# Patient Record
Sex: Female | Born: 1945 | Race: White | Hispanic: No | Marital: Married | Smoking: Former smoker
Health system: Southern US, Community
[De-identification: ages and names within clinical notes are randomized; demographics above are authoritative.]

## PROBLEM LIST (undated history)

## (undated) DIAGNOSIS — Z8719 Personal history of other diseases of the digestive system: Secondary | ICD-10-CM

## (undated) DIAGNOSIS — K219 Gastro-esophageal reflux disease without esophagitis: Secondary | ICD-10-CM

## (undated) DIAGNOSIS — F1010 Alcohol abuse, uncomplicated: Secondary | ICD-10-CM

## (undated) DIAGNOSIS — D689 Coagulation defect, unspecified: Secondary | ICD-10-CM

## (undated) DIAGNOSIS — K222 Esophageal obstruction: Secondary | ICD-10-CM

## (undated) DIAGNOSIS — K449 Diaphragmatic hernia without obstruction or gangrene: Secondary | ICD-10-CM

## (undated) DIAGNOSIS — I2699 Other pulmonary embolism without acute cor pulmonale: Secondary | ICD-10-CM

## (undated) DIAGNOSIS — I739 Peripheral vascular disease, unspecified: Secondary | ICD-10-CM

## (undated) DIAGNOSIS — Z9981 Dependence on supplemental oxygen: Secondary | ICD-10-CM

## (undated) DIAGNOSIS — F341 Dysthymic disorder: Secondary | ICD-10-CM

## (undated) DIAGNOSIS — K529 Noninfective gastroenteritis and colitis, unspecified: Secondary | ICD-10-CM

## (undated) DIAGNOSIS — I471 Supraventricular tachycardia: Secondary | ICD-10-CM

## (undated) DIAGNOSIS — C50919 Malignant neoplasm of unspecified site of unspecified female breast: Secondary | ICD-10-CM

## (undated) DIAGNOSIS — F419 Anxiety disorder, unspecified: Secondary | ICD-10-CM

## (undated) DIAGNOSIS — I82409 Acute embolism and thrombosis of unspecified deep veins of unspecified lower extremity: Secondary | ICD-10-CM

## (undated) DIAGNOSIS — I7090 Unspecified atherosclerosis: Secondary | ICD-10-CM

## (undated) DIAGNOSIS — J9610 Chronic respiratory failure, unspecified whether with hypoxia or hypercapnia: Secondary | ICD-10-CM

## (undated) DIAGNOSIS — E785 Hyperlipidemia, unspecified: Secondary | ICD-10-CM

## (undated) HISTORY — DX: Unspecified atherosclerosis: I70.90

## (undated) HISTORY — DX: Coagulation defect, unspecified: D68.9

## (undated) HISTORY — DX: Diaphragmatic hernia without obstruction or gangrene: K44.9

## (undated) HISTORY — DX: Dysthymic disorder: F34.1

## (undated) HISTORY — DX: Esophageal obstruction: K22.2

## (undated) HISTORY — DX: Noninfective gastroenteritis and colitis, unspecified: K52.9

## (undated) HISTORY — DX: Peripheral vascular disease, unspecified: I73.9

## (undated) HISTORY — PX: ESOPHAGOGASTRODUODENOSCOPY (EGD) WITH ESOPHAGEAL DILATION: SHX5812

## (undated) HISTORY — DX: Malignant neoplasm of unspecified site of unspecified female breast: C50.919

## (undated) HISTORY — DX: Alcohol abuse, uncomplicated: F10.10

## (undated) HISTORY — DX: Hyperlipidemia, unspecified: E78.5

## (undated) HISTORY — DX: Chronic respiratory failure, unspecified whether with hypoxia or hypercapnia: J96.10

---

## 1977-12-08 HISTORY — PX: APPENDECTOMY: SHX54

## 1977-12-08 HISTORY — PX: CHOLECYSTECTOMY: SHX55

## 1979-08-09 HISTORY — PX: TUBAL LIGATION: SHX77

## 1989-08-08 HISTORY — PX: CARDIAC CATHETERIZATION: SHX172

## 2000-09-24 ENCOUNTER — Ambulatory Visit (HOSPITAL_COMMUNITY): Admission: RE | Admit: 2000-09-24 | Discharge: 2000-09-24 | Payer: Self-pay | Admitting: *Deleted

## 2000-09-24 ENCOUNTER — Encounter: Payer: Self-pay | Admitting: *Deleted

## 2000-12-08 DIAGNOSIS — C50919 Malignant neoplasm of unspecified site of unspecified female breast: Secondary | ICD-10-CM

## 2000-12-08 HISTORY — DX: Malignant neoplasm of unspecified site of unspecified female breast: C50.919

## 2000-12-31 ENCOUNTER — Other Ambulatory Visit: Admission: RE | Admit: 2000-12-31 | Discharge: 2000-12-31 | Payer: Self-pay | Admitting: Radiology

## 2001-01-05 ENCOUNTER — Encounter: Admission: RE | Admit: 2001-01-05 | Discharge: 2001-04-05 | Payer: Self-pay | Admitting: Radiation Oncology

## 2001-01-08 ENCOUNTER — Ambulatory Visit (HOSPITAL_COMMUNITY): Admission: RE | Admit: 2001-01-08 | Discharge: 2001-01-08 | Payer: Self-pay | Admitting: Surgery

## 2001-01-08 ENCOUNTER — Encounter (INDEPENDENT_AMBULATORY_CARE_PROVIDER_SITE_OTHER): Payer: Self-pay

## 2001-01-08 HISTORY — PX: BREAST LUMPECTOMY: SHX2

## 2001-04-06 ENCOUNTER — Ambulatory Visit: Admission: RE | Admit: 2001-04-06 | Discharge: 2001-07-05 | Payer: Self-pay | Admitting: Radiation Oncology

## 2001-04-06 ENCOUNTER — Ambulatory Visit (HOSPITAL_COMMUNITY): Admission: RE | Admit: 2001-04-06 | Discharge: 2001-04-06 | Payer: Self-pay | Admitting: Radiation Oncology

## 2002-04-20 ENCOUNTER — Emergency Department (HOSPITAL_COMMUNITY): Admission: EM | Admit: 2002-04-20 | Discharge: 2002-04-21 | Payer: Self-pay | Admitting: *Deleted

## 2002-11-07 ENCOUNTER — Other Ambulatory Visit: Admission: RE | Admit: 2002-11-07 | Discharge: 2002-11-07 | Payer: Self-pay | Admitting: Surgery

## 2002-11-29 ENCOUNTER — Encounter: Payer: Self-pay | Admitting: Otolaryngology

## 2002-11-29 ENCOUNTER — Ambulatory Visit (HOSPITAL_COMMUNITY): Admission: RE | Admit: 2002-11-29 | Discharge: 2002-11-29 | Payer: Self-pay | Admitting: Otolaryngology

## 2003-04-29 ENCOUNTER — Emergency Department (HOSPITAL_COMMUNITY): Admission: EM | Admit: 2003-04-29 | Discharge: 2003-04-29 | Payer: Self-pay | Admitting: Emergency Medicine

## 2003-09-12 ENCOUNTER — Ambulatory Visit (HOSPITAL_COMMUNITY): Admission: RE | Admit: 2003-09-12 | Discharge: 2003-09-12 | Payer: Self-pay | Admitting: Gastroenterology

## 2003-09-12 ENCOUNTER — Encounter: Payer: Self-pay | Admitting: Gastroenterology

## 2004-03-15 ENCOUNTER — Ambulatory Visit (HOSPITAL_BASED_OUTPATIENT_CLINIC_OR_DEPARTMENT_OTHER): Admission: RE | Admit: 2004-03-15 | Discharge: 2004-03-15 | Payer: Self-pay | Admitting: Otolaryngology

## 2004-03-15 ENCOUNTER — Ambulatory Visit (HOSPITAL_COMMUNITY): Admission: RE | Admit: 2004-03-15 | Discharge: 2004-03-15 | Payer: Self-pay | Admitting: Otolaryngology

## 2004-03-15 ENCOUNTER — Encounter (INDEPENDENT_AMBULATORY_CARE_PROVIDER_SITE_OTHER): Payer: Self-pay | Admitting: Specialist

## 2004-10-09 ENCOUNTER — Ambulatory Visit: Payer: Self-pay | Admitting: Oncology

## 2005-04-18 ENCOUNTER — Ambulatory Visit: Payer: Self-pay | Admitting: Internal Medicine

## 2005-07-10 ENCOUNTER — Emergency Department (HOSPITAL_COMMUNITY): Admission: EM | Admit: 2005-07-10 | Discharge: 2005-07-11 | Payer: Self-pay | Admitting: Emergency Medicine

## 2005-08-04 ENCOUNTER — Ambulatory Visit: Payer: Self-pay | Admitting: Gastroenterology

## 2005-08-06 ENCOUNTER — Ambulatory Visit (HOSPITAL_COMMUNITY): Admission: RE | Admit: 2005-08-06 | Discharge: 2005-08-06 | Payer: Self-pay | Admitting: Gastroenterology

## 2005-09-05 ENCOUNTER — Ambulatory Visit: Payer: Self-pay | Admitting: Gastroenterology

## 2005-09-12 ENCOUNTER — Ambulatory Visit (HOSPITAL_COMMUNITY): Admission: RE | Admit: 2005-09-12 | Discharge: 2005-09-12 | Payer: Self-pay | Admitting: Gastroenterology

## 2005-09-12 ENCOUNTER — Ambulatory Visit: Payer: Self-pay | Admitting: Gastroenterology

## 2005-09-12 ENCOUNTER — Encounter: Payer: Self-pay | Admitting: Gastroenterology

## 2005-09-12 ENCOUNTER — Encounter (INDEPENDENT_AMBULATORY_CARE_PROVIDER_SITE_OTHER): Payer: Self-pay | Admitting: Specialist

## 2005-10-23 ENCOUNTER — Ambulatory Visit: Payer: Self-pay | Admitting: Internal Medicine

## 2006-01-15 ENCOUNTER — Ambulatory Visit: Admission: RE | Admit: 2006-01-15 | Discharge: 2006-01-15 | Payer: Self-pay | Admitting: Internal Medicine

## 2006-04-17 ENCOUNTER — Ambulatory Visit: Payer: Self-pay | Admitting: Internal Medicine

## 2006-04-28 LAB — COMPREHENSIVE METABOLIC PANEL
Albumin: 4.1 g/dL (ref 3.5–5.2)
Alkaline Phosphatase: 84 U/L (ref 39–117)
BUN: 11 mg/dL (ref 6–23)
Calcium: 9.2 mg/dL (ref 8.4–10.5)
Chloride: 104 mEq/L (ref 96–112)
Glucose, Bld: 125 mg/dL — ABNORMAL HIGH (ref 70–99)
Potassium: 3.7 mEq/L (ref 3.5–5.3)

## 2006-04-28 LAB — CBC WITH DIFFERENTIAL/PLATELET
Basophils Absolute: 0.1 10*3/uL (ref 0.0–0.1)
EOS%: 2.4 % (ref 0.0–7.0)
HCT: 38.3 % (ref 34.8–46.6)
HGB: 13 g/dL (ref 11.6–15.9)
MCH: 30.1 pg (ref 26.0–34.0)
NEUT%: 52.2 % (ref 39.6–76.8)
Platelets: 322 10*3/uL (ref 145–400)
lymph#: 2.8 10*3/uL (ref 0.9–3.3)

## 2006-10-16 ENCOUNTER — Ambulatory Visit: Payer: Self-pay | Admitting: Internal Medicine

## 2006-10-26 LAB — COMPREHENSIVE METABOLIC PANEL
ALT: 39 U/L — ABNORMAL HIGH (ref 0–35)
AST: 38 U/L — ABNORMAL HIGH (ref 0–37)
Alkaline Phosphatase: 77 U/L (ref 39–117)
CO2: 23 mEq/L (ref 19–32)
Creatinine, Ser: 0.78 mg/dL (ref 0.40–1.20)
Total Bilirubin: 0.3 mg/dL (ref 0.3–1.2)

## 2006-10-26 LAB — CBC WITH DIFFERENTIAL/PLATELET
BASO%: 0.3 % (ref 0.0–2.0)
LYMPH%: 33.3 % (ref 14.0–48.0)
MCHC: 34.2 g/dL (ref 32.0–36.0)
MONO#: 0.7 10*3/uL (ref 0.1–0.9)
Platelets: 339 10*3/uL (ref 145–400)
RBC: 4.24 10*6/uL (ref 3.70–5.32)
WBC: 9.3 10*3/uL (ref 3.9–10.0)

## 2006-10-26 LAB — CANCER ANTIGEN 27.29: CA 27.29: 31 U/mL (ref 0–39)

## 2007-04-16 ENCOUNTER — Ambulatory Visit: Payer: Self-pay | Admitting: Internal Medicine

## 2007-04-26 ENCOUNTER — Ambulatory Visit (HOSPITAL_COMMUNITY): Admission: RE | Admit: 2007-04-26 | Discharge: 2007-04-26 | Payer: Self-pay | Admitting: Internal Medicine

## 2007-04-26 LAB — COMPREHENSIVE METABOLIC PANEL
ALT: 29 U/L (ref 0–35)
Albumin: 4.3 g/dL (ref 3.5–5.2)
CO2: 22 mEq/L (ref 19–32)
Chloride: 104 mEq/L (ref 96–112)
Glucose, Bld: 122 mg/dL — ABNORMAL HIGH (ref 70–99)
Potassium: 3.3 mEq/L — ABNORMAL LOW (ref 3.5–5.3)
Sodium: 142 mEq/L (ref 135–145)
Total Bilirubin: 0.3 mg/dL (ref 0.3–1.2)
Total Protein: 7.4 g/dL (ref 6.0–8.3)

## 2007-04-26 LAB — LACTATE DEHYDROGENASE: LDH: 168 U/L (ref 94–250)

## 2007-04-26 LAB — CBC WITH DIFFERENTIAL/PLATELET
Basophils Absolute: 0 10*3/uL (ref 0.0–0.1)
Eosinophils Absolute: 0.2 10*3/uL (ref 0.0–0.5)
HGB: 13.5 g/dL (ref 11.6–15.9)
MCV: 87.9 fL (ref 81.0–101.0)
MONO#: 0.7 10*3/uL (ref 0.1–0.9)
MONO%: 7.3 % (ref 0.0–13.0)
NEUT#: 5.4 10*3/uL (ref 1.5–6.5)
RBC: 4.47 10*6/uL (ref 3.70–5.32)
RDW: 13.5 % (ref 11.3–14.5)
WBC: 8.9 10*3/uL (ref 3.9–10.0)
lymph#: 2.6 10*3/uL (ref 0.9–3.3)

## 2007-04-26 LAB — CANCER ANTIGEN 27.29: CA 27.29: 48 U/mL — ABNORMAL HIGH (ref 0–39)

## 2007-04-29 ENCOUNTER — Ambulatory Visit (HOSPITAL_COMMUNITY): Admission: RE | Admit: 2007-04-29 | Discharge: 2007-04-29 | Payer: Self-pay | Admitting: Internal Medicine

## 2007-09-30 ENCOUNTER — Ambulatory Visit: Payer: Self-pay | Admitting: Internal Medicine

## 2007-10-04 ENCOUNTER — Ambulatory Visit (HOSPITAL_COMMUNITY): Admission: RE | Admit: 2007-10-04 | Discharge: 2007-10-04 | Payer: Self-pay | Admitting: Internal Medicine

## 2007-10-04 LAB — CBC WITH DIFFERENTIAL/PLATELET
BASO%: 0.4 % (ref 0.0–2.0)
EOS%: 1.8 % (ref 0.0–7.0)
HCT: 39.2 % (ref 34.8–46.6)
LYMPH%: 36 % (ref 14.0–48.0)
MCH: 30.9 pg (ref 26.0–34.0)
MCHC: 35 g/dL (ref 32.0–36.0)
MONO#: 0.7 10*3/uL (ref 0.1–0.9)
NEUT%: 54.7 % (ref 39.6–76.8)
RBC: 4.44 10*6/uL (ref 3.70–5.32)
WBC: 9.2 10*3/uL (ref 3.9–10.0)
lymph#: 3.3 10*3/uL (ref 0.9–3.3)

## 2007-10-04 LAB — COMPREHENSIVE METABOLIC PANEL
ALT: 29 U/L (ref 0–35)
BUN: 11 mg/dL (ref 6–23)
CO2: 24 mEq/L (ref 19–32)
Calcium: 9.3 mg/dL (ref 8.4–10.5)
Chloride: 104 mEq/L (ref 96–112)
Creatinine, Ser: 0.71 mg/dL (ref 0.40–1.20)
Total Bilirubin: 0.4 mg/dL (ref 0.3–1.2)

## 2007-10-04 LAB — LACTATE DEHYDROGENASE: LDH: 174 U/L (ref 94–250)

## 2008-01-25 ENCOUNTER — Ambulatory Visit: Payer: Self-pay | Admitting: Vascular Surgery

## 2008-04-10 ENCOUNTER — Ambulatory Visit: Payer: Self-pay | Admitting: Internal Medicine

## 2008-04-18 LAB — COMPREHENSIVE METABOLIC PANEL
Albumin: 3.9 g/dL (ref 3.5–5.2)
Alkaline Phosphatase: 73 U/L (ref 39–117)
CO2: 23 mEq/L (ref 19–32)
Chloride: 106 mEq/L (ref 96–112)
Glucose, Bld: 104 mg/dL — ABNORMAL HIGH (ref 70–99)
Potassium: 3.7 mEq/L (ref 3.5–5.3)
Sodium: 143 mEq/L (ref 135–145)
Total Protein: 7.6 g/dL (ref 6.0–8.3)

## 2008-04-18 LAB — CBC WITH DIFFERENTIAL/PLATELET
Eosinophils Absolute: 0.2 10*3/uL (ref 0.0–0.5)
MONO#: 0.6 10*3/uL (ref 0.1–0.9)
NEUT#: 4.4 10*3/uL (ref 1.5–6.5)
RBC: 4.52 10*6/uL (ref 3.70–5.32)
RDW: 14.6 % — ABNORMAL HIGH (ref 11.3–14.5)
WBC: 7.9 10*3/uL (ref 3.9–10.0)

## 2008-04-18 LAB — CANCER ANTIGEN 27.29: CA 27.29: 50 U/mL — ABNORMAL HIGH (ref 0–39)

## 2008-08-08 ENCOUNTER — Ambulatory Visit: Payer: Self-pay | Admitting: Vascular Surgery

## 2008-09-20 ENCOUNTER — Encounter
Admission: RE | Admit: 2008-09-20 | Discharge: 2008-09-20 | Payer: Self-pay | Admitting: Physical Medicine and Rehabilitation

## 2008-10-10 ENCOUNTER — Ambulatory Visit: Payer: Self-pay | Admitting: Internal Medicine

## 2008-10-19 ENCOUNTER — Ambulatory Visit (HOSPITAL_COMMUNITY): Admission: RE | Admit: 2008-10-19 | Discharge: 2008-10-19 | Payer: Self-pay | Admitting: Internal Medicine

## 2008-10-19 LAB — COMPREHENSIVE METABOLIC PANEL
ALT: 14 U/L (ref 0–35)
CO2: 25 mEq/L (ref 19–32)
Calcium: 9.4 mg/dL (ref 8.4–10.5)
Chloride: 106 mEq/L (ref 96–112)
Potassium: 4.2 mEq/L (ref 3.5–5.3)
Sodium: 141 mEq/L (ref 135–145)
Total Protein: 7.1 g/dL (ref 6.0–8.3)

## 2008-10-19 LAB — CBC WITH DIFFERENTIAL/PLATELET
BASO%: 0.9 % (ref 0.0–2.0)
HCT: 41 % (ref 34.8–46.6)
MCHC: 33.7 g/dL (ref 32.0–36.0)
MONO#: 0.8 10*3/uL (ref 0.1–0.9)
RBC: 4.51 10*6/uL (ref 3.70–5.32)
RDW: 14 % (ref 11.3–14.5)
WBC: 11 10*3/uL — ABNORMAL HIGH (ref 3.9–10.0)
lymph#: 3.4 10*3/uL — ABNORMAL HIGH (ref 0.9–3.3)

## 2009-02-05 ENCOUNTER — Telehealth (INDEPENDENT_AMBULATORY_CARE_PROVIDER_SITE_OTHER): Payer: Self-pay | Admitting: Gastroenterology

## 2009-02-05 ENCOUNTER — Ambulatory Visit: Payer: Self-pay | Admitting: Gastroenterology

## 2009-02-05 DIAGNOSIS — D689 Coagulation defect, unspecified: Secondary | ICD-10-CM

## 2009-02-05 DIAGNOSIS — R1319 Other dysphagia: Secondary | ICD-10-CM

## 2009-02-05 DIAGNOSIS — K222 Esophageal obstruction: Secondary | ICD-10-CM | POA: Insufficient documentation

## 2009-02-05 DIAGNOSIS — R110 Nausea: Secondary | ICD-10-CM

## 2009-02-05 DIAGNOSIS — E785 Hyperlipidemia, unspecified: Secondary | ICD-10-CM | POA: Insufficient documentation

## 2009-02-05 DIAGNOSIS — K449 Diaphragmatic hernia without obstruction or gangrene: Secondary | ICD-10-CM | POA: Insufficient documentation

## 2009-02-05 DIAGNOSIS — R197 Diarrhea, unspecified: Secondary | ICD-10-CM

## 2009-02-05 DIAGNOSIS — C50919 Malignant neoplasm of unspecified site of unspecified female breast: Secondary | ICD-10-CM | POA: Insufficient documentation

## 2009-02-05 DIAGNOSIS — K219 Gastro-esophageal reflux disease without esophagitis: Secondary | ICD-10-CM

## 2009-02-05 DIAGNOSIS — F341 Dysthymic disorder: Secondary | ICD-10-CM | POA: Insufficient documentation

## 2009-02-06 ENCOUNTER — Encounter: Payer: Self-pay | Admitting: Nurse Practitioner

## 2009-02-06 LAB — CONVERTED CEMR LAB
BUN: 11 mg/dL (ref 6–23)
CO2: 29 meq/L (ref 19–32)
Eosinophils Absolute: 0.2 10*3/uL (ref 0.0–0.7)
GFR calc Af Amer: 81 mL/min
GFR calc non Af Amer: 67 mL/min
Glucose, Bld: 102 mg/dL — ABNORMAL HIGH (ref 70–99)
HCT: 43 % (ref 36.0–46.0)
Lymphocytes Relative: 30.2 % (ref 12.0–46.0)
MCHC: 34.6 g/dL (ref 30.0–36.0)
MCV: 90.5 fL (ref 78.0–100.0)
Monocytes Absolute: 1.5 10*3/uL — ABNORMAL HIGH (ref 0.1–1.0)
Monocytes Relative: 12.3 % — ABNORMAL HIGH (ref 3.0–12.0)
Neutrophils Relative %: 56.2 % (ref 43.0–77.0)
RBC: 4.75 M/uL (ref 3.87–5.11)
WBC: 11.8 10*3/uL — ABNORMAL HIGH (ref 4.5–10.5)

## 2009-02-07 ENCOUNTER — Encounter: Payer: Self-pay | Admitting: Nurse Practitioner

## 2009-02-07 ENCOUNTER — Telehealth: Payer: Self-pay | Admitting: Nurse Practitioner

## 2009-03-07 ENCOUNTER — Ambulatory Visit: Payer: Self-pay | Admitting: Gastroenterology

## 2009-03-14 ENCOUNTER — Ambulatory Visit: Payer: Self-pay | Admitting: Gastroenterology

## 2009-03-14 ENCOUNTER — Encounter: Payer: Self-pay | Admitting: Gastroenterology

## 2009-03-21 ENCOUNTER — Telehealth: Payer: Self-pay | Admitting: Gastroenterology

## 2009-03-21 ENCOUNTER — Encounter: Payer: Self-pay | Admitting: Gastroenterology

## 2009-04-03 ENCOUNTER — Encounter: Payer: Self-pay | Admitting: Gastroenterology

## 2009-04-04 ENCOUNTER — Ambulatory Visit: Payer: Self-pay | Admitting: Gastroenterology

## 2009-04-04 ENCOUNTER — Ambulatory Visit (HOSPITAL_COMMUNITY): Admission: RE | Admit: 2009-04-04 | Discharge: 2009-04-04 | Payer: Self-pay | Admitting: Gastroenterology

## 2009-04-05 ENCOUNTER — Telehealth: Payer: Self-pay | Admitting: Gastroenterology

## 2009-04-10 ENCOUNTER — Telehealth: Payer: Self-pay | Admitting: Gastroenterology

## 2009-04-18 ENCOUNTER — Ambulatory Visit (HOSPITAL_COMMUNITY): Admission: RE | Admit: 2009-04-18 | Discharge: 2009-04-18 | Payer: Self-pay | Admitting: Gastroenterology

## 2009-04-18 ENCOUNTER — Encounter: Payer: Self-pay | Admitting: Gastroenterology

## 2009-04-26 ENCOUNTER — Ambulatory Visit: Payer: Self-pay | Admitting: Internal Medicine

## 2009-04-30 LAB — CBC WITH DIFFERENTIAL/PLATELET
BASO%: 0.6 % (ref 0.0–2.0)
LYMPH%: 34.5 % (ref 14.0–49.7)
MCHC: 33.2 g/dL (ref 31.5–36.0)
MONO#: 0.8 10*3/uL (ref 0.1–0.9)
RBC: 4.75 10*6/uL (ref 3.70–5.45)
RDW: 14.3 % (ref 11.2–14.5)
WBC: 9.6 10*3/uL (ref 3.9–10.3)
lymph#: 3.3 10*3/uL (ref 0.9–3.3)

## 2009-04-30 LAB — COMPREHENSIVE METABOLIC PANEL
AST: 32 U/L (ref 0–37)
Albumin: 3.3 g/dL — ABNORMAL LOW (ref 3.5–5.2)
Alkaline Phosphatase: 72 U/L (ref 39–117)
Potassium: 2.8 mEq/L — ABNORMAL LOW (ref 3.5–5.3)
Sodium: 142 mEq/L (ref 135–145)
Total Protein: 6.8 g/dL (ref 6.0–8.3)

## 2009-05-03 ENCOUNTER — Ambulatory Visit: Payer: Self-pay | Admitting: Gastroenterology

## 2009-05-03 DIAGNOSIS — K5150 Left sided colitis without complications: Secondary | ICD-10-CM | POA: Insufficient documentation

## 2009-05-03 LAB — CBC WITH DIFFERENTIAL/PLATELET
Eosinophils Absolute: 0.2 10*3/uL (ref 0.0–0.5)
LYMPH%: 32.4 % (ref 14.0–49.7)
MCHC: 33.3 g/dL (ref 31.5–36.0)
MCV: 91.8 fL (ref 79.5–101.0)
MONO%: 6.7 % (ref 0.0–14.0)
NEUT#: 5.5 10*3/uL (ref 1.5–6.5)
Platelets: 293 10*3/uL (ref 145–400)
RBC: 4.64 10*6/uL (ref 3.70–5.45)

## 2009-05-03 LAB — BASIC METABOLIC PANEL
Calcium: 9 mg/dL (ref 8.4–10.5)
Creatinine, Ser: 0.69 mg/dL (ref 0.40–1.20)
Glucose, Bld: 106 mg/dL — ABNORMAL HIGH (ref 70–99)
Sodium: 138 mEq/L (ref 135–145)

## 2009-05-31 ENCOUNTER — Telehealth: Payer: Self-pay | Admitting: Gastroenterology

## 2009-06-06 ENCOUNTER — Telehealth: Payer: Self-pay | Admitting: Gastroenterology

## 2009-07-02 ENCOUNTER — Encounter: Admission: RE | Admit: 2009-07-02 | Discharge: 2009-07-02 | Payer: Self-pay | Admitting: Family Medicine

## 2009-07-24 ENCOUNTER — Ambulatory Visit: Payer: Self-pay | Admitting: Internal Medicine

## 2009-07-26 LAB — COMPREHENSIVE METABOLIC PANEL
Albumin: 3.8 g/dL (ref 3.5–5.2)
CO2: 28 mEq/L (ref 19–32)
Calcium: 9.2 mg/dL (ref 8.4–10.5)
Glucose, Bld: 112 mg/dL — ABNORMAL HIGH (ref 70–99)
Potassium: 2.8 mEq/L — ABNORMAL LOW (ref 3.5–5.3)
Sodium: 141 mEq/L (ref 135–145)
Total Protein: 6.9 g/dL (ref 6.0–8.3)

## 2009-07-26 LAB — CBC WITH DIFFERENTIAL/PLATELET
Basophils Absolute: 0.1 10*3/uL (ref 0.0–0.1)
Eosinophils Absolute: 0.2 10*3/uL (ref 0.0–0.5)
HGB: 13.5 g/dL (ref 11.6–15.9)
MONO#: 0.5 10*3/uL (ref 0.1–0.9)
NEUT#: 6.4 10*3/uL (ref 1.5–6.5)
Platelets: 247 10*3/uL (ref 145–400)
RBC: 4.34 10*6/uL (ref 3.70–5.45)
RDW: 14 % (ref 11.2–14.5)
WBC: 10 10*3/uL (ref 3.9–10.3)

## 2009-07-26 LAB — CANCER ANTIGEN 27.29: CA 27.29: 35 U/mL (ref 0–39)

## 2009-08-02 ENCOUNTER — Encounter: Payer: Self-pay | Admitting: Gastroenterology

## 2009-09-06 ENCOUNTER — Ambulatory Visit: Payer: Self-pay | Admitting: Vascular Surgery

## 2009-09-12 ENCOUNTER — Telehealth: Payer: Self-pay | Admitting: Gastroenterology

## 2010-01-25 ENCOUNTER — Ambulatory Visit: Payer: Self-pay | Admitting: Internal Medicine

## 2010-01-29 LAB — COMPREHENSIVE METABOLIC PANEL
ALT: 36 U/L — ABNORMAL HIGH (ref 0–35)
AST: 43 U/L — ABNORMAL HIGH (ref 0–37)
CO2: 25 mEq/L (ref 19–32)
Calcium: 8.6 mg/dL (ref 8.4–10.5)
Chloride: 107 mEq/L (ref 96–112)
Sodium: 138 mEq/L (ref 135–145)
Total Bilirubin: 0.8 mg/dL (ref 0.3–1.2)
Total Protein: 7.1 g/dL (ref 6.0–8.3)

## 2010-01-29 LAB — CBC WITH DIFFERENTIAL/PLATELET
BASO%: 0.2 % (ref 0.0–2.0)
EOS%: 2 % (ref 0.0–7.0)
MCHC: 33.9 g/dL (ref 31.5–36.0)
MONO#: 0.6 10*3/uL (ref 0.1–0.9)
RBC: 4.28 10*6/uL (ref 3.70–5.45)
WBC: 8.4 10*3/uL (ref 3.9–10.3)
lymph#: 2.6 10*3/uL (ref 0.9–3.3)

## 2010-01-29 LAB — CANCER ANTIGEN 27.29: CA 27.29: 47 U/mL — ABNORMAL HIGH (ref 0–39)

## 2010-01-31 ENCOUNTER — Encounter: Payer: Self-pay | Admitting: Gastroenterology

## 2010-02-23 ENCOUNTER — Emergency Department (HOSPITAL_COMMUNITY): Admission: EM | Admit: 2010-02-23 | Discharge: 2010-02-23 | Payer: Self-pay | Admitting: Emergency Medicine

## 2010-06-18 ENCOUNTER — Telehealth: Payer: Self-pay | Admitting: Gastroenterology

## 2010-06-20 ENCOUNTER — Encounter: Payer: Self-pay | Admitting: Gastroenterology

## 2010-06-20 ENCOUNTER — Ambulatory Visit: Payer: Self-pay | Admitting: Internal Medicine

## 2010-06-20 DIAGNOSIS — R112 Nausea with vomiting, unspecified: Secondary | ICD-10-CM

## 2010-06-20 DIAGNOSIS — K5289 Other specified noninfective gastroenteritis and colitis: Secondary | ICD-10-CM | POA: Insufficient documentation

## 2010-06-20 DIAGNOSIS — R1013 Epigastric pain: Secondary | ICD-10-CM

## 2010-06-20 DIAGNOSIS — I739 Peripheral vascular disease, unspecified: Secondary | ICD-10-CM

## 2010-06-21 ENCOUNTER — Encounter: Payer: Self-pay | Admitting: Gastroenterology

## 2010-06-21 LAB — CONVERTED CEMR LAB
Albumin: 4 g/dL (ref 3.5–5.2)
Alkaline Phosphatase: 86 units/L (ref 39–117)
BUN: 10 mg/dL (ref 6–23)
Basophils Absolute: 0.1 10*3/uL (ref 0.0–0.1)
Creatinine, Ser: 0.6 mg/dL (ref 0.4–1.2)
Eosinophils Absolute: 0.2 10*3/uL (ref 0.0–0.7)
GFR calc non Af Amer: 104.68 mL/min (ref >60)
Lipase: 27 units/L (ref 11.0–59.0)
Monocytes Absolute: 0.7 10*3/uL (ref 0.1–1.0)
Monocytes Relative: 6.1 % (ref 3.0–12.0)
Neutro Abs: 6.9 10*3/uL (ref 1.4–7.7)
Neutrophils Relative %: 63.7 % (ref 43.0–77.0)
RDW: 14.1 % (ref 11.5–14.6)
Total Bilirubin: 0.6 mg/dL (ref 0.3–1.2)

## 2010-07-24 ENCOUNTER — Ambulatory Visit: Payer: Self-pay | Admitting: Internal Medicine

## 2010-07-26 ENCOUNTER — Ambulatory Visit (HOSPITAL_COMMUNITY): Admission: RE | Admit: 2010-07-26 | Discharge: 2010-07-26 | Payer: Self-pay | Admitting: Internal Medicine

## 2010-07-26 LAB — COMPREHENSIVE METABOLIC PANEL
Albumin: 3.9 g/dL (ref 3.5–5.2)
CO2: 28 mEq/L (ref 19–32)
Calcium: 9.5 mg/dL (ref 8.4–10.5)
Chloride: 103 mEq/L (ref 96–112)
Glucose, Bld: 110 mg/dL — ABNORMAL HIGH (ref 70–99)
Potassium: 3.3 mEq/L — ABNORMAL LOW (ref 3.5–5.3)
Sodium: 143 mEq/L (ref 135–145)
Total Protein: 6.8 g/dL (ref 6.0–8.3)

## 2010-07-26 LAB — CBC WITH DIFFERENTIAL/PLATELET
Eosinophils Absolute: 0.1 10*3/uL (ref 0.0–0.5)
LYMPH%: 20.3 % (ref 14.0–49.7)
MONO#: 0.8 10*3/uL (ref 0.1–0.9)
NEUT#: 6.5 10*3/uL (ref 1.5–6.5)
Platelets: 259 10*3/uL (ref 145–400)
RBC: 4.54 10*6/uL (ref 3.70–5.45)
RDW: 14.4 % (ref 11.2–14.5)
WBC: 9.4 10*3/uL (ref 3.9–10.3)

## 2010-07-26 LAB — CANCER ANTIGEN 27.29: CA 27.29: 32 U/mL (ref 0–39)

## 2010-07-31 ENCOUNTER — Encounter: Payer: Self-pay | Admitting: Gastroenterology

## 2010-09-24 ENCOUNTER — Ambulatory Visit: Payer: Self-pay | Admitting: Gastroenterology

## 2010-10-02 ENCOUNTER — Telehealth: Payer: Self-pay | Admitting: Gastroenterology

## 2010-10-07 ENCOUNTER — Ambulatory Visit (HOSPITAL_COMMUNITY)
Admission: RE | Admit: 2010-10-07 | Discharge: 2010-10-07 | Payer: Self-pay | Source: Home / Self Care | Admitting: Gastroenterology

## 2010-10-07 ENCOUNTER — Ambulatory Visit: Payer: Self-pay | Admitting: Gastroenterology

## 2010-11-04 ENCOUNTER — Telehealth: Payer: Self-pay | Admitting: Gastroenterology

## 2010-11-20 ENCOUNTER — Ambulatory Visit: Payer: Self-pay | Admitting: Vascular Surgery

## 2010-12-10 ENCOUNTER — Telehealth: Payer: Self-pay | Admitting: Gastroenterology

## 2010-12-28 ENCOUNTER — Encounter: Payer: Self-pay | Admitting: Gastroenterology

## 2010-12-29 ENCOUNTER — Encounter: Payer: Self-pay | Admitting: Emergency Medicine

## 2010-12-30 ENCOUNTER — Encounter: Payer: Self-pay | Admitting: Gastroenterology

## 2010-12-30 ENCOUNTER — Ambulatory Visit (HOSPITAL_COMMUNITY)
Admission: RE | Admit: 2010-12-30 | Discharge: 2010-12-30 | Payer: Self-pay | Source: Home / Self Care | Attending: Gastroenterology | Admitting: Gastroenterology

## 2010-12-31 ENCOUNTER — Encounter: Payer: Self-pay | Admitting: Gastroenterology

## 2011-01-06 ENCOUNTER — Telehealth: Payer: Self-pay | Admitting: Gastroenterology

## 2011-01-09 NOTE — Progress Notes (Signed)
Summary: rx concern  Phone Note Call from Patient Call back at Home Phone [phone]   Caller: Patient Call For: Dr. Arlyce Dice Reason for Call: Talk to Nurse Summary of Call: ins wont cover enema rx... would like something that her ins will cover or is less expensive Initial call taken by: Vallarie Mare,  October 02, 2010 1:14 PM  Follow-up for Phone Call        Dr Arlyce Dice, Pts insurance will not cover Rowasa, what can she sub for?? Follow-up by: Merri Ray CMA Duncan Dull),  October 02, 2010 1:17 PM  Additional Follow-up for Phone Call Additional follow up Details #1::        try cort enemas 1 qhs Additional Follow-up by: Louis Meckel MD,  October 02, 2010 4:35 PM    Additional Follow-up for Phone Call Additional follow up Details #2::    pt aware and sent to pharmacy Follow-up by: Chales Abrahams CMA Duncan Dull),  October 02, 2010 4:48 PM  New/Updated Medications: CORTENEMA 100 MG/60ML ENEM (HYDROCORTISONE) use rectally at bedtime Prescriptions: CORTENEMA 100 MG/60ML ENEM (HYDROCORTISONE) use rectally at bedtime  #1 month x 3   Entered by:   Chales Abrahams CMA (AAMA)   Authorized by:   Louis Meckel MD   Signed by:   Chales Abrahams CMA (AAMA) on 10/02/2010   Method used:   Electronically to        [facility]. [phone]* (retail)       [phone] [address].       [place], [place]  [address]       Ph: [phone]       Fax: [phone]   RxID:   [id number]

## 2011-01-09 NOTE — Letter (Signed)
Summary: EGD Instructions  [facility]  [address] [place], [place] [address]   Phone: [phone]  Fax: [phone]       KELBIE MORO    1946/03/20    MRN: [id number]       Procedure Day /Date:11/06/10     Arrival Time: 10:30 AM     Procedure Time:12:30 PM     Location of Procedure:                     _  X_ [facility] ( Outpatient Registration)   PREPARATION FOR ENDOSCOPY   On 11/06/10 THE DAY OF THE PROCEDURE:  1.   No solid foods, milk or milk products are allowed after midnight the night before your procedure.  2.   Do not drink anything colored red or purple.  Avoid juices with pulp.  No orange juice.  3.  You may drink clear liquids until 8:30 AM, which is 4 hours before your procedure.                                                                                                CLEAR LIQUIDS INCLUDE: Water Jello Ice Popsicles Tea (sugar ok, no milk/cream) Powdered fruit flavored drinks Coffee (sugar ok, no milk/cream) Gatorade Juice: apple, white grape, white cranberry  Lemonade Clear bullion, consomm, broth Carbonated beverages (any kind) Strained chicken noodle soup Hard Candy   MEDICATION INSTRUCTIONS  Unless otherwise instructed, you should take regular prescription medications with a small sip of water as early as possible the morning of your procedure.  Diabetic patients - see separate instructions.  Stop taking Plavix or Aggrenox on  10/30/10 (7 days before procedure).      Additional medication instructions: _             OTHER INSTRUCTIONS  You will need a responsible adult at least 66 years of age to accompany you and drive you home.   This person must remain in the waiting room during your procedure.  Wear loose fitting clothing that is easily removed.  Leave jewelry and other valuables at home.  However, you may wish to bring a book to read or an iPod/MP3 player to listen to music as you wait  for your procedure to start.  Remove all body piercing jewelry and leave at home.  Total time from sign-in until discharge is approximately 2-3 hours.  You should go home directly after your procedure and rest.  You can resume normal activities the day after your procedure.  The day of your procedure you should not:   Drive   Make legal decisions   Operate machinery   Drink alcohol   Return to work  You will receive specific instructions about eating, activities and medications before you leave.    The above instructions have been reviewed and explained to me by   _______________________    I fully understand and can verbalize these instructions _____________________________ Date _________

## 2011-01-09 NOTE — Assessment & Plan Note (Signed)
Summary: COLITITS FLARE UP/YF   History of Present Illness Visit Type: Follow-up Visit Primary GI MD: Melvia Heaps MD [facility] Primary Provider: Duane Lope, MD Chief Complaint: ulcerative colitis, beginning of a flare History of Present Illness:   Theresa [name] has returned for evaluation of nausea and diarrhea.  She has been complaining of severe nausea over the past week.  This is worsened postprandially.  She has no appetite.  She was started on Prevacid about 2 weeks ago.  She also complains of upper midepigastric pain although she denies dysphagia.  She has a history of an esophageal stricture.  Diarrhea continues.  She has left-sided colitis but has not taken lialda because of difficulty swallowing the pill.  She had been taking omeprazole.   GI Review of Systems    Reports abdominal pain, acid reflux, bloating, loss of appetite, and  nausea.     Location of  Abdominal pain: epigastric area.    Denies belching, chest pain, dysphagia with liquids, dysphagia with solids, heartburn, vomiting, vomiting blood, weight loss, and  weight gain.      Reports diarrhea.     Denies anal fissure, black tarry stools, change in bowel habit, constipation, diverticulosis, fecal incontinence, heme positive stool, hemorrhoids, irritable bowel syndrome, jaundice, light color stool, liver problems, rectal bleeding, and  rectal pain.    Current Medications (verified): 1)  Aspirin 81 Mg Tbec (Aspirin) .[name].. 1 Tablet By Mouth Once Daily 2)  Prevacid 24hr 15 Mg Cpdr (Lansoprazole) .... Once Daily 3)  Sertraline Hcl 100 Mg Tabs (Sertraline Hcl) .... 1/2 Tablet By Mouth Once Daily 4)  Arimidex 1 Mg Tabs (Anastrozole) .[name].. 1 Tablet By Mouth Once Daily 5)  Plavix 75 Mg Tabs (Clopidogrel Bisulfate) .... Once Daily 6)  Zofran 4 Mg Tabs (Ondansetron Hcl) .... As Needed For Nausea  Allergies (verified): 1)  ! Sulfa  Past History:  Past Medical History: Reviewed history from 06/20/2010 and no changes  required. ESOPHAGEAL STRICTURE (ICD-530.3) HYPERLIPIDEMIA (ICD-272.4) ANXIETY DEPRESSION (ICD-300.4) OTHER AND UNSPECIFIED COAGULATION DEFECTS (ICD-286.9) Hx of ADENOCARCINOMA, BREAST (ICD-174.9) HIATAL HERNIA (ICD-553.3) PAD COLITS, LEFT SIDED PVD  Past Surgical History: Reviewed history from 03/07/2009 and no changes required. Cholecystectomy Tubal ligation Appendectomy Lumpectomy rt. breast  Family History: Reviewed history from 02/05/2009 and no changes required. No FH of Colon Cancer: Family History of Heart Disease: father  Social History: Reviewed history from 05/03/2009 and no changes required. Patient is a former smoker. Quit 8 years ago Alcohol Use - yes 2 drinks per night Daily Caffeine Use 6 per day Occupation: not employed Illicit Drug Use - no Married  Review of Systems  The patient denies allergy/sinus, anemia, anxiety-new, arthritis/joint pain, back pain, blood in urine, breast changes/lumps, change in vision, confusion, cough, coughing up blood, depression-new, fainting, fatigue, fever, headaches-new, hearing problems, heart murmur, heart rhythm changes, itching, menstrual pain, muscle pains/cramps, night sweats, nosebleeds, pregnancy symptoms, shortness of breath, skin rash, sleeping problems, sore throat, swelling of feet/legs, swollen lymph glands, thirst - excessive , urination - excessive , urination changes/pain, urine leakage, vision changes, and voice change.    Vital Signs:  Patient profile:   65 year old female Height:      63 inches Weight:      153.50 pounds BMI:     27.29 Temp:     98.8 degrees F oral Pulse rate:   92 / minute Pulse rhythm:   regular BP sitting:   114 / 78  (left arm) Cuff size:   regular  Vitals Entered By: June McMurray CMA Duncan Dull) (September 24, 2010 11:14 AM)   Impression & Recommendations:  Problem # 1:  NAUSEA AND VOMITING (ICD-787.01)  This could be related to Prevacid.  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   Will try her on Rowasa enemas  Problem # 3:  ESOPHAGEAL STRICTURE (ICD-530.3) Plan reevaluation by endoscopy with dilatation is indicated  Patient Instructions: 1)  Copy sent to : Antionette Char, MD 2)  Your EGD  with balloon is scheduled at [facility] on 10/07/2010 at 10am 3)  Upper Endoscopy with Dilatation brochure given.  4)  The medication list was reviewed and reconciled.  All changed / newly prescribed medications were explained.  A complete medication list was provided to the patient / caregiver. Prescriptions: ROWASA 4 GM KIT (MESALAMINE-CLEANSER) take 1 PR q.h.s.  #15 x 2   Entered by:   Merri Ray CMA (AAMA)   Authorized by:   Louis Meckel MD   Signed by:   Merri Ray CMA (AAMA) on 09/24/2010   Method used:   Electronically to        [facility]. [phone]* (retail)       [phone] [address].       [place], [place]  [address]       Ph: [phone]       Fax: [phone]   RxID:   [id number] ZOFRAN 4 MG TABS (ONDANSETRON HCL) as needed for nausea  #25 x 1   Entered by:   Merri Ray CMA (AAMA)   Authorized by:   Louis Meckel MD   Signed by:   Merri Ray CMA (AAMA) on 09/24/2010   Method used:   Electronically to        [facility]. [phone]* (retail)       [phone] [address].       [place], [place]  [address]       Ph: [phone]       Fax: [phone]   RxID:   [id number] ZOFRAN 4 MG TABS (ONDANSETRON HCL) as needed for nausea  #25 x 1   Entered and Authorized by:   Louis Meckel MD   Signed by:   Louis Meckel MD on 09/24/2010   Method used:   Historical   RxID:   [id number] ROWASA 4 GM KIT (MESALAMINE-CLEANSER) take 1 PR q.h.s.  #15 x  2   Entered and Authorized by:   Louis Meckel MD   Signed by:   Louis Meckel MD on 09/24/2010   Method used:   Historical   RxID:   [id number]

## 2011-01-09 NOTE — Procedures (Addendum)
Summary: Dilatation w/o Endoscopy  Patient: Theresa [name] Note: All result statuses are Final unless otherwise noted.  Tests: (1) Dilatation w/o Endoscopy (DIL)  DIL Dilatation w/o Endoscopy                             DONE (C)     [facility]     [address] [place], [place]  [address]           OPERATIVE PROCEDURE REPORT           PATIENT:  Theresa [name], Theresa [name]  MR#:  [id number]     BIRTHDATE:  1946-06-12, 64 yrs. old  GENDER:  female           ENDOSCOPIST:  Barbette Hair. Arlyce Dice, MD     ASSISTANT:           PROCEDURE DATE:  12/30/2010     PROCEDURE:  EGD with Balloon Dilitation           INDICATIONS:  dilation of esophageal stricture           MEDICATIONS:   Fentanyl 100 mcg, Versed 10 mg IV     TOPICAL ANESTHETIC:  Cetacaine Spray           Findings: 1) moderate stricture distal esophagus - 10mm scope     passed with moderate resistance     2) 4-5cm sliding hiatal hernia     3) remainder of EGD normal           DESCRIPTION OF PROCEDURE:   After the risks benefits and     alternatives of the procedure were thoroughly explained, informed     consent was obtained.  Dilatation was performed at the esophagus.           Dilator:  Balloon dialtion was performed.  Size(s):  09-18-11     Reststance:  moderate  Heme:  yes     Appearance:     Moderate resistance to passage of 10mm endososcope           COMPLICATIONS:  None           IMPRESSION: Moderately severe esophageal - s/p balloon dilitation           RECOMMENDATIONS: Repeat balloon dilitation 2 weeks     MAC for future     procedures           REPEAT EXAM:  2 weeks           cc: Dr. Duane Lope           ______________________________     Barbette Hair. Arlyce Dice, MD           CC:           n.     REVISED:  12/30/2010 01:00 PM     eSIGNED:   Barbette Hair. Kaplan at 12/30/2010 01:00 PM           Atira, [place], [id number]  Note: An exclamation mark (!) indicates a result that was not dispersed into  the flowsheet. Document Creation Date: 12/30/2010 1:00 PM _______________________________________________________________________  (1) Order result status: Final Collection or observation date-time: 12/30/2010 12:53 Requested date-time:  Receipt date-time:  Reported date-time:  Referring Physician:   Ordering Physician: Melvia Heaps [id number]) Specimen Source:  Source: Launa Grill Order Number: [id number] Lab site:

## 2011-01-09 NOTE — Letter (Signed)
Summary: EGD Instructions  [facility]  [address] [place], [place] [address]   Phone: [phone]  Fax: [phone]       Theresa [name]    1946/07/02    MRN: [id number]       Procedure Day /Date:01/14/11     Arrival Time: 11am     Procedure Time:12noon     Location of Procedure:                     [name]  _ [facility] ( Outpatient Registration)    PREPARATION FOR ENDOSCOPY   On 01/14/11 THE DAY OF THE PROCEDURE:  1.   No solid foods, milk or milk products are allowed after midnight the night before your procedure.  2.   Do not drink anything colored red or purple.  Avoid juices with pulp.  No orange juice.  3.  You may drink clear liquids until 8am, which is 4 hours before your procedure.                                                                                                CLEAR LIQUIDS INCLUDE: Water Jello Ice Popsicles Tea (sugar ok, no milk/cream) Powdered fruit flavored drinks Coffee (sugar ok, no milk/cream) Gatorade Juice: apple, white grape, white cranberry  Lemonade Clear bullion, consomm, broth Carbonated beverages (any kind) Strained chicken noodle soup Hard Candy   MEDICATION INSTRUCTIONS  Unless otherwise instructed, you should take regular prescription medications with a small sip of water as early as possible the morning of your procedure.  Diabetic patients - see separate instructions.  Stop taking Plavix or Aggrenox on  January 30th  (7 days before procedure).                OTHER INSTRUCTIONS  You will need a responsible adult at least 66 years of age to accompany you and drive you home.   This person must remain in the waiting room during your procedure.  Wear loose fitting clothing that is easily removed.  Leave jewelry and other valuables at home.  However, you may wish to bring a book to read or an iPod/MP3 player to listen to music as you wait for your procedure to start.  Remove all body  piercing jewelry and leave at home.  Total time from sign-in until discharge is approximately 2-3 hours.  You should go home directly after your procedure and rest.  You can resume normal activities the day after your procedure.  The day of your procedure you should not:   Drive   Make legal decisions   Operate machinery   Drink alcohol   Return to work  You will receive specific instructions about eating, activities and medications before you leave.    The above instructions have been reviewed and explained to me by   _______________________    I fully understand and can verbalize these instructions _____________________________ Date _________     Appended Document: EGD Instructions Letter is mailed to the patient's home address, patient aware of appointment date and time.

## 2011-01-09 NOTE — Letter (Signed)
Summary: Anticoagulation Modification Letter  [facility]  [address] [place], [place] [address]   Phone: [phone]  Fax: [phone]    June 20, 2010  Re:    Theresa [name] DOB:    Apr 10, 1946 MRN:    [id number]    Dear Dr. Nanetta Batty:  We have scheduled the above patient for an Endoscopy with Dr. Melvia Heaps. Our records show that  he/she is on anticoagulation therapy. Please advise as to how long the patient may come off their therapy of Plavix prior to the scheduled procedure(s) on 07-04-10.   Please fax back/or route the completed form to [facility] CMA  at [phone]. .  Thank you for your help with this matter.  Sincerely,    Dr. Melvia Heaps   Physician Recommendation:  Hold Plavix 7 days prior ________________  Hold Coumadin 5 days prior ____________  Other ______________________________

## 2011-01-09 NOTE — Procedures (Signed)
Summary: Instructions for procedure/[facility]  Instructions for procedure/[facility]   Imported By: Sherian Rein 09/30/2010 14:27:56  _____________________________________________________________________  External Attachment:    Type:   Image     Comment:   External Document

## 2011-01-09 NOTE — Progress Notes (Signed)
Summary: TRIAGE  Phone Note Call from Patient Call back at Home Phone [phone]   Caller: Patient Call For: Arlyce Dice Reason for Call: Talk to Nurse Summary of Call: Patient wants to be seen sooner than first available 9-12 for severe nausea, wt loss, unable to eat and vomitting for about three weeks. Initial call taken by: Tawni Levy,  June 18, 2010 3:45 PM  Follow-up for Phone Call        Message left for patient to callback ASAP, she may be seen tomorrow, but I need to discuss appt. time w/her. Follow-up by: Laureen Ochs LPN,  June 18, 2010 4:25 PM  Additional Follow-up for Phone Call Additional follow up Details #1::        Pt. c/o 3 weeks of "Feeling yucky"  Constant nausea,  has lost 6 pounds in 3 weeks, daily diarrhea, stools are watery and has 8-10 daily- but diarrhea has been ongoing since her gallbladder was removed 30 years. Denies dysphagia, bloating, sour stomache, constipation, blood, black stools, fever. Pt. will see Willette Cluster NP on 06-20-10 at 1:30pm. Additional Follow-up by: Laureen Ochs LPN,  June 19, 2010 9:35 AM

## 2011-01-09 NOTE — Letter (Signed)
Summary: Anticoagulation Modification  / [facility]  Anticoagulation Modification  / [facility]   Imported By: Lennie Odor 06/25/2010 15:30:56  _____________________________________________________________________  External Attachment:    Type:   Image     Comment:   External Document

## 2011-01-09 NOTE — Letter (Signed)
Summary: EGD Instructions  [facility]  [address] [place], [place] [address]   Phone: [phone]  Fax: [phone]       Theresa [name]    1946-06-24    MRN: [id number]       Procedure Day /Date:MONDAY 10/07/2010     Arrival Time:9AM     Procedure Time:10AM    Location of Procedure:                    X  [facility] ( Outpatient Registration)    PREPARATION FOR ENDOSCOPY/BALLOON   On10/31/2011  THE DAY OF THE PROCEDURE:  1.   No solid foods, milk or milk products are allowed after midnight the night before your procedure.  2.   Do not drink anything colored red or purple.  Avoid juices with pulp.  No orange juice.  3.  You may drink clear liquids until6AM , which is 4 hours before your procedure.                                                                                                CLEAR LIQUIDS INCLUDE: Water Jello Ice Popsicles Tea (sugar ok, no milk/cream) Powdered fruit flavored drinks Coffee (sugar ok, no milk/cream) Gatorade Juice: apple, white grape, white cranberry  Lemonade Clear bullion, consomm, broth Carbonated beverages (any kind) Strained chicken noodle soup Hard Candy   MEDICATION INSTRUCTIONS  Unless otherwise instructed, you should take regular prescription medications with a small sip of water as early as possible the morning of your procedure.    Stop taking Plavix   (7 days before procedure).   09/30/2010 HAS ALREADY BEEN OK'D              OTHER INSTRUCTIONS  You will need a responsible adult at least 66 years of age to accompany you and drive you home.   This person must remain in the waiting room during your procedure.  Wear loose fitting clothing that is easily removed.  Leave jewelry and other valuables at home.  However, you may wish to bring a book to read or an iPod/MP3 player to listen to music as you wait for your procedure to start.  Remove all body piercing jewelry and leave at  home.  Total time from sign-in until discharge is approximately 2-3 hours.  You should go home directly after your procedure and rest.  You can resume normal activities the day after your procedure.  The day of your procedure you should not:   Drive   Make legal decisions   Operate machinery   Drink alcohol   Return to work  You will receive specific instructions about eating, activities and medications before you leave.    The above instructions have been reviewed and explained to me by   _______________________    I fully understand and can verbalize these instructions _____________________________ Date _________

## 2011-01-09 NOTE — Progress Notes (Signed)
Summary: resch  Phone Note Call from Patient Call back at Home Phone [phone]   Caller: Patient Call For: Dr. Arlyce Dice Reason for Call: Talk to Nurse Summary of Call: would like to resch hospital procedure Initial call taken by: Vallarie Mare,  November 04, 2010 11:16 AM  Follow-up for Phone Call        Patient wants to cancel EGD with bal dil and reschedule to 12/18/09  12:30.  She has been rescheduled Follow-up by: Darcey Nora RN, CGRN,  November 04, 2010 3:41 PM

## 2011-01-09 NOTE — Letter (Signed)
[facility]   Imported By: Sherian Rein 03/01/2010 08:24:53  _____________________________________________________________________  External Attachment:    Type:   Image     Comment:   External Document

## 2011-01-09 NOTE — Procedures (Signed)
Summary: Upper Endoscopy  Patient: Theresa [name] Note: All result statuses are Final unless otherwise noted.  Tests: (1) Upper Endoscopy (EGD)   EGD Upper Endoscopy       DONE (C)     [facility]     [address] [place], [place]  [address]           ENDOSCOPY PROCEDURE REPORT           PATIENT:  Theresa, [name]  MR#:  [id number]     BIRTHDATE:  1946-04-11, 64 yrs. old  GENDER:  female           ENDOSCOPIST:  Barbette Hair. Arlyce Dice, MD     Referred by:           PROCEDURE DATE:  10/07/2010     PROCEDURE:  EGD with biopsy, [id number], EGD with balloon dilatation     ASA CLASS:  Class II     INDICATIONS:  dysphagia, nausea           MEDICATIONS:   Fentanyl 75 mcg, Versed 6 mg, glycopyrrolate     (Robinal) 0.2 mg IV     TOPICAL ANESTHETIC:  Cetacaine Spray           DESCRIPTION OF PROCEDURE:   After the risks benefits and     alternatives of the procedure were thoroughly explained, informed     consent was obtained.  The Pentax Gastroscope [id number] endoscope     was introduced through the mouth and advanced to the third portion     of the duodenum, without limitations.  The instrument was slowly     withdrawn as the mucosa was fully examined.     <<PROCEDUREIMAGES>>           Multiple erosions were found in the antrum. Multiple linear     erosions. Bxs taken (see image1, image2, and image3).  A stricture     was found at the gastroesophageal junction. Moderate stricture.     10mm scope passed with resistance (see image1 and image4). balloon     dilation 11-12mm balloons; moderate resistance; mild heme     Otherwise the examination was normal.    Retroflexed views revealed     no abnormalities.    The scope was then withdrawn from the patient     and the procedure completed.           COMPLICATIONS:  None           ENDOSCOPIC IMPRESSION:     1) Erosions, multiple in the antrum     2) Stricture at the gastroesophageal junction     3) Otherwise normal  examination     RECOMMENDATIONS:     1) continue PPI     2) endoscopy with dilitation 3-4 weeks; hold plavix for 7 days     prior to procedure     3) resume plavix in 7 days           REPEAT EXAM:  In 3-4 week(s) for Dilatation.           ______________________________     Barbette Hair Arlyce Dice, MD           CC:  Duane Lope, MD           n.     REVISED:  10/07/2010 10:51 AM     eSIGNED:   Barbette Hair. Kaplan at 10/07/2010 10:51 AM  Kalynn, Declercq, [id number]  Note: An exclamation mark (!) indicates a result that was not dispersed into the flowsheet. Document Creation Date: 10/07/2010 10:52 AM _______________________________________________________________________  (1) Order result status: Final Collection or observation date-time: 10/07/2010 10:23 Requested date-time:  Receipt date-time:  Reported date-time:  Referring Physician:   Ordering Physician: Melvia Heaps [phone]) Specimen Source:  Source: Launa Grill Order Number: [id number] Lab site:   Appended Document: EGD with balloon dilatation Patient scheduled for EGD with balloon dilatation at [facility] Endo on 11/06/10 @ 12:30 PM. Scheduled with Noreene Larsson case # [id number].    Appended Document: Upper Endoscopy Patient notified of appointment on 11/30/11at [facility] endo. Arrival time 10:30 with procedure at 12:30. Mailed instructions to patients home.

## 2011-01-09 NOTE — Assessment & Plan Note (Signed)
Summary: 3 WEEKS OF NAUSEA AND 6 LB. WEIGHT LOSS  (DR.KAPLAN PT.)  [name]...   History of Present Illness Visit Type: Follow-up Visit Primary GI MD: Melvia Heaps MD [facility] Primary Provider: Duane Lope, MD Chief Complaint: Nausea & weight loss x 3weeks History of Present Illness:   Patient known to Dr. Arlyce Dice for history of esophageal strictures and colitis (? ulcerative colitis). She is here for a three week history of non-radiating epigastric pain, worse with meals and associated with nausea. and occasional vomiting. On Prilosec once daiily for a year but still having some hiccups and reflux. No change in bowel habits, stools chronically loose, approximately 8 / day, non-bloody.     GI Review of Systems    Reports abdominal pain, bloating, loss of appetite, and  nausea.     Location of  Abdominal pain: epigastric area.    Denies acid reflux, belching, chest pain, dysphagia with liquids, dysphagia with solids, heartburn, vomiting, vomiting blood, weight loss, and  weight gain.      Reports diarrhea.     Denies anal fissure, black tarry stools, change in bowel habit, constipation, diverticulosis, fecal incontinence, heme positive stool, hemorrhoids, irritable bowel syndrome, jaundice, light color stool, liver problems, rectal bleeding, and  rectal pain.    Current Medications (verified): 1)  Aspirin 81 Mg Tbec (Aspirin) .[name].. 1 Tablet By Mouth Once Daily 2)  Prilosec Otc 20 Mg Tbec (Omeprazole Magnesium) .[name].. 1 Tablet By Mouth Once Daily 3)  Sertraline Hcl 100 Mg Tabs (Sertraline Hcl) .... 1/2 Tablet By Mouth Once Daily 4)  Arimidex 1 Mg Tabs (Anastrozole) .[name].. 1 Tablet By Mouth Once Daily 5)  Plavix 75 Mg Tabs (Clopidogrel Bisulfate) .... Once Daily 6)  Crestor 10 Mg Tabs (Rosuvastatin Calcium) .... Once Daily  Allergies (verified): 1)  ! 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 Cholecystectomy Tubal ligation Appendectomy Lumpectomy rt. breast  Family History: Reviewed history from 02/05/2009 and no changes required. No FH of Colon Cancer: Family History of Heart Disease: father  Social History: Reviewed history from 05/03/2009 and no changes required. Patient is a former smoker. Quit 8 years ago Alcohol Use - yes 2 drinks per night Daily Caffeine Use 6 per day Occupation: not employed Illicit Drug Use - no Married  Review of Systems       The patient complains of back pain, fatigue, and swelling of feet/legs.  The patient denies allergy/sinus, anemia, anxiety-new, arthritis/joint pain, blood in urine, breast changes/lumps, change in vision, confusion, cough, coughing up blood, depression-new, fainting, fever, headaches-new, hearing problems, heart murmur, heart rhythm changes, itching, menstrual pain, muscle pains/cramps, night sweats, nosebleeds, pregnancy symptoms, shortness of breath, skin rash, sleeping problems, sore throat, swollen lymph glands, thirst - excessive , urination - excessive , urination changes/pain, urine leakage, vision changes, and voice change.    Vital Signs:  Patient profile:   65 year old female Height:      63 inches Weight:      153.25 pounds BMI:     27.25 Pulse rate:   72 / minute Pulse rhythm:   regular BP sitting:   116 / 80  (left arm) Cuff size:   regular  Vitals Entered By: June McMurray CMA Duncan Dull) (June 20, 2010 1:26 PM)  Physical Exam  General:  Well developed, well nourished, no acute distress. Head:  Normocephalic and atraumatic. Eyes:  Conjunctiva pink, no icterus.  Neck:  no obvious masses  Lungs:  Clear throughout to auscultation. Heart:  Regular rate and rhythm;  Abdomen:   Abdomen soft, nontender, nondistended. No obvious masses or hepatomegaly.Normal bowel sounds.  Msk:  Symmetrical with no gross deformities. Normal posture. Extremities:  No palmar erythema, no edema.  Neurologic:  Alert and  oriented x4;  grossly normal neurologically. Skin:  Intact without significant lesions or rashes. Cervical Nodes:  No significant cervical adenopathy. Psych:  Alert and cooperative. Normal mood and affect.   Impression & Recommendations:  Problem # 1:  ABDOMINAL PAIN-EPIGASTRIC (ICD-789.06) Assessment New Three week history of non-radiating epigastric pain, worse with meals and associated with nausea and occasional vomiting. Associated weight loss of six pounds (patient's scale). Differentials include but not limited to dyspepsia, PUD, mesenteric ischemia, pancreatitis (less likely), biliary disease (less likely). Will obtain labs. The patient will be scheduled for an EGD with biopsies/ esophageal dilation ( if indicated).  The risks and benefits of the procedure, as well as alternatives were discussed with the patient and she agrees to proceed. We will contact Dr. Allyson Sabal about holding Plavix for the procedure. Continue the Zofran as needed. If EGD is negative, consider workup for mesenteric ischemic given history of PVD.    EGD (EGD)  Problem # 2:  Hx of ADENOCARCINOMA, BREAST (ICD-174.9) Assessment: Comment Only  Problem # 3:  COLITIS (ICD-558.9) Left-sided Assessment: Unchanged Flexible sigmoidoscopy 4/10 revealed left sided colitis with diffuse moderate erythema. Post-procedure started on Lialda but patient couldn't swallow the pills. Left colon biopsies were not conclusive. Her chronic diarrhea is unchanged. Future management per Dr. Arlyce Dice  Problem # 4:  PVD (ICD-443.9) Assessment: Comment Only On Plavix  Other Orders: TLB-CBC Platelet - w/Differential (85025-CBCD) TLB-CMP (Comprehensive Metabolic Pnl) (80053-COMP) TLB-Lipase (83690-LIPASE)  Patient  Instructions: 1)  Please go to lab, basement level. 2)  We have scheduled the Endoscopy with Dr. Arlyce Dice for 07-04-10.Directions and brochure provided. 3)  Directions and brochure provided. 4)  [facility] Patient Information Guide given to patient. 5)  We will call you when we hear from Dr. Nanetta Batty regarding the Plavix medication. He may want you to hold it for 5-7 days. 6)  Copy sent to : Duane Lope, MD 7)  The medication list was reviewed and reconciled.  All changed / newly prescribed medications were explained.  A complete medication list was provided to the patient / caregiver. -

## 2011-01-09 NOTE — Letter (Signed)
Summary: [facility]  [facility]   Imported By: Lennie Odor 08/23/2010 15:55:13  _____________________________________________________________________  External Attachment:    Type:   Image     Comment:   External Document

## 2011-01-09 NOTE — Progress Notes (Signed)
Summary: Hospital Procedure  Phone Note Call from Patient Call back at Home Phone [phone]   Caller: Patient Call For: Dr. Arlyce Dice Reason for Call: Talk to Nurse Summary of Call: Pt needs to rescheduled procedure at hospital, husband suprised her with a trip Initial call taken by: [place] Johnson,  December 10, 2010 3:44 PM  Follow-up for Phone Call        Patient called and needs to reschedule her procedure due to planned trip. Procedure rescheduled to 12/30/10 @12 :30pm at [facility]. Patient aware of appointment date and time. Follow-up by: Selinda Michaels RN,  December 10, 2010 4:45 PM

## 2011-01-09 NOTE — Letter (Signed)
Summary: EGD Instructions  [facility]  [address] [place], [place] [address]   Phone: [phone]  Fax: [phone]       Theresa [name]    11-08-1946    MRN: [id number]       Procedure Day /Date:07-04-10     Arrival Time: 8:00 Am      Procedure Time:9:00 Am     Location of Procedure:                    X     [facility] (4th Floor) PREPARATION FOR ENDOSCOPY   On 07-04-10 THE DAY OF THE PROCEDURE:  1.   No solid foods, milk or milk products are allowed after midnight the night before your procedure.  2.   Do not drink anything colored red or purple.  Avoid juices with pulp.  No orange juice.  3.  You may drink clear liquids until 7:00 Am , which is 2 hours before your procedure.                                                                                                CLEAR LIQUIDS INCLUDE: Water Jello Ice Popsicles Tea (sugar ok, no milk/cream) Powdered fruit flavored drinks Coffee (sugar ok, no milk/cream) Gatorade Juice: apple, white grape, white cranberry  Lemonade Clear bullion, consomm, broth Carbonated beverages (any kind) Strained chicken noodle soup Hard Candy   MEDICATION INSTRUCTIONS  Unless otherwise instructed, you should take regular prescription medications with a small sip of water as early as possible the morning of your procedure.   We will contact you once we hear from Dr. Nanetta Batty regarding the Plavix.          OTHER INSTRUCTIONS  You will need a responsible adult at least 66 years of age to accompany you and drive you home.   This person must remain in the waiting room during your procedure.  Wear loose fitting clothing that is easily removed.  Leave jewelry and other valuables at home.  However, you may wish to bring a book to read or an iPod/MP3 player to listen to music as you wait for your procedure to start.  Remove all body piercing jewelry and leave at home.  Total time from sign-in  until discharge is approximately 2-3 hours.  You should go home directly after your procedure and rest.  You can resume normal activities the day after your procedure.  The day of your procedure you should not:   Drive   Make legal decisions   Operate machinery   Drink alcohol   Return to work  You will receive specific instructions about eating, activities and medications before you leave.    The above instructions have been reviewed and explained to me by   _______________________    I fully understand and can verbalize these instructions _____________________________ Date _________

## 2011-01-14 ENCOUNTER — Encounter: Payer: Self-pay | Admitting: Gastroenterology

## 2011-01-15 NOTE — Progress Notes (Signed)
Summary: Res Endo 2-7 @ [facility]  Phone Note Call from Patient Call back at [facility] Phone [phone]   Call For: Dr Arlyce Dice Summary of Call: 01-14-11 Endo Procedure @ [facility]. Needs to rescheule. Daughter is not due but could be having baby any day and she needs to take care of the children. Initial call taken by: Leanor Kail [facility],  January 06, 2011 10:13 AM  Follow-up for Phone Call        Patient states that he daughter-in-law is in the hospital on bedrest due to complications with pregnancy. She cannot come for her egd with dil because she is needed to help care for daughter-in-law and her other grandchildren. Patient states she will call back to schedule when she can. Follow-up by: Selinda Michaels RN,  January 06, 2011 2:13 PM  Additional Follow-up for Phone Call Additional follow up Details #1::        ok Additional Follow-up by: Louis Meckel MD,  January 07, 2011 9:33 AM

## 2011-02-03 ENCOUNTER — Encounter (HOSPITAL_BASED_OUTPATIENT_CLINIC_OR_DEPARTMENT_OTHER): Payer: Medicare Other | Admitting: Internal Medicine

## 2011-02-03 ENCOUNTER — Other Ambulatory Visit: Payer: Self-pay | Admitting: Internal Medicine

## 2011-02-03 DIAGNOSIS — C50919 Malignant neoplasm of unspecified site of unspecified female breast: Secondary | ICD-10-CM

## 2011-02-03 LAB — COMPREHENSIVE METABOLIC PANEL
ALT: 23 U/L (ref 0–35)
AST: 25 U/L (ref 0–37)
Alkaline Phosphatase: 75 U/L (ref 39–117)
BUN: 6 mg/dL (ref 6–23)
Chloride: 102 mEq/L (ref 96–112)
Creatinine, Ser: 0.77 mg/dL (ref 0.40–1.20)
Total Bilirubin: 0.5 mg/dL (ref 0.3–1.2)

## 2011-02-03 LAB — CBC WITH DIFFERENTIAL/PLATELET
BASO%: 0.2 % (ref 0.0–2.0)
EOS%: 1.6 % (ref 0.0–7.0)
HCT: 44.2 % (ref 34.8–46.6)
LYMPH%: 24.6 % (ref 14.0–49.7)
MCH: 30.6 pg (ref 25.1–34.0)
MCHC: 33.4 g/dL (ref 31.5–36.0)
MCV: 91.5 fL (ref 79.5–101.0)
MONO%: 7.2 % (ref 0.0–14.0)
NEUT%: 66.4 % (ref 38.4–76.8)
lymph#: 2.6 10*3/uL (ref 0.9–3.3)

## 2011-02-26 ENCOUNTER — Ambulatory Visit: Payer: Self-pay | Admitting: Vascular Surgery

## 2011-03-02 LAB — URINE CULTURE: Colony Count: >=100000

## 2011-03-02 LAB — COMPREHENSIVE METABOLIC PANEL
AST: 37 U/L (ref 0–37)
BUN: 9 mg/dL (ref 6–23)
CO2: 23 mEq/L (ref 19–32)
Calcium: 8.5 mg/dL (ref 8.4–10.5)
Chloride: 110 mEq/L (ref 96–112)
Creatinine, Ser: 0.61 mg/dL (ref 0.4–1.2)
GFR calc Af Amer: >60 mL/min (ref >60)
GFR calc non Af Amer: >60 mL/min (ref >60)
Total Bilirubin: 0.6 mg/dL (ref 0.3–1.2)

## 2011-03-02 LAB — URINALYSIS, ROUTINE W REFLEX MICROSCOPIC
Ketones, ur: NEGATIVE mg/dL
Nitrite: NEGATIVE
Protein, ur: NEGATIVE mg/dL
Urobilinogen, UA: 0.2 mg/dL (ref 0.0–1.0)

## 2011-03-02 LAB — DIFFERENTIAL
Basophils Absolute: 0 10*3/uL (ref 0.0–0.1)
Eosinophils Relative: 3 % (ref 0–5)
Lymphocytes Relative: 23 % (ref 12–46)
Lymphs Abs: 1.8 10*3/uL (ref 0.7–4.0)
Neutro Abs: 5.1 10*3/uL (ref 1.7–7.7)

## 2011-03-02 LAB — PROTIME-INR
INR: 1.11 (ref 0.00–1.49)
Prothrombin Time: 14.2 seconds (ref 11.6–15.2)

## 2011-03-02 LAB — POCT I-STAT, CHEM 8
BUN: 8 mg/dL (ref 6–23)
Creatinine, Ser: 0.4 mg/dL (ref 0.4–1.2)
Glucose, Bld: 117 mg/dL — ABNORMAL HIGH (ref 70–99)
Sodium: 142 mEq/L (ref 135–145)
TCO2: 23 mmol/L (ref 0–100)

## 2011-03-02 LAB — CBC
HCT: 42.1 % (ref 36.0–46.0)
MCHC: 33.2 g/dL (ref 30.0–36.0)
MCV: 94.4 fL (ref 78.0–100.0)
Platelets: 264 10*3/uL (ref 150–400)
RBC: 4.46 MIL/uL (ref 3.87–5.11)
WBC: 7.6 10*3/uL (ref 4.0–10.5)

## 2011-03-02 LAB — POCT CARDIAC MARKERS
CKMB, poc: 2.6 ng/mL (ref 1.0–8.0)
Myoglobin, poc: 100 ng/mL (ref 12–200)
Myoglobin, poc: 103 ng/mL (ref 12–200)

## 2011-03-02 LAB — URINE MICROSCOPIC-ADD ON

## 2011-03-02 LAB — APTT: aPTT: 27 seconds (ref 24–37)

## 2011-03-07 ENCOUNTER — Encounter (INDEPENDENT_AMBULATORY_CARE_PROVIDER_SITE_OTHER): Payer: Medicare Other

## 2011-03-07 DIAGNOSIS — M7989 Other specified soft tissue disorders: Secondary | ICD-10-CM

## 2011-03-12 ENCOUNTER — Encounter (INDEPENDENT_AMBULATORY_CARE_PROVIDER_SITE_OTHER): Payer: Medicare Other

## 2011-03-12 ENCOUNTER — Ambulatory Visit (INDEPENDENT_AMBULATORY_CARE_PROVIDER_SITE_OTHER): Payer: Medicare Other | Admitting: Vascular Surgery

## 2011-03-12 DIAGNOSIS — M79609 Pain in unspecified limb: Secondary | ICD-10-CM

## 2011-03-12 DIAGNOSIS — I70219 Atherosclerosis of native arteries of extremities with intermittent claudication, unspecified extremity: Secondary | ICD-10-CM

## 2011-03-13 NOTE — Assessment & Plan Note (Signed)
OFFICE VISIT  Theresa [name], Theresa [name] DOB:  1946/03/20                                       03/12/2011 ZOXWR#:[id number]  I saw the patient in the office today for continued follow-up of her peripheral vascular disease.  I had last seen in this in December 2011. 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  Since I saw her last, she had developed some pain in her left medial calf and had a venous duplex scan in our office on March 07, 2011, which showed no evidence of DVT.  I suspect she has some phlebitis.  She states that the pain in her left calf has improved.  She continues to have stable calf claudication bilaterally.  Her symptoms are equal on both legs and are brought on by ambulation and relieved with rest.  Her symptoms have been stable over the last year.  She denies any history of rest pain and she has had no history of nonhealing wounds.  SOCIAL HISTORY:  She is married.  She quit tobacco approximately 10 years ago.  REVIEW OF SYSTEMS:  CARDIOVASCULAR:  She has had no chest pain, chest pressure, palpitations or arrhythmias. PULMONARY:  She has had no productive cough, bronchitis, asthma or wheezing.  PHYSICAL EXAMINATION:  This is a pleasant 65 year old woman who appears her stated age.  Blood pressure is 145/81, heart rate is 90, respiratory rate is 16.  Lungs are clear bilaterally to auscultation without rales, rhonchi or wheezing.  On cardiovascular exam I do not detect any carotid bruits.  She has a regular rate and rhythm.  She has normal femoral pulses.  She has a diminished but palpable left popliteal pulse.  I cannot palpate a right popliteal pulse.  I cannot palpate pedal pulses on either side.  She has no significant lower extremity swelling.  She has some small telangiectatic veins bilaterally.  Abdomen is soft and nontender with normal-pitched bowel sounds.   Neurologic:  She has no focal weakness or paresthesias.  Extremity exam:  Currently she has no evidence of active phlebitis in the left thigh.  There is no induration or erythema.  I have independently interpreted her duplex scan from March 07, 2011, which shows no evidence of DVT in the left lower extremity.  There is no evidence of significant reflux, either.  I have also independently interpreted her arterial Doppler study done today, which shows an ABI of 83% on the right and 86% on the left. There is a biphasic posterior tibial signal on the right and a triphasic dorsalis pedis signal.  She has a triphasic posterior tibial and dorsalis pedis signal on the left.  Based on her exam and Doppler study, she has evidence of mild infrainguinal arterial occlusive disease bilaterally.  I have encouraged her to stay as active as possible.  I will plan on seeing her back in 1 year and I have ordered follow-up ABIs done at that time.    Di Kindle. Edilia Bo, M.D. Electronically Signed  CSD/MEDQ  D:  03/12/2011  T:  03/13/2011  Job:  [id number]  cc:   Dr. Duane Lope

## 2011-03-13 NOTE — Procedures (Unsigned)
DUPLEX DEEP VENOUS EXAM - LOWER EXTREMITY  INDICATION:  Left lower extremity swelling.  HISTORY:  Edema:  yes Trauma/Surgery:  no Pain:  no PE:  no Previous DVT:  no Anticoagulants:  no Other:  DUPLEX EXAM:               CFV   SFV   PopV  PTV    GSV               R  L  R  L  R  L  R   L  R  L Thrombosis    o  o     o     o      o     o Spontaneous   +  +     +     +      +     + Phasic        +  +     +     +      +     + Augmentation  +  +     +     +      +     + Compressible  +  +     +     +      +     + Competent     +  +     +     +      +     +  Legend:  + - yes  o - no  p - partial  D - decreased  IMPRESSION:  No evidence of left lower extremity deep venous thrombosis.  Preliminary results called to Dr. Tenny Craw' office.   _____________________________ Di Kindle. Edilia Bo, M.D.  EM/MEDQ  D:  03/10/2011  T:  03/10/2011  Job:  [id number]

## 2011-04-22 NOTE — Assessment & Plan Note (Signed)
OFFICE VISIT   Theresa [name], Theresa [name]  DOB:  Mar 04, 1946                                       08/08/2008  XBJYN#:[id number]   I saw the patient in the office today for continued followup of her  peripheral vascular disease.  I had originally seen her in consultation  in February of this year.  At that time I felt she had bilateral  superficial femoral artery occlusive disease.  We discussed the  importance of a structured walking program.  I had also written her a  prescription for cilostazol.  Since I saw her last she states she has  not been walking as much as she would like because she has been fairly  busy.  Her symptoms have remained essentially the same over the last 6  months.  She gets claudication in both lower extremities that involves  her thighs and calves.  This occurs at approximately half a block.  She  has had no rest pain and no history of nonhealing ulcers.  She does not  think that she ever filled her prescription for cilostazol.  Fortunately  she quit smoking approximately 7 years ago.   REVIEW OF SYSTEMS:  On review of systems she has had no recent chest  pain, chest pressure, palpitations or arrhythmias.  She has had no  bronchitis, asthma or wheezing.   PHYSICAL EXAMINATION:  Vital signs:  On physical examination blood  pressure is 140/89, heart rate is 75.  Neck:  I do not detect any  carotid bruits.  Lungs:  Are clear bilaterally to auscultation.  Cardiac:  She has a regular rate and rhythm.  She has normal femoral  pulses.  I cannot palpate popliteal or pedal pulses on either side.  She  has a biphasic Doppler signals in both feet.  She has no ischemic ulcers  on her feet.   Doppler study in our office today shows an ankle brachial index of 73%  on the right and 76% on the left.  These are stable compared to  February.  I again encouraged her to do as much walking as possible.  I  have also given her another prescription for  cilostazol.  I plan on  seeing her back in 1 year.  She knows to call sooner if she has  problems.  She understands we generally would not consider arteriography  and revascularization unless she develops disabling claudication, rest  pain or a nonhealing ulcer.   Di Kindle. Edilia Bo, M.D.  Electronically Signed   CSD/MEDQ  D:  08/08/2008  T:  08/09/2008  Job:  [id number]   cc:   C. Duane Lope, M.D.

## 2011-04-22 NOTE — Consult Note (Signed)
VASCULAR SURGERY CONSULTATION   Theresa, CORREIRA [name]  DOB:  Jan 04, 1946                                       01/25/2008  BJYNW#:[id number]   I saw the patient in the office today in consultation concerning her  bilateral lower extremity claudication.  She was referred by Dr.  Jenne Campus.  This is a pleasant 65 year old woman who gives a long history  of stable claudication of both lower extremities that involves both her  thighs and calves.  This occurs at a fairly short distance, at  approximately half a block.  Her symptoms are relieved with rest.  She  states that her symptoms have actually been fairly stable over the last  several years.  She denies any history of rest pain and has had no  history of nonhealing wounds.  Of note, she underwent the diagnostic  arteriography approximately seven years ago by Dr. Allyson Sabal, and she was  told the disease was too extensive for an endovascular approach to her  SFA occlusions.   Her past medical history is significant for hypercholesterolemia,  gastroesophageal reflux disease, and a history of breast cancer.  She  had a lumpectomy and postoperative radiation therapy for her breast  cancer, which has apparently been cured.  She denies any history of  diabetes, hypertension, previous myocardial infarction, history of  congestive heart failure, or history of COPD.   FAMILY HISTORY:  Her father died from a heart attack at age 70.  She is  unaware of any other history of premature cardiovascular disease.   SOCIAL HISTORY:  She is married.  She has two children.  She is retired.  She quit tobacco approximately seven years ago.  She had smoked two  packs per day since she was 65 years old.   Her review of systems are documented on the medical history form in the  chart.   Medications currently include Advil, multivitamin, 81 mg of aspirin a  day, Arimidex, sertraline, and Prilosec.  She had been on Vytorin, which  she stopped  because she felt that she was having an allergic reaction to  this.   ALLERGIES:  Sulfa drugs.   PHYSICAL EXAMINATION:  This is a pleasant 64 year old woman who appears  her stated age.  Blood pressure is 142/88.  Heart rate is 100.  HEENT:  Her neck is supple.  There is no cervical lymphadenopathy.  I do not  detect any carotid bruits.  Lungs are clear bilaterally to auscultation.  On cardiac exam, she has a regular rate and rhythm.  Abdomen is soft and  nontender.  I cannot palpate an aneurysm.  She has normal femoral  pulses.  I cannot palpate popliteal or pedal pulses on either side.  She  does have a monophasic dorsalis pedis, posterior tibial, and peroneal  signal with the Doppler bilaterally.  Both feet appear adequately  perfused without ischemic ulcers.  There is no significant lower  extremity swelling.  Neurologic exam is nonfocal.   She did have a Doppler study in November which showed an ABI at 59% on  the right and 66% on the left.   This patient has evidence of bilateral superficial femoral artery  occlusive disease and perhaps some tibial occlusive disease.  Currently,  however, her symptoms are quite stable.  She does not have limb-  threatening ischemia with no  significant rest pain and no history of  nonhealing ulcers.  We have discussed conservative treatment with a  structured walking program and the use of cilostazol.  We also discussed  the option of a bypass procedure, as she did not appear to be a  candidate for an endovascular approach, based on her study of  approximately seven years ago.  She is comfortable with attempting a  structured walking program and cilostazol.  She understands if her  symptoms progress significantly, we certainly have the option of  repeating her arteriogram and evaluating for infrarenal bypass.  Fortunately, she is not a smoker.  I have written her a prescription for  cilostazol.  I plan on seeing her back in six months.  She  knows to call  sooner if she has problems.   Di Kindle. Edilia Bo, M.D.  Electronically Signed  CSD/MEDQ  D:  01/25/2008  T:  01/27/2008  Job:  [id number]   cc:   Darlin Priestly, MD  C. Duane Lope, M.D.

## 2011-04-22 NOTE — Assessment & Plan Note (Signed)
OFFICE VISIT   Theresa [name], Theresa [name]  DOB:  [birth year]                                       09/06/2009  ZOXWR#:[id number]   I saw the patient in the office today for continued follow-up of her  peripheral vascular disease.  She has known bilateral superficial  femoral artery occlusive disease, and I have been seeing her on a yearly  basis.  Since I saw her last, she has stable claudication that involves  her calves and thighs.  It is brought on by ambulation and relieved with  rest.  Her symptoms are equal in both legs.  She can walk about half a  block before experiencing symptoms.  Her symptoms have not changed  significantly over the last year.  Fortunately, she currently does not  smoke.  She has had no rest pain and no history of nonhealing ulcers.   Her past medical history is not changed except that she has been having  some low back problems recently.   REVIEW OF SYSTEMS:  She has a history of reflux and hiatal hernia.  She  has had no chest pain, chest pressure, palpitations or arrhythmias.  She  has had no productive cough, bronchitis, asthma or wheezing.   PHYSICAL EXAMINATION:  This is a pleasant 65 year old woman who appears  her stated age.  Her blood pressure is 117/75, heart rate is 80.  I do  not detect any carotid bruits.  The lungs are clear bilaterally to  auscultation.  On cardiac exam, she has a regular rate and rhythm.  Abdomen is soft and nontender.  She has palpable femoral pulses.  I  cannot palpate popliteal or pedal pulses; however, both feet are warm  and well-perfused without ischemic ulcers.   She did have a Doppler study in our office today which shows a fairly  brisk Doppler signal in both feet with an ABI of 71% on the right and  79% on the left.  These ABIs are stable compared to the study a year  ago.   Her vascular disease remains stable and her symptoms are tolerable.  I  have simply encouraged her to do as much  walking as possible.  We will  stretch her follow-up out to a year and a half and I will see her back  at that time.  She knows to call sooner if she has problems.  She is  also concerned about her cholesterol, and she is going to follow up with  Dr. Tenny Craw on this.   Di Kindle. Edilia Bo, M.D.  Electronically Signed   CSD/MEDQ  D:  09/06/2009  T:  09/07/2009  Job:  [id number]   cc:   Dr. Miguel Aschoff

## 2011-04-22 NOTE — Assessment & Plan Note (Signed)
OFFICE VISIT   Theresa [name], Theresa [name]  DOB:  07/06/1946                                       11/20/2010  QIONG#:[id number]   I saw the patient in the office today for continued followup of her  peripheral vascular disease.  I had last seen her in September 2010.  She has known bilateral superficial femoral artery occlusive disease by  exam.  Since I saw her last, she continues to have stable claudication  of both thighs and calves.  Her symptoms are equal on both sides.  They  occur at approximately half a block.  Her pain is brought on by  ambulation and relieved with rest.  There are no other aggravating or  alleviating factors.   The only change in her medical history is that she has been diagnosed  with ulcerative colitis.  For this reason she was concerned about taking  Plavix and after discussion with Dr. Allyson Sabal elected to stop her Plavix.  She is on aspirin 81 mg a day.   PAST MEDICAL HISTORY:  Otherwise significant for hypercholesterolemia.   SOCIAL HISTORY:  She is married.  She does not smoke tobacco.   REVIEW OF SYSTEMS:  CARDIOVASCULAR:  She had no chest pain, chest  pressure, palpitations or arrhythmias.  She has had no dyspnea on  exertion or significant orthopnea.  She has had phlebitis in the past.  GI:  She does have a history of peptic ulcer disease, hiatal hernia,  ulcerative colitis and esophageal stricture.   PHYSICAL EXAMINATION:  This is a pleasant 65 year old woman who appears  her stated age.  Her blood pressure is 118/73, heart rate is 99,  saturation 98%.  Lungs:  Clear bilaterally to auscultation without  rales, rhonchi or wheezing.  Cardiovascular:  I do not detect any  carotid bruits.  She has a regular rate and rhythm.  She has palpable  femoral pulses.  I cannot palpate popliteal or pedal pulses on either  side.  Both feet are warm and well-perfused.  She has no significant  lower extremity swelling.  Abdomen:  Soft and  nontender with normal  pitched bowel sounds.  Neurologic:  No focal weakness or paresthesias.   I reviewed the previous Doppler study which shows monophasic Doppler  signals in the posterior tibial and dorsalis pedis position on the right  with monophasic signals also on the left.  ABI on the right is 71% and  on the left 79%.   Overall her claudication symptoms remain stable.  I have ordered  followup ankle brachial indices in 18 months, and I will see her back at  that time.  She understands she can certainly call sooner if her  symptoms progress.  With respect to her Plavix, I do not see any  indication for my standpoint for her to be on Plavix.  She has not had  any previous stents.  She does know to remain on her aspirin.  She also  seems quite motivated to get on an exercise program.  Her husband got  her an exercise bike for Christmas.  She also plans on signing up for  water aerobics.  I have also discussed the importance of walking, as  this is the most specific activity for the muscles involved with her  claudication.     Di Kindle. Edilia Bo, M.D.  Electronically Signed   CSD/MEDQ  D:  11/20/2010  T:  11/21/2010  Job:  [id number]   cc:   Tenny Craw, M.D.

## 2011-04-25 NOTE — Cardiovascular Report (Signed)
[place]. [facility]  Patient:    Theresa [name], Theresa [name]                    MRN: [id number] Proc. Date: 09/24/00 Adm. Date:  [id number] Attending:  Lenise [name] H CC:         Theresa [name], M.D.  Runell Gess, M.D.   Cardiac Catheterization  PROCEDURES: 1. Left heart catheterization. 2. Coronary angiography. 3. Left ventriculogram. 4. Abdominal aortogram.  ATTENDING FOR CASE:  Theresa [name], M.D.  COMPLICATIONS:  None.  INDICATIONS:  Theresa [name] is a 65 year old white female patient of Dr. Lenise [name] and Dr. Miguel [name] with a history of hypercholesterolemia, tobacco abuse, intermittent lower extremity claudication with inability to walk more than one block without severe leg pain.  The patient underwent Persantine Cardiolite on September 13 which revealed anterior wall thinning but no definitive ischemia with an EF of 35%.  Subsequent 2-D echocardiogram revealed mild anterior leaflet thickening with mild mitral regurgitation and EF of 60%. Subsequent ABIs revealed ABI of 0.69 on the right and 0.84 on the left.  She is now referred for cardiac catheterization to define coronary anatomy and subsequent PV study.  DESCRIPTION OF OPERATION:  After giving informed consent, the patient was brought to the cardiac catheterization lab where her right and left groins were shaved, prepped and draped in the usual sterile fashion.  ECG monitoring was established.  Using modified Seldinger technique, a 6-French arterial sheath was inserted in the left femoral artery.  A 6-French diagnostic catheter was then used to perform diagnostic angiography.  This revealed a large left main with no significant disease.  The LAD is a large vessel which coursed at the apex and gave rise to three diagonal branches.  The LAD has no significant disease.  The first and second diagonals are small vessels with no significant disease.  The third diagonal is a medium size vessel  which bifurcates distally and has no significant disease.  The left circumflex is a large vessel which coursed in the A-V groove and gave rise to two obtuse marginal branches.  The A-V groove circumflex was noted to have a 30% proximal and 30% mid vessel stenotic lesion.  The first OM is a medium size vessel with no significant disease.  The second OM is a medium size vessel which bifurcates distally and has no significant disease.  The right coronary artery is a large vessel which is dominant and gives rise to both PDA as well as the posterolateral branch.  The RCA is noted to have a 50% proximal and 50% mid vessel stenotic lesion with coarse irregularities throughout.  PDA and posterolateral branches are large vessels with no significant disease.  Left ventriculogram reveals preserved EF calculated at 60%.  Abdominal aortogram reveals a normal distal aorta with normal right iliacs. There is a 50% left ostial internal iliac stenosis.  HEMODYNAMICS:  Systemic arterial pressure 120/68, LV systemic pressure 118/12, LVEDP of 14.  At the conclusion of the procedure, the 6-French left femoral arterial sheath was sutured in place, and 2000 units of IV heparin were given.  CONCLUSIONS: 1. No significant coronary artery disease. 2. Normal left ventricular systolic function. 3. Normal distal aorta with a 50% left ostial internal iliac stenosis. 4. The patient is scheduled for a peripheral vein study this afternoon by    Dr. Nanetta [name] to evaluate lower extremity claudication. DD:  09/24/00 TD:  09/24/00 Job: [id number]

## 2011-04-25 NOTE — Cardiovascular Report (Signed)
[place]. [facility]  Patient:    Theresa [name], Theresa [name]                    MRN: [id number] Proc. Date: 09/24/00 Adm. Date:  [id number] Attending:  Darlin Priestly CC:         6th Floor Angiographic Suite  Miguel Aschoff, M.D.  Lenise Herald, M.D.   Cardiac Catheterization  CLINICAL HISTORY:  [name] is a 65 year old female, patient of Dr. Lenise Herald.  She has history of hyperlipidemia, hypertension and a positive family history for heart disease.  She has been complaining of chest pain and underwent cardiac catheterization; revealing noncritical CAD.  She also has claudication, which is functional limiting, with ABI significant for a right ABI of 0.69 and a left ABI of 0.84.  She presents now for abdominal aortography and bifemoral runoff.  DESCRIPTION OF PROCEDURE:  The patient was brought to the sixth floor [place]. [facility] peripheral vascular suite in the postabsorptive state.  The existing left 6-French sheath was exchanged for a new sheath using double-gloved sterile technique.  A 5-French ______ catheter, IMA and in-hole catheters were used for a midstream distal abdominal aortography as well as bifemoral runoff.   Visipaque dye was used throughout the entirety of the case.  Retrograde aortic pressures were monitored during the case.  ANGIOGRAPHIC RESULTS: 1. Abdominal Aorta:  ______ enlarged ______ . 2. Infrarenal abdominal aorta:  There was no atherosclerotic or    aneurysmal changes noted. 3. Left Lower Extremity:  Total left SFA with reconstitution of the    above-knee popliteal by profunda femoris collaterals.  There was    two-vessel runoff with an occluded anterior tibialis. 4. Right Lower Extremity:  An occluded right SFA, with reconstitution of the above-knee popliteal by profunda femoris collaterals.  There was two-vessel runoff.  Occluded posterior tibialis.   IMPRESSION:  [name] has bilateral total SFAs,  with two-vessel runoff below the knee bilaterally.  She will need femoral-popliteal bypass grafting for relief of symptoms of claudication.  DISPOSITION:  The sheaths were removed and pressure held to the groin to achieve hemostasis.  The patient left the lab in stable condition.  She will be discharged home later tonight.  Will follow up with Dr. Jenne Campus in approximately two weeks.  She has been counseled about smoking cessation; a Nicoderm patch was placed today at her request.  She left the lab in stable condition.  DD:  09/24/00 TD:  09/25/00 Job: [id number]

## 2011-04-25 NOTE — Op Note (Signed)
. [facility]  Patient:    Theresa [name], Theresa [name]                    MRN: [id number] Proc. Date: 01/08/01 Adm. Date:  [id number] Disc. Date: [id number] Attending:  Andre Lefort CC:         Miguel Aschoff, M.D.  Katherine Roan, M.D.  Jeralyn Ruths, M.D.  Maryln Gottron, M.D.   Operative Report  DATE OF BIRTH:  [birth year].  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  POSTOPERATIVE DIAGNOSIS:  Invasive carcinoma of the right breast at the 11 oclock position with negative sentinel lymph node.  PROCEDURE:  Right partial mastectomy (lumpectomy) with sentinel lymph node biopsy, injection of isosulfan blue, and radiocolloid injection.  SURGEON:  Kristine Garbe. Ezzard Standing, M.D.  ANESTHESIA:  General endotracheal.  ESTIMATED BLOOD LOSS:  Minimal.  FINDINGS:  A hot blue right axillary lymph node with counts of 180, background of 10.  _____.  INDICATION FOR PROCEDURE:  Ms. Argueta is a 65 year old white female who had a biopsy-proven carcinoma of the right breast.  I think she is an excellent candidate for a lumpectomy and axillary node dissection.  She went to  Maryln Gottron, M.D., preoperatively.  She now comes for wide excision of breast carcinoma and a sentinel lymph node biopsy with possible axillary node dissection.  DESCRIPTION OF PROCEDURE:  Patient placed in the supine position with the arms arm out to her side.  In the radiology suite, she had a subareolar injection with Technetium sulfur colloid.  She had a palpable mass in the 11 oclock position.  I then injected in the dermis and around the tumor isosulfan blue using about 3 cc.  The right breast was then prepped with Betadine and sterilely draped.  In the right axilla, I made an incision directly over the hottest spot and found a hot blue lymph node with counts of 180, background of 10.  This was sent for touch  prep by Marcie Bal, M.D., which was reported as negative.  I took some additional axillary tissue which came out with this, labeled this as separate, did not do any frozen sections on this tissue.  Hemostasis was then controlled with Bovie electrocautery and clips.  The skin was then closed with 3-0 Vicryl sutures and 5-0 subcuticular Vicryl sutures. I then turned attention to the right breast, where the patient had a palpable mass.  I made an elliptical incision and tried to get a good 1-1.5 cm margin around the entire tumor, doing basically a segmental mastectomy, taking out a block of breast tissue about 9 or 10 cm x 9 or 10 cm down to the chest wall. I labeled the specimen with a long suture medially, a short suture cephalad, and sent it to Dr. Guilford Shi.  Dr. Guilford Shi thought I had at least 1-1.5 cm margins.  I then irrigated the wound, controlled hemostasis with Bovie electrocautery and 3-0 Vicryl ties.  I took clips inside the _____ at the biopsy site as a marker for radiation therapy and then closed the skin with 3-0 Vicryl suture and 5-0 subcuticular Vicryl suture.  The patient tolerated the procedure well, was transported to the recovery room in good condition.  Sponge and needle count were correct at the end of the case.  I plan to send her home today. DD:  01/08/01 TD:  01/09/01 Job: [id number]

## 2011-04-25 NOTE — Op Note (Signed)
NAME:  Theresa [name], Theresa [name]                     ACCOUNT NO.:  [id number]   MEDICAL RECORD NO.:  [id number]                   PATIENT TYPE:  AMB   LOCATION:  DSC                                  FACILITY:  [facility]   PHYSICIAN:  Kristine Garbe. Ezzard Standing, M.D.         DATE OF BIRTH:  1946-07-08   DATE OF PROCEDURE:  03/15/2004  DATE OF DISCHARGE:                                 OPERATIVE REPORT   PREOPERATIVE DIAGNOSIS:  Chronic right ear sore.   POSTOPERATIVE DIAGNOSIS:  Chronic right ear sore.   OPERATION:  Wedge excision of chronic right ear sore x 2, one 1 cm, the  other 1.5 cm.   SURGEON:  Kristine Garbe. Ezzard Standing, M.D.   ANESTHESIA:  Local 1% Xylocaine with 1:100,000 epinephrine.   COMPLICATIONS:  None.   BRIEF CLINICAL NOTE:  [name] is a 65 year old female who has had  chronic problems with infections of her right ear and pinna for several  years now.  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  This has come and gone.  She has a very thickened cartilage superiorly of  the pinna from previous infections and she has a chronic open sore of the  inferior pinna just above the ear lobe that has been present now for several  months and causing a lot of pain.  She is taken to the operating room at  this time for excision of the chronic open sore inferiorly and wedge  excision of the more superior thickened cartilage with the small sore.   DESCRIPTION OF PROCEDURE:  Her ear was prepped with Betadine and draped out  with sterile towels.  The ear was then injected with 7 mL of Xylocaine with  epinephrine.  First, the lower ear sore was elliptically excised and sent to  [facility] pathology.  The small defect was closed with a running 5-0 nylon  suture.  Following this, the larger very thickened cartilage with a small  sore at the end of the cartilage was excised in a wedge fashion.  The  cartilage was very thickened, instead of being the normal approximately 2 mm  thickness, the cartilage was in the order of 6-8 mm of thickness.  After  wedge excising the thickened cartilage and small sore, the cartilage was  reapproximated with interrupted 4-0 Vicryl sutures.  The skin was  reapproximated with a 5-0 nylon suture with interrupted as well as running 5-  0 nylon suture.  After reapproximating the cartilage in the skin, a pressure  dressing was applied in a mastoid type dressing.  Bacitracin ointment was  applied prior to applying the mastoid dressing.  Pura tolerated this  well and is discharged home later this morning on Tylenol and Darvocet  p.r.n. pain, Keflex 500 mg b.i.d. for one week.  We will have her follow up  in my office in one week for recheck and have the sutures removed.  Kristine Garbe. Ezzard Standing, M.D.    CEN/MEDQ  D:  03/15/2004  T:  03/15/2004  Job:  [id number]

## 2011-05-07 ENCOUNTER — Other Ambulatory Visit: Payer: Self-pay | Admitting: Gastroenterology

## 2011-05-13 ENCOUNTER — Other Ambulatory Visit: Payer: Self-pay | Admitting: Gastroenterology

## 2011-05-13 MED ORDER — ONDANSETRON HCL 4 MG PO TABS: 4.0000 mg | ORAL_TABLET | Freq: Three times a day (TID) | ORAL | Status: DC | PRN

## 2011-05-13 NOTE — Telephone Encounter (Signed)
CONTACTED PT, MEDICATION SENT PT HAS AN APPOINTMENT WITH DR Annetta Maw IN [place]

## 2011-06-19 ENCOUNTER — Encounter: Payer: Self-pay | Admitting: Gastroenterology

## 2011-06-19 ENCOUNTER — Ambulatory Visit (INDEPENDENT_AMBULATORY_CARE_PROVIDER_SITE_OTHER): Payer: Medicare Other | Admitting: Gastroenterology

## 2011-06-19 DIAGNOSIS — K5289 Other specified noninfective gastroenteritis and colitis: Secondary | ICD-10-CM

## 2011-06-19 DIAGNOSIS — R112 Nausea with vomiting, unspecified: Secondary | ICD-10-CM

## 2011-06-19 DIAGNOSIS — K222 Esophageal obstruction: Secondary | ICD-10-CM

## 2011-06-19 MED ORDER — OMEPRAZOLE-SODIUM BICARBONATE 40-1100 MG PO CAPS: ORAL_CAPSULE | ORAL | Status: DC

## 2011-06-19 MED ORDER — ONDANSETRON HCL 4 MG PO TABS: 4.0000 mg | ORAL_TABLET | Freq: Three times a day (TID) | ORAL | Status: DC | PRN

## 2011-06-19 NOTE — Assessment & Plan Note (Addendum)
This is likely secondary to left-sided colitis that is not being treated.  Medications #1 Asacol  800mg  tid

## 2011-06-19 NOTE — Progress Notes (Signed)
History of Present Illness:  Theresa [name] has returned complaining of nausea and diarrhea. Nausea been a persistent problem. Theresa [name] may develop spontaneous queasiness and occasional vomiting. This tends to occur in the mornings. Theresa [name] has a known esophageal stricture but failed to followup for dilatation after an initial dilatation. Diarrhea continues. It is accompanied by urgency. Theresa [name] did not take lialda because Theresa [name] was unable to swallow the pills.    Review of Systems: Pertinent positive and negative review of systems were noted in the above HPI section. All other review of systems were otherwise negative.    Current Medications, Allergies, Past Medical History, Past Surgical History, Family History and Social History were reviewed in [employer] electronic medical record  Vital signs were reviewed in today's medical record. Physical Exam: General: Well developed , well nourished, no acute distress  Musculoskeletal: Symmetrical with no gross deformities  Pulses:  Normal pulses noted Extremities: No clubbing, cyanosis, edema or deformities noted Neurological: Alert oriented x 4, grossly nonfocal Psychological:  Alert and cooperative. Normal mood and affect

## 2011-06-19 NOTE — Patient Instructions (Signed)
Upper GI Endoscopy Upper GI endoscopy means using a flexible scope to look at the esophagus, stomach and upper small bowel. This is done to make a diagnosis in people with heartburn, abdominal pain, or abnormal bleeding. Sometimes an endoscope is needed to remove foreign bodies or food that become stuck in the esophagus; it can also be used to take biopsy samples. For the best results, do not eat or drink for 8 hours before having your upper endoscopy.  To perform the endoscopy, you will probably be sedated and your throat will be numbed with a special spray. The endoscope is then slowly passed down your throat (this will not interfere with your breathing). An endoscopy exam takes 15-30 minutes to complete and there is no real pain. Patients rarely remember much about the procedure. The results of the test may take several days if a biopsy or other test is taken.  You may have a sore throat after an endoscopy exam. Serious complications are very rare. Stick to liquids and soft foods until your pain is better. You should not drive a car or operate any dangerous equipment for at least 24 hours after being sedated. SEEK IMMEDIATE MEDICAL CARE IF:  You have severe throat pain.   You have shortness of breath.   You have bleeding problems.   You have a fever.   You have difficulty recovering from your sedation.  Document Released: 01/01/2005 Document Re-Released: 02/18/2010 [facility] Patient Information 2011 [place], [place]. Your EGD is scheduled at [facility] on 07/25/2011

## 2011-06-19 NOTE — Assessment & Plan Note (Signed)
She remains symptomatic.  Recommendations #1 followup endoscopy with balloon dilatation

## 2011-06-19 NOTE — Assessment & Plan Note (Signed)
This may in part be due to her colitis and related to GERD.  Recommendations #1 trial of Zegerid 40 mg each bedtime

## 2011-06-20 MED ORDER — MESALAMINE 400 MG PO TBEC: 800.0000 mg | DELAYED_RELEASE_TABLET | Freq: Three times a day (TID) | ORAL | Status: DC

## 2011-06-25 ENCOUNTER — Telehealth: Payer: Self-pay | Admitting: *Deleted

## 2011-06-25 NOTE — Telephone Encounter (Signed)
Called Medicare at [phone] and Zegerid is not covered under patients insurance coverage.  I called patient because Omeprazole is covered and told patient that we can leave samples up front for her to try and if it works than we can call a RX to the pharmacy.  Patient states that she has bought Prilosec OTC and will try that and if it works or not work will call us back

## 2011-06-27 ENCOUNTER — Other Ambulatory Visit: Payer: Self-pay | Admitting: Dermatology

## 2011-07-21 ENCOUNTER — Telehealth: Payer: Self-pay | Admitting: Gastroenterology

## 2011-07-21 NOTE — Telephone Encounter (Signed)
ok 

## 2011-07-21 NOTE — Telephone Encounter (Signed)
Pt is scheduled for egd with dil on Friday 07/25/11. Pt wants to let Dr. Arlyce Dice know that she has been having nausea, no appetite, and stomach discomfort for a couple of weeks. Would like to make sure Dr. Arlyce Dice looks at her stomach during procedure also. Pt reports she thinks she has lost 10 pounds. Dr. Arlyce Dice aware.

## 2011-07-25 ENCOUNTER — Encounter: Payer: Medicare Other | Admitting: Gastroenterology

## 2011-07-25 ENCOUNTER — Ambulatory Visit (HOSPITAL_COMMUNITY)
Admission: RE | Admit: 2011-07-25 | Discharge: 2011-07-25 | Disposition: A | Discharge status: Home / Self Care | Payer: Medicare Other | Source: Ambulatory Visit | Attending: Gastroenterology | Admitting: Gastroenterology

## 2011-07-25 DIAGNOSIS — K449 Diaphragmatic hernia without obstruction or gangrene: Secondary | ICD-10-CM | POA: Insufficient documentation

## 2011-07-25 DIAGNOSIS — K222 Esophageal obstruction: Secondary | ICD-10-CM | POA: Insufficient documentation

## 2011-07-28 ENCOUNTER — Telehealth: Payer: Self-pay | Admitting: Gastroenterology

## 2011-07-28 NOTE — Telephone Encounter (Signed)
1) How many Imodium/day has she tried 2) did she start Asacol?

## 2011-07-28 NOTE — Telephone Encounter (Signed)
Pt is calling c/o continued nausea and diarrhea. She had an endo with Dr. Arlyce Dice last week. She is still coughing/spitting up mucous. She states that she has taken immodium and it is not really doing anything for her, states she lives on immodium. She has a friend that takes lomotil and wondered if that might work for her. Dr. Leone Payor as doc of the day please advise.

## 2011-07-28 NOTE — Telephone Encounter (Signed)
Pt reports that she has tried taking Immodium 4 times/day. She is currently taking Asacol 2-three times a day.

## 2011-07-29 MED ORDER — DIPHENOXYLATE-ATROPINE 2.5-0.025 MG PO TABS: 1.0000 | ORAL_TABLET | Freq: Four times a day (QID) | ORAL | Status: AC | PRN

## 2011-07-29 NOTE — Telephone Encounter (Signed)
Lomotil rx written - will need to phone it in (fax) she should try not to use Imodium with this

## 2011-07-29 NOTE — Telephone Encounter (Signed)
Rx called to pharmacy. Pt aware.

## 2011-08-12 ENCOUNTER — Telehealth: Payer: Self-pay | Admitting: Gastroenterology

## 2011-08-12 NOTE — Telephone Encounter (Signed)
Left message for pt to call back.  Pt states she thinks she is having a flare of her U/C. States she has been having problems with nausea and explosive diarrhea. States she has taken some immodium but at times she cannot make it to the bathroom in time. Pt scheduled to see Willette Cluster NP 08/13/11@3 :30pm. Pt aware of appt date and time.

## 2011-08-13 ENCOUNTER — Encounter: Payer: Self-pay | Admitting: Nurse Practitioner

## 2011-08-13 ENCOUNTER — Ambulatory Visit (INDEPENDENT_AMBULATORY_CARE_PROVIDER_SITE_OTHER): Payer: Medicare Other | Admitting: Nurse Practitioner

## 2011-08-13 ENCOUNTER — Ambulatory Visit (HOSPITAL_COMMUNITY)
Admission: RE | Admit: 2011-08-13 | Discharge: 2011-08-13 | Disposition: A | Discharge status: Home / Self Care | Payer: Medicare Other | Source: Ambulatory Visit | Attending: Nurse Practitioner | Admitting: Nurse Practitioner

## 2011-08-13 VITALS — BP 104/68 | HR 104 | Ht 63.5 in | Wt 140.6 lb

## 2011-08-13 DIAGNOSIS — R059 Cough, unspecified: Secondary | ICD-10-CM

## 2011-08-13 DIAGNOSIS — Z79899 Other long term (current) drug therapy: Secondary | ICD-10-CM | POA: Insufficient documentation

## 2011-08-13 DIAGNOSIS — R05 Cough: Secondary | ICD-10-CM

## 2011-08-13 DIAGNOSIS — R197 Diarrhea, unspecified: Secondary | ICD-10-CM

## 2011-08-13 DIAGNOSIS — K5150 Left sided colitis without complications: Secondary | ICD-10-CM

## 2011-08-13 NOTE — Progress Notes (Signed)
Theresa [name] [id number] 21-Sep-1946   HISTORY OR PRESENT ILLNESS :Patient is a 65 year old female with a long-standing history of diarrhea. She has a history of esophageal strictures as well and in fact, recently underwent another EGD with dilation . She is followed by Dr. Arlyce Dice, but was formerly followed by Dr. Victorino Dike. I saw the patient in March, 2010 for lower abdominal pain, nausea, and diarrhea. Stool studies were negative with the exception of a positive lactoferrin. She was treated empirically with antibiotics, but continued to have multiple loose stools a day. She subsequently underwent a. flexible sigmoidoscopy , which revealed left-sided colitis without ulcerations. 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 Following the procedure the patient was prescribed Lialda , but had difficulty swallowing the pills. In October 2011 she was prescribed Rowasa enemas , but unfortunately, insurance would not cover them. Cortisone enemas were called to her pharmacy instead. Patient represented to the office in July of this year at which time she was complaining of nausea and persistent diarrhea.  At that time she was started on Asacol , which she has been taking for almost 2 months now without any improvement in diarrhea.  She has significant urgency and often doesn't make it to the bathroom. Lomotil four times a day doesn't help at all, Immodium works better. No recent antibiotics. No rectal bleeding. No fevers. She has a raw, achy. feeling in abdomen. No rashes, no joint pain.  Current Medications, Allergies, Past Medical History, Past Surgical History, Family History and Social History were reviewed in [employer] record.   PHYSICAL EXAMINATION : General: Well developed  female in no acute distress Head: Normocephalic and atraumatic Eyes:  sclerae anicteric,conjunctive pink. Ears: Normal auditory acuity Mouth: No  deformity or lesions Neck: Supple, no masses.  Lungs: Clear throughout to auscultation Heart: Regular rate and rhythm; no murmurs heard Abdomen: Soft, nondistended, nontender. No masses or hepatomegaly noted. Normal bowel sounds Rectal: Not done Musculoskeletal: Symmetrical with no gross deformities  Skin: No lesions on visible extremities Extremities: No edema or deformities noted Neurological: Alert oriented x 4, grossly nonfocal Cervical Nodes:  No significant cervical adenopathy Psychological:  Alert and cooperative. Normal mood and affect  ASSESSMENT AND PLAN :

## 2011-08-13 NOTE — Patient Instructions (Signed)
Go to [facility] Radiology Department for a chest x-ray today. We scheduled the colonoscopy with Dr. Arlyce Dice for tomorrow 08-14-2011,. Directions provided. Come to our lab after the colonoscopy tomorrow the 6th. The lab is in our basement level.

## 2011-08-14 ENCOUNTER — Encounter: Payer: Self-pay | Admitting: Gastroenterology

## 2011-08-14 ENCOUNTER — Encounter: Payer: Self-pay | Admitting: Nurse Practitioner

## 2011-08-14 ENCOUNTER — Telehealth: Payer: Self-pay | Admitting: *Deleted

## 2011-08-14 ENCOUNTER — Other Ambulatory Visit (INDEPENDENT_AMBULATORY_CARE_PROVIDER_SITE_OTHER): Payer: Medicare Other

## 2011-08-14 ENCOUNTER — Ambulatory Visit (AMBULATORY_SURGERY_CENTER): Payer: Medicare Other | Admitting: Gastroenterology

## 2011-08-14 VITALS — BP 112/60 | HR 76 | Temp 98.3°F | Resp 21 | Ht 63.0 in | Wt 140.0 lb

## 2011-08-14 DIAGNOSIS — K5150 Left sided colitis without complications: Secondary | ICD-10-CM

## 2011-08-14 DIAGNOSIS — D126 Benign neoplasm of colon, unspecified: Secondary | ICD-10-CM

## 2011-08-14 DIAGNOSIS — R05 Cough: Secondary | ICD-10-CM

## 2011-08-14 DIAGNOSIS — E876 Hypokalemia: Secondary | ICD-10-CM

## 2011-08-14 DIAGNOSIS — R059 Cough, unspecified: Secondary | ICD-10-CM | POA: Insufficient documentation

## 2011-08-14 DIAGNOSIS — R197 Diarrhea, unspecified: Secondary | ICD-10-CM

## 2011-08-14 DIAGNOSIS — K5289 Other specified noninfective gastroenteritis and colitis: Secondary | ICD-10-CM

## 2011-08-14 LAB — COMPREHENSIVE METABOLIC PANEL
Albumin: 2.7 g/dL — ABNORMAL LOW (ref 3.5–5.2)
Alkaline Phosphatase: 97 U/L (ref 39–117)
BUN: 8 mg/dL (ref 6–23)
Calcium: 7.9 mg/dL — ABNORMAL LOW (ref 8.4–10.5)
Creatinine, Ser: 0.8 mg/dL (ref 0.4–1.2)
Glucose, Bld: 139 mg/dL — ABNORMAL HIGH (ref 70–99)
Potassium: 2 mEq/L — CL (ref 3.5–5.1)

## 2011-08-14 LAB — CBC WITH DIFFERENTIAL/PLATELET
Basophils Relative: 0.3 % (ref 0.0–3.0)
Eosinophils Absolute: 0.2 10*3/uL (ref 0.0–0.7)
HCT: 45.4 % (ref 36.0–46.0)
Hemoglobin: 15.3 g/dL — ABNORMAL HIGH (ref 12.0–15.0)
Lymphocytes Relative: 18.3 % (ref 12.0–46.0)
Lymphs Abs: 1.8 10*3/uL (ref 0.7–4.0)
MCHC: 33.7 g/dL (ref 30.0–36.0)
MCV: 95.3 fl (ref 78.0–100.0)
Neutro Abs: 7 10*3/uL (ref 1.4–7.7)
RBC: 4.77 Mil/uL (ref 3.87–5.11)
RDW: 14.1 % (ref 11.5–14.6)

## 2011-08-14 MED ORDER — BISMUTH SUBSALICYLATE 262 MG/15ML PO SUSP: 15.0000 mL | Freq: Four times a day (QID) | ORAL | Status: AC

## 2011-08-14 MED ORDER — SODIUM CHLORIDE 0.9 % IV SOLN: 500.0000 mL | INTRAVENOUS | Status: DC

## 2011-08-14 MED ORDER — BUDESONIDE 3 MG PO CP24: 9.0000 mg | ORAL_CAPSULE | ORAL | Status: DC

## 2011-08-14 NOTE — Assessment & Plan Note (Addendum)
Chronic (years) of diarrhea worked up in 2010. 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 For various reasons patient didn't start treatment until six weeks ago but she has had no response to Asacol and is in fact doing worse. She needs a full colonoscopy with repeat biopsies, hopefully to better characterize what type of colitis this is. The risks, benefits, and alternatives to colonoscopy with possible biopsy and possible polypectomy were discussed with the patient and they consent to proceed.

## 2011-08-14 NOTE — Assessment & Plan Note (Signed)
Vigorous coughing (phlegm like material). The cough is causing her to heave at times. Lungs sound okay on exam today. I have asked her to see her PCP regarding this cough. In the meantime will obtain CXR to rule out any acute processes since she will be undergoing colonoscopy in the am.

## 2011-08-14 NOTE — Patient Instructions (Signed)
Please begin the medications prescribed by Dr. Arlyce Dice.   They are at your pharmacy as we speak.   He faxed them in while you were here for your appointment.   Also resume your routine medications at this time.  Thank you for choosing [place] for your medical needs today.

## 2011-08-14 NOTE — Telephone Encounter (Signed)
Spoke with patient and she had not taken any Imodium today due to her colonoscopy But if she has diarrhea she will use it. She will come in AM for labs.

## 2011-08-14 NOTE — Telephone Encounter (Signed)
Spoke with patient as per Willette Cluster, NP. She cannot take large pills. Rx called in for liquid potassium. Patient to come in tomorrow for potassium level check. Left a message for patient to call me. Per Willette Cluster, NP, do not take over 16 mg of Imodium/day. If she is doing this, send rx  For Questran 1-2 times/day.

## 2011-08-15 ENCOUNTER — Telehealth: Payer: Self-pay | Admitting: Gastroenterology

## 2011-08-15 ENCOUNTER — Other Ambulatory Visit (INDEPENDENT_AMBULATORY_CARE_PROVIDER_SITE_OTHER): Payer: Medicare Other

## 2011-08-15 ENCOUNTER — Telehealth: Payer: Self-pay

## 2011-08-15 DIAGNOSIS — E876 Hypokalemia: Secondary | ICD-10-CM

## 2011-08-15 LAB — POTASSIUM: Potassium: 2 mEq/L — CL (ref 3.5–5.1)

## 2011-08-15 LAB — C-REACTIVE PROTEIN: CRP: 2.68 mg/dL — ABNORMAL HIGH (ref <0.60)

## 2011-08-15 MED ORDER — POTASSIUM CHLORIDE 40 MEQ/15ML (20%) PO LIQD: ORAL | Status: DC

## 2011-08-15 NOTE — Telephone Encounter (Signed)
Xray is normal

## 2011-08-15 NOTE — Progress Notes (Signed)
Reviewed and agree with management. Lennis Korb T. Emitt Maglione MD FACG 

## 2011-08-15 NOTE — Telephone Encounter (Signed)

## 2011-08-15 NOTE — Telephone Encounter (Signed)
Per Willette Cluster, NP, patient needs to take Potassium 40 MEQ QID today, tomorrow and Sunday. Repeat Potassium and Magnesium level on Monday AM. Spoke with patient and gave her the information above. Patient understands that if diarrhea starts back to take Imodium.

## 2011-08-15 NOTE — Telephone Encounter (Signed)
Tried to contact pt to inform chest xray is normal. No Answer

## 2011-08-15 NOTE — Telephone Encounter (Signed)
Dr Arlyce Dice, Pt called wants to know her results of her chest x-ray

## 2011-08-18 ENCOUNTER — Other Ambulatory Visit (INDEPENDENT_AMBULATORY_CARE_PROVIDER_SITE_OTHER): Payer: Medicare Other

## 2011-08-18 ENCOUNTER — Telehealth: Payer: Self-pay | Admitting: *Deleted

## 2011-08-18 DIAGNOSIS — E876 Hypokalemia: Secondary | ICD-10-CM

## 2011-08-18 LAB — MAGNESIUM: Magnesium: 1.5 mg/dL (ref 1.5–2.5)

## 2011-08-18 NOTE — Telephone Encounter (Signed)
Spoke with patient and reminded her to have labs repeated today.

## 2011-08-19 ENCOUNTER — Telehealth: Payer: Self-pay | Admitting: *Deleted

## 2011-08-19 MED ORDER — POTASSIUM CHLORIDE 20 MEQ/15ML (10%) PO LIQD: ORAL | Status: DC

## 2011-08-19 NOTE — Telephone Encounter (Signed)
Message copied by Daphine Deutscher on Tue Aug 19, 2011  2:43 PM ------      Message from: Meredith Pel      Created: Mon Aug 18, 2011  6:10 PM       Rene Kocher, her potassium is now normal. Please ask her to take daily for 3 days then she can stop (assuming she is no longer having diarrhea). I do not have her biopsy report back if she asks.  Please make sure she has a follow up appt. With [place]. Thanks.

## 2011-08-19 NOTE — Telephone Encounter (Signed)
Spoke with patient and gave her Paula Guenther, NP recommendations. 

## 2011-08-19 NOTE — Telephone Encounter (Signed)
Left a message for patient to call me. Patient is scheduled for OVon 08/21/11 already.Rx sent to pharmacy,

## 2011-08-21 ENCOUNTER — Ambulatory Visit: Payer: Medicare Other | Admitting: Gastroenterology

## 2011-08-21 ENCOUNTER — Telehealth: Payer: Self-pay | Admitting: Gastroenterology

## 2011-08-21 NOTE — Telephone Encounter (Signed)
Pt wanted to know if she should continue her medications since her appt was cancelled. Pt instructed to continue taking the medicines until she sees Dr. Arlyce Dice. Pt verbalized understanding.

## 2011-08-25 ENCOUNTER — Other Ambulatory Visit: Payer: Self-pay | Admitting: Gastroenterology

## 2011-08-25 ENCOUNTER — Telehealth: Payer: Self-pay

## 2011-08-25 DIAGNOSIS — R197 Diarrhea, unspecified: Secondary | ICD-10-CM

## 2011-08-25 NOTE — Telephone Encounter (Signed)
Pt aware and will try to come to the lab today.

## 2011-08-25 NOTE — Telephone Encounter (Signed)
Message copied by Michele Mcalpine on Mon Aug 25, 2011  9:34 AM ------      Message from: Inocente Salles      Created: Fri Aug 22, 2011 11:26 AM                   ----- Message -----         From: Louis Meckel, MD         Sent: 08/21/2011   7:02 PM           To: Weston Brass, RN            Per pathology findings, she needs a stool c & s, c. dificile , and O&P

## 2011-08-26 ENCOUNTER — Telehealth: Payer: Self-pay | Admitting: Gastroenterology

## 2011-08-26 NOTE — Telephone Encounter (Signed)
Left message for pt to call back.  Pt wanted to know if she was supposed to stop her medication. Informed pt that Dr. Arlyce Dice just ordered the stool studies, he did not mention her stopping any medications. Pt verbalized understanding.

## 2011-08-28 ENCOUNTER — Other Ambulatory Visit: Payer: Medicare Other

## 2011-08-28 DIAGNOSIS — R197 Diarrhea, unspecified: Secondary | ICD-10-CM

## 2011-08-29 ENCOUNTER — Telehealth: Payer: Self-pay | Admitting: Gastroenterology

## 2011-08-29 ENCOUNTER — Other Ambulatory Visit: Payer: Self-pay | Admitting: Gastroenterology

## 2011-08-29 LAB — OVA AND PARASITE EXAMINATION: OP: NONE SEEN

## 2011-08-29 LAB — CLOSTRIDIUM DIFFICILE BY PCR: Toxigenic C. Difficile by PCR: NOT DETECTED

## 2011-08-29 NOTE — Telephone Encounter (Signed)
Spoke with pt and let her know the stool specimen/cultures are not back. Pt instructed to call her pharmacy for refill on medication.

## 2011-09-01 ENCOUNTER — Telehealth: Payer: Self-pay | Admitting: Gastroenterology

## 2011-09-01 LAB — STOOL CULTURE

## 2011-09-01 NOTE — Telephone Encounter (Signed)
Pt states that she is still nauseated and getting sick every am, states she is vomiting what appears to be mucous. The diarrhea is better than it was. Pt states that she stopped taking the entocort 4 days ago because she didn't think it was helping. States she has not appetite. Asked pt if she felt worse since stopping the entocort and she really couldn't tell. Please advise.

## 2011-09-01 NOTE — Telephone Encounter (Signed)
She needs an office visit 

## 2011-09-01 NOTE — Telephone Encounter (Signed)
Stool cultures were all negative. She still having diarrhea. I assume that she is still on Entocort.

## 2011-09-01 NOTE — Telephone Encounter (Signed)
Pt is calling wanting to know what her stool tests/cultures showed. She is nervous and wants to make sure she shouldn't be taking an antibiotic. Dr. Arlyce Dice please advise.

## 2011-09-02 ENCOUNTER — Telehealth: Payer: Self-pay | Admitting: Gastroenterology

## 2011-09-02 NOTE — Telephone Encounter (Signed)
See previous telephone note, Dr. Arlyce Dice aware pt stopped her entocort. She has follow-up appt 09/12/11.

## 2011-09-02 NOTE — Telephone Encounter (Signed)
Pt scheduled to see Dr. Arlyce Dice 09/12/11@2 :30pm. Pt aware of appt date and time.

## 2011-09-12 ENCOUNTER — Encounter: Payer: Self-pay | Admitting: Gastroenterology

## 2011-09-12 ENCOUNTER — Ambulatory Visit (INDEPENDENT_AMBULATORY_CARE_PROVIDER_SITE_OTHER): Payer: Medicare Other | Admitting: Gastroenterology

## 2011-09-12 VITALS — BP 140/84 | HR 100 | Ht 61.75 in | Wt 141.6 lb

## 2011-09-12 DIAGNOSIS — R112 Nausea with vomiting, unspecified: Secondary | ICD-10-CM

## 2011-09-12 DIAGNOSIS — K5289 Other specified noninfective gastroenteritis and colitis: Secondary | ICD-10-CM

## 2011-09-12 DIAGNOSIS — K222 Esophageal obstruction: Secondary | ICD-10-CM

## 2011-09-12 MED ORDER — BISMUTH SUBSALICYLATE 262 MG PO CHEW: CHEWABLE_TABLET | ORAL | Status: DC

## 2011-09-12 NOTE — Assessment & Plan Note (Addendum)
Chronic diarrhea and is no doubt related to colitis. 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 Therapy with mesalamine and entocort have been unsuccessful.  Recommendations #1 trial of Pepto-Bismol

## 2011-09-12 NOTE — Patient Instructions (Signed)
Discontinue Zegerid. Begin Pepto-Bismol for diarrhea. Call back in 5 days if nausea is not improved.

## 2011-09-12 NOTE — Progress Notes (Signed)
History of Present Illness:  Theresa [name] has returned for followup of diarrhea. Diarrhea has not improved with therapies including Anticort, lialda and asacol.  Recent colonoscopy was negative. Random biopsies showed chronic inflammation that appeared more suggestive of an infection than chronic colitis. Stool cultures were negative.  She is complaining of chronic nausea. She's lost 10 pounds. Nausea occurs throughout the day. She no longer has dysphagia.    Review of Systems: Pertinent positive and negative review of systems were noted in the above HPI section. All other review of systems were otherwise negative.    Current Medications, Allergies, Past Medical History, Past Surgical History, Family History and Social History were reviewed in [employer] electronic medical record  Vital signs were reviewed in today's medical record. Physical Exam: General: Well developed , well nourished, no acute distress

## 2011-09-12 NOTE — Assessment & Plan Note (Signed)
Plan repeat dilatation when necessary 

## 2011-09-12 NOTE — Assessment & Plan Note (Addendum)
I am suspicious this is medicine-related. She's been on sertraline for over 10 years. Zegerid could be the culprit. Symptoms preceded her use of mesalamine.  Recommendations #1 hold Zegerid

## 2011-09-15 ENCOUNTER — Telehealth: Payer: Self-pay | Admitting: Gastroenterology

## 2011-09-15 NOTE — Telephone Encounter (Signed)
Pt called and states that she has a friend that was just diagnosed with adrenal gland cancer. She states that they could not find out what was wrong with her and they did a ct scan and found the cancer. Pt states that she would feel much better if we would order a ct scan for her to make sure she doesn't have adrenal gland cancer like her friend. Dr. Arlyce Dice please advise.

## 2011-09-15 NOTE — Telephone Encounter (Signed)
Last CT was 7/10 which very well visualized her adrenal glands.   There is no indication for any adrenal gland abnormality and any findings would be entirely conincidental.  Also, I don't think medicare would pay for the scan. She can be reassured since her scan was only 2 years ago.  No need to repeat.

## 2011-09-15 NOTE — Telephone Encounter (Signed)
Pt aware.

## 2011-09-18 ENCOUNTER — Ambulatory Visit: Payer: Medicare Other | Admitting: Gastroenterology

## 2011-09-22 ENCOUNTER — Telehealth: Payer: Self-pay | Admitting: Gastroenterology

## 2011-09-22 NOTE — Telephone Encounter (Signed)
Left message for pt to call back  °

## 2011-09-23 NOTE — Telephone Encounter (Signed)
DT discontinue Zegerid as instructed? If she did and she is no better and she is going to have to talk to her PCP about tapering and then discontinue sertraline as a possible cause for nausea. Continue Pepto-Bismol.

## 2011-09-23 NOTE — Telephone Encounter (Signed)
Lets get a gastric emptying scan

## 2011-09-23 NOTE — Telephone Encounter (Signed)
Pt states she is still having some diarrhea, it is not as bad as it was. Pt states she is still nauseated and does not feel like eating. States she is taking Zofran 4mg  for the nausea and pepto bismol. Pt wants to know what else she can do, does not feel well. Please advise.

## 2011-09-24 ENCOUNTER — Other Ambulatory Visit: Payer: Self-pay | Admitting: Gastroenterology

## 2011-09-24 NOTE — Telephone Encounter (Signed)
Pt scheduled for [facility] at [facility] 10/06/11 arrive at [facility] at 9:45am for a 10am appt. Pt to be NPO after midnight and to hold her pepto bismol after midnight. Pt aware of appt date and time.

## 2011-09-29 ENCOUNTER — Ambulatory Visit
Admission: RE | Admit: 2011-09-29 | Discharge: 2011-09-29 | Disposition: A | Discharge status: Home / Self Care | Payer: Medicare Other | Source: Ambulatory Visit | Attending: Family Medicine | Admitting: Family Medicine

## 2011-09-29 ENCOUNTER — Other Ambulatory Visit: Payer: Self-pay | Admitting: Family Medicine

## 2011-09-29 DIAGNOSIS — T1490XA Injury, unspecified, initial encounter: Secondary | ICD-10-CM

## 2011-09-30 ENCOUNTER — Other Ambulatory Visit: Payer: Self-pay | Admitting: Family Medicine

## 2011-10-06 ENCOUNTER — Other Ambulatory Visit: Payer: Self-pay | Admitting: Gastroenterology

## 2011-10-06 ENCOUNTER — Encounter (HOSPITAL_COMMUNITY)
Admission: RE | Admit: 2011-10-06 | Discharge: 2011-10-06 | Disposition: A | Discharge status: Home / Self Care | Payer: Medicare Other | Source: Ambulatory Visit | Attending: Gastroenterology | Admitting: Gastroenterology

## 2011-10-13 ENCOUNTER — Ambulatory Visit (INDEPENDENT_AMBULATORY_CARE_PROVIDER_SITE_OTHER): Payer: Medicare Other | Admitting: Gastroenterology

## 2011-10-13 ENCOUNTER — Encounter: Payer: Self-pay | Admitting: Gastroenterology

## 2011-10-13 ENCOUNTER — Encounter (HOSPITAL_COMMUNITY): Admission: RE | Admit: 2011-10-13 | Payer: Medicare Other | Source: Ambulatory Visit

## 2011-10-13 VITALS — BP 122/78 | HR 72 | Ht 62.0 in | Wt 141.0 lb

## 2011-10-13 DIAGNOSIS — R110 Nausea: Secondary | ICD-10-CM

## 2011-10-13 DIAGNOSIS — R197 Diarrhea, unspecified: Secondary | ICD-10-CM

## 2011-10-13 MED ORDER — METOCLOPRAMIDE HCL 10 MG PO TABS: ORAL_TABLET | ORAL | Status: DC

## 2011-10-13 NOTE — Patient Instructions (Addendum)
You will need to make a follow up appointment for 1 month  Patient was instructed to contact me immediately  if she develops any side effects from her Reglan including paresthesias, tremors, confusion , weakness or muscle spasms.

## 2011-10-13 NOTE — Assessment & Plan Note (Addendum)
This continues to be a severe problem. Unfortunately, she was unable to complete a gastric emptying scan. I remain suspicious that this is a medication effect (zoloft) although she has been on the medication for years. Gastroparesis is also a concern.  Recommendations #1 empiric trial of Reglan 10 mg one hour a.c. and at bedtime; patient was instructed to call back in one week. If she's not improved then I will lower her Zoloft

## 2011-10-13 NOTE — Assessment & Plan Note (Signed)
She appears to have had some response to Pepto-Bismol. Will continue with the same. She also takes Imodium before going out which seems to check her diarrhea.

## 2011-10-13 NOTE — Progress Notes (Signed)
History of Present Illness:  Theresa [name] has returned for followup of nausea and diarrhea. On Pepto-Bismol diarrhea has improved. She has 3-4 bowel movements a day that are usually postprandial. She has stopped asacol and  PPIs but still has nausea. She complains of anorexia. She is without pain.    Review of Systems: Pertinent positive and negative review of systems were noted in the above HPI section. All other review of systems were otherwise negative.    Current Medications, Allergies, Past Medical History, Past Surgical History, Family History and Social History were reviewed in [employer] electronic medical record  Vital signs were reviewed in today's medical record. Physical Exam: General: Well developed , well nourished, no acute distress

## 2011-10-22 ENCOUNTER — Other Ambulatory Visit: Payer: Self-pay | Admitting: Gastroenterology

## 2011-11-11 ENCOUNTER — Ambulatory Visit: Payer: Medicare Other | Admitting: Gastroenterology

## 2012-01-15 ENCOUNTER — Telehealth: Payer: Self-pay | Admitting: Internal Medicine

## 2012-01-15 ENCOUNTER — Telehealth: Payer: Self-pay | Admitting: *Deleted

## 2012-01-15 NOTE — Telephone Encounter (Signed)
pt had called and i called her back as she needed to r/s her 2/25-2/27 appt,appts changed to 3/5 and 3/7    aom

## 2012-01-15 NOTE — Telephone Encounter (Signed)
Message left on [facility] voicemail at 3:25 pm today requesting to reschedule February f/u appointments.  Reports she has a conflict. Called patient at this time informing her I've forwarded this message to schedulers.  Asked that the date be changed earlier or later but she can't come in 02-02-12 for lab or 02-04-12 for f/u.  Also says someone was trying to determine if she needs a chest xray.  Informed her scheduler's turn around time is 24 to 48 hrs and someone will be in touch.

## 2012-02-02 ENCOUNTER — Other Ambulatory Visit: Payer: Medicare Other | Admitting: Lab

## 2012-02-04 ENCOUNTER — Ambulatory Visit: Payer: Medicare Other | Admitting: Internal Medicine

## 2012-02-10 ENCOUNTER — Other Ambulatory Visit (HOSPITAL_BASED_OUTPATIENT_CLINIC_OR_DEPARTMENT_OTHER): Payer: Medicare Other | Admitting: Lab

## 2012-02-10 DIAGNOSIS — C50919 Malignant neoplasm of unspecified site of unspecified female breast: Secondary | ICD-10-CM

## 2012-02-10 LAB — CBC WITH DIFFERENTIAL/PLATELET
Basophils Absolute: 0 10*3/uL (ref 0.0–0.1)
EOS%: 0.8 % (ref 0.0–7.0)
Eosinophils Absolute: 0.1 10*3/uL (ref 0.0–0.5)
LYMPH%: 26.4 % (ref 14.0–49.7)
MCH: 33.8 pg (ref 25.1–34.0)
MCV: 100.5 fL (ref 79.5–101.0)
MONO%: 6.5 % (ref 0.0–14.0)
NEUT#: 7.2 10*3/uL — ABNORMAL HIGH (ref 1.5–6.5)
Platelets: 282 10*3/uL (ref 145–400)
RBC: 4.1 10*6/uL (ref 3.70–5.45)

## 2012-02-10 LAB — COMPREHENSIVE METABOLIC PANEL
Alkaline Phosphatase: 76 U/L (ref 39–117)
BUN: 10 mg/dL (ref 6–23)
Glucose, Bld: 116 mg/dL — ABNORMAL HIGH (ref 70–99)
Sodium: 140 mEq/L (ref 135–145)
Total Bilirubin: 0.7 mg/dL (ref 0.3–1.2)

## 2012-02-12 ENCOUNTER — Ambulatory Visit (HOSPITAL_BASED_OUTPATIENT_CLINIC_OR_DEPARTMENT_OTHER): Payer: Medicare Other | Admitting: Internal Medicine

## 2012-02-12 VITALS — BP 126/78 | HR 123 | Temp 98.8°F | Ht 62.0 in | Wt 141.0 lb

## 2012-02-12 DIAGNOSIS — C50919 Malignant neoplasm of unspecified site of unspecified female breast: Secondary | ICD-10-CM

## 2012-02-12 MED ORDER — POTASSIUM CHLORIDE CRYS ER 20 MEQ PO TBCR: 20.0000 meq | EXTENDED_RELEASE_TABLET | Freq: Every day | ORAL | Status: DC

## 2012-02-15 NOTE — Progress Notes (Signed)
[facility] [phone]   [phone]  OFFICE PROGRESS NOTE  Theresa Floro, MD, MD [address] 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  PRIOR THERAPY: 1. Status post right breast lumpectomy with sentinel lymph node biopsies on January 08, 2001, revealing a 1.2 cm infiltrating ductal carcinoma.  The tumor was positive for estrogen and progesterone receptors and negative for HER-2/neu.   2. Status post postoperative radiotherapy to the right breast.  CURRENT THERAPY:  Arimidex 1 mg p.o. daily, started May 2002.    INTERVAL HISTORY: Theresa [name] 66 y.o. female returns to the clinic today for annual followup visit. The patient is doing well today. She denied having any significant chest pain or shortness of breath, no cough or hemoptysis. No weight loss or night sweats. Her mammogram scheduled to be done in June of 2013. The patient has repeat CBC, cemented and CA 27.29 performed recently and she is here for evaluation and discussion of her lab results. She had chest x-ray on 08/13/2011 that showed no acute disease. CT of the head without contrast on 09/29/2011 was negative for bleed or other acute intracranial process.  MEDICAL HISTORY: Past Medical History  Diagnosis Date  . Stricture and stenosis of esophagus   . Other and unspecified hyperlipidemia   . Dysthymic disorder   . Other and unspecified coagulation defects   . Malignant neoplasm of breast (female), unspecified site   . Diaphragmatic hernia without mention of obstruction or gangrene   . PAD (peripheral artery disease)   . Colitis, ulcerative     Left sided  . PVD (peripheral vascular disease)     ALLERGIES:  is allergic to ciprofloxacin and sulfonamide derivatives.  MEDICATIONS:  Current Outpatient Prescriptions  Medication Sig Dispense Refill  . ALPRAZolam (XANAX)  0.25 MG tablet Take 0.25 mg by mouth at bedtime as needed.        [name] aspirin 81 MG EC tablet Take 81 mg by mouth daily.        [name] bismuth subsalicylate (PEPTO-BISMOL) 262 MG chewable tablet Take 3 tablets 3 times a day  30 tablet  5  . ibuprofen (ADVIL) 200 MG tablet Take 200 mg by mouth every 6 (six) hours as needed.      [name] omeprazole (PRILOSEC) 20 MG capsule Take 20 mg by mouth daily.      . Probiotic Product (PROBIOTIC FORMULA PO) Take by mouth daily. OTC probiotic      . sertraline (ZOLOFT) 100 MG tablet Take 50 mg by mouth daily.       . ASACOL 400 MG EC tablet take 2 tablets by mouth three times a day  90 tablet  1  . clobetasol (TEMOVATE) 0.05 % cream       . diphenoxylate-atropine (LOMOTIL) 2.5-0.025 MG per tablet Take 1 tablet by mouth 4 (four) times daily as needed.        . ondansetron (ZOFRAN) 4 MG tablet take 1 tablet by mouth every 8 hours if needed for nausea  30 tablet  3  . potassium chloride 20 MEQ/15ML (10%) solution Take 20 MEQ daily x 3 days  45 mL  0  . potassium chloride SA (K-DUR,KLOR-CON) 20 MEQ tablet Take 1 tablet (20 mEq total) by mouth daily.  10 tablet  0   Current Facility-Administered Medications  Medication Dose Route Frequency Provider Last Rate Last Dose  .  0.9 %  sodium chloride infusion  500 mL Intravenous Continuous Louis Meckel, MD        SURGICAL HISTORY:  Past Surgical History  Procedure Date  . Cholecystectomy   . Appendectomy   . Tubal ligation   . Breast lumpectomy     Right    REVIEW OF SYSTEMS:  A comprehensive review of systems was negative.   PHYSICAL EXAMINATION: General appearance: alert, cooperative and no distress Neck: no adenopathy Lymph nodes: Cervical, supraclavicular, and axillary nodes normal. Resp: clear to auscultation bilaterally Cardio: regular rate and rhythm, S1, S2 normal, no murmur, click, rub or gallop GI: soft, non-tender; bowel sounds normal; no masses,  no organomegaly Extremities: extremities normal,  atraumatic, no cyanosis or edema Neurologic: Alert and oriented X 3, normal strength and tone. Normal symmetric reflexes. Normal coordination and gait Breast exam performance with the chaprone of my nurse Kathlee Nations showed no palpable masses in the breasts bilaterally and no palpable axillary lymphadenopathy ECOG PERFORMANCE STATUS: 0 - Asymptomatic  Blood pressure 126/78, pulse 123, temperature 98.8 F (37.1 C), temperature source Oral, height 5\' 2"  (1.575 m), weight 141 lb (63.957 kg).  LABORATORY DATA: Lab Results  Component Value Date   WBC 10.9* 02/10/2012   HGB 13.9 02/10/2012   HCT 41.3 02/10/2012   MCV 100.5 02/10/2012   PLT 282 02/10/2012      Chemistry      Component Value Date/Time   NA 140 02/10/2012 1409   K 3.1* 02/10/2012 1409   CL 101 02/10/2012 1409   CO2 24 02/10/2012 1409   BUN 10 02/10/2012 1409   CREATININE 0.80 02/10/2012 1409      Component Value Date/Time   CALCIUM 9.2 02/10/2012 1409   ALKPHOS 76 02/10/2012 1409   AST 33 02/10/2012 1409   ALT 15 02/10/2012 1409   BILITOT 0.7 02/10/2012 1409     CA 27.29 was 34  RADIOGRAPHIC STUDIES: No results found.  ASSESSMENT: This is a very pleasant 66 years old white female with stage I breast infiltrative ductal carcinoma diagnosed in February of 2002 status post right breast lumpectomy with sentinel lymph node biopsy followed by postoperative radiotherapy to the right breast and treatment with Arimidex for almost 10 years. The patient is doing fine and she has no evidence for disease recurrence.   PLAN: I discussed the lab result with the patient and recommended for her continuous observation for now. She would have repeat CBC, comprehensive metabolic panel and CEA 27.29 in addition to a chest x-ray in one year. She would come back for followup visit at that time. The patient was advised to call me immediately if she has any concerning symptoms in the interval.  All questions were answered. The patient knows to call the clinic  with any problems, questions or concerns. We can certainly see the patient much sooner if necessary.

## 2012-03-16 ENCOUNTER — Encounter: Payer: Self-pay | Admitting: Neurosurgery

## 2012-03-17 ENCOUNTER — Ambulatory Visit (INDEPENDENT_AMBULATORY_CARE_PROVIDER_SITE_OTHER): Payer: Medicare Other | Admitting: Neurosurgery

## 2012-03-17 ENCOUNTER — Encounter: Payer: Self-pay | Admitting: Neurosurgery

## 2012-03-17 ENCOUNTER — Ambulatory Visit (INDEPENDENT_AMBULATORY_CARE_PROVIDER_SITE_OTHER): Payer: Medicare Other | Admitting: Vascular Surgery

## 2012-03-17 VITALS — BP 132/90 | HR 77 | Resp 16 | Ht 62.0 in | Wt 144.3 lb

## 2012-03-17 DIAGNOSIS — I70219 Atherosclerosis of native arteries of extremities with intermittent claudication, unspecified extremity: Secondary | ICD-10-CM | POA: Insufficient documentation

## 2012-03-17 NOTE — Progress Notes (Signed)
[facility] HISTORY AND PHYSICAL   CC: Annual lower arterial ABIs for known claudication Referring Physician: Edilia Bo  History of Present Illness: 66 year old female patient of Dr. Edilia Bo seen for a known lower extremity claudication. The patient states he feels like her claudicate symptoms have worsened somewhat. She can walk about half a block at a time without rest. She reports no rest pain and no lower extremity ulcers, no new medical problems no surgeries.  Past Medical History  Diagnosis Date  . Stricture and stenosis of esophagus   . Other and unspecified hyperlipidemia   . Dysthymic disorder   . Other and unspecified coagulation defects   . Malignant neoplasm of breast (female), unspecified site   . Diaphragmatic hernia without mention of obstruction or gangrene   . PAD (peripheral artery disease)   . Colitis, ulcerative     Left sided  . PVD (peripheral vascular disease)     ROS: [x]  Positive   [ ]  Denies    General: [ ]  Weight loss, [ ]  Fever, [ ]  chills Neurologic: [ ]  Dizziness, [ ]  Blackouts, [ ]  Seizure [ ]  Stroke, [ ]  "Mini stroke", [ ]  Slurred speech, [ ]  Temporary blindness; [ ]  weakness in arms or legs, [ ]  Hoarseness Cardiac: [ ]  Chest pain/pressure, [ ]  Shortness of breath at rest [ ]  Shortness of breath with exertion, [ ]  Atrial fibrillation or irregular heartbeat Vascular: [ ]  Pain in legs with walking, [ ]  Pain in legs at rest, [ ]  Pain in legs at night,  [ ]  Non-healing ulcer, [ ]  Blood clot in vein/DVT,   Pulmonary: [ ]  Home oxygen, [ ]  Productive cough, [ ]  Coughing up blood, [ ]  Asthma,  [ ]  Wheezing Musculoskeletal:  [ ]  Arthritis, [ ]  Low back pain, [ ]  Joint pain Hematologic: [ ]  Easy Bruising, [ ]  Anemia; [ ]  Hepatitis Gastrointestinal: [ ]  Blood in stool, [ ]  Gastroesophageal Reflux/heartburn, [ ]  Trouble swallowing Urinary: [ ]  chronic Kidney disease, [ ]  on HD - [ ]  MWF or [ ]  TTHS, [ ]  Burning with urination, [ ]   Difficulty urinating Skin: [ ]  Rashes, [ ]  Wounds Psychological: [ ]  Anxiety, [ ]  Depression   Social History History  Substance Use Topics  . Smoking status: Former Smoker    Quit date: 12/08/2002  . Smokeless tobacco: Never Used  . Alcohol Use: 7.0 oz/week    14 drink(s) per week     2 drinks per night    Family History Family History  Problem Relation Age of Onset  . Heart disease Father   . Colon cancer Neg Hx   . Lung cancer Maternal Grandfather     Allergies  Allergen Reactions  . Ciprofloxacin     Hives   . Sulfonamide Derivatives     Current Outpatient Prescriptions  Medication Sig Dispense Refill  . ALPRAZolam (XANAX) 0.25 MG tablet Take 0.25 mg by mouth at bedtime as needed.        . ASACOL 400 MG EC tablet take 2 tablets by mouth three times a day  90 tablet  1  . aspirin 81 MG EC tablet Take 81 mg by mouth daily.        [name] bismuth subsalicylate (PEPTO-BISMOL) 262 MG chewable tablet Take 3 tablets 3 times a day  30 tablet  5  . clobetasol (TEMOVATE) 0.05 % cream       . diphenoxylate-atropine (LOMOTIL) 2.5-0.025 MG per tablet Take  1 tablet by mouth 4 (four) times daily as needed.        [name] ibuprofen (ADVIL) 200 MG tablet Take 200 mg by mouth every 6 (six) hours as needed.      [name] omeprazole (PRILOSEC) 20 MG capsule Take 20 mg by mouth daily.      . potassium chloride 20 MEQ/15ML (10%) solution Take 20 MEQ daily x 3 days  45 mL  0  . potassium chloride SA (K-DUR,KLOR-CON) 20 MEQ tablet Take 1 tablet (20 mEq total) by mouth daily.  10 tablet  0  . Probiotic Product (PROBIOTIC FORMULA PO) Take by mouth daily. OTC probiotic      . sertraline (ZOLOFT) 100 MG tablet Take 50 mg by mouth daily.       . fluocinonide (LIDEX) 0.05 % external solution       . imiquimod (ALDARA) 5 % cream       . ketoconazole (NIZORAL) 2 % shampoo       . ondansetron (ZOFRAN) 4 MG tablet take 1 tablet by mouth every 8 hours if needed for nausea  30 tablet  3  . DISCONTD: lansoprazole  (PREVACID) 30 MG capsule Take 30 mg by mouth daily.        [name] DISCONTD: metoCLOPramide (REGLAN) 10 MG tablet Take one tab one half hour before meals and at bedtime  40 tablet  3   Current Facility-Administered Medications  Medication Dose Route Frequency Provider Last Rate Last Dose  . 0.9 %  sodium chloride infusion  500 mL Intravenous Continuous Louis Meckel, MD        Physical Examination  Filed Vitals:   03/17/12 1359  BP: 132/90  Pulse: 77  Resp: 16    Body mass index is 26.39 kg/(m^2).  General:  WDWN in NAD Gait: Normal HEENT: WNL Eyes: Pupils equal Pulmonary: normal non-labored breathing , without Rales, rhonchi,  wheezing Cardiac: RRR, without  Murmurs, rubs or gallops; No carotid bruits Abdomen: soft, NT, no masses Skin: no rashes, ulcers noted Vascular Exam/Pulses: Patient has 2+ bilateral radial pulses, DP and PT are not palpable in either extremity I also cannot palpate popliteal pulses however she does have palpable femoral pulses  Extremities without ischemic changes, no Gangrene , no cellulitis; no open wounds;  Musculoskeletal: no muscle wasting or atrophy  Neurologic: A&O X 3; Appropriate Affect ; SENSATION: normal; MOTOR FUNCTION:  moving all extremities equally. Speech is fluent/normal  Non-Invasive Vascular Imaging: Today her ABI is 0.76 normal right 0.93 on the left previously April 2012 on the right she was 0.83 on the left 0.86  ASSESSMENT/PLAN: Assessment as above, per the patient's request we will bring her back in 6 months and repeat the lower extremity ABIs, she knows to let us know if her symptoms worsen before that time, she'll continue her 81 mg a day aspirin, her questions were encouraged and answered   [facility] ANP  Clinic M.D.: Edilia Bo

## 2012-03-18 NOTE — Progress Notes (Signed)
Addended by: Sharee Pimple on: 03/18/2012 03:42 PM   Modules accepted: Orders

## 2012-06-20 ENCOUNTER — Emergency Department (HOSPITAL_COMMUNITY)
Admission: EM | Admit: 2012-06-20 | Discharge: 2012-06-20 | Disposition: A | Discharge status: Home / Self Care | Payer: Medicare Other | Attending: Emergency Medicine | Admitting: Emergency Medicine

## 2012-06-20 ENCOUNTER — Encounter (HOSPITAL_COMMUNITY): Payer: Self-pay | Admitting: *Deleted

## 2012-06-20 ENCOUNTER — Inpatient Hospital Stay (HOSPITAL_COMMUNITY)
Admission: EM | Admit: 2012-06-20 | Discharge: 2012-06-23 | DRG: 872 | Disposition: A | Discharge status: Home Health | Payer: Medicare Other | Attending: Internal Medicine | Admitting: Internal Medicine

## 2012-06-20 DIAGNOSIS — N12 Tubulo-interstitial nephritis, not specified as acute or chronic: Secondary | ICD-10-CM

## 2012-06-20 DIAGNOSIS — A419 Sepsis, unspecified organism: Principal | ICD-10-CM | POA: Diagnosis present

## 2012-06-20 DIAGNOSIS — R112 Nausea with vomiting, unspecified: Secondary | ICD-10-CM | POA: Diagnosis present

## 2012-06-20 DIAGNOSIS — K5150 Left sided colitis without complications: Secondary | ICD-10-CM

## 2012-06-20 DIAGNOSIS — C50919 Malignant neoplasm of unspecified site of unspecified female breast: Secondary | ICD-10-CM

## 2012-06-20 DIAGNOSIS — R1013 Epigastric pain: Secondary | ICD-10-CM

## 2012-06-20 DIAGNOSIS — F341 Dysthymic disorder: Secondary | ICD-10-CM | POA: Diagnosis present

## 2012-06-20 DIAGNOSIS — K219 Gastro-esophageal reflux disease without esophagitis: Secondary | ICD-10-CM

## 2012-06-20 DIAGNOSIS — R7881 Bacteremia: Secondary | ICD-10-CM | POA: Diagnosis present

## 2012-06-20 DIAGNOSIS — Z79899 Other long term (current) drug therapy: Secondary | ICD-10-CM | POA: Insufficient documentation

## 2012-06-20 DIAGNOSIS — D72829 Elevated white blood cell count, unspecified: Secondary | ICD-10-CM | POA: Diagnosis present

## 2012-06-20 DIAGNOSIS — F329 Major depressive disorder, single episode, unspecified: Secondary | ICD-10-CM | POA: Diagnosis present

## 2012-06-20 DIAGNOSIS — R197 Diarrhea, unspecified: Secondary | ICD-10-CM | POA: Diagnosis present

## 2012-06-20 DIAGNOSIS — E785 Hyperlipidemia, unspecified: Secondary | ICD-10-CM | POA: Diagnosis present

## 2012-06-20 DIAGNOSIS — E876 Hypokalemia: Secondary | ICD-10-CM | POA: Insufficient documentation

## 2012-06-20 DIAGNOSIS — Z7982 Long term (current) use of aspirin: Secondary | ICD-10-CM | POA: Insufficient documentation

## 2012-06-20 DIAGNOSIS — Z9089 Acquired absence of other organs: Secondary | ICD-10-CM | POA: Insufficient documentation

## 2012-06-20 DIAGNOSIS — F3289 Other specified depressive episodes: Secondary | ICD-10-CM | POA: Diagnosis present

## 2012-06-20 DIAGNOSIS — I959 Hypotension, unspecified: Secondary | ICD-10-CM | POA: Diagnosis present

## 2012-06-20 DIAGNOSIS — I739 Peripheral vascular disease, unspecified: Secondary | ICD-10-CM

## 2012-06-20 DIAGNOSIS — F411 Generalized anxiety disorder: Secondary | ICD-10-CM | POA: Diagnosis present

## 2012-06-20 LAB — URINALYSIS, ROUTINE W REFLEX MICROSCOPIC
Protein, ur: 100 mg/dL — AB
Urobilinogen, UA: 1 mg/dL (ref 0.0–1.0)

## 2012-06-20 LAB — BASIC METABOLIC PANEL
Calcium: 9.1 mg/dL (ref 8.4–10.5)
GFR calc Af Amer: >90 mL/min (ref >90)
GFR calc non Af Amer: >90 mL/min (ref >90)
Sodium: 135 mEq/L (ref 135–145)

## 2012-06-20 LAB — LACTIC ACID, PLASMA
Lactic Acid, Venous: 2.8 mmol/L — ABNORMAL HIGH (ref 0.5–2.2)
Lactic Acid, Venous: 2.5 mmol/L — ABNORMAL HIGH (ref 0.5–2.2)

## 2012-06-20 LAB — CBC WITH DIFFERENTIAL/PLATELET
Basophils Absolute: 0 10*3/uL (ref 0.0–0.1)
Basophils Relative: 0 % (ref 0–1)
Eosinophils Absolute: 0 10*3/uL (ref 0.0–0.7)
Eosinophils Relative: 0 % (ref 0–5)
Lymphocytes Relative: 7 % — ABNORMAL LOW (ref 12–46)
MCH: 32.9 pg (ref 26.0–34.0)
MCHC: 34.8 g/dL (ref 30.0–36.0)
MCV: 94.5 fL (ref 78.0–100.0)
Platelets: 197 10*3/uL (ref 150–400)
RDW: 13.6 % (ref 11.5–15.5)
WBC: 14.6 10*3/uL — ABNORMAL HIGH (ref 4.0–10.5)

## 2012-06-20 LAB — URINE MICROSCOPIC-ADD ON

## 2012-06-20 MED ORDER — SODIUM CHLORIDE 0.9 % IV BOLUS (SEPSIS): 500.0000 mL | Freq: Once | INTRAVENOUS | Status: DC

## 2012-06-20 MED ORDER — DEXTROSE 5 % IV SOLN
1.0000 g | Freq: Once | INTRAVENOUS | Status: AC
  Administered 2012-06-20: 1 g via INTRAVENOUS
  Filled 2012-06-20: qty 10

## 2012-06-20 MED ORDER — POTASSIUM CHLORIDE 20 MEQ/15ML (10%) PO LIQD
40.0000 meq | Freq: Once | ORAL | Status: AC
  Administered 2012-06-20: 40 meq via ORAL
  Filled 2012-06-20: qty 30

## 2012-06-20 MED ORDER — SODIUM CHLORIDE 0.9 % IV BOLUS (SEPSIS)
500.0000 mL | Freq: Once | INTRAVENOUS | Status: AC
  Administered 2012-06-20: 500 mL via INTRAVENOUS

## 2012-06-20 MED ORDER — CEFDINIR 300 MG PO CAPS: 300.0000 mg | ORAL_CAPSULE | Freq: Two times a day (BID) | ORAL | Status: DC

## 2012-06-20 MED ORDER — SODIUM CHLORIDE 0.9 % IV BOLUS (SEPSIS)
1000.0000 mL | Freq: Once | INTRAVENOUS | Status: AC
  Administered 2012-06-20: 1000 mL via INTRAVENOUS

## 2012-06-20 MED ORDER — SODIUM CHLORIDE 0.9 % IV BOLUS (SEPSIS): 1000.0000 mL | Freq: Once | INTRAVENOUS | Status: DC

## 2012-06-20 MED ORDER — ACETAMINOPHEN 325 MG PO TABS
650.0000 mg | ORAL_TABLET | Freq: Once | ORAL | Status: AC
  Administered 2012-06-20: 650 mg via ORAL
  Filled 2012-06-20: qty 2

## 2012-06-20 NOTE — ED Provider Notes (Signed)
History     CSN: [id number]  Arrival date & time 06/20/12  0351   First MD Initiated Contact with Patient 06/20/12 [phone]      Chief complaint - dysuria   Patient is a 66 y.o. female presenting with frequency. The history is provided by the patient.  Urinary Frequency This is a new problem. The current episode started more than 2 days ago. The problem occurs daily. The problem has been gradually worsening. Pertinent negatives include no chest pain and no shortness of breath. Exacerbated by: urination. Nothing relieves the symptoms. She has tried rest for the symptoms. The treatment provided no relief.  pt reports urinary frequency and dysuria for about 3 days In the past day she has noticed chills, fever and nausea/vomiting Denies cough/sob, no CP is reported She reports suprapubic pain  Past Medical History  Diagnosis Date  . Stricture and stenosis of esophagus   . Other and unspecified hyperlipidemia   . Dysthymic disorder   . Other and unspecified coagulation defects   . Malignant neoplasm of breast (female), unspecified site   . Diaphragmatic hernia without mention of obstruction or gangrene   . PAD (peripheral artery disease)   . Colitis, ulcerative     Left sided  . PVD (peripheral vascular disease)     Past Surgical History  Procedure Date  . Cholecystectomy   . Appendectomy   . Tubal ligation   . Breast lumpectomy     Right    Family History  Problem Relation Age of Onset  . Heart disease Father   . Colon cancer Neg Hx   . Lung cancer Maternal Grandfather     History  Substance Use Topics  . Smoking status: Former Smoker    Quit date: 12/08/2002  . Smokeless tobacco: Never Used  . Alcohol Use: 7.0 oz/week    14 drink(s) per week     2 drinks per night    OB History    Grav Para Term Preterm Abortions TAB SAB Ect Mult Living                  Review of Systems  Respiratory: Negative for shortness of breath.   Cardiovascular: Negative for chest  pain.  Genitourinary: Positive for frequency.  All other systems reviewed and are negative.    Allergies  Ciprofloxacin and Sulfonamide derivatives  Home Medications   Current Outpatient Rx  Name Route Sig Dispense Refill  . ALPRAZOLAM 0.25 MG PO TABS Oral Take 0.25 mg by mouth at bedtime as needed. For anxiety    . ASPIRIN 81 MG PO TBEC Oral Take 81 mg by mouth daily.      . IBUPROFEN 200 MG PO TABS Oral Take 400 mg by mouth every 6 (six) hours as needed. For headache pain    . OMEPRAZOLE 20 MG PO CPDR Oral Take 20 mg by mouth daily.    . SERTRALINE HCL 100 MG PO TABS Oral Take 50 mg by mouth daily.       BP 151/78  Pulse 146  Temp 101.4 F (38.6 C) (Oral)  Resp 22  SpO2 90% BP 104/61  Pulse 88  Temp 99.7 F (37.6 C) (Oral)  Resp 18  SpO2 95%   Physical Exam CONSTITUTIONAL: Well developed/well nourished HEAD AND FACE: Normocephalic/atraumatic EYES: EOMI/PERRL ENMT: Mucous membranes moist NECK: supple no meningeal signs SPINE:entire spine nontender CV: S1/S2 noted, no murmurs/rubs/gallops noted LUNGS: Lungs are clear to auscultation bilaterally, no apparent distress ABDOMEN: soft, nontender,  no rebound or guarding GU:no focal cva tenderness NEURO: Pt is awake/alert, moves all extremitiesx4 EXTREMITIES: pulses normal, full ROM SKIN: warm, color normal PSYCH: no abnormalities of mood noted  ED Course  Procedures    Labs Reviewed  CBC WITH DIFFERENTIAL  BASIC METABOLIC PANEL  URINALYSIS, ROUTINE W REFLEX MICROSCOPIC  URINE CULTURE  LACTIC ACID, PLASMA  CULTURE, BLOOD (ROUTINE X 2)  CULTURE, BLOOD (ROUTINE X 2)   4:44 AM Pt febrile, tachycardic, reports dysuria, suspect pyelo Fluids ordered and will follow closely 7:15 AM Pt feels improved, her HR is improved IV fluids are continuing and will recheck lactate  D/w dr Weldon Inches to f/u on repeat lactate.  If vitals improved, lactate improved and she is ambulatory will be stable for d/c with outpatient  antibiotics (allergy to cipro, will need keflex)  MDM  Nursing notes including past medical history and social history reviewed and considered in documentation All labs/vitals reviewed and considered      Date: 06/20/2012  Rate: 83  Rhythm: normal sinus rhythm  QRS Axis: normal  Intervals: normal  ST/T Wave abnormalities: nonspecific ST changes  Conduction Disutrbances:none       Joya Gaskins, MD 06/20/12 [phone]

## 2012-06-20 NOTE — ED Notes (Addendum)
Chart reviewed by EDP and she wants patient to return to ER for repeat cultures per Grant Fontana PAC. Patient informed of same and voiced understanding .

## 2012-06-20 NOTE — ED Provider Notes (Addendum)
Assumed care from Dr. Bebe Shaggy.  Pt has pyelo.  Treated in ed with apap and rocephin.  Need to reeval after ivf and dispo according to pt condition/sxs.  Cheri Guppy, MD 06/20/12 [phone]  Reviewed h&p and reevaluated pt. Pt is asx now. Feels better.  Says she feels as if she can go home.    Cheri Guppy, MD 06/20/12 1538

## 2012-06-20 NOTE — ED Notes (Addendum)
Blood Culture report-Gram negative rods report called from [facility] .Chart sent to EDP office for review.

## 2012-06-20 NOTE — ED Notes (Signed)
Per pt report: Pt has had bladder frequency for about 3 days and this am woke up with chills and a fever.  Pt is currently feeling nauseas for the past two days and has a headache. Pt endorses vomiting clear mucus.  Pt has not really been eating anything for the past two days.

## 2012-06-21 ENCOUNTER — Encounter (HOSPITAL_COMMUNITY): Payer: Self-pay | Admitting: *Deleted

## 2012-06-21 ENCOUNTER — Inpatient Hospital Stay (HOSPITAL_COMMUNITY): Payer: Medicare Other

## 2012-06-21 DIAGNOSIS — B9689 Other specified bacterial agents as the cause of diseases classified elsewhere: Secondary | ICD-10-CM

## 2012-06-21 DIAGNOSIS — C50919 Malignant neoplasm of unspecified site of unspecified female breast: Secondary | ICD-10-CM

## 2012-06-21 DIAGNOSIS — R7881 Bacteremia: Secondary | ICD-10-CM

## 2012-06-21 DIAGNOSIS — N12 Tubulo-interstitial nephritis, not specified as acute or chronic: Secondary | ICD-10-CM

## 2012-06-21 DIAGNOSIS — I959 Hypotension, unspecified: Secondary | ICD-10-CM | POA: Diagnosis present

## 2012-06-21 DIAGNOSIS — R1013 Epigastric pain: Secondary | ICD-10-CM

## 2012-06-21 LAB — URINALYSIS, ROUTINE W REFLEX MICROSCOPIC
Bilirubin Urine: NEGATIVE
Hgb urine dipstick: NEGATIVE
Ketones, ur: NEGATIVE mg/dL
Nitrite: NEGATIVE
Protein, ur: 30 mg/dL — AB
Urobilinogen, UA: 0.2 mg/dL (ref 0.0–1.0)

## 2012-06-21 LAB — BASIC METABOLIC PANEL
BUN: 8 mg/dL (ref 6–23)
Calcium: 9 mg/dL (ref 8.4–10.5)
Creatinine, Ser: 0.65 mg/dL (ref 0.50–1.10)
GFR calc Af Amer: >90 mL/min (ref >90)
GFR calc non Af Amer: >90 mL/min (ref >90)
Glucose, Bld: 93 mg/dL (ref 70–99)
Potassium: 3.3 mEq/L — ABNORMAL LOW (ref 3.5–5.1)

## 2012-06-21 LAB — CBC WITH DIFFERENTIAL/PLATELET
Basophils Relative: 0 % (ref 0–1)
Eosinophils Absolute: 0.1 10*3/uL (ref 0.0–0.7)
Eosinophils Relative: 1 % (ref 0–5)
Hemoglobin: 12.9 g/dL (ref 12.0–15.0)
Lymphs Abs: 1.9 10*3/uL (ref 0.7–4.0)
MCH: 32.5 pg (ref 26.0–34.0)
MCHC: 34 g/dL (ref 30.0–36.0)
MCV: 95.5 fL (ref 78.0–100.0)
Monocytes Absolute: 1.9 10*3/uL — ABNORMAL HIGH (ref 0.1–1.0)
Monocytes Relative: 14 % — ABNORMAL HIGH (ref 3–12)
Neutrophils Relative %: 72 % (ref 43–77)

## 2012-06-21 LAB — URINE MICROSCOPIC-ADD ON

## 2012-06-21 MED ORDER — PIPERACILLIN SOD-TAZOBACTAM SO 2.25 (2-0.25) G IV SOLR
3.3750 g | Freq: Once | INTRAVENOUS | Status: AC
  Administered 2012-06-21: 3.375 g via INTRAVENOUS
  Filled 2012-06-21: qty 3.38

## 2012-06-21 MED ORDER — PANTOPRAZOLE SODIUM 40 MG PO TBEC
40.0000 mg | DELAYED_RELEASE_TABLET | Freq: Every day | ORAL | Status: DC
  Administered 2012-06-21 – 2012-06-23 (×3): 40 mg via ORAL
  Filled 2012-06-21 (×3): qty 1

## 2012-06-21 MED ORDER — OXYCODONE-ACETAMINOPHEN 5-325 MG PO TABS
1.0000 | ORAL_TABLET | ORAL | Status: DC | PRN
  Administered 2012-06-21: 2 via ORAL
  Administered 2012-06-22: 1 via ORAL
  Administered 2012-06-22: 2 via ORAL
  Filled 2012-06-21: qty 1
  Filled 2012-06-21 (×2): qty 2

## 2012-06-21 MED ORDER — SODIUM CHLORIDE 0.9 % IV BOLUS (SEPSIS)
1000.0000 mL | Freq: Once | INTRAVENOUS | Status: AC
  Administered 2012-06-21: 1000 mL via INTRAVENOUS

## 2012-06-21 MED ORDER — ONDANSETRON HCL 4 MG/2ML IJ SOLN
4.0000 mg | Freq: Four times a day (QID) | INTRAMUSCULAR | Status: DC | PRN
  Administered 2012-06-21: 4 mg via INTRAVENOUS
  Filled 2012-06-21: qty 2

## 2012-06-21 MED ORDER — SERTRALINE HCL 50 MG PO TABS
50.0000 mg | ORAL_TABLET | Freq: Every day | ORAL | Status: DC
  Administered 2012-06-21 – 2012-06-23 (×3): 50 mg via ORAL
  Filled 2012-06-21 (×3): qty 1

## 2012-06-21 MED ORDER — SODIUM CHLORIDE 0.9 % IV SOLN
Freq: Once | INTRAVENOUS | Status: AC
  Administered 2012-06-21: 05:00:00 via INTRAVENOUS

## 2012-06-21 MED ORDER — SODIUM CHLORIDE 0.9 % IV SOLN: 500.0000 mL | INTRAVENOUS | Status: DC

## 2012-06-21 MED ORDER — PIPERACILLIN-TAZOBACTAM 3.375 G IVPB 30 MIN
3.3750 g | Freq: Three times a day (TID) | INTRAVENOUS | Status: DC
  Administered 2012-06-21 – 2012-06-23 (×7): 3.375 g via INTRAVENOUS
  Filled 2012-06-21 (×9): qty 50

## 2012-06-21 MED ORDER — MORPHINE SULFATE 2 MG/ML IJ SOLN
1.0000 mg | INTRAMUSCULAR | Status: DC | PRN
  Filled 2012-06-21: qty 1

## 2012-06-21 MED ORDER — ONDANSETRON HCL 4 MG PO TABS
4.0000 mg | ORAL_TABLET | Freq: Four times a day (QID) | ORAL | Status: DC | PRN
  Administered 2012-06-21: 4 mg via ORAL
  Filled 2012-06-21: qty 1

## 2012-06-21 MED ORDER — ONDANSETRON HCL 4 MG/2ML IJ SOLN
4.0000 mg | Freq: Once | INTRAMUSCULAR | Status: AC
  Administered 2012-06-21: 4 mg via INTRAVENOUS
  Filled 2012-06-21: qty 2

## 2012-06-21 MED ORDER — ALPRAZOLAM 0.25 MG PO TABS
0.2500 mg | ORAL_TABLET | Freq: Every evening | ORAL | Status: DC | PRN
  Administered 2012-06-22: 0.25 mg via ORAL
  Filled 2012-06-21 (×2): qty 1

## 2012-06-21 MED ORDER — SODIUM CHLORIDE 0.9 % IV SOLN
INTRAVENOUS | Status: DC
  Administered 2012-06-21 – 2012-06-22 (×3): via INTRAVENOUS

## 2012-06-21 MED ORDER — CHOLESTYRAMINE 4 G PO PACK
4.0000 g | PACK | Freq: Three times a day (TID) | ORAL | Status: DC
  Administered 2012-06-21 – 2012-06-23 (×6): 4 g via ORAL
  Filled 2012-06-21 (×9): qty 1

## 2012-06-21 MED ORDER — DOCUSATE SODIUM 100 MG PO CAPS
100.0000 mg | ORAL_CAPSULE | Freq: Two times a day (BID) | ORAL | Status: DC
  Administered 2012-06-21 – 2012-06-23 (×2): 100 mg via ORAL
  Filled 2012-06-21 (×6): qty 1

## 2012-06-21 MED ORDER — ACETAMINOPHEN 325 MG PO TABS
650.0000 mg | ORAL_TABLET | Freq: Four times a day (QID) | ORAL | Status: DC | PRN
  Administered 2012-06-21 – 2012-06-23 (×3): 650 mg via ORAL
  Filled 2012-06-21 (×3): qty 2

## 2012-06-21 MED ORDER — POTASSIUM CHLORIDE CRYS ER 20 MEQ PO TBCR
40.0000 meq | EXTENDED_RELEASE_TABLET | Freq: Every day | ORAL | Status: DC
  Administered 2012-06-21 – 2012-06-23 (×3): 40 meq via ORAL
  Filled 2012-06-21 (×3): qty 2

## 2012-06-21 MED ORDER — ENOXAPARIN SODIUM 40 MG/0.4ML ~~LOC~~ SOLN
40.0000 mg | SUBCUTANEOUS | Status: DC
  Administered 2012-06-21 – 2012-06-23 (×3): 40 mg via SUBCUTANEOUS
  Filled 2012-06-21 (×3): qty 0.4

## 2012-06-21 MED ORDER — ASPIRIN EC 81 MG PO TBEC
81.0000 mg | DELAYED_RELEASE_TABLET | Freq: Every day | ORAL | Status: DC
  Administered 2012-06-21 – 2012-06-23 (×3): 81 mg via ORAL
  Filled 2012-06-21 (×3): qty 1

## 2012-06-21 NOTE — ED Notes (Signed)
Pt reports being called from this hospital and asked to return due to abnormal lab results. Pt reports recent hx of urinary s/s and fever, seen yesterday for same.

## 2012-06-21 NOTE — ED Provider Notes (Signed)
History     CSN: [id number]  Arrival date & time 06/20/12  2347   First MD Initiated Contact with Patient 06/21/12 0022      Chief Complaint  Patient presents with  . Fever    (Consider location/radiation/quality/duration/timing/severity/associated sxs/prior treatment) HPI Comments: Pt presents with family after having positive blood cultures in last 12 hours.  GNR seen on micro.  She was (placed on omnicef and has had 2 doses at home after getting IV rocephin in ED and improving.  She admits to ongoing fevers, nausea and poor PO intake.  Sx are persistent, nothing makes worse, temporary improvement with phenergan.  Denies rashes, cough, sob, neck pain or headache.  She does admit to chronic diarrhea.  Sx are moderate to severe.  Patient is a 66 y.o. female presenting with fever. The history is provided by the patient, the spouse and medical records.  Fever Primary symptoms of the febrile illness include fever.    Past Medical History  Diagnosis Date  . Stricture and stenosis of esophagus   . Other and unspecified hyperlipidemia   . Dysthymic disorder   . Other and unspecified coagulation defects   . Malignant neoplasm of breast (female), unspecified site   . Diaphragmatic hernia without mention of obstruction or gangrene   . PAD (peripheral artery disease)   . Colitis, ulcerative     Left sided  . PVD (peripheral vascular disease)     Past Surgical History  Procedure Date  . Cholecystectomy   . Appendectomy   . Tubal ligation   . Breast lumpectomy     Right    Family History  Problem Relation Age of Onset  . Heart disease Father   . Colon cancer Neg Hx   . Lung cancer Maternal Grandfather     History  Substance Use Topics  . Smoking status: Former Smoker    Quit date: 12/08/2002  . Smokeless tobacco: Never Used  . Alcohol Use: 7.0 oz/week    14 drink(s) per week     2 drinks per night    OB History    Grav Para Term Preterm Abortions TAB SAB Ect Mult  Living                  Review of Systems  Constitutional: Positive for fever.  All other systems reviewed and are negative.    Allergies  Ciprofloxacin and Sulfonamide derivatives  Home Medications   Current Outpatient Rx  Name Route Sig Dispense Refill  . ALPRAZOLAM 0.25 MG PO TABS Oral Take 0.25 mg by mouth at bedtime as needed. For anxiety    . ASPIRIN 81 MG PO TBEC Oral Take 81 mg by mouth daily.      [name] CEFDINIR 300 MG PO CAPS Oral Take 300 mg by mouth 2 (two) times daily.    . IBUPROFEN 200 MG PO TABS Oral Take 400 mg by mouth every 6 (six) hours as needed. For headache pain    . OMEPRAZOLE 20 MG PO CPDR Oral Take 20 mg by mouth daily.    . SERTRALINE HCL 100 MG PO TABS Oral Take 50 mg by mouth daily.       BP 108/59  Pulse 92  Temp 98.1 F (36.7 C) (Oral)  Resp 20  SpO2 98%  Physical Exam  Nursing note and vitals reviewed. Constitutional: She appears well-developed and well-nourished. No distress.  HENT:  Head: Normocephalic and atraumatic.  Mouth/Throat: No oropharyngeal exudate.  MM dry  Eyes: Conjunctivae and EOM are normal. Pupils are equal, round, and reactive to light. Right eye exhibits no discharge. Left eye exhibits no discharge. No scleral icterus.  Neck: Normal range of motion. Neck supple. No JVD present. No thyromegaly present.  Cardiovascular: Normal rate, regular rhythm, normal heart sounds and intact distal pulses.  Exam reveals no gallop and no friction rub.   No murmur heard. Pulmonary/Chest: Effort normal and breath sounds normal. No respiratory distress. She has no wheezes. She has no rales.  Abdominal: Soft. Bowel sounds are normal. She exhibits no distension and no mass. There is tenderness ( mild SP ttp, + CVA ttp on the L).  Musculoskeletal: Normal range of motion. She exhibits no edema and no tenderness.  Lymphadenopathy:    She has no cervical adenopathy.  Neurological: She is alert. Coordination normal.  Skin: Skin is warm and  dry. No rash noted. No erythema.  Psychiatric: She has a normal mood and affect. Her behavior is normal.    ED Course  Procedures (including critical care time)  Labs Reviewed  CBC WITH DIFFERENTIAL - Abnormal; Notable for the following:    WBC 13.8 (*)     Neutro Abs 9.9 (*)     Monocytes Relative 14 (*)     Monocytes Absolute 1.9 (*)     All other components within normal limits  BASIC METABOLIC PANEL - Abnormal; Notable for the following:    Potassium 3.3 (*)     All other components within normal limits  URINALYSIS, ROUTINE W REFLEX MICROSCOPIC - Abnormal; Notable for the following:    Color, Urine AMBER (*)  BIOCHEMICALS MAY BE AFFECTED BY COLOR   APPearance CLOUDY (*)     Protein, ur 30 (*)     Leukocytes, UA MODERATE (*)     All other components within normal limits  URINE MICROSCOPIC-ADD ON - Abnormal; Notable for the following:    Squamous Epithelial / LPF FEW (*)     All other components within normal limits  LACTIC ACID, PLASMA   No results found.   1. Pyelonephritis       MDM  Still feeling ill, d/w pharmacy - will broaden to zosyn for coverage.  Patient will likely need to be admitted to the hospital as she is having ongoing significant symptoms with positive blood culture reports. We do not have sensitivities back at this time.   White blood cell count still elevated, urinalysis with too numerous to count white blood cells, patient admitted to the medical surgical floor under the care of Dr. Conley Rolls, the hospitalist.     Vida Roller, MD 06/21/12 [phone]

## 2012-06-21 NOTE — Progress Notes (Signed)
Patient ID: Theresa [name], female   DOB: February 24, 1946, 66 y.o.   MRN: [id number] Brief addendum to H&P today: Patient was seen and examined at bedside; i have reviewed all the labs and imaging studies with patient and her family. We will repeat blood cultures today to make sure bacteremia cleared. Morphine PRN and percocet PRN added for pain control Will continue zosyn for now.  Manson [facility]sey [facility] [phone] or [phone]

## 2012-06-21 NOTE — H&P (Signed)
[facility] History and Physical  Theresa [name] ION:[id number] DOB: 1946/11/14    PCP:   Theresa Floro, MD   Chief Complaint: Pyelonephritis with bacteremia.   HPI: Theresa [name] is an 66 y.o. female with history of dysthymic disorder, left breast neoplasm, esosphageal stricture,PAD, ulcerative colitis, who was seen earlier in the ER, diagnosed with a UTI, and was given Rocephin iV, with PO Omnicef.  She later was called back because of GNR blood cultures.  She continues to have nausea, chills, and general malaise.  Repeat CBC showed persistent mild leukocytosis with a WBC of 14K, normal Hb, and normal renal function tests.  She had improved her K level to now of 3.3 mEq/ L.  Hospitalist was asked to admit her for further treatment now that she has persistent symptoms and bacteremia.  It should be noted that she said her BP has always been low, about 100 systolic.  Rewiew of Systems:  Constitutional: Negative for malaise. No significant weight loss or weight gain Eyes: Negative for eye pain, redness and discharge, diplopia, visual changes, or flashes of light. ENMT: Negative for ear pain, hoarseness, nasal congestion, sinus pressure and sore throat. No headaches; tinnitus, drooling, or problem swallowing. Cardiovascular: Negative for chest pain, palpitations, diaphoresis, dyspnea and peripheral edema. ; No orthopnea, PND Respiratory: Negative for cough, hemoptysis, wheezing and stridor. No pleuritic chestpain. Gastrointestinal: Negative for nausea, vomiting, diarrhea, constipation, abdominal pain, melena, blood in stool, hematemesis, jaundice and rectal bleeding.    Genitourinary: Negative for incontinence,flank pain and hematuria; Musculoskeletal: Negative neck pain. Negative for swelling and trauma.;  Skin: . Negative for pruritus, rash, abrasions, bruising and skin lesion.; ulcerations Neuro: Negative for headache, lightheadedness and neck stiffness. Negative for  weakness, altered level of consciousness , altered mental status, extremity weakness, burning feet, involuntary movement, seizure and syncope.  Psych: negative for anxiety, depression, insomnia, tearfulness, panic attacks, hallucinations, paranoia, suicidal or homicidal ideation    Past Medical History  Diagnosis Date  . Stricture and stenosis of esophagus   . Other and unspecified hyperlipidemia   . Dysthymic disorder   . Other and unspecified coagulation defects   . Malignant neoplasm of breast (female), unspecified site   . Diaphragmatic hernia without mention of obstruction or gangrene   . PAD (peripheral artery disease)   . Colitis, ulcerative     Left sided  . PVD (peripheral vascular disease)     Past Surgical History  Procedure Date  . Cholecystectomy   . Appendectomy   . Tubal ligation   . Breast lumpectomy     Right    Medications:  HOME MEDS: Prior to Admission medications   Medication Sig Start Date End Date Taking? Authorizing Provider  ALPRAZolam (XANAX) 0.25 MG tablet Take 0.25 mg by mouth at bedtime as needed. For anxiety   Yes Historical Provider, MD  aspirin 81 MG EC tablet Take 81 mg by mouth daily.     Yes Historical Provider, MD  cefdinir (OMNICEF) 300 MG capsule Take 300 mg by mouth 2 (two) times daily. 06/20/12 06/30/12 Yes Cheri Guppy, MD  ibuprofen (ADVIL) 200 MG tablet Take 400 mg by mouth every 6 (six) hours as needed. For headache pain   Yes Historical Provider, MD  omeprazole (PRILOSEC) 20 MG capsule Take 20 mg by mouth daily.   Yes Historical Provider, MD  sertraline (ZOLOFT) 100 MG tablet Take 50 mg by mouth daily.    Yes Historical Provider, MD     Allergies:  Allergies  Allergen Reactions  . Ciprofloxacin     Hives   . Sulfonamide Derivatives Other (See Comments)    Unknown reaction    Social History:   reports that she quit smoking about 9 years ago. She has never used smokeless tobacco. She reports that she drinks about 7  ounces of alcohol per week. She reports that she does not use illicit drugs.  Family History: Family History  Problem Relation Age of Onset  . Heart disease Father   . Colon cancer Neg Hx   . Lung cancer Maternal Grandfather      Physical Exam: Filed Vitals:   06/20/12 2357 06/21/12 0300 06/21/12 0400 06/21/12 0420  BP: 108/59 118/58 83/66 96/64   Pulse: 92 54 72   Temp: 98.1 F (36.7 C)     TempSrc: Oral     Resp: 20 16 19    SpO2: 98% 97% 96%    Blood pressure 96/64, pulse 72, temperature 98.1 F (36.7 C), temperature source Oral, resp. rate 19, SpO2 96.00%.  GEN:  Pleasant patient lying in the stretcher in no acute distress; cooperative with exam. PSYCH:  alert and oriented x4; does not appear anxious or depressed; affect is appropriate. HEENT: Mucous membranes pink and anicteric; PERRLA; EOM intact; no cervical lymphadenopathy nor thyromegaly or carotid bruit; no JVD; There were no stridor. Neck is very supple. Breasts:: Not examined CHEST WALL: No tenderness CHEST: Normal respiration, clear to auscultation bilaterally.  HEART: Regular rate and rhythm.  There are no murmur, rub, or gallops.   BACK: No kyphosis or scoliosis; no CVA tenderness ABDOMEN: soft and non-tender; no masses, no organomegaly, normal abdominal bowel sounds; no pannus; no intertriginous candida. There is no rebound and no distention. Rectal Exam: Not done EXTREMITIES: No bone or joint deformity; age-appropriate arthropathy of the hands and knees; no edema; no ulcerations.  There is no calf tenderness. Genitalia: not examined PULSES: 2+ and symmetric SKIN: Normal hydration no rash or ulceration CNS: Cranial nerves 2-12 grossly intact no focal lateralizing neurologic deficit.  Speech is fluent; uvula elevated with phonation, facial symmetry and tongue midline. DTR are normal bilaterally, cerebella exam is intact, barbinski is negative and strengths are equaled bilaterally.  No sensory loss.   Labs on  Admission:  Basic Metabolic Panel:  Lab 06/21/12 1610 06/20/12 0455  NA 137 135  K 3.3* 2.8*  CL 107 99  CO2 21 20  GLUCOSE 93 154*  BUN 8 9  CREATININE 0.65 0.65  CALCIUM 9.0 9.1  MG -- --  PHOS -- --   Liver Function Tests: No results found for this basename: AST:5,ALT:5,ALKPHOS:5,BILITOT:5,PROT:5,ALBUMIN:5 in the last 168 hours No results found for this basename: LIPASE:5,AMYLASE:5 in the last 168 hours No results found for this basename: AMMONIA:5 in the last 168 hours CBC:  Lab 06/21/12 0115 06/20/12 0455  WBC 13.8* 14.6*  NEUTROABS 9.9* 12.1*  HGB 12.9 13.9  HCT 37.9 39.9  MCV 95.5 94.5  PLT 202 197   Cardiac Enzymes: No results found for this basename: CKTOTAL:5,CKMB:5,CKMBINDEX:5,TROPONINI:5 in the last 168 hours  CBG: No results found for this basename: GLUCAP:5 in the last 168 hours   Radiological Exams on Admission: No results found.     Assessment/Plan Present on Admission:  .Pyelonephritis .Bacteremia due to Gram-negative bacteria .Hypotension .HYPERLIPIDEMIA .NAUSEA AND VOMITING .Diarrhea   PLAN:  For her pyelonephritis, will change her antibiotic to IV Zosyn.  Will await ID and sensitivities.  She has low blood pressure, but it is not new for  her, and she has been relatively asymptomatic.  Her diarrhea has been chronic since her cholecystectomy, and I will try Cholestyramine to sequestrate bile.  She will be given IVF along with continual K supplements.  Will obtain a renal US to be prudent of any potential hydronephrosis.  She is stable, full code, and will be admitted to [facility] service.  Thank you for allowing me to participate in her care.   Other plans as per orders.  Code Status: Golda Acre, MD. [facility] Pager [phone] 7pm to 7am.  06/21/2012, 4:51 AM

## 2012-06-22 ENCOUNTER — Other Ambulatory Visit: Payer: Self-pay | Admitting: *Deleted

## 2012-06-22 ENCOUNTER — Inpatient Hospital Stay (HOSPITAL_COMMUNITY): Payer: Medicare Other

## 2012-06-22 DIAGNOSIS — C50919 Malignant neoplasm of unspecified site of unspecified female breast: Secondary | ICD-10-CM

## 2012-06-22 DIAGNOSIS — K219 Gastro-esophageal reflux disease without esophagitis: Secondary | ICD-10-CM

## 2012-06-22 DIAGNOSIS — D72829 Elevated white blood cell count, unspecified: Secondary | ICD-10-CM | POA: Diagnosis present

## 2012-06-22 DIAGNOSIS — I739 Peripheral vascular disease, unspecified: Secondary | ICD-10-CM

## 2012-06-22 LAB — BASIC METABOLIC PANEL
Calcium: 9.2 mg/dL (ref 8.4–10.5)
Creatinine, Ser: 0.7 mg/dL (ref 0.50–1.10)
GFR calc Af Amer: >90 mL/min (ref >90)
GFR calc non Af Amer: 88 mL/min — ABNORMAL LOW (ref >90)
Sodium: 138 mEq/L (ref 135–145)

## 2012-06-22 LAB — URINE CULTURE

## 2012-06-22 LAB — CBC
Platelets: 185 10*3/uL (ref 150–400)
RBC: 3.88 MIL/uL (ref 3.87–5.11)
RDW: 13.5 % (ref 11.5–15.5)
WBC: 7.7 10*3/uL (ref 4.0–10.5)

## 2012-06-22 MED ORDER — SODIUM CHLORIDE 0.9 % IJ SOLN: 10.0000 mL | Freq: Two times a day (BID) | INTRAMUSCULAR | Status: DC

## 2012-06-22 MED ORDER — SODIUM CHLORIDE 0.9 % IJ SOLN: 10.0000 mL | INTRAMUSCULAR | Status: DC | PRN

## 2012-06-22 MED ORDER — ALPRAZOLAM 0.25 MG PO TABS
0.2500 mg | ORAL_TABLET | Freq: Once | ORAL | Status: AC
  Administered 2012-06-22: 0.25 mg via ORAL
  Filled 2012-06-22: qty 1

## 2012-06-22 NOTE — Progress Notes (Signed)
Peripherally Inserted Central Catheter/Midline Placement  The IV Nurse has discussed with the patient and/or persons authorized to consent for the patient, the purpose of this procedure and the potential benefits and risks involved with this procedure.  The benefits include less needle sticks, lab draws from the catheter and patient may be discharged home with the catheter.  Risks include, but not limited to, infection, bleeding, blood clot (thrombus formation), and puncture of an artery; nerve damage and irregular heat beat.  Alternatives to this procedure were also discussed.  PICC/Midline Placement Documentation        Stacie Glaze Horton 06/22/2012, 5:30 PM

## 2012-06-22 NOTE — Progress Notes (Signed)
[facility] PROGRESS NOTE  UNICE VANTASSEL UJW:[id number] DOB: 06/14/1946 DOA: 06/20/2012 PCP: Daisy Floro, MD  Brief narrative: 66 year old female with multiple medical history including but not limited to dyslipidemia, anxiety and depression who presented to ED initially 7/14 in am and was found to have UTI, was given IV Rocephin and then discharged home with PO antibiotic. Patient was subsequently called to come back due to blood cultures showing gram negative rods. Blood cultures from 06/20/2012 grew E.Coli but sensitivity results are not yet available. Repeat blood culture from 06/21/2012 show no growth to date. Urine culture from 06/20/2012 grew E.Coli as well and per micro lab [phone]) sensitive to ceftriaxone and Cipro. Spoke with Dr. Ninetta Lights of ID and recommendation was (provided patient sensitive to the following antibiotics) that patient be discharged with 5 days of IV Rocephin followed by 2 week course of PO Levaquin. Please note that the urine culture sensitivity was not tested against Levaquin but it is sensitive to Cipro.  Assessment/Plan:  Principal Problem:  *Abdominal pain secondary to UTI and bacteremia secondary to E.Coli  Blood cultures from 7/14 grew E.Coli but repeat blood cultures form 06/21/2012 show no growth to date  Spoke with ID on call and recommendation was as above with IV Rocephin for 5 days (ebd day 06/26/2012) and then 2 weeks PO levaquin  We will still continue zosyn as sensitivities for blood cultures are not yet available, I spoke with micro lab and was told they should have them available tomorrow  Placed order for PICC line  Active Problems:  HYPERLIPIDEMIA  Continue cholestyramine   ANXIETY DEPRESSION  Continue sertraline   Leukocytosis  Secondary to UTI and bacteremia secondary to E.Coli  Management as above  Code Status: full code Family Communication: patient's husband updated at bedside 06/21/2012 Disposition Plan: likely  home in next 24 hours when we have sensitivities available for blood cultures from 06/20/2012  Consultants:  None; I have notified PCP of patient's admission (Dr. Tenny Craw, Hessie Diener)  Procedures:  None  Antibiotics:  Zosyn 06/20/2012 -->  Manson Passey, MD  [facility] Pager [phone]  If 7PM-7AM, please contact night-coverage [email or web address] Password [email or web address] 06/22/2012, 2:40 PM   LOS: 2 days   HPI/Subjective: No acute events overnight.  Objective: Filed Vitals:   06/21/12 1455 06/21/12 2207 06/22/12 0546 06/22/12 1423  BP: 133/79 123/79 116/65 103/69  Pulse: 75 67 68 69  Temp: 98.7 F (37.1 C) 98.7 F (37.1 C) 97.8 F (36.6 C) 98.1 F (36.7 C)  TempSrc: Oral Oral Oral Oral  Resp: 20 18 14 16   Height:      Weight:   148 lb 13 oz (67.5 kg)   SpO2: 99% 96% 96% 100%    Intake/Output Summary (Last 24 hours) at 06/22/12 1440 Last data filed at 06/22/12 1300  Gross per 24 hour  Intake   3078 ml  Output      0 ml  Net   3078 ml    Exam:   General:  Pt is alert, follows commands appropriately, not in acute distress  Cardiovascular: Regular rate and rhythm, S1/S2, no murmurs, no rubs, no gallops  Respiratory: Clear to auscultation bilaterally, no wheezing, no crackles, no rhonchi  Abdomen: Soft, non tender, non distended, bowel sounds present, no guarding  Extremities: No edema, pulses DP and PT palpable bilaterally  Neuro: Grossly nonfocal  Data Reviewed: Basic Metabolic Panel:  Lab 06/22/12 6295 06/21/12 0115 06/20/12 0455  NA 138 137 135  K 3.6 3.3*  2.8*  CL 110 107 99  CO2 19 21 20   GLUCOSE 98 93 154*  BUN 7 8 9   CREATININE 0.70 0.65 0.65  CALCIUM 9.2 9.0 9.1   CBC:  Lab 06/22/12 0345 06/21/12 0115 06/20/12 0455  WBC 7.7 13.8* 14.6*  NEUTROABS -- 9.9* 12.1*  HGB 12.5 12.9 13.9  HCT 37.3 37.9 39.9  MCV 96.1 95.5 94.5  PLT 185 202 197    Recent Results (from the past 240 hour(s))  CULTURE, BLOOD (ROUTINE X 2)     Status: Normal  (Preliminary result)   Collection Time   06/20/12  4:55 AM      Component Value Range Status Comment   Culture     Final    Value: ESCHERICHIA COLI     Note: Gram Stain Report Called to,Read Back By and Verified With: Gayleen Orem RN on 06/20/12 at 22:40 by Christie Nottingham   Report Status PENDING   Incomplete   CULTURE, BLOOD (ROUTINE X 2)     Status: Normal (Preliminary result)   Collection Time   06/20/12  5:04 AM      Component Value Range Status Comment   Culture     Final    Value:        BLOOD CULTURE RECEIVED NO GROWTH TO DATE    Report Status PENDING   Incomplete   URINE CULTURE     Status: Normal   Collection Time   06/20/12  5:21 AM      Component Value Range Status Comment   Specimen Description URINE,  Final    Colony Count >=100,000 COLONIES/ML  Final    Culture ESCHERICHIA COLI   Final    Report Status 06/22/2012 FINAL   Final    Organism ID, Bacteria ESCHERICHIA COLI   Final   CULTURE, BLOOD (ROUTINE X 2)     Status: Normal (Preliminary result)   Collection Time   06/21/12  2:20 PM      Component Value Range Status Comment   Culture     Final    Value:        BLOOD CULTURE RECEIVED NO GROWTH TO DATE    Report Status PENDING   Incomplete   CULTURE, BLOOD (ROUTINE X 2)     Status: Normal (Preliminary result)   Collection Time   06/21/12  2:30 PM      Component Value Range Status Comment   Culture     Final    Value:        BLOOD CULTURE RECEIVED NO GROWTH TO DATE    Report Status PENDING   Incomplete      Studies: US Renal 06/21/2012  *RADIOLOGY REPORT*  Clinical Data: Pyelonephritis with bacteremia.  RENAL/URINARY TRACT ULTRASOUND COMPLETE  Comparison:  CT abdomen pelvis 07/02/2009.  Findings:  Right Kidney:  11.8 cm.  Normal in parenchymal echogenicity.  No hydronephrosis.  Left Kidney:  11.7 cm.  Normal in parenchymal echogenicity.  No hydronephrosis.  Bladder:  Normal.  IMPRESSION: Negative.     Scheduled Meds:   . aspirin EC  81 mg Oral Daily  . cholestyramine  4 g  Oral TID  . docusate sodium  100 mg Oral BID  . enoxaparin  injection  40 mg Subcutaneous Q24H  . pantoprazole  40 mg Oral Q1200  . piperacillin-tazobactam  3.375 g Intravenous Q8H  . potassium chloride  40 mEq Oral Daily  . sertraline  50 mg Oral Daily   Continuous Infusions:   .  sodium chloride    . sodium chloride 100 mL/hr at 06/21/12 2139

## 2012-06-22 NOTE — Progress Notes (Signed)
INITIAL ADULT NUTRITION ASSESSMENT Date: 06/22/2012   Time: 5:12 PM Reason for Assessment: Nutrition risk   INTERVENTION: Encouraged increased meal intake. Recommend adding Florastor. Will monitor.   ASSESSMENT: Female 66 y.o.  Dx: Pyelonephritis   Food/Nutrition Related Hx: Pt reports ever since having her gallbladder out 35 years ago she has had 20 episodes of diarrhea daily. She says that whenever she has to go out to see friends she takes 4 Imodium and she does not have the diarrhea. Pt reports having 10 episodes so far today but thinks they are worse r/t being on antibiotics. Pt reports declining appetite for the past year. Pt reports since she has a hiatal hernia she cannot eat large meals but rather eats 4-5 small meals/day with snacks. Pt reports on/off nausea with gagging and reflux symptoms. Pt states she has a small esophagus and has had it stretched 8 times. Pt reports she typically chops her food to help with swallowing. Pt states her dysphagia is worse with taking pills. Pt reports 10 pound unintended weight loss since January of this year r/t lack of appetite. Pt reports eating 100% of muffin this morning and 1/2 of her lunch tray today.   Hx: Past Medical History  Diagnosis Date  . Stricture and stenosis of esophagus   . Other and unspecified hyperlipidemia   . Dysthymic disorder   . Other and unspecified coagulation defects   . Malignant neoplasm of breast (female), unspecified site   . Diaphragmatic hernia without mention of obstruction or gangrene   . PAD (peripheral artery disease)   . Colitis, ulcerative     Left sided  . PVD (peripheral vascular disease)    Related Meds:  Scheduled Meds:   . aspirin EC  81 mg Oral Daily  . cholestyramine  4 g Oral TID  . docusate sodium  100 mg Oral BID  . enoxaparin (LOVENOX) injection  40 mg Subcutaneous Q24H  . pantoprazole  40 mg Oral Q1200  . piperacillin-tazobactam  3.375 g Intravenous Q8H  . potassium chloride  40 mEq  Oral Daily  . sertraline  50 mg Oral Daily   Continuous Infusions:   . sodium chloride    . sodium chloride 100 mL/hr at 06/22/12 1539   PRN Meds:.acetaminophen, ALPRAZolam, morphine injection, ondansetron (ZOFRAN) IV, ondansetron, oxyCODONE-acetaminophen  Ht: 5\' 3"  (160 cm)  Wt: 148 lb 13 oz (67.5 kg)  Ideal Wt: 115 lb % Ideal Wt: 128  Usual Wt: 158 lb % Usual Wt: 94  Body mass index is 26.36 kg/(m^2).  Labs: CMP     Component Value Date/Time   NA 138 06/22/2012 0345   K 3.6 06/22/2012 0345   CL 110 06/22/2012 0345   CO2 19 06/22/2012 0345   GLUCOSE 98 06/22/2012 0345   BUN 7 06/22/2012 0345   CREATININE 0.70 06/22/2012 0345   CALCIUM 9.2 06/22/2012 0345   PROT 6.8 02/10/2012 1409   ALBUMIN 4.0 02/10/2012 1409   AST 33 02/10/2012 1409   ALT 15 02/10/2012 1409   ALKPHOS 76 02/10/2012 1409   BILITOT 0.7 02/10/2012 1409   GFRNONAA 88* 06/22/2012 0345   GFRAA >90 06/22/2012 0345    Intake/Output Summary (Last 24 hours) at 06/22/12 1727 Last data filed at 06/22/12 1300  Gross per 24 hour  Intake   2625 ml  Output      0 ml  Net   2625 ml   Last BM - 06/22/12  Diet Order: General   IVF:  sodium chloride   sodium chloride Last Rate: 100 mL/hr at 06/22/12 1539    Estimated Nutritional Needs:   Kcal:1550-1700 Protein:70-80g Fluid:1.5-1.7L  NUTRITION DIAGNOSIS: -Inadequate oral intake (NI-2.1).  Status: Ongoing  RELATED TO: poor appetite   AS EVIDENCE BY: <50% meal intake with weight loss per pt report   MONITORING/EVALUATION(Goals): Pt to consume >75% of meals/supplements.   EDUCATION NEEDS: -Education needs addressed - discussed nutrition therapy for reflux and provided handout of this information. Also discussed the importance of adequate hydration r/t the amount of diarrhea pt reports having.    Dietitian #: [phone]  DOCUMENTATION CODES Per approved criteria  -Not Applicable    Marshall Cork 06/22/2012, 5:12 PM

## 2012-06-23 DIAGNOSIS — A419 Sepsis, unspecified organism: Secondary | ICD-10-CM | POA: Diagnosis present

## 2012-06-23 LAB — CULTURE, BLOOD (ROUTINE X 2)

## 2012-06-23 MED ORDER — OXYCODONE-ACETAMINOPHEN 5-325 MG PO TABS: 1.0000 | ORAL_TABLET | ORAL | Status: AC | PRN

## 2012-06-23 MED ORDER — LEVOFLOXACIN 500 MG PO TABS: 500.0000 mg | ORAL_TABLET | Freq: Every day | ORAL | Status: AC

## 2012-06-23 MED ORDER — DEXTROSE 5 % IV SOLN: 1.0000 g | INTRAVENOUS | Status: DC

## 2012-06-23 NOTE — Discharge Summary (Signed)
Physician Discharge Summary  Theresa [name]:[id number] DOB: 04/22/1946 DOA: 06/20/2012  PCP: Daisy Floro, MD  Admit date: 06/20/2012 Discharge date: 06/23/2012  Recommendations for Outpatient Follow-up:  1. Home health RN to administer daily Rocephin for 5 days. This will be followed by an oral course of Levaquin for 2 weeks. 2. Patient is instructed to followup with her primary care physician in one week. 3. Recommend followup urine cultures after completion of all antibiotics.  Discharge Diagnoses:  Principal Problem:  *Sepsis secondary to pyelonphritis / gram negative rod (E. Coli) bacteremia Active Problems:  HYPERLIPIDEMIA  ANXIETY DEPRESSION  Leukocytosis   Discharge Condition: Improved.  Diet recommendation: Low-sodium, heart healthy.  History of present illness:  66 year old female with multiple medical history including but not limited to dyslipidemia, anxiety and depression who presented to ED initially 7/14 in am and was found to have UTI, was given IV Rocephin and then discharged home with PO antibiotic. Patient was subsequently called to come back due to blood cultures showing gram negative rods. Blood cultures from 06/20/2012 grew E.Coli, Rocephin and Cipro sensitive. Repeat blood culture from 06/21/2012 show no growth to date. Urine culture from 06/20/2012 grew E.Coli as well and per micro lab [phone]) sensitive to ceftriaxone and Cipro.   Hospital Course:  Principal Problem:  *Sepsis secondary to pyelonphritis / gram negative rod (E. Coli) bacteremia  Patient initially treated for UTI and discharged home but asked to return when blood cultures from 06/20/2012 grew Escherichia coli.  Followup cultures done on 06/21/2012 negative to date.  Dr. Elisabeth Pigeon spoke with Dr. Ninetta Lights about this case and he recommended 5 additional days of IV Rocephin followed by a two-week course of oral Levaquin.  Home health nursing has been set up and a PICC line has been placed  for outpatient IV antibiotic administration. Active Problems:  HYPERLIPIDEMIA  Continue cholestyramine.  ANXIETY DEPRESSION  Continue sertraline.  Leukocytosis  Secondary to bacteremia and sepsis.  Resolved.  Procedures:  PICC placement 06/22/12.  Consultations:  None formally.  Dr. Johny Sax provided telephone consultation regarding choice / duration of antibiotics.  Discharge Exam: Filed Vitals:   06/23/12 1435  BP: 103/71  Pulse: 66  Temp: 98.4 F (36.9 C)  Resp: 18   Filed Vitals:   06/22/12 2139 06/23/12 0516 06/23/12 1350 06/23/12 1435  BP: 113/72 111/65 105/68 103/71  Pulse: 69 75 83 66  Temp: 99 F (37.2 C) 98.5 F (36.9 C) 99.3 F (37.4 C) 98.4 F (36.9 C)  TempSrc: Oral Oral Oral Oral  Resp: 16 18 16 18   Height:      Weight:  66.4 kg (146 lb 6.2 oz)    SpO2: 97% 92% 94% 94%   Gen:  NAD Cardiovascular:  RRR, No M/R/G Respiratory: Lungs CTAB Gastrointestinal: Abdomen soft, NT/ND with normal active bowel sounds. Extremities: No C/E/C    Discharge Instructions  Discharge Orders    Future Appointments: Provider: Department: Dept Phone: Center:   09/16/2012 1:30 PM [facility] [phone] [facility]   09/16/2012 2:00 PM Evern Bio, NP [facility] [phone] [facility]   02/08/2013 1:00 PM Windell Hummingbird [facility] [phone] None   02/10/2013 11:00 AM Si Gaul, MD [facility] [phone] None     Future Orders Please Complete By Expires   Diet - low sodium heart healthy      Increase activity slowly      Call MD for:  temperature >100.4      Call MD for:  persistant nausea and vomiting      Call MD for:  severe uncontrolled pain      Home Health      Scheduling Instructions:   To administer daily Rocephin via PICC line.  Flush line after use.  May pull PICC after therapy completed.   Questions: Responses:   To provide the following care/treatments RN   Face-to-face encounter      Comments:   I  Vastie Douty certify that this patient is under my care and that I, or a nurse practitioner or physician's assistant working with me, had a face-to-face encounter that meets the physician face-to-face encounter requirements with this patient on 06/23/2012.  She requires home IV antibiotics for 5 days.   Questions: Responses:   The encounter with the patient was in whole, or in part, for the following medical condition, which is the primary reason for home health care Sepsis, pyelonephritis, bacteremia   I certify that, based on my findings, the following services are medically necessary home health services Nursing   My clinical findings support the need for the above services OTHER SEE COMMENTS   Further, I certify that my clinical findings support that this patient is homebound (i.e. absences from home require considerable and taxing effort and are for medical reasons or religious services or infrequently or of short duration when for other reasons) OTHER SEE COMMENTS   To provide the following care/treatments RN     Medication List  As of 06/23/2012  3:26 PM   STOP taking these medications         cefdinir 300 MG capsule         TAKE these medications         ADVIL 200 MG tablet   Generic drug: ibuprofen   Take 400 mg by mouth every 6 (six) hours as needed. For headache pain      ALPRAZolam 0.25 MG tablet   Commonly known as: XANAX   Take 0.25 mg by mouth at bedtime as needed. For anxiety      aspirin 81 MG EC tablet   Take 81 mg by mouth daily.      dextrose 5 % SOLN 50 mL with cefTRIAXone 1 G SOLR 1 g   Inject 1 g into the vein daily.      levofloxacin 500 MG tablet   Commonly known as: LEVAQUIN   Take 1 tablet (500 mg total) by mouth daily.      omeprazole 20 MG capsule   Commonly known as: PRILOSEC   Take 20 mg by mouth daily.      oxyCODONE-acetaminophen 5-325 MG per tablet   Commonly known as: PERCOCET/ROXICET   Take 1-2 tablets by mouth every 4 (four) hours as  needed.      sertraline 100 MG tablet   Commonly known as: ZOLOFT   Take 50 mg by mouth daily.           Follow-up Information    Follow up with Daisy Floro, MD in 1 week.   Contact information:   [address] [place] [address] [phone]       Please follow up. ([facility] office [phone])           The results of significant diagnostics from this hospitalization (including imaging, microbiology, ancillary and laboratory) are listed below for reference.    Significant Diagnostic Studies:  Dg Knee 1-2 Views Left 07/08/2012 IMPRESSION: No acute abnormality.  Minimal medial joint  space narrowing.  Original Report Authenticated By: Gwynn Burly, M.D.    US Renal 06/21/2012   IMPRESSION: Negative.  Original Report Authenticated By: Reyes Ivan, M.D.    Microbiology: Recent Results (from the past 240 hour(s))  CULTURE, BLOOD (ROUTINE X 2)     Status: Normal   Collection Time   06/20/12  4:55 AM      Component Value Range Status Comment   Specimen Description BLOOD LEFT ARM   Final    Special Requests BOTTLES DRAWN AEROBIC AND ANAEROBIC 5 CC EACH   Final    Culture  Setup Time 06/20/2012 11:12   Final    Culture     Final    Value: ESCHERICHIA COLI     Note: Gram Stain Report Called to,Read Back By and Verified With: Gayleen Orem RN on 06/20/12 at 22:40 by Christie Nottingham   Report Status 06/23/2012 FINAL   Final    Organism ID, Bacteria ESCHERICHIA COLI   Final   CULTURE, BLOOD (ROUTINE X 2)     Status: Normal (Preliminary result)   Collection Time   06/20/12  5:04 AM      Component Value Range Status Comment   Specimen Description BLOOD LEFT HAND   Final    Special Requests BOTTLES DRAWN AEROBIC AND ANAEROBIC 4 CC EACH   Final    Culture  Setup Time 06/20/2012 11:12   Final    Culture     Final    Value:        BLOOD CULTURE RECEIVED NO GROWTH TO DATE CULTURE WILL BE HELD FOR 5 DAYS BEFORE ISSUING A FINAL NEGATIVE REPORT    Report Status PENDING   Incomplete   URINE CULTURE     Status: Normal   Collection Time   06/20/12  5:21 AM      Component Value Range Status Comment   Specimen Description URINE, CLEAN CATCH   Final    Special Requests NONE   Final    Culture  Setup Time 06/20/2012 11:11   Final    Colony Count >=100,000 COLONIES/ML   Final    Culture ESCHERICHIA COLI   Final    Report Status 06/22/2012 FINAL   Final    Organism ID, Bacteria ESCHERICHIA COLI   Final   CULTURE, BLOOD (ROUTINE X 2)     Status: Normal (Preliminary result)   Collection Time   06/21/12  2:20 PM      Component Value Range Status Comment   Specimen Description BLOOD LEFT ARM   Final    Special Requests BOTTLES DRAWN AEROBIC AND ANAEROBIC 5CC   Final    Culture  Setup Time 06/21/2012 23:18   Final    Culture     Final    Value:        BLOOD CULTURE RECEIVED NO GROWTH TO DATE CULTURE WILL BE HELD FOR 5 DAYS BEFORE ISSUING A FINAL NEGATIVE REPORT   Report Status PENDING   Incomplete   CULTURE, BLOOD (ROUTINE X 2)     Status: Normal (Preliminary result)   Collection Time   06/21/12  2:30 PM      Component Value Range Status Comment   Specimen Description BLOOD LEFT ARM   Final    Special Requests BOTTLES DRAWN AEROBIC AND ANAEROBIC 3CC   Final    Culture  Setup Time 06/21/2012 23:18   Final    Culture     Final    Value:  BLOOD CULTURE RECEIVED NO GROWTH TO DATE CULTURE WILL BE HELD FOR 5 DAYS BEFORE ISSUING A FINAL NEGATIVE REPORT   Report Status PENDING   Incomplete      Labs: Basic Metabolic Panel:  Lab 06/22/12 1610 06/21/12 0115 06/20/12 0455  NA 138 137 135  K 3.6 3.3* 2.8*  CL 110 107 99  CO2 19 21 20   GLUCOSE 98 93 154*  BUN 7 8 9   CREATININE 0.70 0.65 0.65  CALCIUM 9.2 9.0 9.1  MG -- -- --  PHOS -- -- --   CBC:  Lab 06/22/12 0345 06/21/12 0115 06/20/12 0455  WBC 7.7 13.8* 14.6*  NEUTROABS -- 9.9* 12.1*  HGB 12.5 12.9 13.9  HCT 37.3 37.9 39.9  MCV 96.1 95.5 94.5  PLT 185 202 197    Time  coordinating discharge: 35 minutes.  Signed:  Silvia Hightower  [facility] 06/23/2012, 3:26 PM

## 2012-06-26 LAB — CULTURE, BLOOD (ROUTINE X 2): Culture: NO GROWTH

## 2012-06-27 LAB — CULTURE, BLOOD (ROUTINE X 2): Culture: NO GROWTH

## 2012-09-14 ENCOUNTER — Encounter (INDEPENDENT_AMBULATORY_CARE_PROVIDER_SITE_OTHER): Payer: Medicare Other | Admitting: *Deleted

## 2012-09-14 ENCOUNTER — Ambulatory Visit (INDEPENDENT_AMBULATORY_CARE_PROVIDER_SITE_OTHER): Payer: Medicare Other | Admitting: Neurosurgery

## 2012-09-14 ENCOUNTER — Encounter: Payer: Self-pay | Admitting: Neurosurgery

## 2012-09-14 VITALS — BP 141/93 | HR 79 | Resp 18 | Ht 63.0 in | Wt 152.0 lb

## 2012-09-14 DIAGNOSIS — I739 Peripheral vascular disease, unspecified: Secondary | ICD-10-CM

## 2012-09-14 DIAGNOSIS — I70219 Atherosclerosis of native arteries of extremities with intermittent claudication, unspecified extremity: Secondary | ICD-10-CM

## 2012-09-14 NOTE — Progress Notes (Signed)
[facility] [place] PAD/PVD Office Note  CC: Six-month PVD surveillance Referring Physician: Edilia Bo  History of Present Illness: 66 year old female patient of Dr. Edilia Bo followed for known peripheral vascular disease and claudication. Patient states she doesn't think her claudication has worsened but it certainly has not gotten any better. The patient denies rest pain or open ulcerations of lower extremities. The patient states she does set a computer in the evenings much more than she should and doesn't walk very far at all.  Past Medical History  Diagnosis Date  . Stricture and stenosis of esophagus   . Other and unspecified hyperlipidemia   . Dysthymic disorder   . Other and unspecified coagulation defects   . Malignant neoplasm of breast (female), unspecified site   . Diaphragmatic hernia without mention of obstruction or gangrene   . PAD (peripheral artery disease)   . Colitis, ulcerative     Left sided  . PVD (peripheral vascular disease)     ROS: [x]  Positive   [ ]  Denies    General: [ ]  Weight loss, [ ]  Fever, [ ]  chills Neurologic: [ ]  Dizziness, [ ]  Blackouts, [ ]  Seizure [ ]  Stroke, [ ]  "Mini stroke", [ ]  Slurred speech, [ ]  Temporary blindness; [ ]  weakness in arms or legs, [ ]  Hoarseness Cardiac: [ ]  Chest pain/pressure, [ ]  Shortness of breath at rest [ ]  Shortness of breath with exertion, [ ]  Atrial fibrillation or irregular heartbeat Vascular: [ ]  Pain in legs with walking, [ ]  Pain in legs at rest, [ ]  Pain in legs at night,  [ ]  Non-healing ulcer, [ ]  Blood clot in vein/DVT,   Pulmonary: [ ]  Home oxygen, [ ]  Productive cough, [ ]  Coughing up blood, [ ]  Asthma,  [ ]  Wheezing Musculoskeletal:  [ ]  Arthritis, [ ]  Low back pain, [ ]  Joint pain Hematologic: [ ]  Easy Bruising, [ ]  Anemia; [ ]  Hepatitis Gastrointestinal: [ ]  Blood in stool, [ ]  Gastroesophageal Reflux/heartburn, [ ]  Trouble swallowing Urinary: [ ]  chronic Kidney disease, [ ]  on HD  - [ ]  MWF or [ ]  TTHS, [ ]  Burning with urination, [ ]  Difficulty urinating Skin: [ ]  Rashes, [ ]  Wounds Psychological: [ ]  Anxiety, [ ]  Depression   Social History History  Substance Use Topics  . Smoking status: Former Smoker    Quit date: 12/08/2002  . Smokeless tobacco: Never Used  . Alcohol Use: 7.0 oz/week    14 drink(s) per week     2 drinks per night    Family History Family History  Problem Relation Age of Onset  . Heart disease Father   . Colon cancer Neg Hx   . Lung cancer Maternal Grandfather     Allergies  Allergen Reactions  . Ciprofloxacin     Hives   . Sulfonamide Derivatives Other (See Comments)    Unknown reaction    Current Outpatient Prescriptions  Medication Sig Dispense Refill  . ALPRAZolam (XANAX) 0.25 MG tablet Take 0.25 mg by mouth at bedtime as needed. For anxiety      . aspirin 81 MG EC tablet Take 81 mg by mouth daily.        [name] dextrose 5 % SOLN 50 mL with cefTRIAXone 1 G SOLR 1 g Inject 1 g into the vein daily.  5 each  0  . ibuprofen (ADVIL) 200 MG tablet Take 400 mg by mouth every 6 (six) hours as needed. For headache pain      .  omeprazole (PRILOSEC) 20 MG capsule Take 20 mg by mouth daily.      . sertraline (ZOLOFT) 100 MG tablet Take 50 mg by mouth daily.       [name] DISCONTD: lansoprazole (PREVACID) 30 MG capsule Take 30 mg by mouth daily.        [name] DISCONTD: metoCLOPramide (REGLAN) 10 MG tablet Take one tab one half hour before meals and at bedtime  40 tablet  3    Physical Examination  Filed Vitals:   09/14/12 1601  BP: 141/93  Pulse: 79  Resp: 18    Body mass index is 26.93 kg/(m^2).  General:  WDWN in NAD Gait: Normal HEENT: WNL Eyes: Pupils equal Pulmonary: normal non-labored breathing , without Rales, rhonchi,  wheezing Cardiac: RRR, without  Murmurs, rubs or gallops; No carotid bruits Abdomen: soft, NT, no masses Skin: no rashes, ulcers noted Vascular Exam/Pulses: Lower extremity pulses are not palpable but she is  well perfused and there are no open ulcerations  Extremities without ischemic changes, no Gangrene , no cellulitis; no open wounds;  Musculoskeletal: no muscle wasting or atrophy  Neurologic: A&O X 3; Appropriate Affect ; SENSATION: normal; MOTOR FUNCTION:  moving all extremities equally. Speech is fluent/normal  Non-Invasive Vascular Imaging: ABIs today are 0.76 and monophasic on the right, 0.80 and monophasic on the left which is a very slight diminished finding on the left.  ASSESSMENT/PLAN: I discussed the options with the patient today as far as other diagnostics, the patient states that she does not want bypass surgery which she has been told in the past she needed, she would like to come back and speak with Dr. Edilia Bo himself in the next few weeks. We will have her set up to do so, then she can decide with Dr. Edilia Bo what will be the best treatment option. The patient's in agreement with this, her questions were encouraged and answered.  Lauree Chandler ANP  [facility]inic M.D.: Hart Rochester

## 2012-09-16 ENCOUNTER — Ambulatory Visit: Payer: Medicare Other | Admitting: Neurosurgery

## 2012-09-17 ENCOUNTER — Ambulatory Visit: Payer: Medicare Other | Admitting: Neurosurgery

## 2012-09-24 ENCOUNTER — Ambulatory Visit (HOSPITAL_COMMUNITY)
Admission: RE | Admit: 2012-09-24 | Discharge: 2012-09-24 | Disposition: A | Discharge status: Home / Self Care | Payer: Medicare Other | Source: Ambulatory Visit | Attending: Family Medicine | Admitting: Family Medicine

## 2012-09-24 DIAGNOSIS — M79609 Pain in unspecified limb: Secondary | ICD-10-CM

## 2012-09-24 DIAGNOSIS — M79606 Pain in leg, unspecified: Secondary | ICD-10-CM

## 2012-09-24 NOTE — Progress Notes (Signed)
*  Preliminary Results* Left lower extremity venous duplex completed. Left lower extremity is negative for deep vein thrombosis. There is evidence of thrombosis involving the great saphenous vein vs varicosity in the area of the medial left knee. There is no evidence of left Baker's cyst. Preliminary results discussed with Dr.Miller.  09/24/2012 2:39 PM Gertie Fey, RDMS, RDCS

## 2012-09-29 ENCOUNTER — Ambulatory Visit: Payer: Medicare Other | Admitting: Vascular Surgery

## 2012-10-12 ENCOUNTER — Encounter: Payer: Self-pay | Admitting: Vascular Surgery

## 2012-10-13 ENCOUNTER — Ambulatory Visit: Payer: Medicare Other | Admitting: Vascular Surgery

## 2012-10-19 ENCOUNTER — Encounter: Payer: Self-pay | Admitting: Vascular Surgery

## 2012-10-20 ENCOUNTER — Encounter: Payer: Self-pay | Admitting: Vascular Surgery

## 2012-10-20 ENCOUNTER — Ambulatory Visit (INDEPENDENT_AMBULATORY_CARE_PROVIDER_SITE_OTHER): Payer: Medicare Other | Admitting: Vascular Surgery

## 2012-10-20 VITALS — BP 150/92 | HR 79 | Ht 63.0 in | Wt 155.6 lb

## 2012-10-20 DIAGNOSIS — I70219 Atherosclerosis of native arteries of extremities with intermittent claudication, unspecified extremity: Secondary | ICD-10-CM

## 2012-10-20 NOTE — Progress Notes (Signed)
[facility] [place]  Patient name: Theresa [name] MRN: [id number] DOB: 11/24/1946 Sex: female  REASON FOR VISIT: bilateral lower extremity claudication.  HPI: Theresa [name] is a 66 y.o. female who was seen in our office on 09/14/2012 by Kallie Edward, NP. She has claudication. She asked to be seen to discuss this with me further. She gives a history of bilateral calf claudication which is brought on by ambulation and relieved with rest. Her symptoms are equal in both lower extremities. She's had these symptoms for many years and has been no significant change in her symptoms. She denies rest pain. She has had some paresthesias in her feet. She denies any history of nonhealing wounds.   REVIEW OF SYSTEMS: Arly.Keller ] denotes positive finding; [  ] denotes negative finding  CARDIOVASCULAR:  [ ]  chest pain   [ ]  dyspnea on exertion    CONSTITUTIONAL:  [ ]  fever   [ ]  chills  PHYSICAL EXAM: Filed Vitals:   10/20/12 1624  BP: 150/92  Pulse: 79  Height: 5\' 3"  (1.6 m)  Weight: 155 lb 9.6 oz (70.58 kg)  SpO2: 95%   Body mass index is 27.56 kg/(m^2). GENERAL: The patient is a well-nourished female, in no acute distress. The vital signs are documented above. CARDIOVASCULAR: There is a regular rate and rhythm. I do not detect carotid bruits. She has normal femoral pulses. I cannot palpate popliteal or pedal pulses. She has no significant lower extremity swelling.  PULMONARY: There is good air exchange bilaterally without wheezing or rales. NEURO: There is no focal weakness or paresthesias.  I reviewed her most recent Doppler study which showed an ABI of 76% on the right and 80% on the left.  MEDICAL ISSUES:  Atherosclerosis of native arteries of the extremities with intermittent claudication Based on her exam she has evidence of bilateral infrainguinal arterial occlusive disease. She tells me that she previously undergone arteriography and was told by Dr. Nanetta Batty  that she was not a candidate for endovascular intervention. Does, I suspected of her symptoms progressed she would require a bypass surgery. However, currently her symptoms are tolerable and we have discussed a structured walking program. She quit smoking 13 years ago. I've ordered follow up ABIs for 6 months and I will see her back at that time. Certainly if her symptoms progress we could consider arteriography and revascularization. However currently her symptoms are tolerable.   DICKSON,CHRISTOPHER S [facility] [place] Beeper: [phone]

## 2012-10-20 NOTE — Assessment & Plan Note (Signed)
Based on her exam she has evidence of bilateral infrainguinal arterial occlusive disease. She tells me that she previously undergone arteriography and was told by Dr. Nanetta Batty that she was not a candidate for endovascular intervention. Does, I suspected of her symptoms progressed she would require a bypass surgery. However, currently her symptoms are tolerable and we have discussed a structured walking program. She quit smoking 13 years ago. I've ordered follow up ABIs for 6 months and I will see her back at that time. Certainly if her symptoms progress we could consider arteriography and revascularization. However currently her symptoms are tolerable.

## 2012-10-21 NOTE — Addendum Note (Signed)
Addended by: Sharee Pimple on: 10/21/2012 02:05 PM   Modules accepted: Orders

## 2013-02-02 ENCOUNTER — Telehealth: Payer: Self-pay | Admitting: Internal Medicine

## 2013-02-02 NOTE — Telephone Encounter (Signed)
, °

## 2013-02-08 ENCOUNTER — Other Ambulatory Visit: Payer: Medicare Other | Admitting: Lab

## 2013-02-09 ENCOUNTER — Other Ambulatory Visit: Payer: Self-pay | Admitting: *Deleted

## 2013-02-09 ENCOUNTER — Telehealth: Payer: Self-pay | Admitting: Internal Medicine

## 2013-02-09 NOTE — Telephone Encounter (Signed)
pt aware to go to radiology after 3.25.14 lab

## 2013-02-09 NOTE — Progress Notes (Signed)
Pt FTKA for lab and CXR, onx tx sch made to cx f/u 3/6 and to r/s appts

## 2013-02-09 NOTE — Telephone Encounter (Signed)
pt came in to r/s miss appt due to her husband falling on the ice.Theresa KitchenMarland KitchenDone

## 2013-02-10 ENCOUNTER — Ambulatory Visit: Payer: Medicare Other | Admitting: Internal Medicine

## 2013-02-24 ENCOUNTER — Other Ambulatory Visit: Payer: Self-pay | Admitting: *Deleted

## 2013-02-24 DIAGNOSIS — C50919 Malignant neoplasm of unspecified site of unspecified female breast: Secondary | ICD-10-CM

## 2013-03-01 ENCOUNTER — Telehealth: Payer: Self-pay | Admitting: Internal Medicine

## 2013-03-01 ENCOUNTER — Telehealth: Payer: Self-pay | Admitting: Gastroenterology

## 2013-03-01 ENCOUNTER — Other Ambulatory Visit: Payer: Medicare Other | Admitting: Lab

## 2013-03-01 NOTE — Telephone Encounter (Signed)
Pt states she has been nauseated for about 2 weeks. States she has not appetite and has lost some weight. Pt would like to be seen. Pt scheduled to see Dr. Arlyce Dice Friday 03/04/13@3 :45pm. Pt aware of appt date and time.

## 2013-03-02 ENCOUNTER — Ambulatory Visit (HOSPITAL_COMMUNITY)
Admission: RE | Admit: 2013-03-02 | Discharge: 2013-03-02 | Disposition: A | Discharge status: Home / Self Care | Payer: Medicare Other | Source: Ambulatory Visit | Attending: Internal Medicine | Admitting: Internal Medicine

## 2013-03-02 ENCOUNTER — Other Ambulatory Visit (HOSPITAL_BASED_OUTPATIENT_CLINIC_OR_DEPARTMENT_OTHER): Payer: Medicare Other | Admitting: Lab

## 2013-03-02 DIAGNOSIS — C50919 Malignant neoplasm of unspecified site of unspecified female breast: Secondary | ICD-10-CM

## 2013-03-02 DIAGNOSIS — Z9089 Acquired absence of other organs: Secondary | ICD-10-CM | POA: Insufficient documentation

## 2013-03-02 LAB — COMPREHENSIVE METABOLIC PANEL (CC13)
ALT: 37 U/L (ref 0–55)
Albumin: 3.1 g/dL — ABNORMAL LOW (ref 3.5–5.0)
CO2: 26 mEq/L (ref 22–29)
Chloride: 104 mEq/L (ref 98–107)
Potassium: 3.1 mEq/L — ABNORMAL LOW (ref 3.5–5.1)
Sodium: 143 mEq/L (ref 136–145)
Total Bilirubin: 0.64 mg/dL (ref 0.20–1.20)
Total Protein: 7 g/dL (ref 6.4–8.3)

## 2013-03-02 LAB — CBC WITH DIFFERENTIAL/PLATELET
Eosinophils Absolute: 0.1 10*3/uL (ref 0.0–0.5)
MONO#: 0.9 10*3/uL (ref 0.1–0.9)
MONO%: 6.8 % (ref 0.0–14.0)
NEUT#: 8.7 10*3/uL — ABNORMAL HIGH (ref 1.5–6.5)
RBC: 5.08 10*6/uL (ref 3.70–5.45)
RDW: 14.7 % — ABNORMAL HIGH (ref 11.2–14.5)
WBC: 12.5 10*3/uL — ABNORMAL HIGH (ref 3.9–10.3)
lymph#: 2.8 10*3/uL (ref 0.9–3.3)

## 2013-03-02 NOTE — Progress Notes (Signed)
Quick Note:  Call patient with the result and order K Dur 20 meq po qd X 7 days ______ 

## 2013-03-03 ENCOUNTER — Telehealth: Payer: Self-pay | Admitting: Internal Medicine

## 2013-03-03 ENCOUNTER — Ambulatory Visit (HOSPITAL_BASED_OUTPATIENT_CLINIC_OR_DEPARTMENT_OTHER): Payer: Medicare Other | Admitting: Internal Medicine

## 2013-03-03 ENCOUNTER — Encounter: Payer: Self-pay | Admitting: Internal Medicine

## 2013-03-03 VITALS — BP 124/77 | HR 109 | Temp 98.2°F | Resp 20 | Ht 63.0 in | Wt 142.6 lb

## 2013-03-03 DIAGNOSIS — C50919 Malignant neoplasm of unspecified site of unspecified female breast: Secondary | ICD-10-CM

## 2013-03-03 DIAGNOSIS — Z171 Estrogen receptor negative status [ER-]: Secondary | ICD-10-CM

## 2013-03-03 DIAGNOSIS — Z853 Personal history of malignant neoplasm of breast: Secondary | ICD-10-CM

## 2013-03-03 LAB — CANCER ANTIGEN 27.29: CA 27.29: 44 U/mL — ABNORMAL HIGH (ref 0–39)

## 2013-03-03 NOTE — Progress Notes (Signed)
[facility] [phone]   Fax:([phone]  OFFICE PROGRESS NOTE  Daisy Floro, MD [address] 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   PRIOR THERAPY:  1. Status post right breast lumpectomy with sentinel lymph node biopsies on January 08, 2001, revealing a 1.2 cm infiltrating ductal carcinoma. The tumor was positive for estrogen and progesterone receptors and negative for HER-2/neu.  2. Status post postoperative radiotherapy to the right breast.  CURRENT THERAPY: Arimidex 1 mg p.o. daily, started May 2002.   INTERVAL HISTORY: Theresa [name] 67 y.o. female returns to the clinic today for annual followup visit. The patient is doing fine today with no specific complaints. She denied having any chest pain, shortness breath, cough or hemoptysis. She denied having any weight loss or night sweats. She has no palpable lymphadenopathy. The patient had a mammogram performed in September of 2013 and it showed no evidence for disease recurrence. She had repeat CBC, comprehensive metabolic panel, CA 27.29 as well as chest x-ray performed recently and she is here for evaluation and discussion of her lab and imaging results.  MEDICAL HISTORY: Past Medical History  Diagnosis Date  . Stricture and stenosis of esophagus   . Other and unspecified hyperlipidemia   . Dysthymic disorder   . Other and unspecified coagulation defects   . Malignant neoplasm of breast (female), unspecified site   . Diaphragmatic hernia without mention of obstruction or gangrene   . PAD (peripheral artery disease)   . Colitis, ulcerative     Left sided  . PVD (peripheral vascular disease)     ALLERGIES:  is allergic to ciprofloxacin and sulfonamide derivatives.  MEDICATIONS:  Current Outpatient Prescriptions  Medication Sig Dispense Refill  . aspirin 81 MG EC tablet Take 81 mg  by mouth daily.        [name] ibuprofen (ADVIL) 200 MG tablet Take 400 mg by mouth every 6 (six) hours as needed. For headache pain      . omeprazole (PRILOSEC) 20 MG capsule Take 20 mg by mouth daily.      . sertraline (ZOLOFT) 100 MG tablet Take 50 mg by mouth daily.       [name] ALPRAZolam (XANAX) 0.25 MG tablet Take 0.25 mg by mouth at bedtime as needed. For anxiety      . [DISCONTINUED] lansoprazole (PREVACID) 30 MG capsule Take 30 mg by mouth daily.        . [DISCONTINUED] metoCLOPramide (REGLAN) 10 MG tablet Take one tab one half hour before meals and at bedtime  40 tablet  3   No current facility-administered medications for this visit.    SURGICAL HISTORY:  Past Surgical History  Procedure Laterality Date  . Cholecystectomy    . Appendectomy    . Tubal ligation    . Breast lumpectomy      Right    REVIEW OF SYSTEMS:  A comprehensive review of systems was negative.   PHYSICAL EXAMINATION: General appearance: alert, cooperative and no distress Head: Normocephalic, without obvious abnormality, atraumatic Lymph nodes: Cervical, supraclavicular, and axillary nodes normal. Resp: clear to auscultation bilaterally Cardio: regular rate and rhythm, S1, S2 normal, no murmur, click, rub or gallop GI: soft, non-tender; bowel sounds normal; no masses,  no organomegaly Extremities: extremities normal, atraumatic, no cyanosis or edema Breast exam. Showed no palpable masses in the breasts bilaterally and no palpable axillary lymphadenopathy bilaterally.  ECOG PERFORMANCE STATUS: 0 - Asymptomatic  Blood pressure 124/77, pulse 109, temperature 98.2 F (36.8 C), temperature source Oral, resp. rate 20, height 5\' 3"  (1.6 m), weight 142 lb 9.6 oz (64.683 kg), SpO2 98.00%.  LABORATORY DATA: Lab Results  Component Value Date   WBC 12.5* 03/02/2013   HGB 15.9 03/02/2013   HCT 48.8* 03/02/2013   MCV 96.1 03/02/2013   PLT 260 03/02/2013      Chemistry      Component Value Date/Time   NA 143 03/02/2013  1531   NA 138 06/22/2012 0345   K 3.1 Repeated and Verified* 03/02/2013 1531   K 3.6 06/22/2012 0345   CL 104 03/02/2013 1531   CL 110 06/22/2012 0345   CO2 26 03/02/2013 1531   CO2 19 06/22/2012 0345   BUN 10.0 03/02/2013 1531   BUN 7 06/22/2012 0345   CREATININE 0.9 03/02/2013 1531   CREATININE 0.70 06/22/2012 0345      Component Value Date/Time   CALCIUM 9.1 03/02/2013 1531   CALCIUM 9.2 06/22/2012 0345   ALKPHOS 98 03/02/2013 1531   ALKPHOS 76 02/10/2012 1409   AST 34 03/02/2013 1531   AST 33 02/10/2012 1409   ALT 37 03/02/2013 1531   ALT 15 02/10/2012 1409   BILITOT 0.64 03/02/2013 1531   BILITOT 0.7 02/10/2012 1409       RADIOGRAPHIC STUDIES: Dg Chest 2 View  03/02/2013  *RADIOLOGY REPORT*  Clinical Data: Malignant neoplasm of the breast  CHEST - 2 VIEW  Comparison: 08/13/2011  Findings: Heart size is normal.  There is no pleural effusion or edema identified.  No airspace consolidation identified. Cholecystectomy clips noted within the right upper quadrant of the abdomen. The visualized osseous structures appear unremarkable.  IMPRESSION:  1.  No acute cardiopulmonary abnormalities.   Original Report Authenticated By: Signa Kell, M.D.     ASSESSMENT: This is a very pleasant 67 years old white female with history of stage I right breast infiltrating ductal carcinoma status post surgical resection followed by postoperative radiotherapy and completion of 10 years of hormonal therapy with Arimidex. The patient is currently on observation with no evidence for disease recurrence on the recent blood or imaging studies.  PLAN: I discussed the lab and imaging results with the patient today. I recommended for her to continue on observation with repeat blood work and chest x-ray in one year.  She will come back for followup visit at that time.  All questions were answered. The patient knows to call the clinic with any problems, questions or concerns. We can certainly see the patient much sooner if  necessary.

## 2013-03-04 ENCOUNTER — Ambulatory Visit (INDEPENDENT_AMBULATORY_CARE_PROVIDER_SITE_OTHER): Payer: Medicare Other | Admitting: Gastroenterology

## 2013-03-04 ENCOUNTER — Encounter: Payer: Self-pay | Admitting: Gastroenterology

## 2013-03-04 ENCOUNTER — Telehealth: Payer: Self-pay | Admitting: *Deleted

## 2013-03-04 VITALS — BP 128/70 | HR 116 | Ht 63.0 in | Wt 145.0 lb

## 2013-03-04 DIAGNOSIS — R112 Nausea with vomiting, unspecified: Secondary | ICD-10-CM

## 2013-03-04 DIAGNOSIS — K5289 Other specified noninfective gastroenteritis and colitis: Secondary | ICD-10-CM

## 2013-03-04 MED ORDER — ONDANSETRON HCL 4 MG PO TABS: 4.0000 mg | ORAL_TABLET | Freq: Three times a day (TID) | ORAL | Status: DC | PRN

## 2013-03-04 NOTE — Assessment & Plan Note (Signed)
Patient has had long-standing nausea with vomiting. I remain suspicious this could be a medication effect. Of note weight has been stable.  Recommendations #1 patient is agreeable to holding Zoloft. This will be tapered over 4 weeks. #2 Zofran when necessary #3 to consider empiric therapy again with Reglan if she's not improved after holding Zoloft

## 2013-03-04 NOTE — Progress Notes (Signed)
History of Present Illness: Theresa [name] has returned for evaluation of nausea and vomiting.  She was seen on multiple occasions in 2012 for similar problems. Workup including upper endoscopy was negative. She was never able to complete a gastric emptying scan. There was a suspicion that she had symptoms secondary to Zoloft. She never returned for an opportunity to hold this medication. Symptoms continue. She has free standing nausea and may be worsened postprandially. She gags when eating and occasionally vomits food. At the same time, weight has been stable. She also complains of periumbilical discomfort. She generally has loose stools. There've been no other changes in her medication.    Past Medical History  Diagnosis Date  . Stricture and stenosis of esophagus   . Other and unspecified hyperlipidemia   . Dysthymic disorder   . Other and unspecified coagulation defects   . Malignant neoplasm of breast (female), unspecified site   . Diaphragmatic hernia without mention of obstruction or gangrene   . PAD (peripheral artery disease)   . Colitis, ulcerative     Left sided  . PVD (peripheral vascular disease)    Past Surgical History  Procedure Laterality Date  . Cholecystectomy    . Appendectomy    . Tubal ligation    . Breast lumpectomy      Right   family history includes Heart disease in her father and Lung cancer in her maternal grandfather.  There is no history of Colon cancer. Current Outpatient Prescriptions  Medication Sig Dispense Refill  . ALPRAZolam (XANAX) 0.25 MG tablet Take 0.25 mg by mouth at bedtime as needed. For anxiety      . aspirin 81 MG EC tablet Take 81 mg by mouth daily.        [name] ibuprofen (ADVIL) 200 MG tablet Take 400 mg by mouth every 6 (six) hours as needed. For headache pain      . omeprazole (PRILOSEC) 20 MG capsule Take 20 mg by mouth daily.      . sertraline (ZOLOFT) 100 MG tablet Take 50 mg by mouth daily.       . [DISCONTINUED] lansoprazole  (PREVACID) 30 MG capsule Take 30 mg by mouth daily.        . [DISCONTINUED] metoCLOPramide (REGLAN) 10 MG tablet Take one tab one half hour before meals and at bedtime  40 tablet  3   No current facility-administered medications for this visit.   Allergies as of 03/04/2013 - Review Complete 03/04/2013  Allergen Reaction Noted  . Ciprofloxacin  06/19/2011  . Sulfonamide derivatives Other (See Comments) 02/05/2009    reports that she quit smoking about 10 years ago. She has never used smokeless tobacco. She reports that she drinks about 7.0 ounces of alcohol per week. She reports that she does not use illicit drugs.     Review of Systems: Pertinent positive and negative review of systems were noted in the above HPI section. All other review of systems were otherwise negative.  Vital signs were reviewed in today's medical record Physical Exam: General: Well developed , well nourished, no acute distress Skin: anicteric Head: Normocephalic and atraumatic Eyes:  sclerae anicteric, EOMI Ears: Normal auditory acuity Mouth: No deformity or lesions Neck: Supple, no masses or thyromegaly Lungs: Clear throughout to auscultation Heart: Regular rate and rhythm; no murmurs, rubs or bruits Abdomen: Soft,  and non distended. No masses, hepatosplenomegaly or hernias noted. Normal Bowel sounds. There is no succussion splash.  There is mild tenderness in the peri-umbilical area  without guarding or rebound Rectal:deferred Musculoskeletal: Symmetrical with no gross deformities  Skin: No lesions on visible extremities Pulses:  Normal pulses noted Extremities: No clubbing, cyanosis, edema or deformities noted Neurological: Alert oriented x 4, grossly nonfocal Cervical Nodes:  No significant cervical adenopathy Inguinal Nodes: No significant inguinal adenopathy Psychological:  Alert and cooperative. Normal mood and affect

## 2013-03-04 NOTE — Telephone Encounter (Signed)
Message copied by Caren Griffins on Fri Mar 04, 2013  3:02 PM ------      Message from: Si Gaul      Created: Wed Mar 02, 2013  8:50 PM       Call patient with the result and order K Dur 20 meq po qd X 7 days. ------

## 2013-03-04 NOTE — Patient Instructions (Addendum)
Lower zoloft to 75mg  daily for 1 week, 50mg  daily for 1 week 25mg  daily for 1 week, then discontinue

## 2013-03-04 NOTE — Telephone Encounter (Signed)
Pt verbalized understanding regarding daily x 7 days.  SLJ

## 2013-03-04 NOTE — Assessment & Plan Note (Signed)
Theresa [name] has loose stools which are stable. We'll not add further medicines at this point.

## 2013-03-05 NOTE — Patient Instructions (Signed)
No evidence for disease recurrence on the recent blood or imaging studies. Followup in one year.

## 2013-03-14 ENCOUNTER — Other Ambulatory Visit: Payer: Self-pay | Admitting: *Deleted

## 2013-03-14 DIAGNOSIS — E876 Hypokalemia: Secondary | ICD-10-CM

## 2013-03-14 MED ORDER — POTASSIUM CHLORIDE CRYS ER 20 MEQ PO TBCR: 20.0000 meq | EXTENDED_RELEASE_TABLET | ORAL | Status: DC

## 2013-03-22 ENCOUNTER — Other Ambulatory Visit: Payer: Self-pay | Admitting: Family Medicine

## 2013-03-22 DIAGNOSIS — R110 Nausea: Secondary | ICD-10-CM

## 2013-03-29 ENCOUNTER — Ambulatory Visit
Admission: RE | Admit: 2013-03-29 | Discharge: 2013-03-29 | Disposition: A | Discharge status: Home / Self Care | Payer: Medicare Other | Source: Ambulatory Visit | Attending: Family Medicine | Admitting: Family Medicine

## 2013-03-29 DIAGNOSIS — R110 Nausea: Secondary | ICD-10-CM

## 2013-04-04 ENCOUNTER — Ambulatory Visit: Payer: Medicare Other | Admitting: Gastroenterology

## 2013-04-13 ENCOUNTER — Other Ambulatory Visit: Payer: Self-pay | Admitting: Family Medicine

## 2013-04-13 DIAGNOSIS — R109 Unspecified abdominal pain: Secondary | ICD-10-CM

## 2013-04-20 ENCOUNTER — Ambulatory Visit: Payer: Medicare Other | Admitting: Vascular Surgery

## 2013-04-21 ENCOUNTER — Ambulatory Visit
Admission: RE | Admit: 2013-04-21 | Discharge: 2013-04-21 | Disposition: A | Discharge status: Home / Self Care | Payer: Medicare Other | Source: Ambulatory Visit | Attending: Family Medicine | Admitting: Family Medicine

## 2013-04-21 DIAGNOSIS — R109 Unspecified abdominal pain: Secondary | ICD-10-CM

## 2013-04-21 MED ORDER — IOHEXOL 350 MG/ML SOLN
100.0000 mL | Freq: Once | INTRAVENOUS | Status: AC | PRN
  Administered 2013-04-21: 100 mL via INTRAVENOUS

## 2013-05-02 ENCOUNTER — Emergency Department (HOSPITAL_COMMUNITY)
Admission: EM | Admit: 2013-05-02 | Discharge: 2013-05-02 | Disposition: A | Discharge status: Home / Self Care | Payer: Medicare Other | Attending: Emergency Medicine | Admitting: Emergency Medicine

## 2013-05-02 ENCOUNTER — Encounter (HOSPITAL_COMMUNITY): Payer: Self-pay | Admitting: Emergency Medicine

## 2013-05-02 ENCOUNTER — Emergency Department (HOSPITAL_COMMUNITY): Payer: Medicare Other

## 2013-05-02 DIAGNOSIS — Z862 Personal history of diseases of the blood and blood-forming organs and certain disorders involving the immune mechanism: Secondary | ICD-10-CM | POA: Insufficient documentation

## 2013-05-02 DIAGNOSIS — Z7982 Long term (current) use of aspirin: Secondary | ICD-10-CM | POA: Insufficient documentation

## 2013-05-02 DIAGNOSIS — W19XXXA Unspecified fall, initial encounter: Secondary | ICD-10-CM

## 2013-05-02 DIAGNOSIS — Y9289 Other specified places as the place of occurrence of the external cause: Secondary | ICD-10-CM | POA: Insufficient documentation

## 2013-05-02 DIAGNOSIS — I739 Peripheral vascular disease, unspecified: Secondary | ICD-10-CM | POA: Insufficient documentation

## 2013-05-02 DIAGNOSIS — Z87891 Personal history of nicotine dependence: Secondary | ICD-10-CM | POA: Insufficient documentation

## 2013-05-02 DIAGNOSIS — S62308A Unspecified fracture of other metacarpal bone, initial encounter for closed fracture: Secondary | ICD-10-CM

## 2013-05-02 DIAGNOSIS — Z853 Personal history of malignant neoplasm of breast: Secondary | ICD-10-CM | POA: Insufficient documentation

## 2013-05-02 DIAGNOSIS — Z8679 Personal history of other diseases of the circulatory system: Secondary | ICD-10-CM | POA: Insufficient documentation

## 2013-05-02 DIAGNOSIS — S0990XA Unspecified injury of head, initial encounter: Secondary | ICD-10-CM | POA: Insufficient documentation

## 2013-05-02 DIAGNOSIS — Y9301 Activity, walking, marching and hiking: Secondary | ICD-10-CM | POA: Insufficient documentation

## 2013-05-02 DIAGNOSIS — Z8719 Personal history of other diseases of the digestive system: Secondary | ICD-10-CM | POA: Insufficient documentation

## 2013-05-02 DIAGNOSIS — S62309A Unspecified fracture of unspecified metacarpal bone, initial encounter for closed fracture: Secondary | ICD-10-CM | POA: Insufficient documentation

## 2013-05-02 DIAGNOSIS — E785 Hyperlipidemia, unspecified: Secondary | ICD-10-CM | POA: Insufficient documentation

## 2013-05-02 DIAGNOSIS — W010XXA Fall on same level from slipping, tripping and stumbling without subsequent striking against object, initial encounter: Secondary | ICD-10-CM | POA: Insufficient documentation

## 2013-05-02 DIAGNOSIS — S0010XA Contusion of unspecified eyelid and periocular area, initial encounter: Secondary | ICD-10-CM | POA: Insufficient documentation

## 2013-05-02 DIAGNOSIS — K5190 Ulcerative colitis, unspecified, without complications: Secondary | ICD-10-CM | POA: Insufficient documentation

## 2013-05-02 MED ORDER — ALPRAZOLAM 0.5 MG PO TABS: 0.5000 mg | ORAL_TABLET | Freq: Once | ORAL | Status: DC

## 2013-05-02 NOTE — ED Notes (Signed)
Pt states that she was trying to get in her boat Saturday night and slipped and fell.  Pt hit her head.  C/o facial pain and left hand pain.

## 2013-05-02 NOTE — ED Notes (Signed)
During triage, pt's heart rate dropped to mid 40s and O2 dropped to 85% (tracking). Pt stated she felt fine.  EKG done.

## 2013-05-02 NOTE — ED Notes (Signed)
Patient transported to CT 

## 2013-05-02 NOTE — ED Provider Notes (Signed)
History    CSN: [id number] Arrival date & time 05/02/13  1806  First MD Initiated Contact with Patient 05/02/13 1851     Chief Complaint  Patient presents with  . Fall  . Facial Pain  . Hand Pain   HPI Comments: Patient is a 67 year old female who presents 2 days status post fall.  She reports walking down to the car along a dark path [place] this [place] going to her car when she stepped off and fell forward landed flat on the ground with the left side of her head and her left wrist.  She reports no loss of consciousness, no nausea or vomiting, no headache other than tender over the area she struck above the left orbit.  She does take an aspirin daily but no other anticoagulation.    Reports the left and hertz especially with pushing or intense wheezing.  No radiating pain.  Has taken Tylenol which helped to use the pain but did not help with the pain while pushing.   Past Medical History  Diagnosis Date  . Stricture and stenosis of esophagus   . Other and unspecified hyperlipidemia   . Dysthymic disorder   . Other and unspecified coagulation defects   . Malignant neoplasm of breast (female), unspecified site   . Diaphragmatic hernia without mention of obstruction or gangrene   . PAD (peripheral artery disease)   . Colitis, ulcerative     Left sided  . PVD (peripheral vascular disease)     Past Surgical History  Procedure Laterality Date  . Cholecystectomy    . Appendectomy    . Tubal ligation    . Breast lumpectomy      Right    Family History  Problem Relation Age of Onset  . Heart disease Father   . Colon cancer Neg Hx   . Lung cancer Maternal Grandfather     History  Substance Use Topics  . Smoking status: Former Smoker    Quit date: 12/08/2002  . Smokeless tobacco: Never Used  . Alcohol Use: 7.0 oz/week    14 drink(s) per week     Comment: 2 drinks per night    OB History   Grav Para Term Preterm Abortions TAB SAB Ect Mult Living                    Review of Systems  Constitutional: Negative for fever, chills, diaphoresis, activity change and fatigue.  HENT: Positive for facial swelling. Negative for hearing loss, nosebleeds, trouble swallowing, neck pain, neck stiffness and voice change.   Eyes: Positive for pain (above L eye where swollen).  Respiratory: Negative for apnea, cough, choking, shortness of breath and wheezing.   Cardiovascular: Negative for chest pain and palpitations.  Gastrointestinal: Negative for nausea and abdominal distention.  Endocrine: Negative.   Genitourinary: Negative.  Negative for urgency and flank pain.  Allergic/Immunologic: Negative.   Neurological: Negative for dizziness, seizures, syncope, speech difficulty, weakness, light-headedness, numbness and headaches.  Psychiatric/Behavioral: Negative.     Allergies  Ciprofloxacin and Sulfonamide derivatives  Home Medications   Current Outpatient Rx  Name  Route  Sig  Dispense  Refill  . ALPRAZolam (XANAX) 0.25 MG tablet   Oral   Take 0.25 mg by mouth at bedtime as needed. For anxiety         . aspirin 81 MG EC tablet   Oral   Take 81 mg by mouth every morning.          [name]  ibuprofen (ADVIL) 200 MG tablet   Oral   Take 400 mg by mouth every 6 (six) hours as needed. For headache pain         . Multiple Vitamin (MULTIVITAMIN WITH MINERALS) TABS   Oral   Take 1 tablet by mouth daily.         [name] omeprazole (PRILOSEC) 20 MG capsule   Oral   Take 20 mg by mouth every morning.          . ondansetron (ZOFRAN) 4 MG tablet   Oral   Take 1 tablet (4 mg total) by mouth every 8 (eight) hours as needed for nausea.   30 tablet   1   . sertraline (ZOLOFT) 100 MG tablet   Oral   Take 100 mg by mouth every morning.            BP 125/73  Pulse 77  Temp(Src) 98.9 F (37.2 C) (Oral)  Resp 12  SpO2 93%  Physical Exam  Nursing note and vitals reviewed. Constitutional: She appears well-developed and well-nourished. No distress.   HENT:  Head: Normocephalic. Head is with abrasion and with contusion. Head is without raccoon's eyes, without Battle's sign, without laceration, without right periorbital erythema and without left periorbital erythema.    Right Ear: External ear and ear canal normal.  Left Ear: External ear and ear canal normal.  Nose: Nose normal. Right sinus exhibits no maxillary sinus tenderness and no frontal sinus tenderness. Left sinus exhibits no maxillary sinus tenderness and no frontal sinus tenderness.  Eyes: Conjunctivae are normal. Right eye exhibits no discharge. Left eye exhibits no discharge. No scleral icterus.  Neck: No JVD present. No tracheal deviation present. No thyromegaly present.  Cardiovascular: Normal rate, regular rhythm, normal heart sounds and intact distal pulses.  Exam reveals no gallop and no friction rub.   No murmur heard. Pulmonary/Chest: Effort normal and breath sounds normal. No respiratory distress. She has no wheezes. She has no rales.  Abdominal: Soft.  Musculoskeletal: Normal range of motion. She exhibits no edema.       Left wrist: She exhibits tenderness (Over the proximal aspect of the fifth metacarpal - full range of motion, neurovascularly intact) and bony tenderness. She exhibits no swelling, no effusion, no crepitus and no deformity.  Neurological: She is alert. She exhibits normal muscle tone.  Gross Deficits: None Speech: Fluent Cerebellar: Negative rhomberg on repeat, normal RAM CN: CN II-XI intact Motor: UE and LE Myotomes 5+/5    Skin: Skin is warm and dry. No rash noted. She is not diaphoretic. No erythema. No pallor.  Psychiatric: She has a normal mood and affect. Her behavior is normal. Judgment and thought content normal.    ED Course  Procedures (including critical care time)  Labs Reviewed - No data to display Ct Head Wo Contrast  05/02/2013   *RADIOLOGY REPORT*  Clinical Data: Tripped and fell on 04/30/2013.  Left orbital and forehead  bruising.  Headaches.  CT HEAD WITHOUT CONTRAST  Technique:  Contiguous axial images were obtained from the base of the skull through the vertex without contrast.  Comparison: 09/29/2011  Findings: There is no intra or extra-axial fluid collection or mass lesion.  The basilar cisterns and ventricles have a normal appearance.  There is no CT evidence for acute infarction or hemorrhage.  Bone windows show soft tissue swelling in the left frontal region. There is preseptal, peri orbital soft tissue swelling.  The globes are intact.  Minimal soft tissue  swelling within the frontal and ethmoid air cells, consistent with chronic sinusitis.  No evidence for sinus wall fracture, orbital fracture.  IMPRESSION:  1.  No evidence for acute intracranial abnormality. 2.  Preseptal, periorbital soft tissue swelling without evidence for orbital fracture. 3. Left frontal scalp edema without underlying calvarial fracture.   Original Report Authenticated By: Norva Pavlov, M.D.   Dg Hand Complete Left  05/02/2013   *RADIOLOGY REPORT*  Clinical Data: Fall, left hand pain  LEFT HAND - COMPLETE 3+ VIEW  Comparison: None.  Findings: Although poorly visualized, a nondisplaced fracture involving the proximal aspect of the fifth metacarpal suspected.  Otherwise, no evidence of fracture or dislocation.  Mild degenerative changes at the second DIP joint.  Visualized soft tissues are grossly unremarkable.  IMPRESSION: Suspected nondisplaced fracture involving the proximal aspect of the fifth metacarpal, poorly visualized.  Correlate for point tenderness.   Original Report Authenticated By: Charline Bills, M.D.     1. Fall, initial encounter   2. Closed fracture of 5th metacarpal, initial encounter     MDM  Patient with no evidence of acute intracranial process.  There is a nondisplaced fracture the fifth metacarpal with point tenderness over the proximal aspect.  Will plan to immobilize and have followup with Ortho [facility].  No  prodromal symptoms.  Mechanical fall.        Andrena Mews, DO 05/03/13 [phone]

## 2013-05-02 NOTE — ED Notes (Signed)
Discharge instructions reviewed w/ pt., verbalizes understanding. No prescriptions provided at discharge. 

## 2013-05-05 NOTE — ED Provider Notes (Signed)
I saw and evaluated the patient, reviewed the resident's note and I agree with the findings and plan.   .Face to face Exam:  General:  Awake HEENT:  Atraumatic Resp:  Normal effort Abd:  Nondistended Neuro:No focal weakness   Nelia Shi, MD 05/05/13 1525

## 2013-05-17 ENCOUNTER — Encounter: Payer: Self-pay | Admitting: Vascular Surgery

## 2013-05-18 ENCOUNTER — Encounter: Payer: Self-pay | Admitting: Vascular Surgery

## 2013-05-18 ENCOUNTER — Ambulatory Visit (INDEPENDENT_AMBULATORY_CARE_PROVIDER_SITE_OTHER): Payer: Medicare Other | Admitting: Vascular Surgery

## 2013-05-18 ENCOUNTER — Encounter (INDEPENDENT_AMBULATORY_CARE_PROVIDER_SITE_OTHER): Payer: Medicare Other | Admitting: *Deleted

## 2013-05-18 VITALS — BP 119/86 | HR 107 | Ht 63.0 in | Wt 143.2 lb

## 2013-05-18 DIAGNOSIS — I70219 Atherosclerosis of native arteries of extremities with intermittent claudication, unspecified extremity: Secondary | ICD-10-CM

## 2013-05-18 NOTE — Progress Notes (Signed)
[facility] [place]  Patient name: Theresa [name] MRN: [id number] DOB: 24-Nov-1946 Sex: female  REASON FOR VISIT: follow up of peripheral vascular disease and also evaluate for possible mesenteric ischemia.  HPI: Theresa [name] is a 67 y.o. female who I seen in the past with peripheral vascular disease. I last saw her on 10/20/2012. He has stable bilateral lower extremity claudication. He extends his pain in her thighs and calves brought on by ambulation and relieved with rest. He symptoms have been stable over the last year. She denies any history of rest pain or history of nonhealing ulcers in her feet.   She has been having problems with nausea and has undergone extensive workup for this. She's had multiple endoscopies. Also she had a CT scan and there was some suspicion of possible occlusion of her distal splenic artery. It was felt that she could potentially have some evidence of chronic mesenteric ischemia. Over, I do not get any history of postprandial abdominal pain. Likewise she has not had any significant weight loss. She does have nausea which is not always related to eating.  Past Medical History  Diagnosis Date  . Stricture and stenosis of esophagus   . Other and unspecified hyperlipidemia   . Dysthymic disorder   . Other and unspecified coagulation defects   . Malignant neoplasm of breast (female), unspecified site   . Diaphragmatic hernia without mention of obstruction or gangrene   . PAD (peripheral artery disease)   . Colitis, ulcerative     Left sided  . PVD (peripheral vascular disease)    Family History  Problem Relation Age of Onset  . Heart disease Father   . Colon cancer Neg Hx   . Lung cancer Maternal Grandfather    SOCIAL HISTORY: History  Substance Use Topics  . Smoking status: Former Smoker    Quit date: 12/08/2002  . Smokeless tobacco: Never Used  . Alcohol Use: 7.0 oz/week    14 drink(s) per week     Comment: 2 drinks per  night   Allergies  Allergen Reactions  . Ciprofloxacin     Hives   . Sulfonamide Derivatives Other (See Comments)    Unknown reaction   REVIEW OF SYSTEMS: Arly.Keller ] denotes positive finding; [  ] denotes negative finding  CARDIOVASCULAR:  [ ]  chest pain   [ ]  chest pressure   [ ]  palpitations   [ ]  orthopnea   [ ]  dyspnea on exertion   Arly.Keller ] claudication   [ ]  rest pain   [ ]  DVT   [ ]  phlebitis PULMONARY:   [ ]  productive cough   [ ]  asthma   [ ]  wheezing NEUROLOGIC:   [ ]  weakness  [ ]  paresthesias  [ ]  aphasia  [ ]  amaurosis  [ ]  dizziness HEMATOLOGIC:   [ ]  bleeding problems   [ ]  clotting disorders MUSCULOSKELETAL:  [ ]  joint pain   [ ]  joint swelling [ ]  leg swelling GASTROINTESTINAL: [ ]   blood in stool  [ ]   hematemesis GENITOURINARY:  [ ]   dysuria  [ ]   hematuria PSYCHIATRIC:  [ ]  history of major depression INTEGUMENTARY:  [ ]  rashes  [ ]  ulcers CONSTITUTIONAL:  [ ]  fever   [ ]  chills  PHYSICAL EXAM: Filed Vitals:   05/18/13 1410  BP: 119/86  Pulse: 107  Height: 5\' 3"  (1.6 m)  Weight: 143 lb 3.2 oz (64.955 kg)  SpO2: 96%   Body  mass index is 25.37 kg/(m^2). GENERAL: The patient is a well-nourished female, in no acute distress. The vital signs are documented above. CARDIOVASCULAR: There is a regular rate and rhythm. I did not detect carotid bruits. She has palpable femoral pulses. I cannot palpate popliteal or pedal pulses. He has no significant lower extremity swelling. PULMONARY: There is good air exchange bilaterally without wheezing or rales. ABDOMEN: Soft and non-tender with normal pitched bowel sounds.  MUSCULOSKELETAL: There are no major deformities or cyanosis. NEUROLOGIC: No focal weakness or paresthesias are detected. SKIN: There are no ulcers or rashes noted. PSYCHIATRIC: The patient has a normal affect.  DATA:  I have independently interpreted her arterial Doppler study which shows monophasic Doppler signals in both feet. She has an ABI of 76% on the right  and 73% on the left. These ABIs are stable compared to her previous study in October of 2013.  I've also independently evaluated her CT angiogram. It appears that her celiac axis, superior mesenteric artery, and anterior mesenteric artery are patent. She may have some mild calcific disease at the origin of the superior mesenteric artery however I do not see evidence of significant narrowing. I agree that is difficult to follow the distal splenic artery although there is no evidence of splenic infarct on CT scan.  MEDICAL ISSUES:  PERIPHERAL VASCULAR DISEASE: She has evidence of bilateral infrainguinal arterial occlusive disease. Her symptoms are stable and I would not recommend arteriography and the she developed disabling claudication, rest pain, or nonhealing ulcer. I've encouraged her to stay as active as possible. Fortunately she is not a smoker. She is on aspirin. I have explained that she should discuss statins with her primary care physician as there is evidence at that the use of statins in patients with peripheral vascular disease does lower their overall mortality.  NAUSEA: I do not think she has evidence of chronic mesenteric ischemia. Her symptoms do not fit and based on my interpretation of her CT scan the celiac axis, inferior mesenteric artery, and superior mesenteric artery are patent. I have explained that if she continues to have symptoms in her workup is unrevealing that consideration could be given to proceeding with mesenteric arteriography although at this point I think the yield would be very low.   I'll plan on seeing her back in one year. He knows to call sooner she has problems.  Britney Captain S [facility] [place] Beeper: [phone]

## 2013-05-26 ENCOUNTER — Other Ambulatory Visit: Payer: Self-pay | Admitting: *Deleted

## 2013-05-26 DIAGNOSIS — I739 Peripheral vascular disease, unspecified: Secondary | ICD-10-CM

## 2013-07-04 ENCOUNTER — Emergency Department (HOSPITAL_COMMUNITY)
Admission: EM | Admit: 2013-07-04 | Discharge: 2013-07-04 | Disposition: A | Discharge status: Home / Self Care | Payer: Medicare Other | Attending: Emergency Medicine | Admitting: Emergency Medicine

## 2013-07-04 ENCOUNTER — Encounter (HOSPITAL_COMMUNITY): Payer: Self-pay | Admitting: *Deleted

## 2013-07-04 DIAGNOSIS — Z7982 Long term (current) use of aspirin: Secondary | ICD-10-CM | POA: Insufficient documentation

## 2013-07-04 DIAGNOSIS — Z8719 Personal history of other diseases of the digestive system: Secondary | ICD-10-CM | POA: Insufficient documentation

## 2013-07-04 DIAGNOSIS — Z8639 Personal history of other endocrine, nutritional and metabolic disease: Secondary | ICD-10-CM | POA: Insufficient documentation

## 2013-07-04 DIAGNOSIS — I809 Phlebitis and thrombophlebitis of unspecified site: Secondary | ICD-10-CM

## 2013-07-04 DIAGNOSIS — I8000 Phlebitis and thrombophlebitis of superficial vessels of unspecified lower extremity: Secondary | ICD-10-CM | POA: Insufficient documentation

## 2013-07-04 DIAGNOSIS — Z862 Personal history of diseases of the blood and blood-forming organs and certain disorders involving the immune mechanism: Secondary | ICD-10-CM | POA: Insufficient documentation

## 2013-07-04 DIAGNOSIS — Z8679 Personal history of other diseases of the circulatory system: Secondary | ICD-10-CM | POA: Insufficient documentation

## 2013-07-04 DIAGNOSIS — Z853 Personal history of malignant neoplasm of breast: Secondary | ICD-10-CM | POA: Insufficient documentation

## 2013-07-04 DIAGNOSIS — Z79899 Other long term (current) drug therapy: Secondary | ICD-10-CM | POA: Insufficient documentation

## 2013-07-04 DIAGNOSIS — Z87891 Personal history of nicotine dependence: Secondary | ICD-10-CM | POA: Insufficient documentation

## 2013-07-04 MED ORDER — IBUPROFEN 400 MG PO TABS: 400.0000 mg | ORAL_TABLET | Freq: Four times a day (QID) | ORAL | Status: DC | PRN

## 2013-07-04 NOTE — ED Notes (Signed)
Pt reports L anterior thigh pain since yesterday.  Denies any injury.  Pt is ambulatory.  Pt has hx of claudication and DVT in the past.  Pt also reports mild swelling to her thigh.

## 2013-07-04 NOTE — ED Provider Notes (Signed)
CSN: [id number]     Arrival date & time 07/04/13  1613 History     First MD Initiated Contact with Patient 07/04/13 1707     Chief Complaint  Patient presents with  . Leg Pain  . Leg Swelling   (Consider location/radiation/quality/duration/timing/severity/associated sxs/prior Treatment) HPI Comments: Pt comes in with cc of leg swelling and pain to the left side. Has hx of PVAD and DVT, not on coumadin. Started having left thigh pain and swelling yday. The pain is worse with palpation in that area. No claudication like sx related to the current pain. There is no n/v/f/c/trauma.  Patient is a 67 y.o. female presenting with leg pain. The history is provided by the patient.  Leg Pain Associated symptoms: no neck pain     Past Medical History  Diagnosis Date  . Stricture and stenosis of esophagus   . Other and unspecified hyperlipidemia   . Dysthymic disorder   . Other and unspecified coagulation defects   . Malignant neoplasm of breast (female), unspecified site   . Diaphragmatic hernia without mention of obstruction or gangrene   . PAD (peripheral artery disease)   . Colitis, ulcerative     Left sided  . PVD (peripheral vascular disease)    Past Surgical History  Procedure Laterality Date  . Cholecystectomy    . Appendectomy    . Tubal ligation    . Breast lumpectomy      Right   Family History  Problem Relation Age of Onset  . Heart disease Father   . Colon cancer Neg Hx   . Lung cancer Maternal Grandfather    History  Substance Use Topics  . Smoking status: Former Smoker    Quit date: 12/08/2002  . Smokeless tobacco: Never Used  . Alcohol Use: 7.0 oz/week    14 drink(s) per week     Comment: 2 drinks per night   OB History   Grav Para Term Preterm Abortions TAB SAB Ect Mult Living                 Review of Systems  Constitutional: Negative for activity change.  HENT: Negative for neck pain.   Respiratory: Negative for shortness of breath.    Cardiovascular: Negative for chest pain.  Gastrointestinal: Negative for nausea, vomiting and abdominal pain.  Genitourinary: Negative for dysuria.  Neurological: Negative for headaches.    Allergies  Ciprofloxacin  Home Medications   Current Outpatient Rx  Name  Route  Sig  Dispense  Refill  . aspirin 81 MG EC tablet   Oral   Take 81 mg by mouth every morning.          [name] omeprazole (PRILOSEC) 20 MG capsule   Oral   Take 20 mg by mouth every morning.          . ondansetron (ZOFRAN) 4 MG tablet   Oral   Take 1 tablet (4 mg total) by mouth every 8 (eight) hours as needed for nausea.   30 tablet   1   . ALPRAZolam (XANAX) 0.25 MG tablet   Oral   Take 0.25 mg by mouth at bedtime as needed. For anxiety         . ibuprofen (ADVIL) 200 MG tablet   Oral   Take 400 mg by mouth every 6 (six) hours as needed. For headache pain         . Multiple Vitamin (MULTIVITAMIN WITH MINERALS) TABS   Oral   Take 1  tablet by mouth daily.         . sertraline (ZOLOFT) 100 MG tablet   Oral   Take 100 mg by mouth every morning.           BP 159/80  Pulse 91  Temp(Src) 98.3 F (36.8 C) (Oral)  Resp 18  SpO2 96% Physical Exam  Nursing note and vitals reviewed. Constitutional: She is oriented to person, place, and time. She appears well-developed and well-nourished.  HENT:  Head: Normocephalic and atraumatic.  Eyes: EOM are normal. Pupils are equal, round, and reactive to light.  Neck: Neck supple.  Cardiovascular: Normal rate, regular rhythm and normal heart sounds.   No murmur heard. Pulmonary/Chest: Effort normal. No respiratory distress.  Abdominal: Soft. She exhibits no distension. There is no tenderness. There is no rebound and no guarding.  Musculoskeletal:  Left thigh is slightly edematous, and there is mild erythema over the vasculature.  Neurological: She is alert and oriented to person, place, and time.  Skin: Skin is warm and dry.    ED Course    Procedures (including critical care time)  Labs Reviewed - No data to display No results found. No diagnosis found.  MDM  Pt comes in with leg swelling. Hx of DVT. Concerns for DVT, and possible thrombophlebitis.    Derwood Kaplan, MD 07/04/13 1745

## 2013-07-04 NOTE — Progress Notes (Signed)
VASCULAR LAB PRELIMINARY  PRELIMINARY  PRELIMINARY  PRELIMINARY  Left lower extremity venous Doppler completed.    Preliminary report:  There is no DVT noted in the left lower extremity.  There is superficial thrombophlebitis noted in the greater saphenous vein from proximal/mid to distal thigh.  Theresa [name], RVT 07/04/2013, 7:00 PM

## 2013-08-31 ENCOUNTER — Telehealth: Payer: Self-pay

## 2013-08-31 NOTE — Telephone Encounter (Signed)
Phone call from pt.  States she has bilateral swelling in her feet, ankles up to calf.    States has traveled recently to [place], and did a lot of walking while there.  Noticed the swelling following the increase in walking.  Denies any local  redness, warmth, or tenderness of lower extremities.  C/o aching in her legs.  States she started wearing compression stockings, she purchased at [employer]; reports the swelling is slightly improved in the mornings.  Pt. is presently in [place], and not due to return to [place] until 09/06/13.  Discussed pt's symptoms with Dr. Edilia Bo.  Advised pt. should seek out medical evaluation to rule out DVT.  Notified pt. Per phone of Dr. Adele Dan recommendation.  Verb. Understanding.

## 2013-10-05 ENCOUNTER — Other Ambulatory Visit: Payer: Self-pay | Admitting: *Deleted

## 2013-10-05 DIAGNOSIS — R600 Localized edema: Secondary | ICD-10-CM

## 2013-10-25 ENCOUNTER — Encounter: Payer: Self-pay | Admitting: Vascular Surgery

## 2013-10-26 ENCOUNTER — Ambulatory Visit (HOSPITAL_COMMUNITY)
Admission: RE | Admit: 2013-10-26 | Discharge: 2013-10-26 | Disposition: A | Discharge status: Home / Self Care | Payer: Medicare Other | Source: Ambulatory Visit | Attending: Vascular Surgery | Admitting: Vascular Surgery

## 2013-10-26 ENCOUNTER — Encounter: Payer: Self-pay | Admitting: Vascular Surgery

## 2013-10-26 ENCOUNTER — Ambulatory Visit (INDEPENDENT_AMBULATORY_CARE_PROVIDER_SITE_OTHER): Payer: Medicare Other | Admitting: Vascular Surgery

## 2013-10-26 ENCOUNTER — Encounter (INDEPENDENT_AMBULATORY_CARE_PROVIDER_SITE_OTHER): Payer: Self-pay

## 2013-10-26 VITALS — BP 126/80 | HR 80 | Ht 63.0 in | Wt 160.9 lb

## 2013-10-26 DIAGNOSIS — I872 Venous insufficiency (chronic) (peripheral): Secondary | ICD-10-CM

## 2013-10-26 DIAGNOSIS — R609 Edema, unspecified: Secondary | ICD-10-CM

## 2013-10-26 DIAGNOSIS — R600 Localized edema: Secondary | ICD-10-CM

## 2013-10-26 DIAGNOSIS — I70219 Atherosclerosis of native arteries of extremities with intermittent claudication, unspecified extremity: Secondary | ICD-10-CM

## 2013-10-26 NOTE — Assessment & Plan Note (Signed)
Based on her duplex findings, she has evidence of chronic venous insufficiency bilaterally. We have discussed the importance of intermittent leg elevation and the proper positioning for this. She has not do well with compression stockings. I have also encouraged her to stay as active as possible and to consider water aerobics which is also very helpful in patients with chronic venous insufficiency. She knows to avoid prolonged sitting and standing. Although she does have incompetence of the greater saphenous veins bilaterally her symptoms are not significant and therefore at this point I would not consider her for laser ablation of the greater saphenous veins.

## 2013-10-26 NOTE — Assessment & Plan Note (Signed)
She has stable bilateral lower extremity claudication. Based on her exam she has infrainguinal arterial occlusive disease bilaterally. She understands we would not consider arteriography and further intervention unless her symptoms became disabling or she developed rest pain or nonhealing ulcer. Fortunately she is not a smoker. I've encouraged her to stay as active as possible.

## 2013-10-26 NOTE — Progress Notes (Signed)
[facility] [place]  Patient name: Theresa [name] MRN: [id number] DOB: 08-05-1946 Sex: female  REASON FOR VISIT: bilateral lower extremity edema for 3 months.  HPI: Theresa [name] is a 67 y.o. female who I last saw on 05/18/2013. I have been following her with peripheral vascular disease. She had pain in both lower extremities which was brought on by ambulation and relieved with rest. Her symptoms had been stable. She denied any rest pain or nonhealing ulcers. I did not recommend arteriography at that time. Discussed the importance of a structured walking program. She was not a smoker. She was on aspirin.  She was scheduled to be seen back in June of 2015. However over the last 3 months she's had increasing problems with swelling in both lower extremities which is more significant on the left side. She states the remote past she did have an issue with a DVT in phlebitis but cannot remember which leg or any specific details except that she was on Coumadin for 1 year. Her swelling is aggravated by sitting and standing and alleviated somewhat with elevation. She has tried to wear compression stockings but has a very difficult time getting them on and off.  Her claudication symptoms have not changed. She experiences pain in both calves associated with ambulation and relieved with rest. She denies any history of rest pain or nonhealing ulcers.  Past Medical History  Diagnosis Date  . Stricture and stenosis of esophagus   . Other and unspecified hyperlipidemia   . Dysthymic disorder   . Other and unspecified coagulation defects   . Malignant neoplasm of breast (female), unspecified site   . Diaphragmatic hernia without mention of obstruction or gangrene   . PAD (peripheral artery disease)   . Colitis, ulcerative     Left sided  . PVD (peripheral vascular disease)    Family History  Problem Relation Age of Onset  . Heart disease Father   . Heart attack  Father   . Colon cancer Neg Hx   . Lung cancer Maternal Grandfather   . Deep vein thrombosis Daughter    SOCIAL HISTORY: History  Substance Use Topics  . Smoking status: Former Smoker    Quit date: 12/08/2002  . Smokeless tobacco: Never Used  . Alcohol Use: 7.0 oz/week    14 drink(s) per week     Comment: 2 drinks per night   Allergies  Allergen Reactions  . Ciprofloxacin Hives   Current Outpatient Prescriptions  Medication Sig Dispense Refill  . ALPRAZolam (XANAX) 0.25 MG tablet Take 0.25 mg by mouth every 8 (eight) hours as needed for anxiety.       [name] aspirin 81 MG EC tablet Take 81 mg by mouth every morning.       [name] ibuprofen (ADVIL) 200 MG tablet Take 200 mg by mouth every 6 (six) hours as needed. For headache pain      . ibuprofen (ADVIL,MOTRIN) 400 MG tablet Take 1 tablet (400 mg total) by mouth every 6 (six) hours as needed for pain.  30 tablet  0  . mirtazapine (REMERON) 15 MG tablet Take 15 mg by mouth at bedtime.      . Multiple Vitamins-Minerals (HM MULTIVITAMIN ADULT GUMMY) CHEW Chew 1 tablet by mouth every morning.       [name] omeprazole (PRILOSEC) 20 MG capsule Take 20 mg by mouth every morning.       . ondansetron (ZOFRAN) 4 MG tablet Take 1 tablet (4  mg total) by mouth every 8 (eight) hours as needed for nausea.  30 tablet  1  . [DISCONTINUED] lansoprazole (PREVACID) 30 MG capsule Take 30 mg by mouth daily.        . [DISCONTINUED] metoCLOPramide (REGLAN) 10 MG tablet Take one tab one half hour before meals and at bedtime  40 tablet  3   No current facility-administered medications for this visit.   REVIEW OF SYSTEMS: Arly.Keller ] denotes positive finding; [  ] denotes negative finding  CARDIOVASCULAR:  [ ]  chest pain   [ ]  chest pressure   [ ]  palpitations   [ ]  orthopnea   [ ]  dyspnea on exertion   Arly.Keller ] claudication   [ ]  rest pain   Arly.Keller ] DVT   Arly.Keller ] phlebitis PULMONARY:   [ ]  productive cough   [ ]  asthma   [ ]  wheezing NEUROLOGIC:   [ ]  weakness  [ ]  paresthesias  [ ]   aphasia  [ ]  amaurosis  [ ]  dizziness HEMATOLOGIC:   [ ]  bleeding problems   [ ]  clotting disorders MUSCULOSKELETAL:  [ ]  joint pain   [ ]  joint swelling Arly.Keller ] leg swelling GASTROINTESTINAL: [ ]   blood in stool  [ ]   hematemesis GENITOURINARY:  [ ]   dysuria  [ ]   hematuria PSYCHIATRIC:  [ ]  history of major depression INTEGUMENTARY:  [ ]  rashes  [ ]  ulcers CONSTITUTIONAL:  [ ]  fever   [ ]  chills  PHYSICAL EXAM: Filed Vitals:   10/26/13 1518  BP: 126/80  Pulse: 80  Height: 5\' 3"  (1.6 m)  Weight: 160 lb 14.4 oz (72.984 kg)  SpO2: 99%   Body mass index is 28.51 kg/(m^2). GENERAL: The patient is a well-nourished female, in no acute distress. The vital signs are documented above. CARDIOVASCULAR: There is a regular rate and rhythm. I do not detect carotid bruits. She has palpable femoral pulses. I cannot palpate popliteal or pedal pulses. PULMONARY: There is good air exchange bilaterally without wheezing or rales. ABDOMEN: Soft and non-tender with normal pitched bowel sounds.  MUSCULOSKELETAL: There are no major deformities or cyanosis. NEUROLOGIC: No focal weakness or paresthesias are detected. SKIN: There are no ulcers or rashes noted. She does have some varicose veins in the posterior aspect of both calves. I do not see any evidence of phlebitis. PSYCHIATRIC: The patient has a normal affect.  DATA:  I have independently interpreted her venous duplex scan today. There is no evidence of DVT in either lower extremity. She does have reflux in both saphenofemoral junctions and proximal greater saphenous veins. There is an area of superficial thrombophlebitis in the left greater saphenous vein however this is likely old.  MEDICAL ISSUES:  Atherosclerosis of native arteries of the extremities with intermittent claudication She has stable bilateral lower extremity claudication. Based on her exam she has infrainguinal arterial occlusive disease bilaterally. She understands we would not consider  arteriography and further intervention unless her symptoms became disabling or she developed rest pain or nonhealing ulcer. Fortunately she is not a smoker. I've encouraged her to stay as active as possible.  Chronic venous insufficiency Based on her duplex findings, she has evidence of chronic venous insufficiency bilaterally. We have discussed the importance of intermittent leg elevation and the proper positioning for this. She has not do well with compression stockings. I have also encouraged her to stay as active as possible and to consider water aerobics which is also very helpful in patients with  chronic venous insufficiency. She knows to avoid prolonged sitting and standing. Although she does have incompetence of the greater saphenous veins bilaterally her symptoms are not significant and therefore at this point I would not consider her for laser ablation of the greater saphenous veins.   DICKSON,CHRISTOPHER S [facility] [place] Beeper: [phone]

## 2013-11-02 NOTE — Addendum Note (Signed)
Addended by: Melodye Ped C on: 11/02/2013 11:35 AM   Modules accepted: Orders

## 2014-01-10 ENCOUNTER — Telehealth: Payer: Self-pay | Admitting: Internal Medicine

## 2014-01-10 NOTE — Telephone Encounter (Signed)
s.w pt and advised on March 25 time change pt ok and aware

## 2014-02-27 ENCOUNTER — Ambulatory Visit (HOSPITAL_COMMUNITY)
Admission: RE | Admit: 2014-02-27 | Discharge: 2014-02-27 | Disposition: A | Discharge status: Home / Self Care | Payer: Medicare Other | Source: Ambulatory Visit | Attending: Internal Medicine | Admitting: Internal Medicine

## 2014-02-27 ENCOUNTER — Other Ambulatory Visit: Payer: Self-pay | Admitting: Internal Medicine

## 2014-02-27 ENCOUNTER — Other Ambulatory Visit (HOSPITAL_BASED_OUTPATIENT_CLINIC_OR_DEPARTMENT_OTHER): Payer: Medicare Other

## 2014-02-27 DIAGNOSIS — Z853 Personal history of malignant neoplasm of breast: Secondary | ICD-10-CM

## 2014-02-27 DIAGNOSIS — C50919 Malignant neoplasm of unspecified site of unspecified female breast: Secondary | ICD-10-CM

## 2014-02-27 DIAGNOSIS — Z87891 Personal history of nicotine dependence: Secondary | ICD-10-CM | POA: Insufficient documentation

## 2014-02-27 DIAGNOSIS — R0789 Other chest pain: Secondary | ICD-10-CM | POA: Insufficient documentation

## 2014-02-27 DIAGNOSIS — Z923 Personal history of irradiation: Secondary | ICD-10-CM | POA: Insufficient documentation

## 2014-02-27 LAB — COMPREHENSIVE METABOLIC PANEL (CC13)
ALBUMIN: 3.6 g/dL (ref 3.5–5.0)
ALK PHOS: 87 U/L (ref 40–150)
ALT: 34 U/L (ref 0–55)
AST: 44 U/L — AB (ref 5–34)
Anion Gap: 14 mEq/L — ABNORMAL HIGH (ref 3–11)
BUN: 13.2 mg/dL (ref 7.0–26.0)
CALCIUM: 9.8 mg/dL (ref 8.4–10.4)
CHLORIDE: 106 meq/L (ref 98–109)
CO2: 26 mEq/L (ref 22–29)
Creatinine: 0.9 mg/dL (ref 0.6–1.1)
Glucose: 112 mg/dl (ref 70–140)
POTASSIUM: 3.8 meq/L (ref 3.5–5.1)
SODIUM: 146 meq/L — AB (ref 136–145)
TOTAL PROTEIN: 7.3 g/dL (ref 6.4–8.3)
Total Bilirubin: 0.53 mg/dL (ref 0.20–1.20)

## 2014-02-27 LAB — CBC WITH DIFFERENTIAL/PLATELET
BASO%: 0.4 % (ref 0.0–2.0)
Basophils Absolute: 0 10*3/uL (ref 0.0–0.1)
EOS%: 1.6 % (ref 0.0–7.0)
Eosinophils Absolute: 0.2 10*3/uL (ref 0.0–0.5)
HEMATOCRIT: 44.1 % (ref 34.8–46.6)
HGB: 14.6 g/dL (ref 11.6–15.9)
LYMPH#: 2.5 10*3/uL (ref 0.9–3.3)
LYMPH%: 26.7 % (ref 14.0–49.7)
MCH: 31.7 pg (ref 25.1–34.0)
MCHC: 33.1 g/dL (ref 31.5–36.0)
MCV: 95.7 fL (ref 79.5–101.0)
MONO#: 0.7 10*3/uL (ref 0.1–0.9)
MONO%: 7.3 % (ref 0.0–14.0)
NEUT#: 6.1 10*3/uL (ref 1.5–6.5)
NEUT%: 64 % (ref 38.4–76.8)
Platelets: 239 10*3/uL (ref 145–400)
RBC: 4.61 10*6/uL (ref 3.70–5.45)
RDW: 13.5 % (ref 11.2–14.5)
WBC: 9.5 10*3/uL (ref 3.9–10.3)

## 2014-02-28 ENCOUNTER — Ambulatory Visit (HOSPITAL_COMMUNITY): Payer: Medicare Other

## 2014-02-28 LAB — CANCER ANTIGEN 27.29: CA 27.29: 36 U/mL (ref 0–39)

## 2014-03-01 ENCOUNTER — Ambulatory Visit (HOSPITAL_BASED_OUTPATIENT_CLINIC_OR_DEPARTMENT_OTHER): Payer: Medicare Other | Admitting: Internal Medicine

## 2014-03-01 ENCOUNTER — Encounter: Payer: Self-pay | Admitting: Internal Medicine

## 2014-03-01 ENCOUNTER — Telehealth: Payer: Self-pay | Admitting: Internal Medicine

## 2014-03-01 ENCOUNTER — Ambulatory Visit: Payer: Medicare Other | Admitting: Internal Medicine

## 2014-03-01 VITALS — BP 160/89 | HR 86 | Temp 98.1°F | Resp 18 | Ht 63.0 in | Wt 167.2 lb

## 2014-03-01 DIAGNOSIS — C50919 Malignant neoplasm of unspecified site of unspecified female breast: Secondary | ICD-10-CM

## 2014-03-01 DIAGNOSIS — Z853 Personal history of malignant neoplasm of breast: Secondary | ICD-10-CM

## 2014-03-01 NOTE — Telephone Encounter (Signed)
, °

## 2014-03-01 NOTE — Progress Notes (Signed)
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   PRIOR THERAPY:  1. Status post right breast lumpectomy with sentinel lymph node biopsies on January 08, 2001, revealing a 1.2 cm infiltrating ductal carcinoma. The tumor was positive for estrogen and progesterone receptors and negative for HER-2/neu.  2. Status post postoperative radiotherapy to the right breast.  3. Arimidex 1 mg p.o. daily, started May 2002, completed 2012.  CURRENT THERAPY: Observation.   INTERVAL HISTORY: Theresa [name] 68 y.o. female returns to the clinic today for annual followup visit. She suffered a broken tooth during endoscopy procedure at [facility] recently. She is very upset about it. The patient is doing fine today with no specific complaints. She denied having any chest pain, shortness breath, cough or hemoptysis. She denied having any weight loss or night sweats. She has no palpable lymphadenopathy. The patient had a mammogram performed in and give her 2014 and it showed no evidence for disease recurrence. She had repeat CBC, comprehensive metabolic panel, CA 22.02 as well as chest x-ray performed recently and she is here for evaluation and discussion of her lab and imaging results.  MEDICAL HISTORY: Past Medical History  Diagnosis Date  . Stricture and stenosis of esophagus   . Other and unspecified hyperlipidemia   . Dysthymic disorder   . Other and unspecified coagulation defects   . Malignant neoplasm of breast (female), unspecified site   . Diaphragmatic hernia without mention of obstruction or gangrene   . PAD (peripheral artery disease)   . Colitis, ulcerative     Left sided  . PVD (peripheral vascular disease)     ALLERGIES:  is allergic to  ciprofloxacin.  MEDICATIONS:  Current Outpatient Prescriptions  Medication Sig Dispense Refill  . ALPRAZolam (XANAX) 0.25 MG tablet Take 0.25 mg by mouth every 8 (eight) hours as needed for anxiety.       [name] aspirin 81 MG EC tablet Take 81 mg by mouth every morning.       [name] ibuprofen (ADVIL) 200 MG tablet Take 200 mg by mouth every 6 (six) hours as needed. For headache pain      . ibuprofen (ADVIL,MOTRIN) 400 MG tablet Take 1 tablet (400 mg total) by mouth every 6 (six) hours as needed for pain.  30 tablet  0  . mirtazapine (REMERON) 15 MG tablet Take 30 mg by mouth at bedtime.       . Multiple Vitamins-Minerals (HM MULTIVITAMIN ADULT GUMMY) CHEW Chew 1 tablet by mouth every morning.       [name] omeprazole (PRILOSEC) 20 MG capsule Take 20 mg by mouth every morning.       . ondansetron (ZOFRAN) 4 MG tablet Take 1 tablet (4 mg total) by mouth every 8 (eight) hours as needed for nausea.  30 tablet  1  . [DISCONTINUED] lansoprazole (PREVACID) 30 MG capsule Take 30 mg by mouth daily.        . [DISCONTINUED] metoCLOPramide (REGLAN) 10 MG tablet Take one tab one half hour before meals and at bedtime  40 tablet  3   No current facility-administered medications for this visit.    SURGICAL HISTORY:  Past Surgical History  Procedure Laterality Date  . Cholecystectomy    . Appendectomy    . Tubal ligation    .  Breast lumpectomy      Right    REVIEW OF SYSTEMS:  A comprehensive review of systems was negative.   PHYSICAL EXAMINATION: General appearance: alert, cooperative and no distress Head: Normocephalic, without obvious abnormality, atraumatic Lymph nodes: Cervical, supraclavicular, and axillary nodes normal. Resp: clear to auscultation bilaterally Cardio: regular rate and rhythm, S1, S2 normal, no murmur, click, rub or gallop GI: soft, non-tender; bowel sounds normal; no masses,  no organomegaly Extremities: extremities normal, atraumatic, no cyanosis or edema Breast exam. Done with the  chaperone of my nurse and showed no palpable masses in the breasts bilaterally and no palpable axillary lymphadenopathy bilaterally.  ECOG PERFORMANCE STATUS: 0 - Asymptomatic  Blood pressure 160/89, pulse 86, temperature 98.1 F (36.7 C), temperature source Oral, resp. rate 18, height $RemoveBe'5\' 3"'FcTmAgFPy$  (1.6 m), weight 167 lb 3.2 oz (75.841 kg), SpO2 95.00%.  LABORATORY DATA: Lab Results  Component Value Date   WBC 9.5 02/27/2014   HGB 14.6 02/27/2014   HCT 44.1 02/27/2014   MCV 95.7 02/27/2014   PLT 239 02/27/2014      Chemistry      Component Value Date/Time   NA 146* 02/27/2014 1341   NA 138 06/22/2012 0345   K 3.8 02/27/2014 1341   K 3.6 06/22/2012 0345   CL 104 03/02/2013 1531   CL 110 06/22/2012 0345   CO2 26 02/27/2014 1341   CO2 19 06/22/2012 0345   BUN 13.2 02/27/2014 1341   BUN 7 06/22/2012 0345   CREATININE 0.9 02/27/2014 1341   CREATININE 0.70 06/22/2012 0345      Component Value Date/Time   CALCIUM 9.8 02/27/2014 1341   CALCIUM 9.2 06/22/2012 0345   ALKPHOS 87 02/27/2014 1341   ALKPHOS 76 02/10/2012 1409   AST 44* 02/27/2014 1341   AST 33 02/10/2012 1409   ALT 34 02/27/2014 1341   ALT 15 02/10/2012 1409   BILITOT 0.53 02/27/2014 1341   BILITOT 0.7 02/10/2012 1409       RADIOGRAPHIC STUDIES: Dg Chest 2 View  03/02/2013  *RADIOLOGY REPORT*  Clinical Data: Malignant neoplasm of the breast  CHEST - 2 VIEW  Comparison: 08/13/2011  Findings: Heart size is normal.  There is no pleural effusion or edema identified.  No airspace consolidation identified. Cholecystectomy clips noted within the right upper quadrant of the abdomen. The visualized osseous structures appear unremarkable.  IMPRESSION:  1.  No acute cardiopulmonary abnormalities.   Original Report Authenticated By: Kerby Moors, M.D.     ASSESSMENT AND PLAN: This is a very pleasant 68 years old white female with history of stage I right breast infiltrating ductal carcinoma status post surgical resection followed by postoperative radiotherapy  and completion of 10 years of hormonal therapy with Arimidex.  The patient is currently on observation with no evidence for disease recurrence on the recent blood or imaging studies. I discussed the lab and imaging results with the patient today.   I recommended for her to continue on observation with repeat blood work and chest x-ray in one year.  All questions were answered. The patient knows to call the clinic with any problems, questions or concerns. We can certainly see the patient much sooner if necessary.  Disclaimer: This note was dictated with voice recognition software. Similar sounding words can inadvertently be transcribed and may not be corrected upon review.

## 2014-03-03 ENCOUNTER — Other Ambulatory Visit: Payer: Self-pay

## 2014-05-17 ENCOUNTER — Encounter (HOSPITAL_COMMUNITY): Payer: Medicare Other

## 2014-05-17 ENCOUNTER — Ambulatory Visit: Payer: Medicare Other | Admitting: Vascular Surgery

## 2014-06-07 DIAGNOSIS — I2699 Other pulmonary embolism without acute cor pulmonale: Secondary | ICD-10-CM

## 2014-06-07 DIAGNOSIS — I82409 Acute embolism and thrombosis of unspecified deep veins of unspecified lower extremity: Secondary | ICD-10-CM

## 2014-06-07 HISTORY — DX: Acute embolism and thrombosis of unspecified deep veins of unspecified lower extremity: I82.409

## 2014-06-07 HISTORY — DX: Other pulmonary embolism without acute cor pulmonale: I26.99

## 2014-06-12 ENCOUNTER — Other Ambulatory Visit: Payer: Self-pay | Admitting: Family Medicine

## 2014-06-12 DIAGNOSIS — R600 Localized edema: Secondary | ICD-10-CM

## 2014-06-15 ENCOUNTER — Encounter (HOSPITAL_COMMUNITY): Payer: Self-pay | Admitting: Emergency Medicine

## 2014-06-15 ENCOUNTER — Emergency Department (HOSPITAL_COMMUNITY): Payer: Medicare Other

## 2014-06-15 ENCOUNTER — Inpatient Hospital Stay (HOSPITAL_COMMUNITY)
Admission: EM | Admit: 2014-06-15 | Discharge: 2014-06-17 | DRG: 175 | Disposition: A | Discharge status: Home Health | Payer: Medicare Other | Attending: Internal Medicine | Admitting: Internal Medicine

## 2014-06-15 DIAGNOSIS — R7989 Other specified abnormal findings of blood chemistry: Secondary | ICD-10-CM

## 2014-06-15 DIAGNOSIS — K5289 Other specified noninfective gastroenteritis and colitis: Secondary | ICD-10-CM

## 2014-06-15 DIAGNOSIS — Z801 Family history of malignant neoplasm of trachea, bronchus and lung: Secondary | ICD-10-CM

## 2014-06-15 DIAGNOSIS — Z7901 Long term (current) use of anticoagulants: Secondary | ICD-10-CM

## 2014-06-15 DIAGNOSIS — J9600 Acute respiratory failure, unspecified whether with hypoxia or hypercapnia: Secondary | ICD-10-CM

## 2014-06-15 DIAGNOSIS — Z87891 Personal history of nicotine dependence: Secondary | ICD-10-CM

## 2014-06-15 DIAGNOSIS — E8809 Other disorders of plasma-protein metabolism, not elsewhere classified: Secondary | ICD-10-CM | POA: Diagnosis present

## 2014-06-15 DIAGNOSIS — D72829 Elevated white blood cell count, unspecified: Secondary | ICD-10-CM

## 2014-06-15 DIAGNOSIS — F341 Dysthymic disorder: Secondary | ICD-10-CM

## 2014-06-15 DIAGNOSIS — J9611 Chronic respiratory failure with hypoxia: Secondary | ICD-10-CM

## 2014-06-15 DIAGNOSIS — I2699 Other pulmonary embolism without acute cor pulmonale: Principal | ICD-10-CM

## 2014-06-15 DIAGNOSIS — C50919 Malignant neoplasm of unspecified site of unspecified female breast: Secondary | ICD-10-CM

## 2014-06-15 DIAGNOSIS — Z9089 Acquired absence of other organs: Secondary | ICD-10-CM

## 2014-06-15 DIAGNOSIS — Z8249 Family history of ischemic heart disease and other diseases of the circulatory system: Secondary | ICD-10-CM

## 2014-06-15 DIAGNOSIS — A419 Sepsis, unspecified organism: Secondary | ICD-10-CM

## 2014-06-15 DIAGNOSIS — K219 Gastro-esophageal reflux disease without esophagitis: Secondary | ICD-10-CM

## 2014-06-15 DIAGNOSIS — Z86718 Personal history of other venous thrombosis and embolism: Secondary | ICD-10-CM

## 2014-06-15 DIAGNOSIS — Z881 Allergy status to other antibiotic agents status: Secondary | ICD-10-CM

## 2014-06-15 DIAGNOSIS — Z853 Personal history of malignant neoplasm of breast: Secondary | ICD-10-CM

## 2014-06-15 DIAGNOSIS — J9610 Chronic respiratory failure, unspecified whether with hypoxia or hypercapnia: Secondary | ICD-10-CM

## 2014-06-15 DIAGNOSIS — J9620 Acute and chronic respiratory failure, unspecified whether with hypoxia or hypercapnia: Secondary | ICD-10-CM | POA: Diagnosis present

## 2014-06-15 DIAGNOSIS — I70219 Atherosclerosis of native arteries of extremities with intermittent claudication, unspecified extremity: Secondary | ICD-10-CM

## 2014-06-15 DIAGNOSIS — Z79899 Other long term (current) drug therapy: Secondary | ICD-10-CM

## 2014-06-15 DIAGNOSIS — Z7982 Long term (current) use of aspirin: Secondary | ICD-10-CM

## 2014-06-15 DIAGNOSIS — J9601 Acute respiratory failure with hypoxia: Secondary | ICD-10-CM

## 2014-06-15 DIAGNOSIS — K222 Esophageal obstruction: Secondary | ICD-10-CM

## 2014-06-15 DIAGNOSIS — E785 Hyperlipidemia, unspecified: Secondary | ICD-10-CM

## 2014-06-15 DIAGNOSIS — I872 Venous insufficiency (chronic) (peripheral): Secondary | ICD-10-CM

## 2014-06-15 DIAGNOSIS — E876 Hypokalemia: Secondary | ICD-10-CM | POA: Diagnosis present

## 2014-06-15 DIAGNOSIS — Z9851 Tubal ligation status: Secondary | ICD-10-CM

## 2014-06-15 DIAGNOSIS — I739 Peripheral vascular disease, unspecified: Secondary | ICD-10-CM | POA: Diagnosis present

## 2014-06-15 LAB — I-STAT TROPONIN, ED: TROPONIN I, POC: 0.01 ng/mL (ref 0.00–0.08)

## 2014-06-15 LAB — COMPREHENSIVE METABOLIC PANEL
ALBUMIN: 2.9 g/dL — AB (ref 3.5–5.2)
ALT: 21 U/L (ref 0–35)
ANION GAP: 15 (ref 5–15)
AST: 32 U/L (ref 0–37)
Alkaline Phosphatase: 102 U/L (ref 39–117)
BILIRUBIN TOTAL: 0.5 mg/dL (ref 0.3–1.2)
BUN: 8 mg/dL (ref 6–23)
CHLORIDE: 106 meq/L (ref 96–112)
CO2: 25 meq/L (ref 19–32)
CREATININE: 0.73 mg/dL (ref 0.50–1.10)
Calcium: 8.1 mg/dL — ABNORMAL LOW (ref 8.4–10.5)
GFR calc Af Amer: >90 mL/min (ref >90)
GFR, EST NON AFRICAN AMERICAN: 86 mL/min — AB (ref >90)
Glucose, Bld: 113 mg/dL — ABNORMAL HIGH (ref 70–99)
Potassium: 3.8 mEq/L (ref 3.7–5.3)
Sodium: 146 mEq/L (ref 137–147)
Total Protein: 6.5 g/dL (ref 6.0–8.3)

## 2014-06-15 LAB — HEPARIN LEVEL (UNFRACTIONATED): Heparin Unfractionated: >2.2 IU/mL — ABNORMAL HIGH (ref 0.30–0.70)

## 2014-06-15 LAB — CBC
HCT: 40.7 % (ref 36.0–46.0)
Hemoglobin: 13.5 g/dL (ref 12.0–15.0)
MCH: 32 pg (ref 26.0–34.0)
MCHC: 33.2 g/dL (ref 30.0–36.0)
MCV: 96.4 fL (ref 78.0–100.0)
PLATELETS: 215 10*3/uL (ref 150–400)
RBC: 4.22 MIL/uL (ref 3.87–5.11)
RDW: 15.8 % — AB (ref 11.5–15.5)
WBC: 8.4 10*3/uL (ref 4.0–10.5)

## 2014-06-15 LAB — APTT: aPTT: 38 seconds — ABNORMAL HIGH (ref 24–37)

## 2014-06-15 LAB — PRO B NATRIURETIC PEPTIDE: Pro B Natriuretic peptide (BNP): 306.1 pg/mL — ABNORMAL HIGH (ref 0–125)

## 2014-06-15 LAB — LACTIC ACID, PLASMA: Lactic Acid, Venous: 2.2 mmol/L (ref 0.5–2.2)

## 2014-06-15 LAB — ANTITHROMBIN III: ANTITHROMB III FUNC: 50 % — AB (ref 75–120)

## 2014-06-15 MED ORDER — ONDANSETRON HCL 4 MG/2ML IJ SOLN: 4.0000 mg | Freq: Four times a day (QID) | INTRAMUSCULAR | Status: DC | PRN

## 2014-06-15 MED ORDER — ESCITALOPRAM OXALATE 5 MG PO TABS
5.0000 mg | ORAL_TABLET | Freq: Every day | ORAL | Status: DC
  Administered 2014-06-15 – 2014-06-17 (×3): 5 mg via ORAL
  Filled 2014-06-15 (×3): qty 1

## 2014-06-15 MED ORDER — PANTOPRAZOLE SODIUM 40 MG PO TBEC
80.0000 mg | DELAYED_RELEASE_TABLET | Freq: Every day | ORAL | Status: DC
Start: 2014-06-16 — End: 2014-06-17
  Administered 2014-06-16 – 2014-06-17 (×2): 80 mg via ORAL
  Filled 2014-06-15 (×2): qty 2

## 2014-06-15 MED ORDER — ACETAMINOPHEN 650 MG RE SUPP: 650.0000 mg | Freq: Four times a day (QID) | RECTAL | Status: DC | PRN

## 2014-06-15 MED ORDER — ACETAMINOPHEN 325 MG PO TABS: 650.0000 mg | ORAL_TABLET | Freq: Four times a day (QID) | ORAL | Status: DC | PRN

## 2014-06-15 MED ORDER — ONDANSETRON HCL 4 MG PO TABS
4.0000 mg | ORAL_TABLET | Freq: Four times a day (QID) | ORAL | Status: DC | PRN
Start: 2014-06-15 — End: 2014-06-17

## 2014-06-15 MED ORDER — HEPARIN (PORCINE) IN NACL 100-0.45 UNIT/ML-% IJ SOLN
1200.0000 [IU]/h | INTRAMUSCULAR | Status: DC
  Administered 2014-06-15: 1000 [IU]/h via INTRAVENOUS
  Administered 2014-06-16 (×2): 1300 [IU]/h via INTRAVENOUS
  Filled 2014-06-15 (×4): qty 250

## 2014-06-15 MED ORDER — MIRTAZAPINE 30 MG PO TABS
30.0000 mg | ORAL_TABLET | Freq: Every day | ORAL | Status: DC
  Administered 2014-06-15 – 2014-06-16 (×2): 30 mg via ORAL
  Filled 2014-06-15 (×3): qty 1

## 2014-06-15 MED ORDER — SODIUM CHLORIDE 0.9 % IJ SOLN
3.0000 mL | Freq: Two times a day (BID) | INTRAMUSCULAR | Status: DC
  Administered 2014-06-15: 3 mL via INTRAVENOUS

## 2014-06-15 MED ORDER — IOHEXOL 350 MG/ML SOLN
100.0000 mL | Freq: Once | INTRAVENOUS | Status: AC | PRN
  Administered 2014-06-15: 100 mL via INTRAVENOUS

## 2014-06-15 NOTE — ED Notes (Signed)
PT returned from CT. Pt alert and interactive in NAD. Dr. Tomi Bamberger at bedside.

## 2014-06-15 NOTE — Progress Notes (Addendum)
ANTICOAGULATION CONSULT NOTE - Initial Consult  Pharmacy Consult for heparin Indication: Submassive BL PE and hx of DVT  Allergies  Allergen Reactions  . Ciprofloxacin Hives    Patient Measurements:   Heparin Dosing Weight: 69 kg  Vital Signs: Temp: 98.4 F (36.9 C) (07/09 1553) BP: 147/79 mmHg (07/09 1918) Pulse Rate: 84 (07/09 1918)  Labs:  Recent Labs  06/15/14 1610  HGB 13.5  HCT 40.7  PLT 215  CREATININE 0.73    The CrCl is unknown because both a height and weight (above a minimum accepted value) are required for this calculation.   Medical History: Past Medical History  Diagnosis Date  . Stricture and stenosis of esophagus   . Other and unspecified hyperlipidemia   . Dysthymic disorder   . Other and unspecified coagulation defects   . Malignant neoplasm of breast (female), unspecified site   . Diaphragmatic hernia without mention of obstruction or gangrene   . PAD (peripheral artery disease)   . Colitis, ulcerative     Left sided  . PVD (peripheral vascular disease)     Medications:  Scheduled:  Infusions:   Assessment: 68 yo female with submassive BL PE will be put on heparin therapy.  This is an acute PE with right heart strain.  Patient has a history of DVT and was on xarelto 20mg  po daily prior to admission. Last dose of xarelto was ~0730 on 06/15/14.  SCr 0.73 (CrCl > 30) and Hgb 13.5 and Plt 215 K.  Baseline aPTT 38 and heparin level > 2.2.  Goal of Therapy:  aPTT 66-102 seconds and heparin level 0.6-1.2 Monitor platelets by anticoagulation protocol: Yes   Plan:  1) Stat baseline heparin level and aPTT 2) Start heparin drip at 1000 units/hr now due to acute PE and signs of heart strain. No bolus. Check 6hr heparin level and aPTT after drip is started. 3) Daily heparin level and CBC  Kaidence Sant, Tsz-Yin 06/15/2014,8:00 PM

## 2014-06-15 NOTE — Progress Notes (Signed)
Received pt report from [place].

## 2014-06-15 NOTE — ED Notes (Signed)
Bed control aware of change in admission diagnosis.

## 2014-06-15 NOTE — ED Provider Notes (Signed)
Patient reports she has had DVT in the past. She reports she started having swelling of her right lower extremity about one and a half weeks ago with shortness of breath about the same time. She denies any chest pain. She states she has been having difficulty doing her usual activities because of dyspnea. Patient had a Doppler ultrasound of her leg done this week which showed DVT. She was started on xarelto. Patient presents to the emergency department for evaluation of shortness of breath.  Patient is currently not in respiratory distress. She is noted to have diffuse swelling of her right lower extremity.  Medical screening examination/treatment/procedure(s) were conducted as a shared visit with non-physician practitioner(s) and myself.  I personally evaluated the patient during the encounter.   EKG Interpretation None       Rolland Porter, MD, [facility]   Janice Norrie, MD 06/15/14 [phone]

## 2014-06-15 NOTE — H&P (Signed)
[facility] History and Physical  Theresa [name] FFM:[id number] DOB: 1946-09-30 DOA: 06/15/2014  Referring physician: er PCP:  Melinda Crutch, MD   Chief Complaint: SOB  HPI: Theresa [name] is a 68 y.o. female  Who was recently diagnosed with DVT by PCP- had several weeks of right leg swelling.  Has had recent trips to [place] but swelling started before beach trip.   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 +SOB, no fever, no chills  In the ER, CTA was done that showed PE with RHS- ER provider spoke with PCCM doc who recommended [facility] admit and place on heparin.  Hypercoagulable panel ordered before heparin given.   I also spoke with on call Hematology who said patient may follow up with her oncologist (Dr. Rogue Jury) for panel results and treatment    Review of Systems:  All systems reviewed, negative unless stated above   Past Medical History  Diagnosis Date  . Stricture and stenosis of esophagus   . Other and unspecified hyperlipidemia   . Dysthymic disorder   . Other and unspecified coagulation defects   . Malignant neoplasm of breast (female), unspecified site   . Diaphragmatic hernia without mention of obstruction or gangrene   . PAD (peripheral artery disease)   . Colitis, ulcerative     Left sided  . PVD (peripheral vascular disease)    Past Surgical History  Procedure Laterality Date  . Cholecystectomy    . Appendectomy    . Tubal ligation    . Breast lumpectomy      Right   Social History:  reports that she quit smoking about 11 years ago. She has never used smokeless tobacco. She reports that she drinks about 7 ounces of alcohol per week. She reports that she  does not use illicit drugs.  Allergies  Allergen Reactions  . Ciprofloxacin Hives    Family History  Problem Relation Age of Onset  . Heart disease Father   . Heart attack Father   . Colon cancer Neg Hx   . Lung cancer Maternal Grandfather   . Deep vein thrombosis Daughter      Prior to Admission medications   Medication Sig Start Date End Date Taking? Authorizing Provider  aspirin 81 MG EC tablet Take 81 mg by mouth daily.    Yes Historical Provider, MD  escitalopram (LEXAPRO) 10 MG tablet Take 5 mg by mouth daily.   Yes Historical Provider, MD  ibuprofen (ADVIL) 200 MG tablet Take 400 mg by mouth every 6 (six) hours as needed for mild pain. For headache pain   Yes Historical Provider, MD  mirtazapine (REMERON) 30 MG tablet Take 30 mg by mouth at bedtime.   Yes Historical Provider, MD  Multiple Vitamins-Minerals (MULTIVITAMINS) CHEW Chew 1 tablet by mouth daily.   Yes Historical Provider, MD  omeprazole (PRILOSEC) 20 MG capsule Take 20 mg by mouth daily.    Yes Historical Provider, MD  XARELTO 20 MG TABS tablet Take 20 mg by mouth daily.  06/13/14  Yes Historical Provider, MD   Physical Exam: Filed Vitals:   06/15/14 1918  BP: 147/79  Pulse: 84  Temp:   Resp:  BP 147/79  Pulse 84  Temp(Src) 98.4 F (36.9 C)  Resp 18  SpO2 94%  General:  Appears calm and comfortable, no increased work of breathing Eyes: PERRL, normal lids, irises & conjunctiva ENT: grossly normal hearing, lips & tongue Neck: no LAD, masses or thyromegaly Cardiovascular: RRR, no m/r/g. Edema on right leg Respiratory: CTA bilaterally, no w/r/r. Normal respiratory effort. Abdomen: soft, ntnd Skin: no rash or induration seen on limited exam Musculoskeletal: grossly normal tone BUE/BLE Psychiatric: grossly normal mood and affect, speech fluent and appropriate Neurologic: grossly non-focal.          Labs on Admission:  Basic Metabolic Panel:  Recent Labs Lab 06/15/14 1610  NA 146  K 3.8  CL  106  CO2 25  GLUCOSE 113*  BUN 8  CREATININE 0.73  CALCIUM 8.1*   Liver Function Tests:  Recent Labs Lab 06/15/14 1610  AST 32  ALT 21  ALKPHOS 102  BILITOT 0.5  PROT 6.5  ALBUMIN 2.9*   No results found for this basename: LIPASE, AMYLASE,  in the last 168 hours No results found for this basename: AMMONIA,  in the last 168 hours CBC:  Recent Labs Lab 06/15/14 1610  WBC 8.4  HGB 13.5  HCT 40.7  MCV 96.4  PLT 215   Cardiac Enzymes: No results found for this basename: CKTOTAL, CKMB, CKMBINDEX, TROPONINI,  in the last 168 hours  BNP (last 3 results)  Recent Labs  06/15/14 1610  PROBNP 306.1*   CBG: No results found for this basename: GLUCAP,  in the last 168 hours  Radiological Exams on Admission: Dg Chest 2 View  06/15/2014   CLINICAL DATA:  Shortness of breath since last week. Blood clot in right leg.  EXAM: CHEST  2 VIEW  COMPARISON:  02/27/2014.  FINDINGS: The heart size and mediastinal contours are stable. There is stable mild biapical pleural thickening. The lungs are clear. There is no pleural effusion or pneumothorax. There are stable degenerative changes in the spine associated with a mild convex right scoliosis. Cholecystectomy clips are noted.  IMPRESSION: No active cardiopulmonary process.   Electronically Signed   By: Camie Patience M.D.   On: 06/15/2014 18:23   Ct Angio Chest Pe W/cm &/or Wo Cm  06/15/2014   CLINICAL DATA:  Known deep venous thrombosis; now hypoxic and dyspnea on exertion  EXAM: CT ANGIOGRAPHY CHEST WITH CONTRAST  TECHNIQUE: Multidetector CT imaging of the chest was performed using the standard protocol during bolus administration of intravenous contrast. Multiplanar CT image reconstructions and MIPs were obtained to evaluate the vascular anatomy.  CONTRAST:  134mL OMNIPAQUE IOHEXOL 350 MG/ML SOLN intravenously  COMPARISON:  PA and lateral chest x-ray of today's date.  FINDINGS: There are multiple filling defects within mid and peripheral  branches branches of the right and left pulmonary arteries. No central thrombus is demonstrated. The RV -LV ratio is approximately 1. The cardiac chambers are top-normal in size. The caliber of the thoracic aorta is normal. There is no false lumen. There is no lymphadenopathy nor pleural or pericardial effusion.  The lungs are well-expanded. Subpleural increased interstitial density is present posteriorly in both lungs. There is no alveolar infiltrate.  The observed portions of the liver and spleen exhibit no acute abnormality. There is likely fatty infiltration of the liver. The bony thorax exhibits no acute abnormality.  Review of the MIP images confirms the above findings.  IMPRESSION: 1. There are multi focal bilateral pulmonary emboli . There is no  evidence of pulmonary infarction or pleural effusion. Positive for acute PE with CT evidence of right heartstrain (RV/LV Ratio = 1.0) consistent with at least submassive (intermediate risk)PE. The presence of right heart strain has been associated with an increased risk of morbidity and mortality. Consultation with Pulmonary and Critical Care Medicine is recommended. 2. There is borderline cardiomegaly. There is minimal compressive atelectasis in both lungs. No pulmonary parenchymal nodules or masses are demonstrated. 3. Critical Value/emergent results were called by telephone at the time of interpretation on 06/15/2014 at 6:58 PM to Dr. Margarita Mail , who verbally acknowledged these results.   Electronically Signed   By: David  Martinique   On: 06/15/2014 19:01    EKG: Independently reviewed. Sinus tach  Assessment/Plan Active Problems:   ADENOCARCINOMA, BREAST   Pulmonary embolus   Acute respiratory failure with hypoxia    PE with DVT- 3rd episode; IV heparin as patient has RHS, echo, check O2 sats with ambulation -side bar with oncology- sent hypercoag panel- once stable- resume xarelto -ER consult with PCCM- no need for thombolytics  Acute resp  failure with hypoxia- O2- wean as tolerated  H/o breast adenocarinoma- stable  Spoke with oncology- can follow up in 2 weeks with Dr. Rogue Jury   Code Status: full family Communication: husband at bedside Disposition Plan: admit  Time spent: 75 min  Eulogio Bear [facility] Pager [phone] **Disclaimer: This note may have been dictated with voice recognition software. Similar sounding words can inadvertently be transcribed and this note may contain transcription errors which may not have been corrected upon publication of note.**

## 2014-06-15 NOTE — ED Provider Notes (Signed)
CSN: [id number]     Arrival date & time 06/15/14  1535 History   First MD Initiated Contact with Patient 06/15/14 1649     Chief Complaint  Patient presents with  . Shortness of Breath     (Consider location/radiation/quality/duration/timing/severity/associated sxs/prior Treatment) The history is provided by the patient and medical records. No language interpreter was used.    Theresa [name] is a(n) 68 y.o. female who presents to the emergency department with chief complaint of shortness of breath. The patient has a confirmed a DVT and was started on Xarelto 4 days ago. She began Monday night and has taken 4 doses of 15 mg BID ioncluding this morning's dose. The patient states that last week she developed pain and swelling in the right leg. [place] the beach and states that she had worsening dyspnea on exertion. Patient was sent in today by her primary care physician elevated pro BNP and hypoxia.  Patient says she has been compliant with medications./  Past Medical History  Diagnosis Date  . Stricture and stenosis of esophagus   . Other and unspecified hyperlipidemia   . Dysthymic disorder   . Other and unspecified coagulation defects   . Malignant neoplasm of breast (female), unspecified site   . Diaphragmatic hernia without mention of obstruction or gangrene   . PAD (peripheral artery disease)   . Colitis, ulcerative     Left sided  . PVD (peripheral vascular disease)    Past Surgical History  Procedure Laterality Date  . Cholecystectomy    . Appendectomy    . Tubal ligation    . Breast lumpectomy      Right   Family History  Problem Relation Age of Onset  . Heart disease Father   . Heart attack Father   . Colon cancer Neg Hx   . Lung cancer Maternal Grandfather   . Deep vein thrombosis Daughter    History  Substance Use Topics  . Smoking status: Former Smoker    Quit date: 12/08/2002  . Smokeless tobacco: Never Used  . Alcohol Use: 7.0 oz/week    14  drink(s) per week     Comment: 2 drinks per night   OB History   Grav Para Term Preterm Abortions TAB SAB Ect Mult Living                 Review of Systems  Ten systems reviewed and are negative for acute change, except as noted in the HPI.    Allergies  Ciprofloxacin  Home Medications   Prior to Admission medications   Medication Sig Start Date End Date Taking? Authorizing Provider  aspirin 81 MG EC tablet Take 81 mg by mouth daily.    Yes Historical Provider, MD  escitalopram (LEXAPRO) 10 MG tablet Take 5 mg by mouth daily.   Yes Historical Provider, MD  ibuprofen (ADVIL) 200 MG tablet Take 400 mg by mouth every 6 (six) hours as needed for mild pain. For headache pain   Yes Historical Provider, MD  mirtazapine (REMERON) 30 MG tablet Take 30 mg by mouth at bedtime.   Yes Historical Provider, MD  Multiple Vitamins-Minerals (MULTIVITAMINS) CHEW Chew 1 tablet by mouth daily.   Yes Historical Provider, MD  omeprazole (PRILOSEC) 20 MG capsule Take 20 mg by mouth daily.    Yes Historical Provider, MD  XARELTO 20 MG TABS tablet Take 20 mg by mouth daily.  06/13/14  Yes Historical Provider, MD   BP 135/88  Pulse  86  Temp(Src) 98.4 F (36.9 C)  Resp 18  SpO2 91% Physical Exam  Nursing note and vitals reviewed. Constitutional: She is oriented to person, place, and time. She appears well-developed and well-nourished. No distress.  HENT:  Head: Normocephalic and atraumatic.  Eyes: Conjunctivae are normal. No scleral icterus.  Neck: Normal range of motion.  Cardiovascular: Normal rate, regular rhythm and normal heart sounds.  Exam reveals no gallop and no friction rub.   No murmur heard. RLE with 2+ pitting edema and calf tenderness.  Pulmonary/Chest: Effort normal and breath sounds normal. No respiratory distress. She has no wheezes. She exhibits no tenderness.  Abdominal: Soft. Bowel sounds are normal. She exhibits no distension and no mass. There is no tenderness. There is no  guarding.  Neurological: She is alert and oriented to person, place, and time.  Skin: Skin is warm and dry. She is not diaphoretic.    ED Course  Procedures (including critical care time) Labs Review Labs Reviewed  CBC - Abnormal; Notable for the following:    RDW 15.8 (*)    All other components within normal limits  PRO B NATRIURETIC PEPTIDE - Abnormal; Notable for the following:    Pro B Natriuretic peptide (BNP) 306.1 (*)    All other components within normal limits  COMPREHENSIVE METABOLIC PANEL - Abnormal; Notable for the following:    Glucose, Bld 113 (*)    Calcium 8.1 (*)    Albumin 2.9 (*)    GFR calc non Af Amer 86 (*)    All other components within normal limits  I-STAT TROPOININ, ED    Imaging Review Dg Chest 2 View  06/15/2014   CLINICAL DATA:  Shortness of breath since last week. Blood clot in right leg.  EXAM: CHEST  2 VIEW  COMPARISON:  02/27/2014.  FINDINGS: The heart size and mediastinal contours are stable. There is stable mild biapical pleural thickening. The lungs are clear. There is no pleural effusion or pneumothorax. There are stable degenerative changes in the spine associated with a mild convex right scoliosis. Cholecystectomy clips are noted.  IMPRESSION: No active cardiopulmonary process.   Electronically Signed   By: Camie Patience M.D.   On: 06/15/2014 18:23   Ct Angio Chest Pe W/cm &/or Wo Cm  06/15/2014   CLINICAL DATA:  Known deep venous thrombosis; now hypoxic and dyspnea on exertion  EXAM: CT ANGIOGRAPHY CHEST WITH CONTRAST  TECHNIQUE: Multidetector CT imaging of the chest was performed using the standard protocol during bolus administration of intravenous contrast. Multiplanar CT image reconstructions and MIPs were obtained to evaluate the vascular anatomy.  CONTRAST:  173mL OMNIPAQUE IOHEXOL 350 MG/ML SOLN intravenously  COMPARISON:  PA and lateral chest x-ray of today's date.  FINDINGS: There are multiple filling defects within mid and peripheral  branches branches of the right and left pulmonary arteries. No central thrombus is demonstrated. The RV -LV ratio is approximately 1. The cardiac chambers are top-normal in size. The caliber of the thoracic aorta is normal. There is no false lumen. There is no lymphadenopathy nor pleural or pericardial effusion.  The lungs are well-expanded. Subpleural increased interstitial density is present posteriorly in both lungs. There is no alveolar infiltrate.  The observed portions of the liver and spleen exhibit no acute abnormality. There is likely fatty infiltration of the liver. The bony thorax exhibits no acute abnormality.  Review of the MIP images confirms the above findings.  IMPRESSION: 1. There are multi focal bilateral pulmonary emboli .  There is no evidence of pulmonary infarction or pleural effusion. Positive for acute PE with CT evidence of right heartstrain (RV/LV Ratio = 1.0) consistent with at least submassive (intermediate risk)PE. The presence of right heart strain has been associated with an increased risk of morbidity and mortality. Consultation with Pulmonary and Critical Care Medicine is recommended. 2. There is borderline cardiomegaly. There is minimal compressive atelectasis in both lungs. No pulmonary parenchymal nodules or masses are demonstrated. 3. Critical Value/emergent results were called by telephone at the time of interpretation on 06/15/2014 at 6:58 PM to Dr. Margarita Mail , who verbally acknowledged these results.   Electronically Signed   By: David  Martinique   On: 06/15/2014 19:01     EKG Interpretation None      MDM   Final diagnoses:  Pulmonary embolus    7:03 PM BP 135/88  Pulse 86  Temp(Src) 98.4 F (36.9 C)  Resp 18  SpO2 91% Patient was very elevated BNP. I got a call from radiology Dr. David Martinique states that patient has milky multifocal peripheral bilateral pulmonary emboli no infarction or effusion however she does appear to have some right heart strain  consistent with a submassive PE is also increases the patient's risk of morbidity or mortality. CXR is clear. Patient states that her DVT spans the entire RLE from the ankle to the knee. initial labs unremarkable with mild hyperglycemia.   7:15 PM BP 135/88  Pulse 86  Temp(Src) 98.4 F (36.9 C)  Resp 18  SpO2 91% I spoke with Dr. Chase Caller. The patient has an Intermediate PESI score and will not require local thrombolysis. She will require heparinization. I will call Hospitalists for [place].   Patient admitted to telemetry floor by Dr. Eulogio Bear. The patient appears reasonably stabilized for admission considering the current resources, flow, and capabilities available in the ED at this time, and I doubt any other [facility] requiring further screening and/or treatment in the ED prior to admission.    Margarita Mail, PA-C 06/16/14 [place], PA-C 06/16/14 [phone]

## 2014-06-15 NOTE — ED Notes (Signed)
PT found out blood clot in right leg.  Pt had blood work done and they were concerned about something related to her heart ? BNP and concerned for PE  Because O2 level was 89-90 and had elevated HR.  PT complains of sob and has pain to right leg.  Right leg with swelling, faint palpated pulse

## 2014-06-16 DIAGNOSIS — K219 Gastro-esophageal reflux disease without esophagitis: Secondary | ICD-10-CM

## 2014-06-16 DIAGNOSIS — I369 Nonrheumatic tricuspid valve disorder, unspecified: Secondary | ICD-10-CM

## 2014-06-16 DIAGNOSIS — F341 Dysthymic disorder: Secondary | ICD-10-CM

## 2014-06-16 LAB — CBC
HCT: 40.4 % (ref 36.0–46.0)
HEMOGLOBIN: 13 g/dL (ref 12.0–15.0)
MCH: 31.3 pg (ref 26.0–34.0)
MCHC: 32.2 g/dL (ref 30.0–36.0)
MCV: 97.3 fL (ref 78.0–100.0)
Platelets: 210 10*3/uL (ref 150–400)
RBC: 4.15 MIL/uL (ref 3.87–5.11)
RDW: 16 % — ABNORMAL HIGH (ref 11.5–15.5)
WBC: 9.5 10*3/uL (ref 4.0–10.5)

## 2014-06-16 LAB — APTT
APTT: 34 s (ref 24–37)
APTT: 62 s — AB (ref 24–37)
APTT: 108 s — AB (ref 24–37)

## 2014-06-16 LAB — COMPREHENSIVE METABOLIC PANEL
ALT: 19 U/L (ref 0–35)
AST: 27 U/L (ref 0–37)
Albumin: 2.7 g/dL — ABNORMAL LOW (ref 3.5–5.2)
Alkaline Phosphatase: 98 U/L (ref 39–117)
Anion gap: 14 (ref 5–15)
BUN: 7 mg/dL (ref 6–23)
CO2: 28 mEq/L (ref 19–32)
Calcium: 7.9 mg/dL — ABNORMAL LOW (ref 8.4–10.5)
Chloride: 104 mEq/L (ref 96–112)
Creatinine, Ser: 0.72 mg/dL (ref 0.50–1.10)
GFR calc Af Amer: >90 mL/min (ref >90)
GFR calc non Af Amer: 86 mL/min — ABNORMAL LOW (ref >90)
Glucose, Bld: 117 mg/dL — ABNORMAL HIGH (ref 70–99)
Potassium: 3.7 mEq/L (ref 3.7–5.3)
Sodium: 146 mEq/L (ref 137–147)
Total Bilirubin: 0.7 mg/dL (ref 0.3–1.2)
Total Protein: 6.1 g/dL (ref 6.0–8.3)

## 2014-06-16 LAB — HOMOCYSTEINE: Homocysteine: 21.4 umol/L — ABNORMAL HIGH (ref 4.0–15.4)

## 2014-06-16 LAB — HEPARIN LEVEL (UNFRACTIONATED): Heparin Unfractionated: >2.2 IU/mL — ABNORMAL HIGH (ref 0.30–0.70)

## 2014-06-16 MED ORDER — LORAZEPAM 0.5 MG PO TABS: 0.5000 mg | ORAL_TABLET | Freq: Once | ORAL | Status: DC

## 2014-06-16 MED ORDER — SIMETHICONE 40 MG/0.6ML PO SUSP
40.0000 mg | Freq: Four times a day (QID) | ORAL | Status: DC | PRN
  Filled 2014-06-16: qty 0.6

## 2014-06-16 MED ORDER — BOOST PLUS PO LIQD
237.0000 mL | ORAL | Status: DC
  Administered 2014-06-16: 237 mL via ORAL
  Filled 2014-06-16 (×3): qty 237

## 2014-06-16 MED ORDER — CAMPHOR-MENTHOL 0.5-0.5 % EX LOTN
TOPICAL_LOTION | CUTANEOUS | Status: DC | PRN
  Administered 2014-06-16 (×2): via TOPICAL
  Filled 2014-06-16: qty 222

## 2014-06-16 MED ORDER — HEPARIN BOLUS VIA INFUSION
2000.0000 [IU] | Freq: Once | INTRAVENOUS | Status: AC
  Administered 2014-06-16: 2000 [IU] via INTRAVENOUS
  Filled 2014-06-16: qty 2000

## 2014-06-16 MED ORDER — SODIUM CHLORIDE 0.9 % IV SOLN
INTRAVENOUS | Status: DC
  Administered 2014-06-16: 50 mL/h via INTRAVENOUS
  Administered 2014-06-16 – 2014-06-17 (×2): via INTRAVENOUS

## 2014-06-16 MED ORDER — PNEUMOCOCCAL VAC POLYVALENT 25 MCG/0.5ML IJ INJ
0.5000 mL | INJECTION | INTRAMUSCULAR | Status: DC
  Filled 2014-06-16: qty 0.5

## 2014-06-16 MED ORDER — DIPHENHYDRAMINE HCL 25 MG PO CAPS
25.0000 mg | ORAL_CAPSULE | Freq: Four times a day (QID) | ORAL | Status: DC | PRN
  Administered 2014-06-16: 25 mg via ORAL
  Filled 2014-06-16: qty 1

## 2014-06-16 NOTE — Progress Notes (Signed)
Pt arrived on unit, alert and oriented x4. Able to make needs known. In no distress, no SOB noted. Vital signs taken and stable. RAC IV site clean, dry and intact, no redness. Skin intact as assessed, noted a R leg swelling. Instructed pt to be on bedrest per order. Placed on tele box #21. Pt care guide provided. Bed place at its lowest position. Call light within reached. Oriented to room and staff. We will continue to monitor.

## 2014-06-16 NOTE — Progress Notes (Signed)
Tried to put pt back on room air. Pt dropped to 88% on RA. Placed pt back on 1L of O2 via nasal cannula. Pt is 94% on 1L of O2. Explained to pt that we needed to leave her on O2 tonight. Pt stated understanding. Will continue to monitor pt. Ranelle Oyster, RN

## 2014-06-16 NOTE — Progress Notes (Signed)
ANTICOAGULATION CONSULT NOTE - Follow Up Consult  Pharmacy Consult for Heparin  Indication: pulmonary embolus, hx DVT  Allergies  Allergen Reactions  . Ciprofloxacin Hives   Patient Measurements: Height: 5\' 2"  (157.5 cm) Weight: 162 lb 8 oz (73.71 kg) IBW/kg (Calculated) : 50.1  Vital Signs: Temp: 99.4 F (37.4 C) (07/10 0556) Temp src: Oral (07/10 0556) BP: 145/80 mmHg (07/10 0556) Pulse Rate: 109 (07/10 0556)  Labs:  Recent Labs  06/15/14 1610 06/15/14 2141 06/16/14 0300 06/16/14 0400 06/16/14 1210  HGB 13.5  --   --  13.0  --   HCT 40.7  --   --  40.4  --   PLT 215  --   --  210  --   APTT  --  38*  --  34 62*  HEPARINUNFRC  --  >2.20*  --  >2.20*  --   CREATININE 0.73  --  0.72  --   --     Estimated Creatinine Clearance: 63.2 ml/min (by C-G formula based on Cr of 0.72).   Medications:  Heparin 1250 units/hr  Assessment: 68 y/o F on heparin for acute PE, has hx DVT on xarelto PTA. APTT remains slightly below goal. No bleeding noted.   Goal of Therapy:  Heparin level 0.3-0.7 units/ml aPTT 66-102 seconds Monitor platelets by anticoagulation protocol: Yes   Plan:  1. Increase heparin gtt to 1300 units/hr 2. Check an 8 hour aPTT 3. Daily aPTT, heparin level and CBC 4. F/u plans for oral anticoag  Salome Arnt, PharmD, BCPS Pager # [phone] 06/16/2014 1:13 PM

## 2014-06-16 NOTE — Progress Notes (Signed)
UR completed. Kiley Torrence RN CCM Case Mgmt phone [phone] 

## 2014-06-16 NOTE — Progress Notes (Signed)
ANTICOAGULATION CONSULT NOTE - Follow Up Consult  Pharmacy Consult for Heparin  Indication: pulmonary embolus, hx DVT  Allergies  Allergen Reactions  . Ciprofloxacin Hives   Patient Measurements: Height: 5\' 2"  (157.5 cm) Weight: 162 lb 8 oz (73.71 kg) IBW/kg (Calculated) : 50.1  Vital Signs: Temp: 98.9 F (37.2 C) (07/09 2015) Temp src: Oral (07/09 2015) BP: 161/89 mmHg (07/09 2015) Pulse Rate: 86 (07/09 2015)  Labs:  Recent Labs  06/15/14 1610 06/15/14 2141 06/16/14 0300 06/16/14 0400  HGB 13.5  --   --  13.0  HCT 40.7  --   --  40.4  PLT 215  --   --  210  APTT  --  38*  --  34  HEPARINUNFRC  --  >2.20*  --  >2.20*  CREATININE 0.73  --  0.72  --     Estimated Creatinine Clearance: 63.2 ml/min (by C-G formula based on Cr of 0.72).   Medications:  Heparin 1000 units/hr  Assessment: 68 y/o F on heparin for acute PE, has hx DVT on xarelto PTA, HL is still >2.2 so will continue to use aPTT to dose. APTT is low as 34, other labs as above. No issues per RN.   Goal of Therapy:  Heparin level 0.3-0.7 units/ml aPTT 66-102 seconds Monitor platelets by anticoagulation protocol: Yes   Plan:  -Heparin 2000 units BOLUS given acute PE with heart strain -Increase heparin drip to 1250 units/hr -1200 aPTT -Daily CBC/HL/aPTT -Monitor for bleeding  Narda Bonds 06/16/2014,5:36 AM

## 2014-06-16 NOTE — Progress Notes (Signed)
  Echocardiogram 2D Echocardiogram has been performed.  Mauricio Po 06/16/2014, 11:46 AM

## 2014-06-16 NOTE — Care Management Note (Signed)
    Page 1 of 1   06/17/2014     11:06:57 AM CARE MANAGEMENT NOTE 06/17/2014  Patient:  Theresa [name], Theresa [name]   Account Number:  [id number]  Date Initiated:  06/16/2014  Documentation initiated by:  [facility]  Subjective/Objective Assessment:   PE, DVT     Action/Plan:   hep gtt   Anticipated DC Date:  06/18/2014   Anticipated DC Plan:  [place]  CM consult      PAC Choice  DURABLE MEDICAL EQUIPMENT   Choice offered to / List presented to:  C-1 Patient   DME arranged  [facility]      DME agency  [facility]        Status of service:  Completed, signed off Medicare Important Message given?  NA - LOS <3 / Initial given by admissions (If response is "NO", the following Medicare IM given date fields will be blank) Date Medicare IM given:   Medicare IM given by:   Date Additional Medicare IM given:   Additional Medicare IM given by:    Discharge Disposition:  HOME/SELF CARE  Per UR Regulation:  Reviewed for med. necessity/level of care/duration of stay  If discussed at [place] of Stay Meetings, dates discussed:    Comments:  06-17-14 [place], [place] [phone] CM spoke to  pt and she is agreeable ot DME oxygen via [facility]. Referral made and dme to be delivered to room before d/c. Pt is refusing [place] services at this time. CM did provide pt with 30 day free xarelto card. MD please write Rx for 30 day free no refills and then the original Rx with refills. Thanks. Pt uses  [facility] on [address] medication is available and co pay is 45.00 month. No further needs at this time.

## 2014-06-16 NOTE — Progress Notes (Signed)
ANTICOAGULATION CONSULT NOTE - Follow Up Consult  Pharmacy Consult for Heparin  Indication: pulmonary embolus, hx DVT  Allergies  Allergen Reactions  . Ciprofloxacin Hives   Patient Measurements: Height: 5\' 2"  (157.5 cm) Weight: 162 lb 8 oz (73.71 kg) IBW/kg (Calculated) : 50.1  Vital Signs: Temp: 99.4 F (37.4 C) (07/10 2046) Temp src: Oral (07/10 2046) BP: 107/73 mmHg (07/10 2046) Pulse Rate: 96 (07/10 2046)  Labs:  Recent Labs  06/15/14 1610  06/15/14 2141 06/16/14 0300 06/16/14 0400 06/16/14 1210 06/16/14 2104  HGB 13.5  --   --   --  13.0  --   --   HCT 40.7  --   --   --  40.4  --   --   PLT 215  --   --   --  210  --   --   APTT  --   < > 38*  --  34 62* 108*  HEPARINUNFRC  --   --  >2.20*  --  >2.20*  --   --   CREATININE 0.73  --   --  0.72  --   --   --   < > = values in this interval not displayed.  Estimated Creatinine Clearance: 63.2 ml/min (by C-G formula based on Cr of 0.72).   Medications:  Heparin 1300 units/hr  Assessment: 68 y/o F on heparin for acute PE, has hx DVT on xarelto PTA, using aPTT to dose for now. APTT is 108, other labs as above.   Goal of Therapy:  Heparin level 0.3-0.7 units/ml aPTT 66-102 seconds Monitor platelets by anticoagulation protocol: Yes   Plan:  -Reduce heparin drip to 1200 units/hr -aPTT/HL with AM labs -Daily CBC/HL/aPTT -Monitor for bleeding  Narda Bonds 06/16/2014,10:57 PM

## 2014-06-16 NOTE — Progress Notes (Signed)
[facility] PROGRESS NOTE  Theresa [name] IWL:[id number] DOB: 1946/08/07 DOA: 06/15/2014 PCP:  Melinda Crutch, MD  Assessment/Plan: 1-DVT/PE: patient with recently diagnosed DVT and started on xarelto as an outpatient; came complaining of increase SOB and chest discomfort. CTA positive for PE and suggesting RHS. -continue heparin drip -follow 2-D echo -PRN oxygen supplementation Advance activity -hypercoagulable panel is pending (this will be follow with oncologist Dr. Earlie Server as an outpatient) -will most likely required life-long anticoagulation -will start flutter valve  2-Acute resp failure with hypoxia: due to #1. -patient with elevated BNP, but no hx of CHF -follow 2-D echo -continue heart healthy diet  3-hx of breast cancer: continue outpatient follow up with oncology service  4-GERD: continue PPI  5-depression: continue lexapro and remeron  6-bloating sensation and hx of constipation: will check TSH and free T4. -will use PRN simethicone  7-hypoalbuminemia: will use boost daily  DVT: no heparin drip; satisfy prophylaxis   Code Status: Full Family Communication: husband at bedside Disposition Plan: home most likely in am 7/11 (once RHS completely r/o with 2-D echo   Consultants:  None   Procedures:  See below for x-ray reports  2-D echo  Antibiotics:  None   HPI/Subjective: Feeling better, no fever. Patient is no wheezing and no crackles on exam. Complaining of bloating sensation  Objective: Filed Vitals:   06/16/14 0556  BP: 145/80  Pulse: 109  Temp: 99.4 F (37.4 C)  Resp: 16    Intake/Output Summary (Last 24 hours) at 06/16/14 1120 Last data filed at 06/16/14 0929  Gross per 24 hour  Intake    100 ml  Output      0 ml  Net    100 ml   Filed Weights   06/15/14 2015  Weight: 73.71 kg (162 lb 8 oz)    Exam:   General:  Feeling better and breathing easier. Still with some discomfort with deep inspiration. No  fever  Cardiovascular: slight tachycardic, no rubs or gallops; sinus rhythm, S1 and S2 appreciated on exam  Respiratory: good air movement bilaterally, no crackles, no wheezing  Abdomen: soft, NT, no rebound or guarding, positive BS  Musculoskeletal: trace edema bilaterally, tenderness to palpation on her right calf  Data Reviewed: Basic Metabolic Panel:  Recent Labs Lab 06/15/14 1610 06/16/14 0300  NA 146 146  K 3.8 3.7  CL 106 104  CO2 25 28  GLUCOSE 113* 117*  BUN 8 7  CREATININE 0.73 0.72  CALCIUM 8.1* 7.9*   Liver Function Tests:  Recent Labs Lab 06/15/14 1610 06/16/14 0300  AST 32 27  ALT 21 19  ALKPHOS 102 98  BILITOT 0.5 0.7  PROT 6.5 6.1  ALBUMIN 2.9* 2.7*   CBC:  Recent Labs Lab 06/15/14 1610 06/16/14 0400  WBC 8.4 9.5  HGB 13.5 13.0  HCT 40.7 40.4  MCV 96.4 97.3  PLT 215 210   BNP (last 3 results)  Recent Labs  06/15/14 1610  PROBNP 306.1*     Studies: Dg Chest 2 View  06/15/2014   CLINICAL DATA:  Shortness of breath since last week. Blood clot in right leg.  EXAM: CHEST  2 VIEW  COMPARISON:  02/27/2014.  FINDINGS: The heart size and mediastinal contours are stable. There is stable mild biapical pleural thickening. The lungs are clear. There is no pleural effusion or pneumothorax. There are stable degenerative changes in the spine associated with a mild convex right scoliosis. Cholecystectomy clips are noted.  IMPRESSION: No active cardiopulmonary  process.   Electronically Signed   By: Camie Patience M.D.   On: 06/15/2014 18:23   Ct Angio Chest Pe W/cm &/or Wo Cm  06/15/2014   CLINICAL DATA:  Known deep venous thrombosis; now hypoxic and dyspnea on exertion  EXAM: CT ANGIOGRAPHY CHEST WITH CONTRAST  TECHNIQUE: Multidetector CT imaging of the chest was performed using the standard protocol during bolus administration of intravenous contrast. Multiplanar CT image reconstructions and MIPs were obtained to evaluate the vascular anatomy.  CONTRAST:   180mL OMNIPAQUE IOHEXOL 350 MG/ML SOLN intravenously  COMPARISON:  PA and lateral chest x-ray of today's date.  FINDINGS: There are multiple filling defects within mid and peripheral branches branches of the right and left pulmonary arteries. No central thrombus is demonstrated. The RV -LV ratio is approximately 1. The cardiac chambers are top-normal in size. The caliber of the thoracic aorta is normal. There is no false lumen. There is no lymphadenopathy nor pleural or pericardial effusion.  The lungs are well-expanded. Subpleural increased interstitial density is present posteriorly in both lungs. There is no alveolar infiltrate.  The observed portions of the liver and spleen exhibit no acute abnormality. There is likely fatty infiltration of the liver. The bony thorax exhibits no acute abnormality.  Review of the MIP images confirms the above findings.  IMPRESSION: 1. There are multi focal bilateral pulmonary emboli . There is no evidence of pulmonary infarction or pleural effusion. Positive for acute PE with CT evidence of right heartstrain (RV/LV Ratio = 1.0) consistent with at least submassive (intermediate risk)PE. The presence of right heart strain has been associated with an increased risk of morbidity and mortality. Consultation with Pulmonary and Critical Care Medicine is recommended. 2. There is borderline cardiomegaly. There is minimal compressive atelectasis in both lungs. No pulmonary parenchymal nodules or masses are demonstrated. 3. Critical Value/emergent results were called by telephone at the time of interpretation on 06/15/2014 at 6:58 PM to Dr. Margarita Mail , who verbally acknowledged these results.   Electronically Signed   By: David  Martinique   On: 06/15/2014 19:01    Scheduled Meds: . escitalopram  5 mg Oral Daily  . mirtazapine  30 mg Oral QHS  . pantoprazole  80 mg Oral Daily  . sodium chloride  3 mL Intravenous Q12H   Continuous Infusions: . heparin 1,250 Units/hr (06/16/14 0600)     Time spent: > 30 minutes    Barton Dubois  [facility] Pager [phone]. If 7PM-7AM, please contact night-coverage at [email or web address], password [facility] 06/16/2014, 11:20 AM  LOS: 1 day

## 2014-06-16 NOTE — Progress Notes (Addendum)
Notified [place], NP via text page that pt's O2 sat on RA kept dropping to 85-86%. Placed pt on 1L of O2 via nasal cannula. Pt is now 93% on 1L of O2. Pt states that she doesn't feel short of breath and no chest pain. Will continue to monitor pt. Ranelle Oyster, RN

## 2014-06-17 DIAGNOSIS — J9610 Chronic respiratory failure, unspecified whether with hypoxia or hypercapnia: Secondary | ICD-10-CM

## 2014-06-17 DIAGNOSIS — E876 Hypokalemia: Secondary | ICD-10-CM

## 2014-06-17 DIAGNOSIS — I70219 Atherosclerosis of native arteries of extremities with intermittent claudication, unspecified extremity: Secondary | ICD-10-CM

## 2014-06-17 DIAGNOSIS — R946 Abnormal results of thyroid function studies: Secondary | ICD-10-CM

## 2014-06-17 DIAGNOSIS — R0902 Hypoxemia: Secondary | ICD-10-CM

## 2014-06-17 DIAGNOSIS — R7989 Other specified abnormal findings of blood chemistry: Secondary | ICD-10-CM

## 2014-06-17 LAB — BASIC METABOLIC PANEL
ANION GAP: 14 (ref 5–15)
BUN: 7 mg/dL (ref 6–23)
CALCIUM: 7.3 mg/dL — AB (ref 8.4–10.5)
CO2: 27 mEq/L (ref 19–32)
CREATININE: 0.74 mg/dL (ref 0.50–1.10)
Chloride: 103 mEq/L (ref 96–112)
GFR calc Af Amer: >90 mL/min (ref >90)
GFR calc non Af Amer: 85 mL/min — ABNORMAL LOW (ref >90)
Glucose, Bld: 84 mg/dL (ref 70–99)
Potassium: 3.4 mEq/L — ABNORMAL LOW (ref 3.7–5.3)
SODIUM: 144 meq/L (ref 137–147)

## 2014-06-17 LAB — APTT: APTT: 87 s — AB (ref 24–37)

## 2014-06-17 LAB — CBC
HCT: 38.4 % (ref 36.0–46.0)
Hemoglobin: 12.4 g/dL (ref 12.0–15.0)
MCH: 31.6 pg (ref 26.0–34.0)
MCHC: 32.3 g/dL (ref 30.0–36.0)
MCV: 97.7 fL (ref 78.0–100.0)
PLATELETS: 206 10*3/uL (ref 150–400)
RBC: 3.93 MIL/uL (ref 3.87–5.11)
RDW: 16.1 % — ABNORMAL HIGH (ref 11.5–15.5)
WBC: 5.3 10*3/uL (ref 4.0–10.5)

## 2014-06-17 LAB — HEPARIN LEVEL (UNFRACTIONATED): Heparin Unfractionated: 0.47 IU/mL (ref 0.30–0.70)

## 2014-06-17 LAB — TSH: TSH: 6.48 u[IU]/mL — ABNORMAL HIGH (ref 0.350–4.500)

## 2014-06-17 LAB — T4, FREE: FREE T4: 1.09 ng/dL (ref 0.80–1.80)

## 2014-06-17 MED ORDER — BOOST PLUS PO LIQD: 237.0000 mL | ORAL | Status: DC

## 2014-06-17 MED ORDER — RIVAROXABAN 15 MG PO TABS
15.0000 mg | ORAL_TABLET | Freq: Two times a day (BID) | ORAL | Status: DC
  Administered 2014-06-17: 15 mg via ORAL
  Filled 2014-06-17 (×3): qty 1

## 2014-06-17 MED ORDER — POTASSIUM CHLORIDE CRYS ER 20 MEQ PO TBCR
40.0000 meq | EXTENDED_RELEASE_TABLET | Freq: Once | ORAL | Status: AC
  Administered 2014-06-17: 40 meq via ORAL
  Filled 2014-06-17: qty 2

## 2014-06-17 NOTE — Progress Notes (Signed)
Doneen Poisson to be D/C'd Home per MD order.  Discussed with the patient and all questions fully answered.    Medication List    TAKE these medications       ADVIL 200 MG tablet  Generic drug:  ibuprofen  Take 400 mg by mouth every 6 (six) hours as needed for mild pain. For headache pain     aspirin 81 MG EC tablet  Take 81 mg by mouth daily.     escitalopram 10 MG tablet  Commonly known as:  LEXAPRO  Take 5 mg by mouth daily.     lactose free nutrition Liqd  Take 237 mLs by mouth daily.     mirtazapine 30 MG tablet  Commonly known as:  REMERON  Take 30 mg by mouth at bedtime.     MULTIVITAMINS Chew  Chew 1 tablet by mouth daily.     omeprazole 20 MG capsule  Commonly known as:  PRILOSEC  Take 20 mg by mouth daily.      ASK your doctor about these medications       Rivaroxaban 15 MG Tabs tablet  Commonly known as:  XARELTO  Take 15 mg by mouth 2 (two) times daily with a meal. Patient was taking for 21 days and was to then switch to 20mg  daily.  Ask about: Which instructions should I use?        VVS, Skin clean, dry and intact without evidence of skin break down, no evidence of skin tears noted. IV catheter discontinued intact. Site without signs and symptoms of complications. Dressing and pressure applied.  An After Visit Summary was printed and given to the patient.  D/c education completed with patient/family including follow up instructions, medication list, d/c activities limitations if indicated, with other d/c instructions as indicated by MD - patient able to verbalize understanding, all questions fully answered.   Patient instructed to return to ED, call 911, or call MD for any changes in condition.   Patient escorted via [place], and D/C home via private auto.  Delman Cheadle 06/17/2014 12:26 PM

## 2014-06-17 NOTE — Discharge Instructions (Signed)
Information on my medicine - XARELTO (rivaroxaban)  This medication education was reviewed with me or my healthcare representative as part of my discharge preparation.  The pharmacist that spoke with me during my hospital stay was:  Margorie Renner M, RPH  WHY WAS Van? Xarelto was prescribed to treat blood clots that may have been found in the veins of your legs (deep vein thrombosis) or in your lungs (pulmonary embolism) and to reduce the risk of them occurring again.  What do you need to know about Xarelto? The starting dose is one 15 mg tablet taken TWICE daily with food for the FIRST 21 DAYS then on (enter date)  07/08/14  the dose is changed to one 20 mg tablet taken ONCE A DAY with your evening meal.  DO NOT stop taking Xarelto without talking to the health care provider who prescribed the medication.  Refill your prescription for 20 mg tablets before you run out.  After discharge, you should have regular check-up appointments with your healthcare provider that is prescribing your Xarelto.  In the future your dose may need to be changed if your kidney function changes by a significant amount.  What do you do if you miss a dose? If you are taking Xarelto TWICE DAILY and you miss a dose, take it as soon as you remember. You may take two 15 mg tablets (total 30 mg) at the same time then resume your regularly scheduled 15 mg twice daily the next day.  If you are taking Xarelto ONCE DAILY and you miss a dose, take it as soon as you remember on the same day then continue your regularly scheduled once daily regimen the next day. Do not take two doses of Xarelto at the same time.   Important Safety Information Xarelto is a blood thinner medicine that can cause bleeding. You should call your healthcare provider right away if you experience any of the following:   Bleeding from an injury or your nose that does not stop.   Unusual colored urine (red or dark brown) or  unusual colored stools (red or black).   Unusual bruising for unknown reasons.   A serious fall or if you hit your head (even if there is no bleeding).  Some medicines may interact with Xarelto and might increase your risk of bleeding while on Xarelto. To help avoid this, consult your healthcare provider or pharmacist prior to using any new prescription or non-prescription medications, including herbals, vitamins, non-steroidal anti-inflammatory drugs (NSAIDs) and supplements.  This website has more information on Xarelto: [email or web address]

## 2014-06-17 NOTE — Progress Notes (Addendum)
ANTICOAGULATION CONSULT NOTE - Follow Up Consult  Pharmacy Consult for initiation of Xarelto Indication: pulmonary embolus and DVT  Allergies  Allergen Reactions  . Ciprofloxacin Hives    Patient Measurements: Height: 5\' 2"  (157.5 cm) Weight: 162 lb 8 oz (73.71 kg) IBW/kg (Calculated) : 50.1  Vital Signs: Temp: 98.5 F (36.9 C) (07/11 0417) Temp src: Oral (07/11 0417) BP: 111/71 mmHg (07/11 0417) Pulse Rate: 81 (07/11 0417)  Labs:  Recent Labs  06/15/14 1610  06/15/14 2141 06/16/14 0300 06/16/14 0400 06/16/14 1210 06/16/14 2104 06/17/14 0500  HGB 13.5  --   --   --  13.0  --   --  12.4  HCT 40.7  --   --   --  40.4  --   --  38.4  PLT 215  --   --   --  210  --   --  206  APTT  --   < > 38*  --  34 62* 108* 87*  HEPARINUNFRC  --   --  >2.20*  --  >2.20*  --   --  0.47  CREATININE 0.73  --   --  0.72  --   --   --  0.74  < > = values in this interval not displayed.  Estimated Creatinine Clearance: 63.2 ml/min (by C-G formula based on Cr of 0.74).  Medications:  Prescriptions prior to admission  Medication Sig Dispense Refill  . aspirin 81 MG EC tablet Take 81 mg by mouth daily.       [name] escitalopram (LEXAPRO) 10 MG tablet Take 5 mg by mouth daily.      [name] ibuprofen (ADVIL) 200 MG tablet Take 400 mg by mouth every 6 (six) hours as needed for mild pain. For headache pain      . mirtazapine (REMERON) 30 MG tablet Take 30 mg by mouth at bedtime.      . Multiple Vitamins-Minerals (MULTIVITAMINS) CHEW Chew 1 tablet by mouth daily.      [name] omeprazole (PRILOSEC) 20 MG capsule Take 20 mg by mouth daily.       Alveda Reasons 20 MG TABS tablet Take 20 mg by mouth daily.         Assessment: 68yo F admitted 06/15/14 for SOB. CTA showed PE. Heparin was started. Plan to discharge home on Xarelto today. Pharmacy consulted for dosing. Patient on Xarelto 20mg  daily at home. CrCl warrants treatment dose of 15mg  BID.   Goal of Therapy:  Treatment of PE/DVT Monitor platelets by  anticoagulation protocol: Yes   Plan:  Begin treatment dose of Xarelto 15mg  BID with food x 21 days, then 20mg  daily thereafter Patient education Monitor s/sx of bleeding  Thank you,  D'Arcy Abraha M. Lavone Barrientes, Pharm.D Clinical Pharmacy Resident Pager: [phone] 06/17/2014 .8:52 AM     After speaking with patient found out that she was taking 15mg  BID outpatient. Education with patient completed.

## 2014-06-17 NOTE — Progress Notes (Signed)
SATURATION QUALIFICATIONS: (This note is used to comply with regulatory documentation for home oxygen)  Patient Saturations on Room Air at Rest = 87%  Patient Saturations on Room Air while Ambulating = 78%  Patient Saturations on 4 Liters of oxygen while Ambulating = 90%  Please briefly explain why patient needs home oxygen: patient gets short of breath and Sp02 levels below 88% on room air at rest and while ambulating

## 2014-06-17 NOTE — Discharge Summary (Addendum)
Physician Discharge Summary  Theresa [name]:[id number] DOB: August 23, 1946 DOA: 06/15/2014  PCP:  Melinda Crutch, MD  Admit date: 06/15/2014 Discharge date: 06/17/2014  Recommendations for Outpatient Follow-up:  1. Continue xarelto and follow up with Dr. Julien Nordmann in 2 weeks to review pending hypercoagulable panel 2. PCP in 2-4 weeks.  F/u fT4 and repeat TFTs.   3. Home RN and oxygen 4L [place] at rest and with exertion  Discharge Diagnoses:  Principal Problem:   Pulmonary embolus Active Problems:   ADENOCARCINOMA, BREAST   Acute respiratory failure with hypoxia   Chronic respiratory failure   Elevated TSH   Hypokalemia   Discharge Condition: Stable, improved  Diet recommendation: Healthy heart  Wt Readings from Last 3 Encounters:  06/15/14 73.71 kg (162 lb 8 oz)  03/01/14 75.841 kg (167 lb 3.2 oz)  10/26/13 72.984 kg (160 lb 14.4 oz)    History of present illness:  The patient is a 68 year old female who was recently diagnosed with DVT by her primary care doctor. She was started on Xarelto. She has history of breast cancer which was stage I and treated with hormone therapy which ended in 2012. Her daughter also has problems with VTE and had to take Lovenox during pregnancy. Her grandson also has had some problems with blood issues. She presented with shortness of breath and was found to have a pulmonary embolism.  Hospital Course:   Acute on chronic hypoxic respiratory failure secondary to DVT with resultant pulmonary embolism. She presented with shortness of breath despite being compliant with Xarelto.  She was found to have a pulmonary embolism. Her CT of the chest suggested right heart strain. She was started on a heparin infusion and had a 2-D echo. Her echocardiogram demonstrated preserved ejection fraction, normal right ventricular size and function, no evidence of strain. He her peak PA pressure was 44 mmHg. She was transitioned back to Xarelto at the recommendation of her  hematology/oncology physician. She had a hypercoagulable panel sent.    Cardiolipin antibody, pending Prothrombin gene, pending Factor V Leiden, pending Homocysteine, 21.4, high Beta-2 glycoprotein, pending Lupus anticoagulant, pending Routine S. total, pending Routine S. activity, pending Protein C total, pending Protein C activity, pending Anti-thrombin III function, 50, low  Mildly elevated TSH, likely sick euthyroid in the setting of acute pulmonary embolism -  Followup free T3 and free T4  PVD, continue aspirin.  History of breast cancer, in remission.  GERD, stable, continue PPI  Depression, stable, continue Lexapro and Remeron  Hypokalemia due to poor oral intake, resolved with oral supplementation  Hypoalbuminemia, acute phase reactant, started on boost supplements and encouraged to use daily supplements at home.  Consultants:  None  Procedures:  CXR CT angio chest  2-D echo Antibiotics:  None    Discharge Exam: Filed Vitals:   06/17/14 0417  BP: 111/71  Pulse: 81  Temp: 98.5 F (36.9 C)  Resp: 18   Filed Vitals:   06/16/14 2115 06/16/14 2230 06/16/14 2235 06/17/14 0417  BP:    111/71  Pulse:    81  Temp:    98.5 F (36.9 C)  TempSrc:    Oral  Resp:    18  Height:      Weight:      SpO2: 93% 88% 94% 98%    General: CF, NAD, nasal canula in place HEENT:  NCAT, MMM Cardiovascular: RRR, no mrg, 2+ pulses Respiratory: CTAB, no increased WOB at rest ABD:  NABS, soft, mildly distended, nontender MSK;  1+ RLE pitting edema, no LLE edema, normal tone and bulk  Discharge Instructions      Discharge Instructions   Call MD for:  difficulty breathing, headache or visual disturbances    Complete by:  As directed      Call MD for:  extreme fatigue    Complete by:  As directed      Call MD for:  hives    Complete by:  As directed      Call MD for:  persistant dizziness or light-headedness    Complete by:  As directed      Call MD for:   persistant nausea and vomiting    Complete by:  As directed      Call MD for:  severe uncontrolled pain    Complete by:  As directed      Call MD for:  temperature >100.4    Complete by:  As directed      Diet - low sodium heart healthy    Complete by:  As directed      Discharge instructions    Complete by:  As directed   You were hospitalized with pulmonary embolism.  Please resume your xarelto 15mg  twice daily for the next three weeks as previously prescribed and then start once daily xarelto 20mg  once daily thereafter.  Please use your home oxygen continuously, particularly when you are up and moving around.  Follow up with Dr. Julien Nordmann in 2 weeks to review your pending blood work.  Follow up with your primary care doctor in 2-4 weeks to repeat your thyroid tests and follow up on any additional blood work.     Increase activity slowly    Complete by:  As directed             Medication List         ADVIL 200 MG tablet  Generic drug:  ibuprofen  Take 400 mg by mouth every 6 (six) hours as needed for mild pain. For headache pain     aspirin 81 MG EC tablet  Take 81 mg by mouth daily.     escitalopram 10 MG tablet  Commonly known as:  LEXAPRO  Take 5 mg by mouth daily.     lactose free nutrition Liqd  Take 237 mLs by mouth daily.     mirtazapine 30 MG tablet  Commonly known as:  REMERON  Take 30 mg by mouth at bedtime.     MULTIVITAMINS Chew  Chew 1 tablet by mouth daily.     omeprazole 20 MG capsule  Commonly known as:  PRILOSEC  Take 20 mg by mouth daily.     Rivaroxaban 15 MG Tabs tablet  Commonly known as:  XARELTO  Take 15 mg by mouth 2 (two) times daily with a meal. Patient was taking for 21 days and was to then switch to 20mg  daily.       Follow-up Information   Follow up with [facility] K., MD. Schedule an appointment as soon as possible for a visit in 2 weeks. (f/u hypercoagulable labs)    Specialty:  Oncology   Contact information:   [place] [address] [phone]       Follow up with  Melinda Crutch, MD. Schedule an appointment as soon as possible for a visit in 2 weeks.   Specialty:  Family Medicine   Contact information:   [address] [place] [address] [phone]  Follow up with [facility]. (Oxygen)    Contact information:   [address] [place] [address] [phone]        The results of significant diagnostics from this hospitalization (including imaging, microbiology, ancillary and laboratory) are listed below for reference.    Significant Diagnostic Studies: Dg Chest 2 View  06/15/2014   CLINICAL DATA:  Shortness of breath since last week. Blood clot in right leg.  EXAM: CHEST  2 VIEW  COMPARISON:  02/27/2014.  FINDINGS: The heart size and mediastinal contours are stable. There is stable mild biapical pleural thickening. The lungs are clear. There is no pleural effusion or pneumothorax. There are stable degenerative changes in the spine associated with a mild convex right scoliosis. Cholecystectomy clips are noted.  IMPRESSION: No active cardiopulmonary process.   Electronically Signed   By: Camie Patience M.D.   On: 06/15/2014 18:23   Ct Angio Chest Pe W/cm &/or Wo Cm  06/15/2014   CLINICAL DATA:  Known deep venous thrombosis; now hypoxic and dyspnea on exertion  EXAM: CT ANGIOGRAPHY CHEST WITH CONTRAST  TECHNIQUE: Multidetector CT imaging of the chest was performed using the standard protocol during bolus administration of intravenous contrast. Multiplanar CT image reconstructions and MIPs were obtained to evaluate the vascular anatomy.  CONTRAST:  164mL OMNIPAQUE IOHEXOL 350 MG/ML SOLN intravenously  COMPARISON:  PA and lateral chest x-ray of today's date.  FINDINGS: There are multiple filling defects within mid and peripheral branches branches of the right and left pulmonary arteries. No central thrombus is demonstrated. The RV -LV ratio is approximately  1. The cardiac chambers are top-normal in size. The caliber of the thoracic aorta is normal. There is no false lumen. There is no lymphadenopathy nor pleural or pericardial effusion.  The lungs are well-expanded. Subpleural increased interstitial density is present posteriorly in both lungs. There is no alveolar infiltrate.  The observed portions of the liver and spleen exhibit no acute abnormality. There is likely fatty infiltration of the liver. The bony thorax exhibits no acute abnormality.  Review of the MIP images confirms the above findings.  IMPRESSION: 1. There are multi focal bilateral pulmonary emboli . There is no evidence of pulmonary infarction or pleural effusion. Positive for acute PE with CT evidence of right heartstrain (RV/LV Ratio = 1.0) consistent with at least submassive (intermediate risk)PE. The presence of right heart strain has been associated with an increased risk of morbidity and mortality. Consultation with Pulmonary and Critical Care Medicine is recommended. 2. There is borderline cardiomegaly. There is minimal compressive atelectasis in both lungs. No pulmonary parenchymal nodules or masses are demonstrated. 3. Critical Value/emergent results were called by telephone at the time of interpretation on 06/15/2014 at 6:58 PM to Dr. Margarita Mail , who verbally acknowledged these results.   Electronically Signed   By: David  Martinique   On: 06/15/2014 19:01    Microbiology: No results found for this or any previous visit (from the past 240 hour(s)).   Labs: Basic Metabolic Panel:  Recent Labs Lab 06/15/14 1610 06/16/14 0300 06/17/14 0500  NA 146 146 144  K 3.8 3.7 3.4*  CL 106 104 103  CO2 25 28 27   GLUCOSE 113* 117* 84  BUN 8 7 7   CREATININE 0.73 0.72 0.74  CALCIUM 8.1* 7.9* 7.3*   Liver Function Tests:  Recent Labs Lab 06/15/14 1610 06/16/14 0300  AST 32 27  ALT 21 19  ALKPHOS 102 98  BILITOT 0.5  0.7  PROT 6.5 6.1  ALBUMIN 2.9* 2.7*   No results found for  this basename: LIPASE, AMYLASE,  in the last 168 hours No results found for this basename: AMMONIA,  in the last 168 hours CBC:  Recent Labs Lab 06/15/14 1610 06/16/14 0400 06/17/14 0500  WBC 8.4 9.5 5.3  HGB 13.5 13.0 12.4  HCT 40.7 40.4 38.4  MCV 96.4 97.3 97.7  PLT 215 210 206   Cardiac Enzymes: No results found for this basename: CKTOTAL, CKMB, CKMBINDEX, TROPONINI,  in the last 168 hours BNP: BNP (last 3 results)  Recent Labs  06/15/14 1610  PROBNP 306.1*   CBG: No results found for this basename: GLUCAP,  in the last 168 hours  Time coordinating discharge: 45 minutes  Signed:  Pius Byrom  [facility] 06/17/2014, 12:32 PM

## 2014-06-19 LAB — FACTOR 5 LEIDEN

## 2014-06-19 LAB — LUPUS ANTICOAGULANT PANEL
DRVVT CONFIRMATION: 0.61 ratio (ref <1.15)
DRVVT: 62.1 s — AB (ref <42.9)
LUPUS ANTICOAGULANT: NOT DETECTED
PTT Lupus Anticoagulant: 50.5 secs — ABNORMAL HIGH (ref 28.0–43.0)
PTTLA 4:1 Mix: 45.5 secs — ABNORMAL HIGH (ref 28.0–43.0)
PTTLA Confirmation: 6.6 secs (ref <8.0)
dRVVT Incubated 1:1 Mix: 46.3 secs — ABNORMAL HIGH (ref <42.9)

## 2014-06-19 LAB — PROTEIN C, TOTAL: Protein C, Total: 44 % — ABNORMAL LOW (ref 72–160)

## 2014-06-19 LAB — CARDIOLIPIN ANTIBODIES, IGG, IGM, IGA
ANTICARDIOLIPIN IGG: 4 GPL U/mL — AB (ref <23)
ANTICARDIOLIPIN IGM: 5 [MPL'U]/mL — AB (ref <11)
Anticardiolipin IgA: 5 APL U/mL — ABNORMAL LOW (ref <22)

## 2014-06-19 LAB — PROTHROMBIN GENE MUTATION

## 2014-06-19 LAB — PROTEIN S, TOTAL: Protein S Ag, Total: 173 % — ABNORMAL HIGH (ref 60–150)

## 2014-06-19 LAB — BETA-2-GLYCOPROTEIN I ABS, IGG/M/A
BETA 2 GLYCO I IGG: 0 G Units (ref <20)
Beta-2-Glycoprotein I IgA: 11 A Units (ref <20)
Beta-2-Glycoprotein I IgM: 3 M Units (ref <20)

## 2014-06-19 LAB — PROTEIN C ACTIVITY: PROTEIN C ACTIVITY: 85 % (ref 75–133)

## 2014-06-19 LAB — PROTEIN S ACTIVITY: Protein S Activity: 200 % — ABNORMAL HIGH (ref 69–129)

## 2014-06-20 ENCOUNTER — Other Ambulatory Visit: Payer: Self-pay | Admitting: Medical Oncology

## 2014-06-20 NOTE — ED Provider Notes (Signed)
See prior note   Janice Norrie, MD 06/20/14 1040

## 2014-06-21 ENCOUNTER — Telehealth: Payer: Self-pay | Admitting: Medical Oncology

## 2014-06-21 DIAGNOSIS — C50919 Malignant neoplasm of unspecified site of unspecified female breast: Secondary | ICD-10-CM

## 2014-06-21 DIAGNOSIS — Z86711 Personal history of pulmonary embolism: Secondary | ICD-10-CM

## 2014-06-21 NOTE — Telephone Encounter (Signed)
Pt notified.She wants to f/u with Dr Julien Nordmann. Onc tx request sent. Pt  told me she is going to talk  to her PCP regarding home equipment.

## 2014-06-21 NOTE — Telephone Encounter (Signed)
Message copied by Ardeen Garland on Wed Jun 21, 2014  8:50 AM ------      Message from: Curt Bears      Created: Tue Jun 20, 2014  7:38 PM       2-3 weeks or follow up with PCP for the blood clots.      ----- Message -----         From: Ardeen Garland, RN         Sent: 06/20/2014   3:08 PM           To: Curt Bears, MD            Was told to f/u with you after recetn discharge Wants to know when she should see you -has blood clots in lungs and lleg.        ------

## 2014-06-23 ENCOUNTER — Telehealth: Payer: Self-pay | Admitting: Internal Medicine

## 2014-06-23 NOTE — Telephone Encounter (Signed)
s.w. pt and advised on Aug appt....pt ok and aware °

## 2014-07-12 ENCOUNTER — Ambulatory Visit (HOSPITAL_BASED_OUTPATIENT_CLINIC_OR_DEPARTMENT_OTHER): Payer: Medicare Other | Admitting: Internal Medicine

## 2014-07-12 ENCOUNTER — Encounter: Payer: Self-pay | Admitting: Internal Medicine

## 2014-07-12 ENCOUNTER — Telehealth: Payer: Self-pay | Admitting: Internal Medicine

## 2014-07-12 ENCOUNTER — Other Ambulatory Visit (HOSPITAL_BASED_OUTPATIENT_CLINIC_OR_DEPARTMENT_OTHER): Payer: Medicare Other

## 2014-07-12 VITALS — BP 118/82 | HR 116 | Temp 98.2°F | Resp 18 | Ht 62.0 in | Wt 165.0 lb

## 2014-07-12 DIAGNOSIS — I2699 Other pulmonary embolism without acute cor pulmonale: Secondary | ICD-10-CM

## 2014-07-12 DIAGNOSIS — Z853 Personal history of malignant neoplasm of breast: Secondary | ICD-10-CM

## 2014-07-12 DIAGNOSIS — C50919 Malignant neoplasm of unspecified site of unspecified female breast: Secondary | ICD-10-CM

## 2014-07-12 DIAGNOSIS — Z86711 Personal history of pulmonary embolism: Secondary | ICD-10-CM

## 2014-07-12 DIAGNOSIS — I82409 Acute embolism and thrombosis of unspecified deep veins of unspecified lower extremity: Secondary | ICD-10-CM

## 2014-07-12 LAB — CBC WITH DIFFERENTIAL/PLATELET
BASO%: 1 % (ref 0.0–2.0)
BASOS ABS: 0.1 10*3/uL (ref 0.0–0.1)
EOS ABS: 0.2 10*3/uL (ref 0.0–0.5)
EOS%: 2.2 % (ref 0.0–7.0)
HCT: 44.3 % (ref 34.8–46.6)
HEMOGLOBIN: 14.2 g/dL (ref 11.6–15.9)
LYMPH%: 24.5 % (ref 14.0–49.7)
MCH: 31.5 pg (ref 25.1–34.0)
MCHC: 32.1 g/dL (ref 31.5–36.0)
MCV: 98.2 fL (ref 79.5–101.0)
MONO#: 1 10*3/uL — ABNORMAL HIGH (ref 0.1–0.9)
MONO%: 10.8 % (ref 0.0–14.0)
NEUT#: 5.9 10*3/uL (ref 1.5–6.5)
NEUT%: 61.5 % (ref 38.4–76.8)
PLATELETS: 284 10*3/uL (ref 145–400)
RBC: 4.51 10*6/uL (ref 3.70–5.45)
RDW: 15.5 % — AB (ref 11.2–14.5)
WBC: 9.6 10*3/uL (ref 3.9–10.3)
lymph#: 2.3 10*3/uL (ref 0.9–3.3)

## 2014-07-12 LAB — COMPREHENSIVE METABOLIC PANEL (CC13)
ALK PHOS: 80 U/L (ref 40–150)
ALT: 26 U/L (ref 0–55)
AST: 37 U/L — ABNORMAL HIGH (ref 5–34)
Albumin: 3.2 g/dL — ABNORMAL LOW (ref 3.5–5.0)
Anion Gap: 10 mEq/L (ref 3–11)
BUN: 8.9 mg/dL (ref 7.0–26.0)
CALCIUM: 9 mg/dL (ref 8.4–10.4)
CO2: 26 mEq/L (ref 22–29)
Chloride: 106 mEq/L (ref 98–109)
Creatinine: 0.8 mg/dL (ref 0.6–1.1)
GLUCOSE: 113 mg/dL (ref 70–140)
Potassium: 3.8 mEq/L (ref 3.5–5.1)
Sodium: 141 mEq/L (ref 136–145)
Total Bilirubin: 0.56 mg/dL (ref 0.20–1.20)
Total Protein: 7.1 g/dL (ref 6.4–8.3)

## 2014-07-12 NOTE — Telephone Encounter (Signed)
gv and printed appts sched adn av sfor pt for NOV

## 2014-07-12 NOTE — Progress Notes (Signed)
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  2) bilateral pulmonary embolism with right deep venous thrombosis.  PRIOR THERAPY:  1. Status post right breast lumpectomy with sentinel lymph node biopsies on January 08, 2001, revealing a 1.2 cm infiltrating ductal carcinoma. The tumor was positive for estrogen and progesterone receptors and negative for HER-2/neu.  2. Status post postoperative radiotherapy to the right breast.  3. Arimidex 1 mg p.o. daily, started May 2002, completed 2012.  CURRENT THERAPY: Xarelto 15 mg by mouth twice a day.   INTERVAL HISTORY: Theresa [name] 68 y.o. female returns to the clinic today for followup visit accompanied by her husband. The patient has been complaining of swelling of the right lower extremity since June of 2015 and was getting worse. She had a BX of the right lower extremity by her primary care physician and it showed deep venous thrombosis of the right lower extremity. She was started on Xarelto. She was also complaining of shortness of breath and CT angiogram of the chest was performed on 06/15/2014 and it showed multifocal bilateral pulmonary emboli. There is no evidence of pulmonary infarction or pleural effusion. The scan was positive for acute pulmonary embolism no CT evidence of right heart strain consistent with at least massive pulmonary embolus. She is feeling fine today except for the persistent swelling of the right lower extremity. She is currently on home oxygen. She denied having any significant chest pain but has shortness of breath with exertion with no cough or hemoptysis. The patient denied having any bleeding issues. She has a hypercoagulable panel performed during her hospitalization on 06/15/2014 and she  is here for evaluation and discussion of her lab results and recommendation regarding her anticoagulation.  MEDICAL HISTORY: Past Medical History  Diagnosis Date  . Stricture and stenosis of esophagus   . Other and unspecified hyperlipidemia   . Dysthymic disorder   . Other and unspecified coagulation defects   . Malignant neoplasm of breast (female), unspecified site   . Diaphragmatic hernia without mention of obstruction or gangrene   . PAD (peripheral artery disease)   . Colitis, ulcerative     Left sided  . PVD (peripheral vascular disease)     ALLERGIES:  is allergic to ciprofloxacin.  MEDICATIONS:  Current Outpatient Prescriptions  Medication Sig Dispense Refill  . aspirin 81 MG EC tablet Take 81 mg by mouth daily.       [name] escitalopram (LEXAPRO) 10 MG tablet Take 5 mg by mouth daily.      [name] ibuprofen (ADVIL) 200 MG tablet Take 400 mg by mouth every 6 (six) hours as needed for mild pain. For headache pain      . lactose free nutrition (BOOST PLUS) LIQD Take 237 mLs by mouth daily.  30 Can  0  . mirtazapine (REMERON) 30 MG tablet Take 30 mg by mouth at bedtime.      . Multiple Vitamins-Minerals (MULTIVITAMINS) CHEW Chew 1 tablet by mouth daily.      [name] omeprazole (PRILOSEC) 20 MG capsule Take 20 mg by mouth daily.       . Rivaroxaban (XARELTO) 15 MG TABS tablet Take 15 mg by mouth 2 (two) times daily with a meal. Patient was taking for 21 days and was to then  switch to 36m daily.      . [DISCONTINUED] lansoprazole (PREVACID) 30 MG capsule Take 30 mg by mouth daily.        . [DISCONTINUED] metoCLOPramide (REGLAN) 10 MG tablet Take one tab one half hour before meals and at bedtime  40 tablet  3   No current facility-administered medications for this visit.    SURGICAL HISTORY:  Past Surgical History  Procedure Laterality Date  . Cholecystectomy    . Appendectomy    . Tubal ligation    . Breast lumpectomy      Right    REVIEW OF SYSTEMS:  Constitutional: negative Eyes:  negative Ears, nose, mouth, throat, and face: negative Respiratory: positive for dyspnea on exertion Cardiovascular: negative Gastrointestinal: negative Genitourinary:negative Integument/breast: negative Hematologic/lymphatic: negative Musculoskeletal:negative Neurological: negative Behavioral/Psych: negative Endocrine: negative Allergic/Immunologic: negative   PHYSICAL EXAMINATION: General appearance: alert, cooperative and no distress Head: Normocephalic, without obvious abnormality, atraumatic Neck: no adenopathy, no JVD, supple, symmetrical, trachea midline and thyroid not enlarged, symmetric, no tenderness/mass/nodules Lymph nodes: Cervical, supraclavicular, and axillary nodes normal. Resp: clear to auscultation bilaterally Back: symmetric, no curvature. ROM normal. No CVA tenderness. Cardio: regular rate and rhythm, S1, S2 normal, no murmur, click, rub or gallop GI: soft, non-tender; bowel sounds normal; no masses,  no organomegaly Extremities: edema 1+ right lower extremity Neurologic: Alert and oriented X 3, normal strength and tone. Normal symmetric reflexes. Normal coordination and gait   ECOG PERFORMANCE STATUS: 0 - Asymptomatic  Blood pressure 118/82, pulse 116, temperature 98.2 F (36.8 C), temperature source Oral, resp. rate 18, height _0  (1.575 m), weight 165 lb (74.844 kg), SpO2 94.00%.  LABORATORY DATA: Lab Results  Component Value Date   WBC 9.6 07/12/2014   HGB 14.2 07/12/2014   HCT 44.3 07/12/2014   MCV 98.2 07/12/2014   PLT 284 07/12/2014      Chemistry      Component Value Date/Time   NA 144 06/17/2014 0500   NA 146* 02/27/2014 1341   K 3.4* 06/17/2014 0500   K 3.8 02/27/2014 1341   CL 103 06/17/2014 0500   CL 104 03/02/2013 1531   CO2 27 06/17/2014 0500   CO2 26 02/27/2014 1341   BUN 7 06/17/2014 0500   BUN 13.2 02/27/2014 1341   CREATININE 0.74 06/17/2014 0500   CREATININE 0.9 02/27/2014 1341      Component Value Date/Time   CALCIUM 7.3* 06/17/2014 0500     CALCIUM 9.8 02/27/2014 1341   ALKPHOS 98 06/16/2014 0300   ALKPHOS 87 02/27/2014 1341   AST 27 06/16/2014 0300   AST 44* 02/27/2014 1341   ALT 19 06/16/2014 0300   ALT 34 02/27/2014 1341   BILITOT 0.7 06/16/2014 0300   BILITOT 0.53 02/27/2014 1341       RADIOGRAPHIC STUDIES: Dg Chest 2 View  06/15/2014   CLINICAL DATA:  Shortness of breath since last week. Blood clot in right leg.  EXAM: CHEST  2 VIEW  COMPARISON:  02/27/2014.  FINDINGS: The heart size and mediastinal contours are stable. There is stable mild biapical pleural thickening. The lungs are clear. There is no pleural effusion or pneumothorax. There are stable degenerative changes in the spine associated with a mild convex right scoliosis. Cholecystectomy clips are noted.  IMPRESSION: No active cardiopulmonary process.   Electronically Signed   By: BCamie PatienceM.D.   On: 06/15/2014 18:23   Ct Angio Chest Pe W/cm &/or Wo Cm  06/15/2014   CLINICAL DATA:  Known  deep venous thrombosis; now hypoxic and dyspnea on exertion  EXAM: CT ANGIOGRAPHY CHEST WITH CONTRAST  TECHNIQUE: Multidetector CT imaging of the chest was performed using the standard protocol during bolus administration of intravenous contrast. Multiplanar CT image reconstructions and MIPs were obtained to evaluate the vascular anatomy.  CONTRAST:  186m OMNIPAQUE IOHEXOL 350 MG/ML SOLN intravenously  COMPARISON:  PA and lateral chest x-ray of today's date.  FINDINGS: There are multiple filling defects within mid and peripheral branches branches of the right and left pulmonary arteries. No central thrombus is demonstrated. The RV -LV ratio is approximately 1. The cardiac chambers are top-normal in size. The caliber of the thoracic aorta is normal. There is no false lumen. There is no lymphadenopathy nor pleural or pericardial effusion.  The lungs are well-expanded. Subpleural increased interstitial density is present posteriorly in both lungs. There is no alveolar infiltrate.  The observed  portions of the liver and spleen exhibit no acute abnormality. There is likely fatty infiltration of the liver. The bony thorax exhibits no acute abnormality.  Review of the MIP images confirms the above findings.  IMPRESSION: 1. There are multi focal bilateral pulmonary emboli . There is no evidence of pulmonary infarction or pleural effusion. Positive for acute PE with CT evidence of right heartstrain (RV/LV Ratio = 1.0) consistent with at least submassive (intermediate risk)PE. The presence of right heart strain has been associated with an increased risk of morbidity and mortality. Consultation with Pulmonary and Critical Care Medicine is recommended. 2. There is borderline cardiomegaly. There is minimal compressive atelectasis in both lungs. No pulmonary parenchymal nodules or masses are demonstrated. 3. Critical Value/emergent results were called by telephone at the time of interpretation on 06/15/2014 at 6:58 PM to Dr. AMargarita Mail, who verbally acknowledged these results.   Electronically Signed   By: David  JMartinique  On: 06/15/2014 19:01   ASSESSMENT AND PLAN: This is a very pleasant 68years old white female with:   1) history of stage I right breast infiltrating ductal carcinoma status post surgical resection followed by postoperative radiotherapy and completion of 10 years of hormonal therapy with Arimidex.  The patient is currently on observation with no evidence for disease recurrence on the recent blood or imaging studies. 2) acute venous thrombosis with pulmonary embolus: The patient has some hypercoagulable abnormality on the recent blood work including low total protein C 44 but normal protein C activity. She also has low Antithrombin III of 50.  I have a lengthy discussion with the patient and her husband today about her current condition. I'm not sure if she has a pre-existing low total protein C. and anti-Thrombin III or this was a result of her recent of her recent deep venous thrombosis  and pulmonary embolism. I recommended for the patient to continue her current treatment with Xarelto at a dose of 20 mg total daily after the first initiate 3 weeks of her treatment with twice a day dose. She needs to stay on treatment for at least one year. I will order repeat hypercoagulable panel in 3 months and if these abnormalities persisted, the patient would need to be on anticoagulation for life. I would see her for followup visit in 3 months for reevaluation and more detailed discussion of her treatment options. She was advised to call immediately if she has any concerning symptoms in the interval.  All questions were answered. The patient knows to call the clinic with any problems, questions or concerns. We can certainly  see the patient much sooner if necessary.  Disclaimer: This note was dictated with voice recognition software. Similar sounding words can inadvertently be transcribed and may not be corrected upon review.

## 2014-08-10 ENCOUNTER — Other Ambulatory Visit: Payer: Self-pay

## 2014-08-10 ENCOUNTER — Telehealth: Payer: Self-pay | Admitting: Gastroenterology

## 2014-08-10 MED ORDER — ONDANSETRON HCL 4 MG PO TABS: 4.0000 mg | ORAL_TABLET | Freq: Three times a day (TID) | ORAL | Status: DC | PRN

## 2014-08-10 NOTE — Telephone Encounter (Signed)
Spoke with the patient. She wants to refill Zofran. She has been having nausea and she is now going on a boat trip. She was seen in the past for nausea. I refilled the medication. She will call for an appointment when she comes back from her trip.

## 2014-08-11 NOTE — Telephone Encounter (Signed)
ok 

## 2014-08-18 ENCOUNTER — Ambulatory Visit (INDEPENDENT_AMBULATORY_CARE_PROVIDER_SITE_OTHER): Payer: Medicare Other | Admitting: Emergency Medicine

## 2014-08-18 ENCOUNTER — Encounter: Payer: Self-pay | Admitting: Emergency Medicine

## 2014-08-18 VITALS — BP 140/100 | HR 121 | Temp 97.8°F | Ht 62.0 in | Wt 169.4 lb

## 2014-08-18 DIAGNOSIS — I2699 Other pulmonary embolism without acute cor pulmonale: Secondary | ICD-10-CM

## 2014-08-18 DIAGNOSIS — I70219 Atherosclerosis of native arteries of extremities with intermittent claudication, unspecified extremity: Secondary | ICD-10-CM

## 2014-08-18 DIAGNOSIS — J9601 Acute respiratory failure with hypoxia: Secondary | ICD-10-CM

## 2014-08-18 DIAGNOSIS — J9600 Acute respiratory failure, unspecified whether with hypoxia or hypercapnia: Secondary | ICD-10-CM

## 2014-08-18 NOTE — Patient Instructions (Signed)
Please continue your xarelto as you have been taking it. You likely will need to be on a blood thinning medication for life.  Wear your oxygen with all exercise with a goal to keep your SpO2 > 90% on your pulse oximeter.  Follow with Dr Julien Nordmann regarding them inherited risk factors for blood clot formation.  We will perform an overnight oximetry test to see if you need your oxygen at night Follow with Dr Lamonte Sakai as needed, especially if you have a change in your shortness of breath.

## 2014-08-18 NOTE — Progress Notes (Signed)
Subjective:    Patient ID: Theresa [name], female    DOB: [birth year], 68 y.o.   MRN: [id number]  HPI 68 yo former smoker, hx of Stage 1 breast CA ('02) and a recently dx R LE DVT + PE in 06/2014. She was started on xarelto for therapeutic anticoagulation.  She underwent a hypercoag panel in the setting of her clot and rx, low Prot C and ATIII levels (significance unclear). She is referred by Dr Harrington Challenger for dyspnea and hypoxemia.  She has not been wearing her O2 with all exertion.  She feels a bit better but still gets winded with exertion.   Review of Systems  Constitutional: Negative for fever and unexpected weight change.  HENT: Negative for congestion, dental problem, ear pain, nosebleeds, postnasal drip, rhinorrhea, sinus pressure, sneezing, sore throat and trouble swallowing.   Eyes: Negative for redness and itching.  Respiratory: Positive for shortness of breath. Negative for cough, chest tightness and wheezing.   Cardiovascular: Negative for palpitations and leg swelling.  Gastrointestinal: Negative for nausea and vomiting.       Acid heartburn/indigestion   Genitourinary: Negative for dysuria.  Musculoskeletal: Positive for joint swelling.  Skin: Negative for rash.  Neurological: Negative for headaches.  Hematological: Does not bruise/bleed easily.  Psychiatric/Behavioral: Positive for dysphoric mood. The patient is nervous/anxious.      Past Medical History  Diagnosis Date  . Stricture and stenosis of esophagus   . Other and unspecified hyperlipidemia   . Dysthymic disorder   . Other and unspecified coagulation defects   . Malignant neoplasm of breast (female), unspecified site   . Diaphragmatic hernia without mention of obstruction or gangrene   . PAD (peripheral artery disease)   . Colitis, ulcerative     Left sided  . PVD (peripheral vascular disease)      Family History  Problem Relation Age of Onset  . Heart disease Father   . Heart attack Father   . Colon  cancer Neg Hx   . Lung cancer Maternal Grandfather   . Deep vein thrombosis Daughter      History   Social History  . Marital Status: Married    Spouse Name: N/A    Number of Children: 2  . Years of Education: N/A   Occupational History  . Unemployed    Social History Main Topics  . Smoking status: Former Smoker    Quit date: 12/08/2002  . Smokeless tobacco: Never Used  . Alcohol Use: 7.0 oz/week    14 drink(s) per week     Comment: 2 drinks per night  . Drug Use: No  . Sexual Activity: Yes    Birth Control/ Protection: None   Other Topics Concern  . Not on file   Social History Narrative  . No narrative on file     Allergies  Allergen Reactions  . Ciprofloxacin Hives     Outpatient Prescriptions Prior to Visit  Medication Sig Dispense Refill  . aspirin 81 MG EC tablet Take 81 mg by mouth daily.       [name] escitalopram (LEXAPRO) 10 MG tablet Take 5 mg by mouth daily.      [name] ibuprofen (ADVIL) 200 MG tablet Take 400 mg by mouth every 6 (six) hours as needed for mild pain. For headache pain      . lactose free nutrition (BOOST PLUS) LIQD Take 237 mLs by mouth daily.  30 Can  0  . mirtazapine (REMERON) 30 MG tablet Take 30  mg by mouth at bedtime.      . Multiple Vitamins-Minerals (MULTIVITAMINS) CHEW Chew 1 tablet by mouth daily.      [name] omeprazole (PRILOSEC) 20 MG capsule Take 20 mg by mouth daily.       . ondansetron (ZOFRAN) 4 MG tablet Take 1 tablet (4 mg total) by mouth every 8 (eight) hours as needed for nausea or vomiting.  30 tablet  0  . Rivaroxaban (XARELTO) 15 MG TABS tablet Take 15 mg by mouth 2 (two) times daily with a meal. Patient was taking for 21 days and was to then switch to 20mg  daily.       No facility-administered medications prior to visit.        Objective:   Physical Exam Filed Vitals:   08/18/14 1435  BP: 140/100  Pulse: 121  Temp: 97.8 F (36.6 C)  TempSrc: Oral  Height: 5\' 2"  (1.575 m)  Weight: 169 lb 6.4 oz (76.839 kg)  SpO2: 90%    Gen: Pleasant, well-nourished, in no distress,  normal affect  ENT: No lesions,  mouth clear,  oropharynx clear, no postnasal drip  Neck: No JVD, no TMG, no carotid bruits  Lungs: No use of accessory muscles, no dullness to percussion, clear without rales or rhonchi  Cardiovascular: RRR, heart sounds normal, no murmur or gallops, no peripheral edema  Musculoskeletal: No deformities, no cyanosis or clubbing  Neuro: alert, non focal  Skin: Warm, no lesions or rashes     06/15/14 CT chest  COMPARISON: PA and lateral chest x-ray of today's date.  FINDINGS:  There are multiple filling defects within mid and peripheral  branches branches of the right and left pulmonary arteries. No  central thrombus is demonstrated. The RV -LV ratio is approximately  1. The cardiac chambers are top-normal in size. The caliber of the  thoracic aorta is normal. There is no false lumen. There is no  lymphadenopathy nor pleural or pericardial effusion.  The lungs are well-expanded. Subpleural increased interstitial  density is present posteriorly in both lungs. There is no alveolar  infiltrate.  The observed portions of the liver and spleen exhibit no acute  abnormality. There is likely fatty infiltration of the liver. The  bony thorax exhibits no acute abnormality.  Review of the MIP images confirms the above findings.  IMPRESSION:  1. There are multi focal bilateral pulmonary emboli . There is no  evidence of pulmonary infarction or pleural effusion. Positive for  acute PE with CT evidence of right heartstrain (RV/LV Ratio = 1.0)  consistent with at least submassive (intermediate risk)PE. The  presence of right heart strain has been associated with an increased  risk of morbidity and mortality. Consultation with Pulmonary and  Critical Care Medicine is recommended.  2. There is borderline cardiomegaly. There is minimal compressive  atelectasis in both lungs. No pulmonary parenchymal nodules or    masses are demonstrated.   06/16/14 --  Study Conclusions - Left ventricle: The cavity size was normal. Wall thickness was normal. Systolic function was normal. The estimated ejection fraction was in the range of 60% to 65%. Wall motion was normal; there were no regional wall motion abnormalities. Doppler parameters are consistent with abnormal left ventricular relaxation (grade 1 diastolic dysfunction). - Pulmonary arteries: PA peak pressure: 44 mm Hg (S).       Assessment & Plan:  Acute respiratory failure with hypoxia Due to pulmonary embolism. Low suspicion for obstructive lung disease at this time. She needs to work  on her oxygen compliance - I have asked her to use her pulse oximeter and to keep her SpO2 greater than 90%. We will perform an overnight oximetry to see if she continues to need oxygen while sleeping.   Pulmonary embolus No clear benefit or indication to repeat her CT scan of the chest. This may become relevant if she continues to be hypoxemic and dyspneic after 6-12 months of anticoagulation ( i.e. To look for chronic clot).  - Continue xarelto; she will need anti-coag for life given hx recurrent clots

## 2014-08-18 NOTE — Assessment & Plan Note (Signed)
No clear benefit or indication to repeat her CT scan of the chest. This may become relevant if she continues to be hypoxemic and dyspneic after 6-12 months of anticoagulation ( i.e. To look for chronic clot).  - Continue xarelto; she will need anti-coag for life given hx recurrent clots

## 2014-08-18 NOTE — Assessment & Plan Note (Signed)
Due to pulmonary embolism. Low suspicion for obstructive lung disease at this time. She needs to work on her oxygen compliance - I have asked her to use her pulse oximeter and to keep her SpO2 greater than 90%. We will perform an overnight oximetry to see if she continues to need oxygen while sleeping.

## 2014-08-30 ENCOUNTER — Telehealth: Payer: Self-pay | Admitting: Emergency Medicine

## 2014-08-30 NOTE — Telephone Encounter (Signed)
Called pt. She is requesting ONO results she had done by [facility]. She is not sure if this will be accurate or not bc when she woke up the finger piece was off. Sent melissa a message to fax results to triage. Will await fax

## 2014-08-31 NOTE — Telephone Encounter (Signed)
Per message from [place] with [facility]  have made a second request to have this faxed over. Below is what was noted in the pt's account. Thanks!   Rec'd nvision results, DOS 08/24/2014  ONO on room air  Total valid sample 03:46:36 hrs  Lowest spo2 51%  Time below 88% 190.9 mins  Faxed results to Dr. Lamonte Sakai   Please advise RB as fax not receive yet thanks

## 2014-08-31 NOTE — Telephone Encounter (Signed)
Please inform pt that her ONO showed some low oxygen levels while sleeping. She needs to wear her oxygen at 2L/min at night.

## 2014-08-31 NOTE — Telephone Encounter (Signed)
I spoke with patient about results and she verbalized understanding and had no questions 

## 2014-09-01 ENCOUNTER — Telehealth: Payer: Self-pay | Admitting: *Deleted

## 2014-09-01 MED ORDER — ONDANSETRON HCL 4 MG PO TABS: 4.0000 mg | ORAL_TABLET | Freq: Three times a day (TID) | ORAL | Status: DC | PRN

## 2014-09-01 NOTE — Telephone Encounter (Signed)
Refill request fax from pharmacy  

## 2014-09-05 ENCOUNTER — Telehealth: Payer: Self-pay | Admitting: Gastroenterology

## 2014-09-05 ENCOUNTER — Telehealth: Payer: Self-pay | Admitting: Internal Medicine

## 2014-09-05 NOTE — Telephone Encounter (Signed)
Spoke with the patient. She takes Zofran PRN. Her symptoms are becoming more persistent. She is on anti-coagulation therapy. Appointment made for 09/11/14

## 2014-09-05 NOTE — Telephone Encounter (Signed)
s/w pt advised appt time chg on 11/11 from 2p to 10.45 due to md on call. Pt verbalized understanding.

## 2014-09-11 ENCOUNTER — Encounter: Payer: Self-pay | Admitting: Nurse Practitioner

## 2014-09-11 ENCOUNTER — Ambulatory Visit (INDEPENDENT_AMBULATORY_CARE_PROVIDER_SITE_OTHER): Payer: Medicare Other | Admitting: Nurse Practitioner

## 2014-09-11 VITALS — BP 100/64 | HR 115 | Ht 62.0 in | Wt 167.2 lb

## 2014-09-11 DIAGNOSIS — I70219 Atherosclerosis of native arteries of extremities with intermittent claudication, unspecified extremity: Secondary | ICD-10-CM

## 2014-09-11 DIAGNOSIS — R112 Nausea with vomiting, unspecified: Secondary | ICD-10-CM

## 2014-09-11 NOTE — Progress Notes (Addendum)
     History of Present Illness:   Patient is a 68 -year-old female known to Dr. Deatra Ina. She has seen him on multiple occasions for nausea. Upper endoscopy was normal, she was unable to tolerate egg for a gastric emptying scan. Patient was last seen here March 2014, following that she went to see Dr. Brandon Melnick at [facility]. EGD at [facility] August 2014 revealed a benign-appearing esophageal stricture (which was dilated). She had a 2 cm hiatal hernia and mild mucosal abnormalities in duodenal bulb. 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   Patient continues to complain of frequent nausea with intermittent vomiting of mainly mucus. At one time we thought nausea / vomiting was secondary to Zoloft but that was not the case. She also gives a several year history of hiccups which occur while eating and almost always result in nausea / vomiting.  These symptoms have been going on for years. Remeron helped but patient discontinued it because she was scared of taking both the Celexa and Remeron. She takes Zofran but mainly just in the morning. She is on a daily PPI  Current Medications, Allergies, Past Medical History, Past Surgical History, Family History and Social History were reviewed in [employer] record.  Physical Exam: General: Pleasant, well developed , white female in no acute distress Head: Normocephalic and atraumatic Eyes:  sclerae anicteric, conjunctiva pink  Ears: Normal auditory acuity Lungs: Clear throughout to auscultation Heart: Regular rate and rhythm Abdomen: Soft, non distended, non-tender. No masses, no hepatomegaly. Normal bowel sounds Musculoskeletal: Symmetrical with no gross deformities  Extremities: No edema  Neurological: Alert oriented x 4, grossly nonfocal Psychological:  Alert and cooperative. Normal mood and affect  Assessment and Recommendations:   Pleasant 68 year old female, here with her husband, for evaluation of chronic  (years) nausea and vomiting. She has been evaluated by Dr. Deatra Ina and also at [facility]  (last year) but workup has been unrevealing. Gastric emptying study never completed, she could not tolerate the eggs.   I recommended trying Zofran at bedtime in an effort to stave off morning time nausea. She can certainly take Zofran more than once a day if needed.   As far as hiccups, some possible etiologies include GERD, medication side effects,  eating quickly. She has started eating slower and this has helped. Patient is understandably frustrated. I tried to reassure her that no underlying pathology has been uncovered     Addendum:  Dr. Kelby Fam recommendation for Reglan was noted. I didn't try this empirically because she is under care of psychiatrist for anxiety and depression. I tried to avoid giving her medication which could possibly exacerbate these problems.

## 2014-09-11 NOTE — Patient Instructions (Signed)
Talk to psychiatrist  about whether it is okay to restart Remeron while on Celexa, or if Remeron could be used instead of Celexa because it helped gastro problems. Try taking one Zofran at bedtime to see if it helps with morning time nausea.

## 2014-09-12 ENCOUNTER — Encounter: Payer: Self-pay | Admitting: Nurse Practitioner

## 2014-09-12 DIAGNOSIS — R112 Nausea with vomiting, unspecified: Secondary | ICD-10-CM | POA: Insufficient documentation

## 2014-09-12 NOTE — Progress Notes (Signed)
We can try her empirically on a short course of metoclopramide and have her call back in a couple weeks peer I see the entry for metoclopramide in 2012 but this was discontinued.  Is not clear whether she actually took the medication. Instruct patient to  contact me immediately  if she develops any side effects from her Reglan including paresthesias, tremors, confusion , weakness or muscle spasms.

## 2014-10-03 ENCOUNTER — Other Ambulatory Visit: Payer: Self-pay | Admitting: *Deleted

## 2014-10-03 MED ORDER — ONDANSETRON HCL 4 MG PO TABS: 4.0000 mg | ORAL_TABLET | Freq: Three times a day (TID) | ORAL | Status: DC | PRN

## 2014-10-11 ENCOUNTER — Other Ambulatory Visit: Payer: Medicare Other

## 2014-10-13 ENCOUNTER — Other Ambulatory Visit: Payer: Medicare Other

## 2014-10-13 ENCOUNTER — Ambulatory Visit: Payer: Medicare Other

## 2014-10-13 DIAGNOSIS — I2699 Other pulmonary embolism without acute cor pulmonale: Secondary | ICD-10-CM

## 2014-10-18 ENCOUNTER — Ambulatory Visit: Payer: Medicare Other | Admitting: Internal Medicine

## 2014-10-18 LAB — HYPERCOAGULABLE PANEL, COMPREHENSIVE
ANTICARDIOLIPIN IGA: 9 U/mL (ref <22)
ANTITHROMB III FUNC: 78 % (ref 76–126)
Anticardiolipin IgG: 5 GPL U/mL (ref <23)
Anticardiolipin IgM: 14 MPL U/mL — ABNORMAL HIGH (ref <11)
BETA 2 GLYCO I IGG: 6 G Units (ref <20)
BETA-2-GLYCOPROTEIN I IGA: 9 A Units (ref <20)
BETA-2-GLYCOPROTEIN I IGM: 6 M Units (ref <20)
DRVVT 1 1 MIX: 43.6 s — AB (ref <42.9)
DRVVT CONFIRMATION: 1.29 ratio — AB (ref <1.15)
DRVVT: 62.4 secs — ABNORMAL HIGH (ref <42.9)
LUPUS ANTICOAGULANT: DETECTED — AB
PROTEIN C ACTIVITY: 92 % (ref 75–133)
PROTEIN C, TOTAL: 64 % — AB (ref 72–160)
PROTEIN S ACTIVITY: 181 % — AB (ref 69–129)
PTT Lupus Anticoagulant: 45.3 secs — ABNORMAL HIGH (ref 28.0–43.0)
PTTLA 4:1 Mix: 47.4 secs — ABNORMAL HIGH (ref 28.0–43.0)
PTTLA Confirmation: 3.3 secs (ref <8.0)
Protein S Total: 155 % — ABNORMAL HIGH (ref 60–150)

## 2014-10-21 LAB — HEXAGONAL PHOSPHOLIPID NEUTRALIZATION: Hex Phosph Neut Test: NEGATIVE

## 2014-10-23 ENCOUNTER — Ambulatory Visit: Payer: Medicare Other | Admitting: Gastroenterology

## 2014-10-25 ENCOUNTER — Ambulatory Visit (HOSPITAL_BASED_OUTPATIENT_CLINIC_OR_DEPARTMENT_OTHER): Payer: Medicare Other | Admitting: Internal Medicine

## 2014-10-25 ENCOUNTER — Encounter: Payer: Self-pay | Admitting: Internal Medicine

## 2014-10-25 VITALS — BP 107/83 | HR 131 | Temp 98.0°F | Resp 18 | Ht 62.0 in | Wt 168.3 lb

## 2014-10-25 DIAGNOSIS — I2699 Other pulmonary embolism without acute cor pulmonale: Secondary | ICD-10-CM | POA: Insufficient documentation

## 2014-10-25 DIAGNOSIS — Z23 Encounter for immunization: Secondary | ICD-10-CM

## 2014-10-25 DIAGNOSIS — Z853 Personal history of malignant neoplasm of breast: Secondary | ICD-10-CM

## 2014-10-25 DIAGNOSIS — C50911 Malignant neoplasm of unspecified site of right female breast: Secondary | ICD-10-CM

## 2014-10-25 DIAGNOSIS — Z86718 Personal history of other venous thrombosis and embolism: Secondary | ICD-10-CM

## 2014-10-25 MED ORDER — INFLUENZA VAC SPLIT QUAD 0.5 ML IM SUSY
0.5000 mL | PREFILLED_SYRINGE | Freq: Once | INTRAMUSCULAR | Status: DC
  Filled 2014-10-25: qty 0.5

## 2014-10-25 NOTE — Progress Notes (Signed)
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  2) bilateral pulmonary embolism with right deep venous thrombosis.  PRIOR THERAPY:  1. Status post right breast lumpectomy with sentinel lymph node biopsies on January 08, 2001, revealing a 1.2 cm infiltrating ductal carcinoma. The tumor was positive for estrogen and progesterone receptors and negative for HER-2/neu.  2. Status post postoperative radiotherapy to the right breast.  3. Arimidex 1 mg p.o. daily, started May 2002, completed 2012.  CURRENT THERAPY: Xarelto 20 mg by mouth once a day.   INTERVAL HISTORY: Theresa [name] 68 y.o. female returns to the clinic today for followup visit accompanied by her husband. The patient is feeling fine today with no specific complaints. She has a pulse ox at home and she noticed occasional tachycardia with heart rate up to 140-160 then reversed back to normal rate. She is currently on home oxygen mainly at nighttime. She denied having any significant chest pain but has shortness of breath with exertion with no cough or hemoptysis. The patient denied having any bleeding issues. She is currently on Xarelto and tolerating her treatment well. She had repeat hypercoagulable panel performed recently and she is here for evaluation and discussion of her lab results.  MEDICAL HISTORY: Past Medical History  Diagnosis Date  . Stricture and stenosis of esophagus   . Other and unspecified hyperlipidemia   . Dysthymic disorder   . Other and unspecified coagulation defects   . Malignant neoplasm of breast (female), unspecified site   . Diaphragmatic hernia without mention of obstruction or gangrene   . PAD (peripheral artery disease)   . PVD (peripheral vascular disease)      ALLERGIES:  is allergic to ciprofloxacin.  MEDICATIONS:  Current Outpatient Prescriptions  Medication Sig Dispense Refill  . ALPRAZolam (XANAX) 0.25 MG tablet Take 0.25 mg by mouth at bedtime as needed for anxiety.    . clobetasol ointment (TEMOVATE) 1.79 % Apply 1 application topically daily as needed.  1  . escitalopram (LEXAPRO) 10 MG tablet Take 5 mg by mouth daily.    . Multiple Vitamins-Minerals (MULTIVITAMINS) CHEW Chew 1 tablet by mouth daily.    [name] omeprazole (PRILOSEC) 20 MG capsule Take 20 mg by mouth daily.     . ondansetron (ZOFRAN) 4 MG tablet Take 1 tablet (4 mg total) by mouth every 8 (eight) hours as needed for nausea or vomiting. 30 tablet 1  . Pediatric Multivit-Minerals-C (GUMMI BEAR MULTIVITAMIN/MIN) CHEW Chew by mouth.    . promethazine (PHENERGAN) 25 MG tablet Take 25 mg by mouth.    . Rivaroxaban (XARELTO) 15 MG TABS tablet Take 15 mg by mouth 2 (two) times daily with a meal. Patient was taking for 21 days and was to then switch to 27m daily.    . mirtazapine (REMERON) 30 MG tablet Take 30 mg by mouth at bedtime.    . [DISCONTINUED] lansoprazole (PREVACID) 30 MG capsule Take 30 mg by mouth daily.      . [DISCONTINUED] metoCLOPramide (REGLAN) 10 MG tablet Take one tab one half hour before meals and at bedtime 40 tablet 3   Current Facility-Administered Medications  Medication Dose Route Frequency Provider Last Rate Last Dose  . Influenza vac split quadrivalent PF (FLUARIX) injection 0.5 mL  0.5 mL Intramuscular  Once Curt Bears, MD        SURGICAL HISTORY:  Past Surgical History  Procedure Laterality Date  . Cholecystectomy    . Appendectomy    . Tubal ligation    . Breast lumpectomy      Right    REVIEW OF SYSTEMS:  A comprehensive review of systems was negative except for: Respiratory: positive for dyspnea on exertion   PHYSICAL EXAMINATION: General appearance: alert, cooperative and no distress Head: Normocephalic, without obvious abnormality,  atraumatic Neck: no adenopathy, no JVD, supple, symmetrical, trachea midline and thyroid not enlarged, symmetric, no tenderness/mass/nodules Lymph nodes: Cervical, supraclavicular, and axillary nodes normal. Resp: clear to auscultation bilaterally Back: symmetric, no curvature. ROM normal. No CVA tenderness. Cardio: regular rate and rhythm, S1, S2 normal, no murmur, click, rub or gallop GI: soft, non-tender; bowel sounds normal; no masses,  no organomegaly Extremities: edema 1+ right lower extremity Neurologic: Alert and oriented X 3, normal strength and tone. Normal symmetric reflexes. Normal coordination and gait   ECOG PERFORMANCE STATUS: 0 - Asymptomatic  Blood pressure 107/83, pulse 131, temperature 98 F (36.7 C), temperature source Oral, resp. rate 18, height _0  (1.575 m), weight 168 lb 4.8 oz (76.34 kg).  LABORATORY DATA: Lab Results  Component Value Date   WBC 9.6 07/12/2014   HGB 14.2 07/12/2014   HCT 44.3 07/12/2014   MCV 98.2 07/12/2014   PLT 284 07/12/2014      Chemistry      Component Value Date/Time   NA 141 07/12/2014 1500   NA 144 06/17/2014 0500   K 3.8 07/12/2014 1500   K 3.4* 06/17/2014 0500   CL 103 06/17/2014 0500   CL 104 03/02/2013 1531   CO2 26 07/12/2014 1500   CO2 27 06/17/2014 0500   BUN 8.9 07/12/2014 1500   BUN 7 06/17/2014 0500   CREATININE 0.8 07/12/2014 1500   CREATININE 0.74 06/17/2014 0500      Component Value Date/Time   CALCIUM 9.0 07/12/2014 1500   CALCIUM 7.3* 06/17/2014 0500   ALKPHOS 80 07/12/2014 1500   ALKPHOS 98 06/16/2014 0300   AST 37* 07/12/2014 1500   AST 27 06/16/2014 0300   ALT 26 07/12/2014 1500   ALT 19 06/16/2014 0300   BILITOT 0.56 07/12/2014 1500   BILITOT 0.7 06/16/2014 0300       RADIOGRAPHIC STUDIES:  ASSESSMENT AND PLAN: This is a very pleasant 68 years old white female with:   1) History of stage I right breast infiltrating ductal carcinoma status post surgical resection followed by  postoperative radiotherapy and completion of 10 years of hormonal therapy with Arimidex.  The patient is currently on observation with no evidence for disease recurrence on the recent blood or imaging studies. She will come back for follow-up visit in March 2016 as previously scheduled.   2) Acute venous thrombosis with pulmonary embolus:  Repeat hypercoagulable panel showed improvemed but still low protein C, protein C activity was normal, lupus anticoagulant was detected but the hexagonal phospholipid neutralization test was negative. I recommended for the patient to continue her current treatment with Xarelto at a dose of 20 mg total daily. She needs to stay on treatment for at least one year.  3) History of tachycardia: I ordered EKG today and it showed normal sinus rhythm with heart rate of 89. I recommended for the patient to consult with her primary care physician or cardiology if she has any other episodes of tachycardia at home.  She was  advised to call immediately if she has any concerning symptoms in the interval.  All questions were answered. The patient knows to call the clinic with any problems, questions or concerns. We can certainly see the patient much sooner if necessary.  Disclaimer: This note was dictated with voice recognition software. Similar sounding words can inadvertently be transcribed and may not be corrected upon review.

## 2014-11-01 ENCOUNTER — Encounter (HOSPITAL_COMMUNITY): Payer: Medicare Other

## 2014-11-01 ENCOUNTER — Ambulatory Visit: Payer: Medicare Other | Admitting: Vascular Surgery

## 2014-11-03 ENCOUNTER — Emergency Department (HOSPITAL_COMMUNITY)
Admission: EM | Admit: 2014-11-03 | Discharge: 2014-11-03 | Disposition: A | Discharge status: Home / Self Care | Payer: Medicare Other | Attending: Emergency Medicine | Admitting: Emergency Medicine

## 2014-11-03 ENCOUNTER — Encounter (HOSPITAL_COMMUNITY): Payer: Self-pay | Admitting: Emergency Medicine

## 2014-11-03 ENCOUNTER — Emergency Department (HOSPITAL_COMMUNITY): Payer: Medicare Other

## 2014-11-03 DIAGNOSIS — R040 Epistaxis: Secondary | ICD-10-CM

## 2014-11-03 DIAGNOSIS — I739 Peripheral vascular disease, unspecified: Secondary | ICD-10-CM | POA: Insufficient documentation

## 2014-11-03 DIAGNOSIS — D689 Coagulation defect, unspecified: Secondary | ICD-10-CM | POA: Diagnosis present

## 2014-11-03 DIAGNOSIS — Z87891 Personal history of nicotine dependence: Secondary | ICD-10-CM | POA: Insufficient documentation

## 2014-11-03 DIAGNOSIS — Z8639 Personal history of other endocrine, nutritional and metabolic disease: Secondary | ICD-10-CM | POA: Insufficient documentation

## 2014-11-03 DIAGNOSIS — R042 Hemoptysis: Secondary | ICD-10-CM | POA: Diagnosis not present

## 2014-11-03 DIAGNOSIS — Z8659 Personal history of other mental and behavioral disorders: Secondary | ICD-10-CM | POA: Diagnosis not present

## 2014-11-03 DIAGNOSIS — Z7901 Long term (current) use of anticoagulants: Secondary | ICD-10-CM | POA: Diagnosis not present

## 2014-11-03 DIAGNOSIS — Z8719 Personal history of other diseases of the digestive system: Secondary | ICD-10-CM | POA: Insufficient documentation

## 2014-11-03 DIAGNOSIS — Z853 Personal history of malignant neoplasm of breast: Secondary | ICD-10-CM | POA: Insufficient documentation

## 2014-11-03 DIAGNOSIS — Z79899 Other long term (current) drug therapy: Secondary | ICD-10-CM | POA: Insufficient documentation

## 2014-11-03 DIAGNOSIS — Z86711 Personal history of pulmonary embolism: Secondary | ICD-10-CM | POA: Diagnosis not present

## 2014-11-03 LAB — COMPREHENSIVE METABOLIC PANEL
ALBUMIN: 2.9 g/dL — AB (ref 3.5–5.2)
ALT: 49 U/L — ABNORMAL HIGH (ref 0–35)
ANION GAP: 13 (ref 5–15)
AST: 79 U/L — AB (ref 0–37)
Alkaline Phosphatase: 98 U/L (ref 39–117)
BUN: 8 mg/dL (ref 6–23)
CO2: 29 mEq/L (ref 19–32)
Calcium: 9 mg/dL (ref 8.4–10.5)
Chloride: 97 mEq/L (ref 96–112)
Creatinine, Ser: 0.74 mg/dL (ref 0.50–1.10)
GFR calc Af Amer: >90 mL/min (ref >90)
GFR calc non Af Amer: 85 mL/min — ABNORMAL LOW (ref >90)
Glucose, Bld: 87 mg/dL (ref 70–99)
Potassium: 3.8 mEq/L (ref 3.7–5.3)
Sodium: 139 mEq/L (ref 137–147)
TOTAL PROTEIN: 6.7 g/dL (ref 6.0–8.3)
Total Bilirubin: 0.9 mg/dL (ref 0.3–1.2)

## 2014-11-03 LAB — CBC WITH DIFFERENTIAL/PLATELET
BASOS ABS: 0 10*3/uL (ref 0.0–0.1)
Basophils Relative: 1 % (ref 0–1)
EOS ABS: 0.1 10*3/uL (ref 0.0–0.7)
Eosinophils Relative: 2 % (ref 0–5)
HCT: 40.2 % (ref 36.0–46.0)
HEMOGLOBIN: 13.7 g/dL (ref 12.0–15.0)
Lymphocytes Relative: 31 % (ref 12–46)
Lymphs Abs: 1.9 10*3/uL (ref 0.7–4.0)
MCH: 33.8 pg (ref 26.0–34.0)
MCHC: 34.1 g/dL (ref 30.0–36.0)
MCV: 99.3 fL (ref 78.0–100.0)
MONO ABS: 0.7 10*3/uL (ref 0.1–1.0)
MONOS PCT: 12 % (ref 3–12)
NEUTROS PCT: 54 % (ref 43–77)
Neutro Abs: 3.3 10*3/uL (ref 1.7–7.7)
Platelets: 216 10*3/uL (ref 150–400)
RBC: 4.05 MIL/uL (ref 3.87–5.11)
RDW: 17.1 % — ABNORMAL HIGH (ref 11.5–15.5)
WBC: 6.1 10*3/uL (ref 4.0–10.5)

## 2014-11-03 LAB — LIPASE, BLOOD: Lipase: 28 U/L (ref 11–59)

## 2014-11-03 NOTE — Discharge Instructions (Signed)
Hemoptysis Hemoptysis means coughing up blood. The blood may come from the lungs and airways. It can also come from bleeding that occurs outside the lungs and airways. Coughing up blood can be a sign of a minor problem or a serious medical condition.  HOME CARE  Only take medicine as told by your doctor. Do not use medicines that help you stop coughing (cough suppressants) unless your doctor approves.  If you are given antibiotic medicine, take it as told. Finish it even if you start to feel better.  Do not smoke. Also avoid being around others when they are smoking.  Follow up with your doctor as told. GET HELP RIGHT AWAY IF:  You cough up bloody spit (mucus) for longer than a week.  You have a blood-producing cough that is severe or getting worse.  You have a blood-producing cough thatcomes and goes over time.  You have trouble breathing.   You throw up (vomit) blood.  You have bloody or black poop (stool).  You have chest pain.   You have night sweats.  You feel faint or pass out.   You have a fever or lasting symptoms for more than 2-3 days.  You have a fever and your symptoms suddenly get worse. MAKE SURE YOU:  Understand these instructions.  Will watch your condition.  Will get help right away if you are not doing well or get worse. Document Released: 11/10/2012 Document Reviewed: 11/10/2012 [facility] Patient Information 2015 [place]. This information is not intended to replace advice given to you by your health care provider. Make sure you discuss any questions you have with your health care provider.  Nosebleed A nosebleed can be caused by many things, including:  Getting hit hard in the nose.  Infections.  Dry nose.  Colds.  Medicines. Your doctor may do lab testing if you get nosebleeds a lot and the cause is not known. HOME CARE   If your nose was packed with material, keep it there until your doctor takes it out. Put the pack back in  your nose if the pack falls out.  Do not blow your nose for 12 hours after the nosebleed.  Sit up and bend forward if your nose starts bleeding again. Pinch the front half of your nose nonstop for 20 minutes.  Put petroleum jelly inside your nose every morning if you have a dry nose.  Use a humidifier to make the air less dry.  Do not take aspirin.  Try not to strain, lift, or bend at the waist for many days after the nosebleed. GET HELP RIGHT AWAY IF:   Nosebleeds keep happening and are hard to stop or control.  You have bleeding or bruises that are not normal on other parts of the body.  You have a fever.  The nosebleeds get worse.  You get lightheaded, feel faint, sweaty, or throw up (vomit) blood. MAKE SURE YOU:   Understand these instructions.  Will watch your condition.  Will get help right away if you are not doing well or get worse. Document Released: 09/02/2008 Document Revised: 02/16/2012 Document Reviewed: 09/02/2008 [facility] Patient Information 2015 [place], [place]. This information is not intended to replace advice given to you by your health care provider. Make sure you discuss any questions you have with your health care provider.

## 2014-11-03 NOTE — ED Provider Notes (Signed)
CSN: [id number]     Arrival date & time 11/03/14  1605 History   First MD Initiated Contact with Patient 11/03/14 1749     Chief Complaint  Patient presents with  . Coagulation Disorder    Pt on Xarelto     (Consider location/radiation/quality/duration/timing/severity/associated sxs/prior Treatment) HPI Comments: Patient is a 68 year old female with history of pulmonary embolism, peripheral vascular disease, peripheral artery disease, malignant neoplasm of breast presents emergency department for evaluation of hemoptysis. Patient reports that she has had a cough for the past few days. It is productive in nature, but only happens in the morning. Yesterday morning there was some blood tinged sputum. This happened once in the morning and did not recur. She again had hemoptysis this morning, although today it was associated with mild nose bleed. The bleed stopped almost instantaneously. This was the only episode of hemoptysis today. She is here from recommendation of her physician son in law who recommends a cbc.  She is on Xarelto for pulmonary embolism. She sees a Electrical engineer for this. She denies any chest pain or shortness of breath and generally feels well currently. No fevers, chills, congestion. She has chronic abdominal pain for which she is seeing a gastroenterologist.   The history is provided by the patient. No language interpreter was used.    Past Medical History  Diagnosis Date  . Stricture and stenosis of esophagus   . Other and unspecified hyperlipidemia   . Dysthymic disorder   . Other and unspecified coagulation defects   . Malignant neoplasm of breast (female), unspecified site   . Diaphragmatic hernia without mention of obstruction or gangrene   . PAD (peripheral artery disease)   . PVD (peripheral vascular disease)    Past Surgical History  Procedure Laterality Date  . Cholecystectomy    . Appendectomy    . Tubal ligation    . Breast lumpectomy      Right   Family  History  Problem Relation Age of Onset  . Heart disease Father   . Heart attack Father   . Colon cancer Neg Hx   . Lung cancer Maternal Grandfather   . Deep vein thrombosis Daughter    History  Substance Use Topics  . Smoking status: Former Smoker -- 1.50 packs/day for 38 years    Types: Cigarettes    Quit date: 12/08/1998  . Smokeless tobacco: Never Used  . Alcohol Use: 7.0 oz/week    14 Not specified per week     Comment: 2 drinks per night   OB History    No data available     Review of Systems  Constitutional: Negative for fever and chills.  Respiratory: Negative for chest tightness and shortness of breath.        Hemoptysis   Cardiovascular: Negative for chest pain.  Gastrointestinal: Negative for nausea, vomiting and abdominal pain.  All other systems reviewed and are negative.     Allergies  Ciprofloxacin  Home Medications   Prior to Admission medications   Medication Sig Start Date End Date Taking? Authorizing Provider  ALPRAZolam Duanne Moron) 0.25 MG tablet Take 0.25 mg by mouth at bedtime as needed for anxiety.   Yes Historical Provider, MD  clobetasol ointment (TEMOVATE) 8.67 % Apply 1 application topically daily as needed (groin itch).  10/09/14  Yes Historical Provider, MD  escitalopram (LEXAPRO) 10 MG tablet Take 5 mg by mouth daily.   Yes Historical Provider, MD  Multiple Vitamins-Minerals (MULTIVITAMINS) CHEW Chew 1 tablet by mouth  daily.   Yes Historical Provider, MD  ondansetron (ZOFRAN) 4 MG tablet Take 1 tablet (4 mg total) by mouth every 8 (eight) hours as needed for nausea or vomiting. 10/03/14  Yes Inda Castle, MD  pantoprazole (PROTONIX) 40 MG tablet Take 40 mg by mouth daily.  10/16/14  Yes Historical Provider, MD  promethazine (PHENERGAN) 25 MG tablet Take 25 mg by mouth every 6 (six) hours as needed for nausea.    Yes Historical Provider, MD  rivaroxaban (XARELTO) 20 MG TABS tablet Take 20 mg by mouth daily with supper.   Yes Historical Provider,  MD  mirtazapine (REMERON) 30 MG tablet Take 30 mg by mouth at bedtime.    Historical Provider, MD  omeprazole (PRILOSEC) 20 MG capsule Take 20 mg by mouth daily.     Historical Provider, MD  Rivaroxaban (XARELTO) 15 MG TABS tablet Take 15 mg by mouth 2 (two) times daily with a meal. Patient was taking for 21 days and was to then switch to 20mg  daily.    Historical Provider, MD   BP 139/82 mmHg  Pulse 82  Temp(Src) 99 F (37.2 C) (Oral)  Resp 16  Wt 165 lb (74.844 kg)  SpO2 93% Physical Exam  Constitutional: She is oriented to person, place, and time. She appears well-developed and well-nourished. No distress.  Well appearing  HENT:  Head: Normocephalic and atraumatic.  Right Ear: External ear normal.  Left Ear: External ear normal.  Nose: Nose normal. No nasal septal hematoma. No epistaxis.  Mouth/Throat: Oropharynx is clear and moist.  No current evidence of nosebleed  Eyes: Conjunctivae are normal.  Neck: Normal range of motion.  Cardiovascular: Normal rate, regular rhythm, normal heart sounds, intact distal pulses and normal pulses.   Pulses:      Radial pulses are 2+ on the right side, and 2+ on the left side.       Posterior tibial pulses are 2+ on the right side, and 2+ on the left side.  Pulmonary/Chest: Effort normal and breath sounds normal. No stridor. No respiratory distress. She has no decreased breath sounds. She has no wheezes. She has no rhonchi. She has no rales.  Abdominal: Soft. She exhibits no distension. There is no tenderness.  Musculoskeletal: Normal range of motion.  Neurological: She is alert and oriented to person, place, and time. She has normal strength.  Skin: Skin is warm and dry. She is not diaphoretic. No erythema.  Psychiatric: She has a normal mood and affect. Her behavior is normal.  Nursing note and vitals reviewed.   ED Course  Procedures (including critical care time) Labs Review Labs Reviewed  CBC WITH DIFFERENTIAL - Abnormal; Notable for  the following:    RDW 17.1 (*)    All other components within normal limits  COMPREHENSIVE METABOLIC PANEL - Abnormal; Notable for the following:    Albumin 2.9 (*)    AST 79 (*)    ALT 49 (*)    GFR calc non Af Amer 85 (*)    All other components within normal limits  LIPASE, BLOOD    Imaging Review Dg Chest 2 View  11/03/2014   CLINICAL DATA:  Cough, spitting at blood for 2 days  EXAM: CHEST  2 VIEW  COMPARISON:  06/15/2014  FINDINGS: Cardiomediastinal silhouette is stable. No pulmonary edema. Subtle streaky left basilar atelectasis or early infiltrate.  IMPRESSION: No pulmonary edema. Subtle streaky left basilar atelectasis or early infiltrate.   Electronically Signed   By: Julien Girt  Pop M.D.   On: 11/03/2014 19:11     EKG Interpretation None      MDM   Final diagnoses:  Hemoptysis  Epistaxis    Patient presents to ED for evaluation of hemoptysis. 1 episode yesterday, 1 episode today. Patient is on Xarelto for PE. Normal H&H. Chest XR shows subtle streaky left basilar atelectasis or early infiltrate. Will treat for pneumonia with azithromycin. Patient did not receive scripts at time of discharge. Called patient 11/29. She is doing well, no additional episodes of hemoptysis. Still morning productive cough. She will pick up z-pack at [facility]. Diflucan was also called in as patient reports severe yeast infections while taking antibiotics. Again discussed reasons to return to ED. Vital signs stable for discharge. Patient will follow up with PCP. Discussed case with Dr. Wilson Singer who agrees with plan. Patient / Family / Caregiver informed of clinical course, understand medical decision-making process, and agree with plan.    Elwyn Lade, PA-C 11/05/14 1711  Virgel Manifold, MD 11/08/14 [phone]

## 2014-11-03 NOTE — ED Notes (Signed)
Patient transported to X-ray 

## 2014-11-03 NOTE — ED Notes (Signed)
Bed: WA12 Expected date:  Expected time:  Means of arrival:  Comments: Triage 1  

## 2014-11-07 ENCOUNTER — Emergency Department (HOSPITAL_COMMUNITY)
Admission: EM | Admit: 2014-11-07 | Discharge: 2014-11-08 | Disposition: A | Discharge status: Home / Self Care | Payer: Medicare Other | Attending: Emergency Medicine | Admitting: Emergency Medicine

## 2014-11-07 ENCOUNTER — Encounter: Payer: Self-pay | Admitting: Vascular Surgery

## 2014-11-07 ENCOUNTER — Encounter (HOSPITAL_COMMUNITY): Payer: Self-pay | Admitting: Emergency Medicine

## 2014-11-07 ENCOUNTER — Other Ambulatory Visit: Payer: Self-pay

## 2014-11-07 ENCOUNTER — Emergency Department (HOSPITAL_COMMUNITY): Payer: Medicare Other

## 2014-11-07 DIAGNOSIS — R000 Tachycardia, unspecified: Secondary | ICD-10-CM | POA: Diagnosis present

## 2014-11-07 DIAGNOSIS — F341 Dysthymic disorder: Secondary | ICD-10-CM | POA: Insufficient documentation

## 2014-11-07 DIAGNOSIS — R0602 Shortness of breath: Secondary | ICD-10-CM | POA: Insufficient documentation

## 2014-11-07 DIAGNOSIS — R002 Palpitations: Secondary | ICD-10-CM

## 2014-11-07 DIAGNOSIS — Z7902 Long term (current) use of antithrombotics/antiplatelets: Secondary | ICD-10-CM | POA: Insufficient documentation

## 2014-11-07 DIAGNOSIS — Z853 Personal history of malignant neoplasm of breast: Secondary | ICD-10-CM | POA: Diagnosis not present

## 2014-11-07 DIAGNOSIS — Z79899 Other long term (current) drug therapy: Secondary | ICD-10-CM | POA: Diagnosis not present

## 2014-11-07 DIAGNOSIS — Z8719 Personal history of other diseases of the digestive system: Secondary | ICD-10-CM | POA: Diagnosis not present

## 2014-11-07 DIAGNOSIS — H539 Unspecified visual disturbance: Secondary | ICD-10-CM | POA: Insufficient documentation

## 2014-11-07 DIAGNOSIS — Z87891 Personal history of nicotine dependence: Secondary | ICD-10-CM | POA: Insufficient documentation

## 2014-11-07 DIAGNOSIS — I471 Supraventricular tachycardia, unspecified: Secondary | ICD-10-CM

## 2014-11-07 DIAGNOSIS — Z8639 Personal history of other endocrine, nutritional and metabolic disease: Secondary | ICD-10-CM | POA: Insufficient documentation

## 2014-11-07 DIAGNOSIS — Z862 Personal history of diseases of the blood and blood-forming organs and certain disorders involving the immune mechanism: Secondary | ICD-10-CM | POA: Insufficient documentation

## 2014-11-07 DIAGNOSIS — Z86711 Personal history of pulmonary embolism: Secondary | ICD-10-CM | POA: Diagnosis not present

## 2014-11-07 HISTORY — DX: Supraventricular tachycardia, unspecified: I47.10

## 2014-11-07 HISTORY — DX: Supraventricular tachycardia: I47.1

## 2014-11-07 LAB — CBC WITH DIFFERENTIAL/PLATELET
BASOS PCT: 1 % (ref 0–1)
Basophils Absolute: 0 10*3/uL (ref 0.0–0.1)
Eosinophils Absolute: 0.1 10*3/uL (ref 0.0–0.7)
Eosinophils Relative: 2 % (ref 0–5)
HCT: 44.3 % (ref 36.0–46.0)
Hemoglobin: 14.8 g/dL (ref 12.0–15.0)
LYMPHS PCT: 40 % (ref 12–46)
Lymphs Abs: 2.7 10*3/uL (ref 0.7–4.0)
MCH: 33 pg (ref 26.0–34.0)
MCHC: 33.4 g/dL (ref 30.0–36.0)
MCV: 98.9 fL (ref 78.0–100.0)
MONOS PCT: 13 % — AB (ref 3–12)
Monocytes Absolute: 0.9 10*3/uL (ref 0.1–1.0)
NEUTROS ABS: 3 10*3/uL (ref 1.7–7.7)
NEUTROS PCT: 44 % (ref 43–77)
Platelets: 213 10*3/uL (ref 150–400)
RBC: 4.48 MIL/uL (ref 3.87–5.11)
RDW: 16.4 % — ABNORMAL HIGH (ref 11.5–15.5)
WBC: 6.8 10*3/uL (ref 4.0–10.5)

## 2014-11-07 LAB — PROTIME-INR
INR: 1.59 — AB (ref 0.00–1.49)
Prothrombin Time: 19.1 seconds — ABNORMAL HIGH (ref 11.6–15.2)

## 2014-11-07 LAB — BASIC METABOLIC PANEL
Anion gap: 19 — ABNORMAL HIGH (ref 5–15)
BUN: 7 mg/dL (ref 6–23)
CHLORIDE: 96 meq/L (ref 96–112)
CO2: 25 meq/L (ref 19–32)
Calcium: 8.3 mg/dL — ABNORMAL LOW (ref 8.4–10.5)
Creatinine, Ser: 1.07 mg/dL (ref 0.50–1.10)
GFR calc Af Amer: 60 mL/min — ABNORMAL LOW (ref >90)
GFR calc non Af Amer: 52 mL/min — ABNORMAL LOW (ref >90)
Glucose, Bld: 76 mg/dL (ref 70–99)
Potassium: 3.6 mEq/L — ABNORMAL LOW (ref 3.7–5.3)
Sodium: 140 mEq/L (ref 137–147)

## 2014-11-07 MED ORDER — ADENOSINE 6 MG/2ML IV SOLN: 6.0000 mg | Freq: Once | INTRAVENOUS | Status: DC

## 2014-11-07 MED ORDER — SODIUM CHLORIDE 0.9 % IV SOLN: INTRAVENOUS | Status: DC

## 2014-11-07 MED ORDER — POTASSIUM CHLORIDE CRYS ER 20 MEQ PO TBCR
40.0000 meq | EXTENDED_RELEASE_TABLET | Freq: Once | ORAL | Status: DC
  Filled 2014-11-07: qty 2

## 2014-11-07 MED ORDER — SODIUM CHLORIDE 0.9 % IV BOLUS (SEPSIS)
250.0000 mL | Freq: Once | INTRAVENOUS | Status: AC
  Administered 2014-11-07: 250 mL via INTRAVENOUS

## 2014-11-07 MED ORDER — POTASSIUM CHLORIDE 20 MEQ/15ML (10%) PO SOLN
40.0000 meq | Freq: Once | ORAL | Status: AC
  Administered 2014-11-07: 40 meq via ORAL
  Filled 2014-11-07: qty 30

## 2014-11-07 MED ORDER — ADENOSINE 6 MG/2ML IV SOLN
INTRAVENOUS | Status: AC
  Filled 2014-11-07: qty 10

## 2014-11-07 NOTE — ED Notes (Signed)
Repeat EKG given to Dr. Helane Gunther.

## 2014-11-07 NOTE — ED Provider Notes (Signed)
CSN: [id number]     Arrival date & time 11/07/14  2048 History   First MD Initiated Contact with Patient 11/07/14 2048     Chief Complaint  Patient presents with  . Tachycardia     (Consider location/radiation/quality/duration/timing/severity/associated sxs/prior Treatment) The history is provided by the patient and the spouse.   68 year old female presenting with rapid heart rate palpitations associated with some shortness of breath started 2 hours prior to being seen by [place]. Patient has had a history of rapid heart rate in the past but is never been captured. EKG L Bernie Covey was consistent with superventricular tachycardia with a heart rate around 190. Patient's blood pressure was above 90. No significant chest pain. Patient mentating fine. Patient is currently on Xarelto for blood clots. Patient was seen at [facility] long on the 27th chest x-ray there showed some question of pneumonia patient was started on Z-Pak for that. Patient not followed by cardiology.  Past Medical History  Diagnosis Date  . Stricture and stenosis of esophagus   . Other and unspecified hyperlipidemia   . Dysthymic disorder   . Other and unspecified coagulation defects   . Malignant neoplasm of breast (female), unspecified site   . Diaphragmatic hernia without mention of obstruction or gangrene   . PAD (peripheral artery disease)   . PVD (peripheral vascular disease)    Past Surgical History  Procedure Laterality Date  . Cholecystectomy    . Appendectomy    . Tubal ligation    . Breast lumpectomy      Right   Family History  Problem Relation Age of Onset  . Heart disease Father   . Heart attack Father   . Colon cancer Neg Hx   . Lung cancer Maternal Grandfather   . Deep vein thrombosis Daughter    History  Substance Use Topics  . Smoking status: Former Smoker -- 1.50 packs/day for 38 years    Types: Cigarettes    Quit date: 12/08/1998  . Smokeless tobacco: Never Used  . Alcohol Use: 7.0 oz/week    14  Not specified per week     Comment: 2 drinks per night   OB History    No data available     Review of Systems  Constitutional: Negative for fever.  HENT: Negative for congestion.   Eyes: Positive for visual disturbance.  Respiratory: Positive for shortness of breath.   Cardiovascular: Positive for palpitations. Negative for chest pain.  Gastrointestinal: Negative for nausea, vomiting and abdominal pain.  Genitourinary: Negative for dysuria.  Musculoskeletal: Negative for back pain.  Skin: Negative for rash.  Neurological: Negative for syncope and headaches.  Hematological: Does not bruise/bleed easily.  Psychiatric/Behavioral: Negative for confusion.      Allergies  Ciprofloxacin  Home Medications   Prior to Admission medications   Medication Sig Start Date End Date Taking? Authorizing Provider  ALPRAZolam Duanne Moron) 0.25 MG tablet Take 0.25 mg by mouth at bedtime as needed for anxiety.    Historical Provider, MD  clobetasol ointment (TEMOVATE) 9.38 % Apply 1 application topically daily as needed (groin itch).  10/09/14   Historical Provider, MD  escitalopram (LEXAPRO) 10 MG tablet Take 5 mg by mouth daily.    Historical Provider, MD  mirtazapine (REMERON) 30 MG tablet Take 30 mg by mouth at bedtime.    Historical Provider, MD  Multiple Vitamins-Minerals (MULTIVITAMINS) CHEW Chew 1 tablet by mouth daily.    Historical Provider, MD  omeprazole (PRILOSEC) 20 MG capsule Take 20 mg by  mouth daily.     Historical Provider, MD  ondansetron (ZOFRAN) 4 MG tablet Take 1 tablet (4 mg total) by mouth every 8 (eight) hours as needed for nausea or vomiting. 10/03/14   Inda Castle, MD  pantoprazole (PROTONIX) 40 MG tablet Take 40 mg by mouth daily.  10/16/14   Historical Provider, MD  promethazine (PHENERGAN) 25 MG tablet Take 25 mg by mouth every 6 (six) hours as needed for nausea.     Historical Provider, MD  Rivaroxaban (XARELTO) 15 MG TABS tablet Take 15 mg by mouth 2 (two) times daily  with a meal. Patient was taking for 21 days and was to then switch to 20mg  daily.    Historical Provider, MD  rivaroxaban (XARELTO) 20 MG TABS tablet Take 20 mg by mouth daily with supper.    Historical Provider, MD   BP 103/80 mmHg  Pulse 91  Temp(Src) 98.2 F (36.8 C) (Oral)  Resp 17  SpO2 92% Physical Exam  Constitutional: She is oriented to person, place, and time. She appears well-developed and well-nourished. No distress.  HENT:  Head: Normocephalic and atraumatic.  Mouth/Throat: Oropharynx is clear and moist.  Eyes: Conjunctivae and EOM are normal. Pupils are equal, round, and reactive to light.  Neck: Normal range of motion.  Cardiovascular: Normal heart sounds.   No murmur heard. Rapid tachycardia heart rate above 150  Pulmonary/Chest: Effort normal. No respiratory distress.  Abdominal: Soft. Bowel sounds are normal. There is no tenderness.  Musculoskeletal: Normal range of motion. She exhibits no edema.  Neurological: She is alert and oriented to person, place, and time. No cranial nerve deficit. She exhibits normal muscle tone. Coordination normal.  Skin: Skin is warm. No rash noted.  Nursing note and vitals reviewed.   ED Course  Procedures (including critical care time) Labs Review Labs Reviewed  CBC WITH DIFFERENTIAL - Abnormal; Notable for the following:    RDW 16.4 (*)    Monocytes Relative 13 (*)    All other components within normal limits  BASIC METABOLIC PANEL - Abnormal; Notable for the following:    Potassium 3.6 (*)    Calcium 8.3 (*)    GFR calc non Af Amer 52 (*)    GFR calc Af Amer 60 (*)    Anion gap 19 (*)    All other components within normal limits  PROTIME-INR - Abnormal; Notable for the following:    Prothrombin Time 19.1 (*)    INR 1.59 (*)    All other components within normal limits   Results for orders placed or performed during the hospital encounter of 11/07/14  CBC with Differential  Result Value Ref Range   WBC 6.8 4.0 - 10.5  K/uL   RBC 4.48 3.87 - 5.11 MIL/uL   Hemoglobin 14.8 12.0 - 15.0 g/dL   HCT 44.3 36.0 - 46.0 %   MCV 98.9 78.0 - 100.0 fL   MCH 33.0 26.0 - 34.0 pg   MCHC 33.4 30.0 - 36.0 g/dL   RDW 16.4 (H) 11.5 - 15.5 %   Platelets 213 150 - 400 K/uL   Neutrophils Relative % 44 43 - 77 %   Neutro Abs 3.0 1.7 - 7.7 K/uL   Lymphocytes Relative 40 12 - 46 %   Lymphs Abs 2.7 0.7 - 4.0 K/uL   Monocytes Relative 13 (H) 3 - 12 %   Monocytes Absolute 0.9 0.1 - 1.0 K/uL   Eosinophils Relative 2 0 - 5 %   Eosinophils  Absolute 0.1 0.0 - 0.7 K/uL   Basophils Relative 1 0 - 1 %   Basophils Absolute 0.0 0.0 - 0.1 K/uL  Basic metabolic panel  Result Value Ref Range   Sodium 140 137 - 147 mEq/L   Potassium 3.6 (L) 3.7 - 5.3 mEq/L   Chloride 96 96 - 112 mEq/L   CO2 25 19 - 32 mEq/L   Glucose, Bld 76 70 - 99 mg/dL   BUN 7 6 - 23 mg/dL   Creatinine, Ser 1.07 0.50 - 1.10 mg/dL   Calcium 8.3 (L) 8.4 - 10.5 mg/dL   GFR calc non Af Amer 52 (L) >90 mL/min   GFR calc Af Amer 60 (L) >90 mL/min   Anion gap 19 (H) 5 - 15  Protime-INR  Result Value Ref Range   Prothrombin Time 19.1 (H) 11.6 - 15.2 seconds   INR 1.59 (H) 0.00 - 1.49     Imaging Review No results found.   EKG Interpretation   Date/Time:  Tuesday November 07 2014 21:05:54 EST Ventricular Rate:  109 PR Interval:  135 QRS Duration: 89 QT Interval:  337 QTC Calculation: 616 R Axis:   78 Text Interpretation:  Sinus tachycardia Minimal ST depression, lateral  leads SVT resolved Confirmed by Orin Eberwein  MD, Neesha Langton ([id number]) on 11/07/2014  10:44:33 PM          MDM   Final diagnoses:  Heart palpitations  SVT (supraventricular tachycardia)   Patient presented with EKG consistent with superventricular tachycardia. With heart rates around 190. Been that way for about 2 hours prior to arrival. Was about rate to give the adenosine 6 mg IV push 1 patient spontaneously converted to a sinus rhythm.    Patient without any recurrence of SVT while  here in the emergency department. Patient's labs without significant abnormalities other than a mild hypokalemia with potassium of 3.6. Oral potassium provided. Chest x-ray and other labs all negative. Patient can be discharged home with follow-up with cardiology. Referral information provided. She will call in the morning. Patient will return for any recurrent rapid heart rate or any new or worse symptoms.  Fredia Sorrow, MD 11/07/14 [phone]

## 2014-11-07 NOTE — Discharge Instructions (Signed)
Make an appointment with cardiology call in the morning. Return for recurrent rapid heart rate lasting 40 minutes or longer. Return for any new or worse symptoms. Continue all current meds. Follow-up with cardiology is important.

## 2014-11-07 NOTE — ED Notes (Signed)
Pt spontaneously converted to HR of 113. MD at bedside. Adenosine not given.

## 2014-11-07 NOTE — ED Notes (Signed)
Pt states she felt her heart beating fast starting roughly an hour ago. Pt states she has blood clots in legs and lungs. Pt also states she was seen at [facility] for pneumonia. Md at bedside.

## 2014-11-08 ENCOUNTER — Ambulatory Visit: Payer: Medicare Other | Admitting: Vascular Surgery

## 2014-11-08 ENCOUNTER — Ambulatory Visit (HOSPITAL_COMMUNITY)
Admission: RE | Admit: 2014-11-08 | Discharge: 2014-11-08 | Disposition: A | Discharge status: Home / Self Care | Payer: Medicare Other | Source: Ambulatory Visit | Attending: Vascular Surgery | Admitting: Vascular Surgery

## 2014-11-08 ENCOUNTER — Encounter (HOSPITAL_COMMUNITY): Payer: Self-pay | Admitting: Nurse Practitioner

## 2014-11-08 ENCOUNTER — Other Ambulatory Visit: Payer: Self-pay

## 2014-11-08 ENCOUNTER — Emergency Department (HOSPITAL_COMMUNITY): Payer: Medicare Other

## 2014-11-08 ENCOUNTER — Observation Stay (HOSPITAL_COMMUNITY)
Admission: EM | Admit: 2014-11-08 | Discharge: 2014-11-09 | Disposition: A | Discharge status: Home / Self Care | Payer: Medicare Other | Attending: Internal Medicine | Admitting: Internal Medicine

## 2014-11-08 DIAGNOSIS — F1010 Alcohol abuse, uncomplicated: Secondary | ICD-10-CM | POA: Diagnosis not present

## 2014-11-08 DIAGNOSIS — Z86711 Personal history of pulmonary embolism: Secondary | ICD-10-CM | POA: Diagnosis present

## 2014-11-08 DIAGNOSIS — I872 Venous insufficiency (chronic) (peripheral): Secondary | ICD-10-CM

## 2014-11-08 DIAGNOSIS — K222 Esophageal obstruction: Secondary | ICD-10-CM | POA: Diagnosis not present

## 2014-11-08 DIAGNOSIS — K760 Fatty (change of) liver, not elsewhere classified: Secondary | ICD-10-CM | POA: Insufficient documentation

## 2014-11-08 DIAGNOSIS — R740 Nonspecific elevation of levels of transaminase and lactic acid dehydrogenase [LDH]: Secondary | ICD-10-CM

## 2014-11-08 DIAGNOSIS — Z881 Allergy status to other antibiotic agents status: Secondary | ICD-10-CM | POA: Insufficient documentation

## 2014-11-08 DIAGNOSIS — C50919 Malignant neoplasm of unspecified site of unspecified female breast: Secondary | ICD-10-CM | POA: Diagnosis present

## 2014-11-08 DIAGNOSIS — R0602 Shortness of breath: Secondary | ICD-10-CM

## 2014-11-08 DIAGNOSIS — I70213 Atherosclerosis of native arteries of extremities with intermittent claudication, bilateral legs: Secondary | ICD-10-CM | POA: Insufficient documentation

## 2014-11-08 DIAGNOSIS — R112 Nausea with vomiting, unspecified: Secondary | ICD-10-CM | POA: Diagnosis not present

## 2014-11-08 DIAGNOSIS — F419 Anxiety disorder, unspecified: Secondary | ICD-10-CM | POA: Diagnosis not present

## 2014-11-08 DIAGNOSIS — R040 Epistaxis: Secondary | ICD-10-CM | POA: Diagnosis not present

## 2014-11-08 DIAGNOSIS — K920 Hematemesis: Secondary | ICD-10-CM | POA: Diagnosis present

## 2014-11-08 DIAGNOSIS — Z853 Personal history of malignant neoplasm of breast: Secondary | ICD-10-CM | POA: Diagnosis not present

## 2014-11-08 DIAGNOSIS — R0902 Hypoxemia: Secondary | ICD-10-CM | POA: Diagnosis present

## 2014-11-08 DIAGNOSIS — F329 Major depressive disorder, single episode, unspecified: Secondary | ICD-10-CM | POA: Diagnosis not present

## 2014-11-08 DIAGNOSIS — I70219 Atherosclerosis of native arteries of extremities with intermittent claudication, unspecified extremity: Secondary | ICD-10-CM

## 2014-11-08 DIAGNOSIS — E785 Hyperlipidemia, unspecified: Secondary | ICD-10-CM | POA: Insufficient documentation

## 2014-11-08 DIAGNOSIS — R110 Nausea: Secondary | ICD-10-CM

## 2014-11-08 DIAGNOSIS — Z7901 Long term (current) use of anticoagulants: Secondary | ICD-10-CM | POA: Diagnosis not present

## 2014-11-08 DIAGNOSIS — R7401 Elevation of levels of liver transaminase levels: Secondary | ICD-10-CM

## 2014-11-08 DIAGNOSIS — R0600 Dyspnea, unspecified: Secondary | ICD-10-CM | POA: Diagnosis present

## 2014-11-08 DIAGNOSIS — I471 Supraventricular tachycardia: Secondary | ICD-10-CM | POA: Insufficient documentation

## 2014-11-08 DIAGNOSIS — R600 Localized edema: Secondary | ICD-10-CM

## 2014-11-08 DIAGNOSIS — R002 Palpitations: Secondary | ICD-10-CM | POA: Diagnosis not present

## 2014-11-08 HISTORY — DX: Other pulmonary embolism without acute cor pulmonale: I26.99

## 2014-11-08 HISTORY — DX: Anxiety disorder, unspecified: F41.9

## 2014-11-08 HISTORY — DX: Dependence on supplemental oxygen: Z99.81

## 2014-11-08 HISTORY — DX: Personal history of other diseases of the digestive system: Z87.19

## 2014-11-08 HISTORY — DX: Acute embolism and thrombosis of unspecified deep veins of unspecified lower extremity: I82.409

## 2014-11-08 HISTORY — DX: Supraventricular tachycardia: I47.1

## 2014-11-08 HISTORY — DX: Gastro-esophageal reflux disease without esophagitis: K21.9

## 2014-11-08 LAB — HEPATIC FUNCTION PANEL
ALK PHOS: 90 U/L (ref 39–117)
ALT: 39 U/L — AB (ref 0–35)
AST: 55 U/L — ABNORMAL HIGH (ref 0–37)
Albumin: 2.8 g/dL — ABNORMAL LOW (ref 3.5–5.2)
BILIRUBIN DIRECT: 0.5 mg/dL — AB (ref 0.0–0.3)
Indirect Bilirubin: 0.7 mg/dL (ref 0.3–0.9)
Total Bilirubin: 1.2 mg/dL (ref 0.3–1.2)
Total Protein: 6.3 g/dL (ref 6.0–8.3)

## 2014-11-08 LAB — CBC
HCT: 40.8 % (ref 36.0–46.0)
HEMATOCRIT: 43.9 % (ref 36.0–46.0)
Hemoglobin: 14.7 g/dL (ref 12.0–15.0)
Hemoglobin: 13.4 g/dL (ref 12.0–15.0)
MCH: 33 pg (ref 26.0–34.0)
MCH: 32.8 pg (ref 26.0–34.0)
MCHC: 33.5 g/dL (ref 30.0–36.0)
MCHC: 32.8 g/dL (ref 30.0–36.0)
MCV: 98.7 fL (ref 78.0–100.0)
MCV: 100 fL (ref 78.0–100.0)
PLATELETS: 231 10*3/uL (ref 150–400)
PLATELETS: 206 10*3/uL (ref 150–400)
RBC: 4.45 MIL/uL (ref 3.87–5.11)
RBC: 4.08 MIL/uL (ref 3.87–5.11)
RDW: 16.5 % — ABNORMAL HIGH (ref 11.5–15.5)
RDW: 16.8 % — ABNORMAL HIGH (ref 11.5–15.5)
WBC: 7.1 10*3/uL (ref 4.0–10.5)
WBC: 5.5 10*3/uL (ref 4.0–10.5)

## 2014-11-08 LAB — TSH: TSH: 3.33 u[IU]/mL (ref 0.350–4.500)

## 2014-11-08 LAB — BASIC METABOLIC PANEL
ANION GAP: 18 — AB (ref 5–15)
BUN: 8 mg/dL (ref 6–23)
CHLORIDE: 100 meq/L (ref 96–112)
CO2: 25 mEq/L (ref 19–32)
Calcium: 8.4 mg/dL (ref 8.4–10.5)
Creatinine, Ser: 0.99 mg/dL (ref 0.50–1.10)
GFR calc non Af Amer: 57 mL/min — ABNORMAL LOW (ref >90)
GFR, EST AFRICAN AMERICAN: 66 mL/min — AB (ref >90)
Glucose, Bld: 109 mg/dL — ABNORMAL HIGH (ref 70–99)
POTASSIUM: 4.2 meq/L (ref 3.7–5.3)
Sodium: 143 mEq/L (ref 137–147)

## 2014-11-08 LAB — TROPONIN I

## 2014-11-08 LAB — I-STAT TROPONIN, ED: Troponin i, poc: 0 ng/mL (ref 0.00–0.08)

## 2014-11-08 LAB — PRO B NATRIURETIC PEPTIDE: PRO B NATRI PEPTIDE: 219.1 pg/mL — AB (ref 0–125)

## 2014-11-08 MED ORDER — ALPRAZOLAM 0.25 MG PO TABS
0.5000 mg | ORAL_TABLET | Freq: Once | ORAL | Status: AC
  Administered 2014-11-08: 0.5 mg via ORAL
  Filled 2014-11-08: qty 2

## 2014-11-08 MED ORDER — IOHEXOL 350 MG/ML SOLN
80.0000 mL | Freq: Once | INTRAVENOUS | Status: AC | PRN
  Administered 2014-11-08: 80 mL via INTRAVENOUS

## 2014-11-08 MED ORDER — LORAZEPAM 1 MG PO TABS: 0.0000 mg | ORAL_TABLET | Freq: Four times a day (QID) | ORAL | Status: DC

## 2014-11-08 MED ORDER — LORAZEPAM 1 MG PO TABS: 1.0000 mg | ORAL_TABLET | Freq: Four times a day (QID) | ORAL | Status: DC | PRN

## 2014-11-08 MED ORDER — PANTOPRAZOLE SODIUM 40 MG IV SOLR
40.0000 mg | Freq: Two times a day (BID) | INTRAVENOUS | Status: DC
Start: 2014-11-08 — End: 2014-11-09
  Administered 2014-11-09: 40 mg via INTRAVENOUS
  Filled 2014-11-08 (×3): qty 40

## 2014-11-08 MED ORDER — ONDANSETRON HCL 4 MG PO TABS: 4.0000 mg | ORAL_TABLET | Freq: Four times a day (QID) | ORAL | Status: DC | PRN

## 2014-11-08 MED ORDER — THIAMINE HCL 100 MG/ML IJ SOLN
100.0000 mg | Freq: Every day | INTRAMUSCULAR | Status: DC
  Administered 2014-11-08: 100 mg via INTRAVENOUS
  Filled 2014-11-08 (×2): qty 1

## 2014-11-08 MED ORDER — LORAZEPAM 2 MG/ML IJ SOLN: 1.0000 mg | Freq: Four times a day (QID) | INTRAMUSCULAR | Status: DC | PRN

## 2014-11-08 MED ORDER — FOLIC ACID 1 MG PO TABS
1.0000 mg | ORAL_TABLET | Freq: Every day | ORAL | Status: DC
  Administered 2014-11-08 – 2014-11-09 (×2): 1 mg via ORAL
  Filled 2014-11-08 (×2): qty 1

## 2014-11-08 MED ORDER — ALBUTEROL SULFATE (2.5 MG/3ML) 0.083% IN NEBU: 2.5000 mg | INHALATION_SOLUTION | RESPIRATORY_TRACT | Status: DC | PRN

## 2014-11-08 MED ORDER — SODIUM CHLORIDE 0.9 % IJ SOLN
3.0000 mL | Freq: Two times a day (BID) | INTRAMUSCULAR | Status: DC
  Administered 2014-11-08 – 2014-11-09 (×2): 3 mL via INTRAVENOUS

## 2014-11-08 MED ORDER — ADULT MULTIVITAMIN W/MINERALS CH
1.0000 | ORAL_TABLET | Freq: Every day | ORAL | Status: DC
  Administered 2014-11-09: 1 via ORAL
  Filled 2014-11-08 (×2): qty 1

## 2014-11-08 MED ORDER — ACETAMINOPHEN 325 MG PO TABS: 650.0000 mg | ORAL_TABLET | Freq: Four times a day (QID) | ORAL | Status: DC | PRN

## 2014-11-08 MED ORDER — VITAMIN B-1 100 MG PO TABS
100.0000 mg | ORAL_TABLET | Freq: Every day | ORAL | Status: DC
  Administered 2014-11-09: 100 mg via ORAL
  Filled 2014-11-08 (×2): qty 1

## 2014-11-08 MED ORDER — ONDANSETRON HCL 4 MG/2ML IJ SOLN: 4.0000 mg | Freq: Four times a day (QID) | INTRAMUSCULAR | Status: DC | PRN

## 2014-11-08 MED ORDER — SODIUM CHLORIDE 0.9 % IV BOLUS (SEPSIS)
500.0000 mL | Freq: Once | INTRAVENOUS | Status: AC
  Administered 2014-11-08: 500 mL via INTRAVENOUS

## 2014-11-08 MED ORDER — ACETAMINOPHEN 650 MG RE SUPP: 650.0000 mg | Freq: Four times a day (QID) | RECTAL | Status: DC | PRN

## 2014-11-08 MED ORDER — LORAZEPAM 1 MG PO TABS: 0.0000 mg | ORAL_TABLET | Freq: Two times a day (BID) | ORAL | Status: DC

## 2014-11-08 MED ORDER — ZOLPIDEM TARTRATE 5 MG PO TABS
5.0000 mg | ORAL_TABLET | Freq: Once | ORAL | Status: AC
  Administered 2014-11-08: 5 mg via ORAL
  Filled 2014-11-08: qty 1

## 2014-11-08 MED ORDER — DESVENLAFAXINE SUCCINATE ER 50 MG PO TB24
50.0000 mg | ORAL_TABLET | Freq: Every day | ORAL | Status: DC
Start: 2014-11-09 — End: 2014-11-09
  Administered 2014-11-09: 50 mg via ORAL
  Filled 2014-11-08: qty 1

## 2014-11-08 MED ORDER — SODIUM CHLORIDE 0.9 % IV SOLN
INTRAVENOUS | Status: AC
  Administered 2014-11-09: via INTRAVENOUS

## 2014-11-08 NOTE — H&P (Signed)
[facility] History and Physical  Theresa [name]:[id number] DOB: 1946-06-28 DOA: 11/08/2014   PCP:  Melinda Crutch, MD  Specialists: She is followed by Dr. Deatra Ina with gastroenterology. Also follows with gastroenterologist in [facility]. She saw Dr. Lamonte Sakai with pulmonology once when she was diagnosed with PE in July. Dr. Julien Nordmann is her oncologist.  Chief Complaint: Palpitations/dizziness/vomiting blood  HPI: Theresa [name] is a 68 y.o. female with a past medical history of breast cancer currently in remission and not on any treatment, history of venous thromboembolism diagnosed in July of this year on Xarelto. She also has a history of esophageal stricture requiring periodic dilatations. The last one being in 2014. She also has chronic issues with nausea and vomiting. She was in her usual state of health till this past week when she was on a road trip to [place]. She came back on Friday and had 2 episodes of vomiting, both of which had some blood in it. She went to the emergency department and had blood work done and had chest x-ray. She was called later saying that her x-ray suggested pneumonia, so she was given a prescription for Z-Pak. Over the course of the weekend she felt fine and then yesterday she noticed that her heart was racing. She felt palpitations. She checked her pulse which was greater than 160. She decided to come in to the emergency department. And before she could be given any medications her heart rate spontaneously decreased. She came back home early this morning, went to sleep. Woke up tired but also noticed that she was dizzy. She felt weak. She then had nausea and vomited with some streaks of blood. Also had an episode of nosebleed. Denies any blood in the stools or black colored stools. She went to the vascular clinic for Doppler studies for peripheral artery disease. She felt palpitations there and was hyperventilating. She felt short of breath and had  transient left-sided chest pain. Denies any focal weakness. No syncopal episode. Subsequently, she was asked to come in to the emergency department. She denies any diarrhea. No abdominal pain. No fever, no chills. No sick contacts. No cough. No chest pain currently.  Home Medications: Prior to Admission medications   Medication Sig Start Date End Date Taking? Authorizing Provider  ALPRAZolam (XANAX) 0.25 MG tablet Take 0.25 mg by mouth daily.    Yes Historical Provider, MD  clobetasol ointment (TEMOVATE) 0.93 % Apply 1 application topically daily as needed (groin itch).  10/09/14  Yes Historical Provider, MD  desvenlafaxine (PRISTIQ) 50 MG 24 hr tablet Take 25 mg by mouth daily.   Yes Historical Provider, MD  omeprazole (PRILOSEC) 20 MG capsule Take 20 mg by mouth daily.    Yes Historical Provider, MD  ondansetron (ZOFRAN) 4 MG tablet Take 1 tablet (4 mg total) by mouth every 8 (eight) hours as needed for nausea or vomiting. 10/03/14  Yes Inda Castle, MD  pantoprazole (PROTONIX) 40 MG tablet Take 40 mg by mouth daily.  10/16/14  Yes Historical Provider, MD  promethazine (PHENERGAN) 25 MG tablet Take 25 mg by mouth every 6 (six) hours as needed for nausea.    Yes Historical Provider, MD  rivaroxaban (XARELTO) 20 MG TABS tablet Take 20 mg by mouth daily with supper.   Yes Historical Provider, MD    Allergies:  Allergies  Allergen Reactions  . Ciprofloxacin Hives    Past Medical History: Past Medical History  Diagnosis Date  . Stricture and stenosis of  esophagus   . Other and unspecified hyperlipidemia   . Dysthymic disorder   . Other and unspecified coagulation defects   . Malignant neoplasm of breast (female), unspecified site   . Diaphragmatic hernia without mention of obstruction or gangrene   . PAD (peripheral artery disease)   . PVD (peripheral vascular disease)   . History of pulmonary embolism 11/08/2014    Past Surgical History  Procedure Laterality Date  . Cholecystectomy     . Appendectomy    . Tubal ligation    . Breast lumpectomy      Right    Social History: She lives in [place] with her husband. Quit smoking 14 years ago. Has about 60-pack-year history of smoking. She has 2-3 margaritas on a daily basis. No illicit drug use. Independent with daily activities.  Family History:  Family History  Problem Relation Age of Onset  . Heart disease Father   . Heart attack Father   . Colon cancer Neg Hx   . Lung cancer Maternal Grandfather   . Deep vein thrombosis Daughter      Review of Systems - History obtained from the patient General ROS: positive for  - fatigue Psychological ROS: positive for - anxiety Ophthalmic ROS: negative ENT ROS: negative Allergy and Immunology ROS: negative Hematological and Lymphatic ROS: negative Endocrine ROS: negative Respiratory ROS: as in hpi Cardiovascular ROS: as in hpi Gastrointestinal ROS: as in hpi Genito-Urinary ROS: no dysuria, trouble voiding, or hematuria Musculoskeletal ROS: negative Neurological ROS: no TIA or stroke symptoms Dermatological ROS: negative  Physical Examination  Filed Vitals:   11/08/14 1745 11/08/14 1746 11/08/14 1800 11/08/14 1815  BP: 116/75  122/80 137/80  Pulse: 103  103 101  Temp:      TempSrc:      Resp: $Remo'12  21 18  'RFkaX$ Height:      Weight:      SpO2: 89% 100% 100% 100%    BP 137/80 mmHg  Pulse 101  Temp(Src) 97.4 F (36.3 C) (Oral)  Resp 18  Ht $R'5\' 2"'bG$  (1.575 m)  Wt 74.844 kg (165 lb)  BMI 30.17 kg/m2  SpO2 100%  General appearance: alert, cooperative, appears stated age and no distress Head: Normocephalic, without obvious abnormality, atraumatic Eyes: conjunctivae/corneas clear. PERRL, EOM's intact.  Throat: lips, mucosa, and tongue normal; teeth and gums normal Neck: no adenopathy, no carotid bruit, no JVD, supple, symmetrical, trachea midline and thyroid not enlarged, symmetric, no tenderness/mass/nodules Back: symmetric, no curvature. ROM normal. No CVA  tenderness. Resp: 3. Crackles bilateral bases without any wheezing or rhonchi. But mostly clear to auscultation. Cardio: regular rate and rhythm, S1, S2 normal, no murmur, click, rub or gallop GI: soft, non-tender; bowel sounds normal; no masses,  no organomegaly Extremities: extremities normal, atraumatic, no cyanosis or edema Pulses: 2+ and symmetric Skin: Skin color, texture, turgor normal. No rashes or lesions Neurologic: Alert and oriented 3. No facial asymmetry. No focal neurological deficits.  Laboratory Data: Results for orders placed or performed during the hospital encounter of 11/08/14 (from the past 48 hour(s))  CBC     Status: Abnormal   Collection Time: 11/08/14  1:48 PM  Result Value Ref Range   WBC 7.1 4.0 - 10.5 K/uL   RBC 4.45 3.87 - 5.11 MIL/uL   Hemoglobin 14.7 12.0 - 15.0 g/dL   HCT 43.9 36.0 - 46.0 %   MCV 98.7 78.0 - 100.0 fL   MCH 33.0 26.0 - 34.0 pg   MCHC 33.5 30.0 -  36.0 g/dL   RDW 16.5 (H) 11.5 - 15.5 %   Platelets 231 150 - 400 K/uL  Basic metabolic panel     Status: Abnormal   Collection Time: 11/08/14  1:48 PM  Result Value Ref Range   Sodium 143 137 - 147 mEq/L   Potassium 4.2 3.7 - 5.3 mEq/L   Chloride 100 96 - 112 mEq/L   CO2 25 19 - 32 mEq/L   Glucose, Bld 109 (H) 70 - 99 mg/dL   BUN 8 6 - 23 mg/dL   Creatinine, Ser 0.99 0.50 - 1.10 mg/dL   Calcium 8.4 8.4 - 10.5 mg/dL   GFR calc non Af Amer 57 (L) >90 mL/min   GFR calc Af Amer 66 (L) >90 mL/min    Comment: (NOTE) The eGFR has been calculated using the CKD EPI equation. This calculation has not been validated in all clinical situations. eGFR's persistently <90 mL/min signify possible Chronic Kidney Disease.    Anion gap 18 (H) 5 - 15  Pro b natriuretic peptide (BNP)     Status: Abnormal   Collection Time: 11/08/14  1:48 PM  Result Value Ref Range   Pro B Natriuretic peptide (BNP) 219.1 (H) 0 - 125 pg/mL  I-stat troponin, ED     Status: None   Collection Time: 11/08/14  2:00 PM    Result Value Ref Range   Troponin i, poc 0.00 0.00 - 0.08 ng/mL   Comment 3            Comment: Due to the release kinetics of cTnI, a negative result within the first hours of the onset of symptoms does not rule out myocardial infarction with certainty. If myocardial infarction is still suspected, repeat the test at appropriate intervals.     Radiology Reports: Dg Chest 2 View  11/07/2014   CLINICAL DATA:  68 year old female with shortness of breath. Initial encounter.  EXAM: CHEST  2 VIEW  COMPARISON:  11/03/2014 and prior chest radiographs  FINDINGS: The cardiomediastinal silhouette is unremarkable.  There is no evidence of focal airspace disease, pulmonary edema, suspicious pulmonary nodule/mass, pleural effusion, or pneumothorax. No acute bony abnormalities are identified.  Defibrillator pads overlying the chest are noted.  IMPRESSION: No active cardiopulmonary disease.   Electronically Signed   By: Hassan Rowan M.D.   On: 11/07/2014 23:21   Ct Angio Chest Pe W/cm &/or Wo Cm  11/08/2014   CLINICAL DATA:  Shortness of breath with hemoptysis. History of DVT and pulmonary embolism. Initial encounter.  EXAM: CT ANGIOGRAPHY CHEST WITH CONTRAST  TECHNIQUE: Multidetector CT imaging of the chest was performed using the standard protocol during bolus administration of intravenous contrast. Multiplanar CT image reconstructions and MIPs were obtained to evaluate the vascular anatomy.  CONTRAST:  52mL OMNIPAQUE IOHEXOL 350 MG/ML SOLN  COMPARISON:  Chest CTA 06/15/2014.  FINDINGS: Vascular: The pulmonary arteries are well opacified with contrast. There is minimal residual intimal irregularity at sites of the previously demonstrated pulmonary embolism bilaterally. No recurrent acute pulmonary emboli are demonstrated. The central pulmonary arteries are patent. There are no signs of acute right ventricular strain. Moderate atherosclerosis of the aorta, great vessels and coronary arteries is again noted. The  heart size is normal. There is no pericardial effusion.  Mediastinum: Small mediastinal and hilar lymph nodes are stable. The esophagus and trachea demonstrate no significant findings. There is a stable small hiatal hernia.  Lungs/Pleura: There is no pleural effusion. Emphysema and scattered peripheral fibrotic changes in both lungs  are stable. There is no confluent airspace opacity or suspicious pulmonary nodule.  Upper abdomen: Severe hepatic steatosis appears progressive. The liver density is -6 HU. No focal lesions are apparent. The gallbladder surgically absent. Small lymph nodes within the porta hepatis appears stable. There is no evidence of adrenal mass.  Musculoskeletal/Chest wall: No chest wall lesion or acute osseous findings.  Review of the MIP images confirms the above findings.  IMPRESSION: 1. Sequela of prior thromboembolic disease with mild pulmonary arterial intimal irregularity bilaterally. No evidence of recurrent acute pulmonary embolism. 2. Stable diffuse atherosclerosis. 3. No acute findings identified. 4. Progressive severe hepatic steatosis.   Electronically Signed   By: Camie Patience M.D.   On: 11/08/2014 16:51    Electrocardiogram: Sinus tachycardia at 105 bpm. Normal axis. Possible borderline QT prolongation. Nonspecific ST changes in V4, V5, similar to previous findings  Problem List  Principal Problem:   Hypoxia Active Problems:   Malignant neoplasm of female breast   ESOPHAGEAL STRICTURE   Atherosclerosis of native arteries of extremity with intermittent claudication   Dyspnea   Hematemesis   History of pulmonary embolism   Alcohol abuse   SVT (supraventricular tachycardia)   Assessment: This is a 68 year old Caucasian female who presents with multiple symptoms as discussed earlier. There was some confusion as to whether she was experiencing hemoptysis or hematemesis. The patient is certain that she was vomiting blood. This is concerning due to the fact that she is on  anticoagulation. She has also had other symptoms including lightheadedness, shortness of breath and had an episode of SVT yesterday. She was also noted to be hypoxic, especially with exertion in the emergency department. Reason for her presentation is not entirely clear.  Plan: #1 Possible and Questionable hematemesis: This will need further workup. I have consulted Dr. Deatra Ina with gastroenterology and he will consult in the morning. We will keep her on clear liquids and then keep her nothing by mouth after midnight. PPI will be given. Hold Xarelto for tonight. Check CBCs. No need to initiate reversal protocol as her episodes appeared to be mild.  #2 Dyspnea with exertional hypoxia/palpitations/history of SVT: Etiology for her symptoms not clear. Repeat CT scan was negative for new pulmonary embolism. An echocardiogram was done in July which revealed grade 1 diastolic dysfunction with systolic function was normal. At this time, there is enough reason to repeat this study. We'll also check thyroid function test. Monitor her on telemetry. Check troponin. She does have a significant history of smoking in the past. Has never been diagnosed with COPD, but this is likely contributing to her symptoms. PFT's as outpatient.  #3 Transaminitis: Her LFTs were abnormal few days ago. We will repeat tonight. We'll check ultrasound of her abdomen considering her history of alcohol use, nausea and abnormal LFTs. Hepatic steatosis was noted on the CT done earlier today. Hepatitis panel will also be ordered.  #4 Alcohol abuse: She drinks 3 drinks every night. This is excessive for her. Also concerning because she is on anticoagulation. We will place her on alcohol withdrawal protocol. She was counseled to quit drinking completely.  #5 history of pulmonary embolism in July of this year: She is on Xarelto. She is supposed to take this for at least one year from July 2015. CT scan tonight did not show new clots. This is  somewhat reassuring, as we will need to hold Xarelto for now.   #6 history of breast cancer: Currently not on treatment.  DVT Prophylaxis: SCDs  Code Status: Full code Family Communication: Discussed with the patient and her husband  Disposition Plan: Observe to telemetry   Further management decisions will depend on results of further testing and patient's response to treatment.   [facility]  [facility] Pager [phone]  If 7PM-7AM, please contact night-coverage [email or web address] Password [facility] [place]  11/08/2014, 6:33 PM

## 2014-11-08 NOTE — ED Notes (Signed)
Pt ambulated to bathroom with minimal assistance. Pt walked back to room and was placed on the monitor. Pts O2 stats were down to 80%. Pt placed back on O2 and stats came back up to 96% on 2L. Notified RN and MD.

## 2014-11-08 NOTE — ED Notes (Addendum)
Per EMS pt was walking for into vascular for PVD check-up and had an episode of dizziness, shortness of breath. Patient assisted to seat and continued to hyperventilating 40 breaths per min upon ems arrival- pt then got lightheaded and c/o numbness and tingling on bilateral arms and around mouth and nose. EMS was able to talk with patient and get breathing under control. Pt sts numbness and tingling suubsided everywhere but right hand is intermittent. Pt has a history of anxiety and was seen here last night for SVT. Pt. sts symptoms present are not similar to last night.    Pt. Endorses one episode of bright red blood this morning and 4/10 dull left sided lower chest pain that radiates to back started this morning.

## 2014-11-08 NOTE — Progress Notes (Signed)
Pt. at office for vascular study for PVD and 1 yr. F/u.  While in lobby, awaiting appt., c/o dizziness and SOB.  Husband @ pt's. Side.  Assisted to exam room at 12:40 PM; BP 135/84. P. 135, R. 22, O2 Sat 98 % on 2 L per [place].  Skin warm/dry. Color pale. Alert/ oriented  Reported that she was in the [facility] ER last night for a "fast heart rate."  C/o an "uncomfortable" feeling beneath left breast.  Resp. more labored.  Called EMS.  12:45 PM: BP 141/91, P.124, Resp 32.  O2 Sat 99%.  Skin remains warm/dry.  Denies any change in the pain beneath left breast.  C/o (R) hand feeling tingly, and difficulty moving her thumb.  EMS arrived @ 12:51 PM.  Pt. transported to [facility] ER.  Husband here and aware.

## 2014-11-08 NOTE — ED Notes (Signed)
Dr. Mingo Amber at bedside updating husband on plan of care. Pt at CT scan

## 2014-11-08 NOTE — ED Provider Notes (Signed)
CSN: [id number]     Arrival date & time 11/08/14  1320 History   First MD Initiated Contact with Patient 11/08/14 1321     Chief Complaint  Patient presents with  . Shortness of Breath  . Dizziness     (Consider location/radiation/quality/duration/timing/severity/associated sxs/prior Treatment) Patient is a 68 y.o. female presenting with shortness of breath and dizziness. The history is provided by the patient.  Shortness of Breath Severity:  Moderate Onset quality:  Sudden Timing:  Constant Progression:  Unchanged Context: not emotional upset and not URI   Relieved by:  Nothing Worsened by:  Nothing tried Associated symptoms: hemoptysis   Associated symptoms: no abdominal pain, no cough, no fever, no syncope and no vomiting   Associated symptoms comment:  Dizziness Dizziness Associated symptoms: palpitations   Associated symptoms: no shortness of breath, no syncope and no vomiting     Past Medical History  Diagnosis Date  . Stricture and stenosis of esophagus   . Other and unspecified hyperlipidemia   . Dysthymic disorder   . Other and unspecified coagulation defects   . Malignant neoplasm of breast (female), unspecified site   . Diaphragmatic hernia without mention of obstruction or gangrene   . PAD (peripheral artery disease)   . PVD (peripheral vascular disease)    Past Surgical History  Procedure Laterality Date  . Cholecystectomy    . Appendectomy    . Tubal ligation    . Breast lumpectomy      Right   Family History  Problem Relation Age of Onset  . Heart disease Father   . Heart attack Father   . Colon cancer Neg Hx   . Lung cancer Maternal Grandfather   . Deep vein thrombosis Daughter    History  Substance Use Topics  . Smoking status: Former Smoker -- 1.50 packs/day for 38 years    Types: Cigarettes    Quit date: 12/08/1998  . Smokeless tobacco: Never Used  . Alcohol Use: 7.0 oz/week    14 Not specified per week     Comment: 2 drinks per night    OB History    No data available     Review of Systems  Constitutional: Negative for fever and chills.  Respiratory: Positive for hemoptysis. Negative for cough and shortness of breath.   Cardiovascular: Positive for palpitations. Negative for leg swelling and syncope.  Gastrointestinal: Negative for vomiting and abdominal pain.  Neurological: Positive for dizziness.  All other systems reviewed and are negative.     Allergies  Ciprofloxacin  Home Medications   Prior to Admission medications   Medication Sig Start Date End Date Taking? Authorizing Provider  ALPRAZolam (XANAX) 0.25 MG tablet Take 0.25 mg by mouth daily.     Historical Provider, MD  azithromycin (ZITHROMAX) 250 MG tablet Take 250-500 mg by mouth daily. For 5 days    Historical Provider, MD  clobetasol ointment (TEMOVATE) 8.10 % Apply 1 application topically daily as needed (groin itch).  10/09/14   Historical Provider, MD  desvenlafaxine (PRISTIQ) 50 MG 24 hr tablet Take 25 mg by mouth daily.    Historical Provider, MD  escitalopram (LEXAPRO) 10 MG tablet Take 5 mg by mouth daily.    Historical Provider, MD  mirtazapine (REMERON) 30 MG tablet Take 30 mg by mouth at bedtime.    Historical Provider, MD  Multiple Vitamins-Minerals (MULTIVITAMINS) CHEW Chew 1 tablet by mouth daily.    Historical Provider, MD  omeprazole (PRILOSEC) 20 MG capsule Take 20 mg  by mouth daily.     Historical Provider, MD  ondansetron (ZOFRAN) 4 MG tablet Take 1 tablet (4 mg total) by mouth every 8 (eight) hours as needed for nausea or vomiting. 10/03/14   Inda Castle, MD  pantoprazole (PROTONIX) 40 MG tablet Take 40 mg by mouth daily.  10/16/14   Historical Provider, MD  promethazine (PHENERGAN) 25 MG tablet Take 25 mg by mouth every 6 (six) hours as needed for nausea.     Historical Provider, MD  Rivaroxaban (XARELTO) 15 MG TABS tablet Take 15 mg by mouth 2 (two) times daily with a meal. Patient was taking for 21 days and was to then switch  to 20mg  daily.    Historical Provider, MD  rivaroxaban (XARELTO) 20 MG TABS tablet Take 20 mg by mouth daily with supper.    Historical Provider, MD   BP 128/90 mmHg  Pulse 95  Temp(Src) 97.4 F (36.3 C) (Oral)  Resp 20  Ht 5\' 2"  (1.575 m)  Wt 165 lb (74.844 kg)  BMI 30.17 kg/m2  SpO2 95% Physical Exam  Constitutional: She is oriented to person, place, and time. She appears well-developed and well-nourished. No distress.  HENT:  Head: Normocephalic and atraumatic.  Mouth/Throat: Oropharynx is clear and moist.  Eyes: EOM are normal. Pupils are equal, round, and reactive to light.  Neck: Normal range of motion. Neck supple.  Cardiovascular: Normal rate and regular rhythm.  Exam reveals no friction rub.   No murmur heard. Pulmonary/Chest: Effort normal and breath sounds normal. No respiratory distress. She has no wheezes. She has no rales.  Abdominal: Soft. She exhibits no distension. There is no tenderness. There is no rebound.  Musculoskeletal: Normal range of motion. She exhibits no edema.  Neurological: She is alert and oriented to person, place, and time.  Skin: No rash noted. She is not diaphoretic.  Nursing note and vitals reviewed.   ED Course  Procedures (including critical care time) Labs Review Labs Reviewed  [place], ED    Imaging Review Dg Chest 2 View  11/07/2014   CLINICAL DATA:  68 year old female with shortness of breath. Initial encounter.  EXAM: CHEST  2 VIEW  COMPARISON:  11/03/2014 and prior chest radiographs  FINDINGS: The cardiomediastinal silhouette is unremarkable.  There is no evidence of focal airspace disease, pulmonary edema, suspicious pulmonary nodule/mass, pleural effusion, or pneumothorax. No acute bony abnormalities are identified.  Defibrillator pads overlying the chest are noted.  IMPRESSION: No active cardiopulmonary disease.   Electronically Signed   By: Hassan Rowan M.D.   On:  11/07/2014 23:21     EKG Interpretation None       Date: 11/08/2014  Rate: 105  Rhythm: sinus tachycardia  QRS Axis: normal  Intervals: QT prolonged  ST/T Wave abnormalities: nonspecific T wave changes  Conduction Disutrbances:none  Narrative Interpretation:   Old EKG Reviewed: unchanged    MDM   Final diagnoses:  Shortness of breath    22F here with SOB. Beganthis morning, associated dizziness. Seen last night for SVT, self limited. Associated hemoptysis and mild epistaxis this morning also. No CP. Here vitals show tachycardia, BP ok. Lungs clear. Mild labored respirations.  Plan for repeat PE scan with her tachycardia and hemoptysis. Desatted to 86% while ambulating up to the restroom.  Dr. Wyvonnia Dusky will await her CT results with plan to admit.  Evelina Bucy, MD 11/08/14 [phone]

## 2014-11-08 NOTE — ED Notes (Signed)
Admitting- Dr. Maryland Pink at bedside.

## 2014-11-09 ENCOUNTER — Observation Stay (HOSPITAL_COMMUNITY): Payer: Medicare Other

## 2014-11-09 ENCOUNTER — Encounter (HOSPITAL_COMMUNITY): Payer: Self-pay | Admitting: Physician Assistant

## 2014-11-09 DIAGNOSIS — R110 Nausea: Secondary | ICD-10-CM | POA: Diagnosis not present

## 2014-11-09 DIAGNOSIS — K222 Esophageal obstruction: Secondary | ICD-10-CM

## 2014-11-09 DIAGNOSIS — K920 Hematemesis: Secondary | ICD-10-CM | POA: Diagnosis not present

## 2014-11-09 DIAGNOSIS — R0902 Hypoxemia: Secondary | ICD-10-CM | POA: Diagnosis not present

## 2014-11-09 LAB — COMPREHENSIVE METABOLIC PANEL
ALT: 34 U/L (ref 0–35)
AST: 53 U/L — ABNORMAL HIGH (ref 0–37)
Albumin: 2.6 g/dL — ABNORMAL LOW (ref 3.5–5.2)
Alkaline Phosphatase: 84 U/L (ref 39–117)
Anion gap: 13 (ref 5–15)
BILIRUBIN TOTAL: 1.6 mg/dL — AB (ref 0.3–1.2)
BUN: 10 mg/dL (ref 6–23)
CALCIUM: 7.5 mg/dL — AB (ref 8.4–10.5)
CHLORIDE: 102 meq/L (ref 96–112)
CO2: 26 meq/L (ref 19–32)
CREATININE: 0.78 mg/dL (ref 0.50–1.10)
GFR, EST NON AFRICAN AMERICAN: 83 mL/min — AB (ref >90)
GLUCOSE: 88 mg/dL (ref 70–99)
Potassium: 4 mEq/L (ref 3.7–5.3)
Sodium: 141 mEq/L (ref 137–147)
Total Protein: 6 g/dL (ref 6.0–8.3)

## 2014-11-09 LAB — CBC
HCT: 38.8 % (ref 36.0–46.0)
HCT: 40 % (ref 36.0–46.0)
HEMOGLOBIN: 12.8 g/dL (ref 12.0–15.0)
Hemoglobin: 13.6 g/dL (ref 12.0–15.0)
MCH: 33 pg (ref 26.0–34.0)
MCH: 34.4 pg — ABNORMAL HIGH (ref 26.0–34.0)
MCHC: 33 g/dL (ref 30.0–36.0)
MCHC: 34 g/dL (ref 30.0–36.0)
MCV: 100 fL (ref 78.0–100.0)
MCV: 101.3 fL — AB (ref 78.0–100.0)
PLATELETS: 184 10*3/uL (ref 150–400)
PLATELETS: 190 10*3/uL (ref 150–400)
RBC: 3.88 MIL/uL (ref 3.87–5.11)
RBC: 3.95 MIL/uL (ref 3.87–5.11)
RDW: 16.8 % — ABNORMAL HIGH (ref 11.5–15.5)
RDW: 16.9 % — AB (ref 11.5–15.5)
WBC: 5.5 10*3/uL (ref 4.0–10.5)
WBC: 7.3 10*3/uL (ref 4.0–10.5)

## 2014-11-09 LAB — TROPONIN I: Troponin I: <0.3 ng/mL (ref <0.30)

## 2014-11-09 LAB — T4, FREE: Free T4: 1.32 ng/dL (ref 0.80–1.80)

## 2014-11-09 LAB — HEPATITIS PANEL, ACUTE
HCV AB: NEGATIVE
Hep A IgM: NONREACTIVE
Hep B C IgM: NONREACTIVE
Hepatitis B Surface Ag: NEGATIVE

## 2014-11-09 LAB — PROTIME-INR
INR: 1.17 (ref 0.00–1.49)
PROTHROMBIN TIME: 15 s (ref 11.6–15.2)

## 2014-11-09 LAB — APTT: aPTT: 29 seconds (ref 24–37)

## 2014-11-09 MED ORDER — ALPRAZOLAM 0.25 MG PO TABS
0.2500 mg | ORAL_TABLET | Freq: Once | ORAL | Status: AC
  Administered 2014-11-09: 0.25 mg via ORAL
  Filled 2014-11-09: qty 1

## 2014-11-09 MED ORDER — PANTOPRAZOLE SODIUM 40 MG PO TBEC: 40.0000 mg | DELAYED_RELEASE_TABLET | Freq: Two times a day (BID) | ORAL | Status: DC

## 2014-11-09 MED ORDER — PANTOPRAZOLE SODIUM 40 MG PO TBEC: 40.0000 mg | DELAYED_RELEASE_TABLET | Freq: Every day | ORAL | Status: DC

## 2014-11-09 NOTE — Progress Notes (Signed)
Echocardiogram 2D Echocardiogram has been performed.  Theresa [name] 11/09/2014, 12:43 PM

## 2014-11-09 NOTE — Progress Notes (Signed)
Discharge instructions given. Pt verbalized understanding and all questions were answered.  

## 2014-11-09 NOTE — Plan of Care (Signed)
Problem: Phase I Progression Outcomes Goal: OOB as tolerated unless otherwise ordered Outcome: Progressing     

## 2014-11-09 NOTE — Progress Notes (Signed)
UR completed 

## 2014-11-09 NOTE — Discharge Instructions (Signed)
Chemical Dependency  Chemical dependency is an addiction to drugs or alcohol. It is characterized by the repeated behavior of seeking out and using drugs and alcohol despite harmful consequences to the health and safety of ones self and others.   RISK FACTORS  There are certain situations or behaviors that increase a person's risk for chemical dependency. These include:  · A family history of chemical dependency.  · A history of mental health issues, including depression and anxiety.  · A home environment where drugs and alcohol are easily available to you.  · Drug or alcohol use at a young age.  SYMPTOMS   The following symptoms can indicate chemical dependency:  · Inability to limit the use of drugs or alcohol.  · Nausea, sweating, shakiness, and anxiety that occurs when alcohol or drugs are not being used.  · An increase in amount of drugs or alcohol that is necessary to get drunk or high.  People who experience these symptoms can assess their use of drugs and alcohol by asking themselves the following questions:  · Have you been told by friends or family that they are worried about your use of alcohol or drugs?  · Do friends and family ever tell you about things you did while drinking alcohol or using drugs that you do not remember?  · Do you lie about using alcohol or drugs or about the amounts you use?  · Do you have difficulty completing daily tasks unless you use alcohol or drugs?  · Is the level of your work or school performance lower because of your drug or alcohol use?  · Do you get sick from using drugs or alcohol but keep using anyway?  · Do you feel uncomfortable in social situations unless you use alcohol or drugs?  · Do you use drugs or alcohol to help forget problems?   An answer of yes to any of these questions may indicate chemical dependency. Professional evaluation is suggested.  Document Released: 11/18/2001 Document Revised: 02/16/2012 Document Reviewed: 01/30/2011  ExitCare® Patient  Information ©2015 [employer]. This information is not intended to replace advice given to you by your health care provider. Make sure you discuss any questions you have with your health care provider.

## 2014-11-09 NOTE — Plan of Care (Signed)
Problem: Phase I Progression Outcomes Goal: Voiding-avoid urinary catheter unless indicated Outcome: Completed/Met Date Met:  11/09/14

## 2014-11-09 NOTE — Discharge Summary (Signed)
Physician Discharge Summary  Theresa [name]:[id number] DOB: Apr 07, 1946 DOA: 11/08/2014  PCP:  Melinda Crutch, MD  Admit date: 11/08/2014 Discharge date: 11/09/2014  Time spent: 25 minutes  Recommendations for Outpatient Follow-up:  1. Discharge home with PCP follow up in 1 week 2.  follow up with Dr Deatra Ina as needed  Discharge Diagnoses:  Principal Problem:   Hematemesis  Active Problems:   Malignant neoplasm of female breast   ESOPHAGEAL STRICTURE   Atherosclerosis of native arteries of extremity with intermittent claudication   Hypoxia   Dyspnea   History of pulmonary embolism   Alcohol abuse   SVT (supraventricular tachycardia)   Discharge Condition: fair  Diet recommendation: regular  Filed Weights   11/08/14 1328 11/08/14 1853 11/09/14 0418  Weight: 74.844 kg (165 lb) 69.6 kg (153 lb 7 oz) 62.052 kg (136 lb 12.8 oz)    History of present illness:  68 y.o. female with a past medical history of breast cancer currently in remission and not on any treatment, history of venous thromboembolism diagnosed in July of this year on Xarelto. She also has a history of esophageal stricture requiring periodic dilatations. The last one being in 2014. She also has chronic issues with nausea and vomiting. She was in her usual state of health till 1 week prior to admission when she was on a road trip to [place]. She came back on 11/27 and had 2 episodes of vomiting, both of which had some blood in it. She went to the emergency department and had blood work done and  chest x-ray. She was called later saying that her x-ray suggested pneumonia, so she was given a prescription for Z-Pak. During the weekend she felt fine and 1 day prior to admission she noticed that her heart was racing. She also felt palpitations. She checked her pulse which was greater than 160. On the day of admission had nausea and vomited with some streaks of blood. Also had an episode of nosebleed. Denies any blood in  the stools or black colored stools. She went to the vascular clinic for Doppler studies for peripheral artery disease. She felt palpitations there and was hyperventilating. She felt short of breath and had transient left-sided chest pain. She was then told to come to the ED.  Vitals in the ED and hb were stable.  patient admitted for further w/up  Hospital Course:  Hematemesis  appears to be due to retching and mild mallory weiss tear. H&H remains stable. Patient has ongoing etoh use and consoled on cessation. No further symptoms. Patient placed on Bid PPI. ( was taking once daily prilosec 20 mg only).will be discharged on it for 1 month and adjusted as outpt Resume xarelto. Seen by GI and recommend no intervention. She will follow up with her PCP as outpt. Also should follow up with Dr Deatra Ina for recurrence of symptoms.  Exertional dyspnea/ palpitations  no further symptoms. No clear etiology. CXR normal. Stable on tele. Echo with normal EF. No sings of volume overload. TSH normal. If symptoms persistent may need PFTs as outpt  transaminitis Secondary to fatty liver. counseled on etoh cessation   Hx of PE in jul 2015  on xarelto. Continue    Hx of breat ca Not on treatment. Follow as outpt  Procedures:  NONE  Consultations:  [facility]  Discharge Exam: Filed Vitals:   11/09/14 1416  BP: 130/79  Pulse: 107  Temp: 99 F (37.2 C)  Resp: 18    General: elderly  female in NAD HEENT: no pallor, moist mucosa  chest: clear b/l , no added sounds CVS: NS1&S2, no murmurs Abd: soft, NT, ND, BS+ Ext: warm, no edema   Discharge Instructions You were cared for by a hospitalist during your hospital stay. If you have any questions about your discharge medications or the care you received while you were in the hospital after you are discharged, you can call the unit and asked to speak with the hospitalist on call if the hospitalist that took care of you is not available. Once you  are discharged, your primary care physician will handle any further medical issues. Please note that NO REFILLS for any discharge medications will be authorized once you are discharged, as it is imperative that you return to your primary care physician (or establish a relationship with a primary care physician if you do not have one) for your aftercare needs so that they can reassess your need for medications and monitor your lab values.   Current Discharge Medication List    CONTINUE these medications which have CHANGED   Details  pantoprazole (PROTONIX) 40 MG tablet Take 1 tablet (40 mg total) by mouth daily. Qty: 60 tablet, Refills: 0      CONTINUE these medications which have NOT CHANGED   Details  ALPRAZolam (XANAX) 0.25 MG tablet Take 0.25 mg by mouth daily.     clobetasol ointment (TEMOVATE) 8.56 % Apply 1 application topically daily as needed (groin itch).  Refills: 1   Associated Diagnoses: Need for prophylactic vaccination and inoculation against influenza; Acute pulmonary embolus; Malignant neoplasm of female breast, right    desvenlafaxine (PRISTIQ) 50 MG 24 hr tablet Take 25 mg by mouth daily.    ondansetron (ZOFRAN) 4 MG tablet Take 1 tablet (4 mg total) by mouth every 8 (eight) hours as needed for nausea or vomiting. Qty: 30 tablet, Refills: 1    promethazine (PHENERGAN) 25 MG tablet Take 25 mg by mouth every 6 (six) hours as needed for nausea.    Associated Diagnoses: Need for prophylactic vaccination and inoculation against influenza; Acute pulmonary embolus; Malignant neoplasm of female breast, right    rivaroxaban (XARELTO) 20 MG TABS tablet Take 20 mg by mouth daily with supper.      STOP taking these medications     omeprazole (PRILOSEC) 20 MG capsule        Allergies  Allergen Reactions  . Ciprofloxacin Hives      The results of significant diagnostics from this hospitalization (including imaging, microbiology, ancillary and laboratory) are listed  below for reference.    Significant Diagnostic Studies: Dg Chest 2 View  11/07/2014   CLINICAL DATA:  68 year old female with shortness of breath. Initial encounter.  EXAM: CHEST  2 VIEW  COMPARISON:  11/03/2014 and prior chest radiographs  FINDINGS: The cardiomediastinal silhouette is unremarkable.  There is no evidence of focal airspace disease, pulmonary edema, suspicious pulmonary nodule/mass, pleural effusion, or pneumothorax. No acute bony abnormalities are identified.  Defibrillator pads overlying the chest are noted.  IMPRESSION: No active cardiopulmonary disease.   Electronically Signed   By: Hassan Rowan M.D.   On: 11/07/2014 23:21   Dg Chest 2 View  11/03/2014   CLINICAL DATA:  Cough, spitting at blood for 2 days  EXAM: CHEST  2 VIEW  COMPARISON:  06/15/2014  FINDINGS: Cardiomediastinal silhouette is stable. No pulmonary edema. Subtle streaky left basilar atelectasis or early infiltrate.  IMPRESSION: No pulmonary edema. Subtle streaky left basilar atelectasis or early  infiltrate.   Electronically Signed   By: Lahoma Crocker M.D.   On: 11/03/2014 19:11   Ct Angio Chest Pe W/cm &/or Wo Cm  11/08/2014   CLINICAL DATA:  Shortness of breath with hemoptysis. History of DVT and pulmonary embolism. Initial encounter.  EXAM: CT ANGIOGRAPHY CHEST WITH CONTRAST  TECHNIQUE: Multidetector CT imaging of the chest was performed using the standard protocol during bolus administration of intravenous contrast. Multiplanar CT image reconstructions and MIPs were obtained to evaluate the vascular anatomy.  CONTRAST:  3mL OMNIPAQUE IOHEXOL 350 MG/ML SOLN  COMPARISON:  Chest CTA 06/15/2014.  FINDINGS: Vascular: The pulmonary arteries are well opacified with contrast. There is minimal residual intimal irregularity at sites of the previously demonstrated pulmonary embolism bilaterally. No recurrent acute pulmonary emboli are demonstrated. The central pulmonary arteries are patent. There are no signs of acute right ventricular  strain. Moderate atherosclerosis of the aorta, great vessels and coronary arteries is again noted. The heart size is normal. There is no pericardial effusion.  Mediastinum: Small mediastinal and hilar lymph nodes are stable. The esophagus and trachea demonstrate no significant findings. There is a stable small hiatal hernia.  Lungs/Pleura: There is no pleural effusion. Emphysema and scattered peripheral fibrotic changes in both lungs are stable. There is no confluent airspace opacity or suspicious pulmonary nodule.  Upper abdomen: Severe hepatic steatosis appears progressive. The liver density is -6 HU. No focal lesions are apparent. The gallbladder surgically absent. Small lymph nodes within the porta hepatis appears stable. There is no evidence of adrenal mass.  Musculoskeletal/Chest wall: No chest wall lesion or acute osseous findings.  Review of the MIP images confirms the above findings.  IMPRESSION: 1. Sequela of prior thromboembolic disease with mild pulmonary arterial intimal irregularity bilaterally. No evidence of recurrent acute pulmonary embolism. 2. Stable diffuse atherosclerosis. 3. No acute findings identified. 4. Progressive severe hepatic steatosis.   Electronically Signed   By: Camie Patience M.D.   On: 11/08/2014 16:51   US Abdomen Complete  11/09/2014   CLINICAL DATA:  Elevated liver function tests.  EXAM: ULTRASOUND ABDOMEN COMPLETE  COMPARISON:  CT scan of the abdomen dated 04/21/2013 and abdominal ultrasound dated 03/29/2013  FINDINGS: Gallbladder: Removed  Common bile duct: Diameter: 10.6 mm, unchanged since the prior CT scan. No dilated intrahepatic bile ducts.  Liver: No focal lesions. Diffuse increased echogenicity of the liver parenchyma consistent with hepatic steatosis, as demonstrated on the prior CT scan.  IVC: No abnormality visualized.  Pancreas: Visualized portion unremarkable.  Spleen: 3.9 cm, normal.  Right Kidney: Length: 10.1 cm. Echogenicity within normal limits. No mass or  hydronephrosis visualized.  Left Kidney: Length: 10.2 cm. Echogenicity within normal limits. No mass or hydronephrosis visualized.  Abdominal aorta: No aneurysm visualized.  2.3 cm maximum diameter.  Other findings: None.  IMPRESSION: Hepatic steatosis. Otherwise normal exam. Gallbladder has been removed.   Electronically Signed   By: Rozetta Nunnery M.D.   On: 11/09/2014 08:33    Microbiology: No results found for this or any previous visit (from the past 240 hour(s)).   Labs: Basic Metabolic Panel:  Recent Labs Lab 11/03/14 1856 11/07/14 2154 11/08/14 1348 11/09/14 0708  NA 139 140 143 141  K 3.8 3.6* 4.2 4.0  CL 97 96 100 102  CO2 29 25 25 26   GLUCOSE 87 76 109* 88  BUN 8 7 8 10   CREATININE 0.74 1.07 0.99 0.78  CALCIUM 9.0 8.3* 8.4 7.5*   Liver Function Tests:  Recent Labs  Lab 11/03/14 1856 11/08/14 1935 11/09/14 0708  AST 79* 55* 53*  ALT 49* 39* 34  ALKPHOS 98 90 84  BILITOT 0.9 1.2 1.6*  PROT 6.7 6.3 6.0  ALBUMIN 2.9* 2.8* 2.6*    Recent Labs Lab 11/03/14 1856  LIPASE 28   No results for input(s): AMMONIA in the last 168 hours. CBC:  Recent Labs Lab 11/03/14 1856 11/07/14 2154 11/08/14 1348 11/08/14 1935 11/09/14 0010 11/09/14 0708  WBC 6.1 6.8 7.1 5.5 7.3 5.5  NEUTROABS 3.3 3.0  --   --   --   --   HGB 13.7 14.8 14.7 13.4 13.6 12.8  HCT 40.2 44.3 43.9 40.8 40.0 38.8  MCV 99.3 98.9 98.7 100.0 101.3* 100.0  PLT 216 213 231 206 184 190   Cardiac Enzymes:  Recent Labs Lab 11/08/14 1935 11/09/14 0010 11/09/14 0708  TROPONINI <0.30 <0.30 <0.30   BNP: BNP (last 3 results)  Recent Labs  06/15/14 1610 11/08/14 1348  PROBNP 306.1* 219.1*   CBG: No results for input(s): GLUCAP in the last 168 hours.     SignedLouellen Molder  [facility] 11/09/2014, 2:33 PM

## 2014-11-09 NOTE — Plan of Care (Signed)
Problem: Phase I Progression Outcomes Goal: Pain controlled with appropriate interventions Outcome: Progressing     

## 2014-11-09 NOTE — Consult Note (Signed)
[facility] Consult: 8:38 AM 11/09/2014  LOS: 1 day    Referring Provider: Dr Clementeen Graham  Primary Care Physician:   Melinda Crutch, MD Primary Gastroenterologist:  Dr. Deatra Ina.  Dr Brandon Melnick at [facility]    Reason for Consultation:  Hematemesis.     HPI: Theresa [name] is a 68 y.o. female.  Hx right breast cancer, lumpectomy 2002 and radiotherapy, Arimidex finished 2012.  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 Hx anxiety/depression.  Chronic n/v and hx esophageal dilatations.  Take Omeprazole 20 mg daily.  Med list also included Protonix 40 mg daily but she does not think she is using this.   Unable to tolerated swallowing the egg for a GES. S/p cholecystectomy.  Last GI OV was 09/11/2014 for ongoing n/v, hiccups.  Remeron helped in past but pt afraid to take both Celexa and Remeron.  Dr Deatra Ina decided on short trial of Reglan but pt does not recall ever taking this.  Her n/v is primarily in the AM.  Takes zofran in AM and stomach settles down.  Until last few days, has not had hematemesis. BMs occasionally loose and she treats with prn Imodium.  No recent BPR other than 3 months ago after a boating trip where she took several Imodiums and had very hard, difficult to evacuate stool.   Had some minor hematemesis on Friday 11/27, diagnosed with CAP at ED, RXd with Z pack.  12/2 started having palpitations with self checked rate > 160. Returned to ED by which time tachycardia had spontaneously resolved. Sent home.  Became dizzy, weak.  Recurrent minor hematemesis vomiting streaks of blood.  Kept appt 12/2 for PAD vascular studies.  Had palpitations and hyperventilating at clinic. SOB and chest pain ensued. Admitted. Cardiac enzymes normal.  AST in 50s and fatty liver evident on CT and ultrasound.    ENDOSCOPIC STUDIES: EGD  07/2013 by Dr Brandon Melnick Esophageal stricture was dilated and 2cm HH.  Irregular bulbar mucosa, Path: focal foveolar metaplasia. Note is made that patient suffered damage to her front lower incisors at the time of this study. This has led to her needing a a dental appliance to replace teeth which had to be removed.   08/2011: Colonoscopy, Dr Deatra Ina For diarrhea and hx microscopic colitis Grossly normal.  Rx with empiric entocort  Pathology: Plasma cell infiltrate, superficial neutrophilic crypt/lamina propria infiltration. Features highly suggestive of infectious colitis.   07/2011  EGD Balloon dilatation of distal esophageal stricture, HH.  12/2010  EGD Balloon dilatation of distal esophageal stricture.   09/2010  EGD ENDOSCOPIC IMPRESSION:  1) Erosions, multiple in the antrum  2) Stricture at the gastroesophageal junction  3) Otherwise normal examination  RECOMMENDATIONS:  1) continue PPI  2) endoscopy with dilitation 3-4 weeks; hold plavix for 7 days  prior to procedure  3) resume plavix in 7 days  04/2010, 03/2009, 09/2005 EGD  Balloon dilatation of distal esophageal stricture, HH.  03/2009  Flex sig: for diarrhea.  Diffuse moderateerythema c/w colitis Pathology:  Sigmoid colitis. There are a few areas of minimal active neutrophilic  inflammation predominantly involving the superficial epithelium. The underlying lamina propria shows mildly increased infiltrate and there are increased intraepithelial lymphocytes within the crypt epithelium and the overlying superficial epithelium. findings are usually associated with lymphocytic colitis; however, endoscopic abnormalities and active inflammation are not usually features of lymphocytic colitis. No granulomas or significant chronic changes are identified. Also, the active inflammation in this case is mild, focal and predominantly superficial which can be indicative of bowel prep  related changes.  09/2005 Colonoscopy For diarrhea.  Normal Study.    Past Medical History  Diagnosis Date  . Stricture and stenosis of esophagus   . Other and unspecified hyperlipidemia   . Dysthymic disorder   . Other and unspecified coagulation defects   . Diaphragmatic hernia without mention of obstruction or gangrene   . PAD (peripheral artery disease)   . PVD (peripheral vascular disease)   . Pulmonary embolism 06/2014    "both lungs"  . DVT (deep venous thrombosis) 06/2014    RLE  . GERD (gastroesophageal reflux disease)   . History of hiatal hernia   . Anxiety   . Malignant neoplasm of breast (female), unspecified site 2002    "right"  . SVT (supraventricular tachycardia) 11/07/2014  . On home oxygen therapy     "2L at night and prn" (11/08/2014)    Past Surgical History  Procedure Laterality Date  . Appendectomy  12/1977  . Cholecystectomy  12/1977  . Breast lumpectomy Right 01/2001  . Tubal ligation  1980's  . Cardiac catheterization  1990's  . Esophagogastroduodenoscopy (egd) with esophageal dilation  X 7    Prior to Admission medications   Medication Sig Start Date End Date Taking? Authorizing Provider  ALPRAZolam (XANAX) 0.25 MG tablet Take 0.25 mg by mouth daily.    Yes Historical Provider, MD  clobetasol ointment (TEMOVATE) 8.93 % Apply 1 application topically daily as needed (groin itch).  10/09/14  Yes Historical Provider, MD  desvenlafaxine (PRISTIQ) 50 MG 24 hr tablet Take 25 mg by mouth daily.   Yes Historical Provider, MD  omeprazole (PRILOSEC) 20 MG capsule Take 20 mg by mouth daily.    Yes Historical Provider, MD  ondansetron (ZOFRAN) 4 MG tablet Take 1 tablet (4 mg total) by mouth every 8 (eight) hours as needed for nausea or vomiting. 10/03/14  Yes Inda Castle, MD  pantoprazole (PROTONIX) 40 MG tablet Take 40 mg by mouth daily.  10/16/14  Yes Historical Provider, MD  promethazine (PHENERGAN) 25 MG tablet Take 25 mg by mouth every 6 (six) hours as  needed for nausea.    Yes Historical Provider, MD  rivaroxaban (XARELTO) 20 MG TABS tablet Take 20 mg by mouth daily with supper.   Yes Historical Provider, MD    Scheduled Meds: . desvenlafaxine  50 mg Oral Daily  . folic acid  1 mg Oral Daily  . LORazepam  0-4 mg Oral Q6H   Followed by  . [START ON 11/10/2014] LORazepam  0-4 mg Oral Q12H  . multivitamin with minerals  1 tablet Oral Daily  . pantoprazole (PROTONIX) IV  40 mg Intravenous Q12H  . sodium chloride  3 mL Intravenous Q12H  . thiamine  100 mg Oral Daily   Or  . thiamine  100 mg Intravenous Daily   Infusions:   PRN Meds: acetaminophen **OR** acetaminophen, albuterol, LORazepam **OR** LORazepam, ondansetron **OR** ondansetron (ZOFRAN) IV   Allergies as of 11/08/2014 - Review Complete 11/08/2014  Allergen Reaction Noted  . Ciprofloxacin Hives 06/19/2011  Family History  Problem Relation Age of Onset  . Heart disease Father   . Heart attack Father   . Colon cancer Neg Hx   . Lung cancer Maternal Grandfather   . Deep vein thrombosis Daughter     History   Social History  . Marital Status: Married    Spouse Name: N/A    Number of Children: 2  . Years of Education: N/A   Occupational History  . Unemployed    Social History Main Topics  . Smoking status: Former Smoker -- 1.50 packs/day for 38 years    Types: Cigarettes    Quit date: 12/08/1998  . Smokeless tobacco: Never Used  . Alcohol Use: 27.6 oz/week    32 Shots of liquor, 14 Not specified per week     Comment: 11/08/2014 "2-3, 1 1/2shot drinks/night"  . Drug Use: No  . Sexual Activity: Yes    Birth Control/ Protection: None   Other Topics Concern  . Not on file   Social History Narrative    REVIEW OF SYSTEMS: Constitutional:  Patient is generally less active since her diagnosis of DVT/PE this summer. She thinks she's gained 20 pounds within the last year. ENT:  +nose bleed per history of present illness. This was the first nosebleed she's  had in many years Pulm:  Uses nasal cannula oxygen at night and PRN during the day.  See history of present illness.  CV:  See history of present illness. no LE edema.  GU:  No hematuria, no frequency GI:  Stable dysphagia, mostly to larger pills which she crushes to swallow. Heme:  No history of needing iron supplementation   Transfusions:  None per her recall Neuro:  No headaches, no peripheral tingling or numbness Derm:  No itching, no rash or sores.  Endocrine:  No sweats or chills.  No polyuria or dysuria Immunization:  Not reviewed Travel: Has traveled to [place] within the last few months   PHYSICAL EXAM: Vital signs in last 24 hours: Filed Vitals:   11/09/14 0418  BP: 104/65  Pulse: 95  Temp: 98 F (36.7 C)  Resp: 18   Wt Readings from Last 3 Encounters:  11/09/14 136 lb 12.8 oz (62.052 kg)  11/03/14 165 lb (74.844 kg)  10/25/14 168 lb 4.8 oz (76.34 kg)    General: Pleasant, comfortable, non-ill appearing WF. Head:  No asymmetry, no facial edema, no indications of trauma  Eyes:  No icterus, no pallor. EOMI Ears:  Hearing intact without obvious deficits  Nose:  No congestion, no discharge and no dried blood. Mouth:  Dentition in good repair. Mucosa is moist and clear. Neck:  JVD, no thyromegaly, no masses or bruits Lungs:  Clear to auscultation and percussion bilaterally. No cough or dyspnea. Vocal quality is hoarse Heart: RRR. No MRG. S1/S2 audible Abdomen:  Soft, no masses. Bowel sounds active. No organomegaly or bruits. No tenderness.   Rectal: Deferred   Musc/Skeltl: No joint contractures, erythema or swelling.  Limb strength full bilaterally in upper and lower extremities Extremities:  No CCE. Feet are warm.  Neurologic:  Oriented 3, alert. Good historian. No tremor. Skin:  No telangiectasia, no sores or rash Tattoos:  None Nodes:  No cervical adenopathy   Psych:  Relaxed, pleasant, not anxious.  Intake/Output from previous day: 12/02 0701 - 12/03  0700 In: 120 [P.O.:120] Out: -  Intake/Output this shift: Total I/O In: 523.8 [I.V.:523.8] Out: -   LAB RESULTS:  Recent Labs  11/08/14 1935  11/09/14 0010 11/09/14 0708  WBC 5.5 7.3 5.5  HGB 13.4 13.6 12.8  HCT 40.8 40.0 38.8  PLT 206 184 190   BMET Lab Results  Component Value Date   NA 141 11/09/2014   NA 143 11/08/2014   NA 140 11/07/2014   K 4.0 11/09/2014   K 4.2 11/08/2014   K 3.6* 11/07/2014   CL 102 11/09/2014   CL 100 11/08/2014   CL 96 11/07/2014   CO2 26 11/09/2014   CO2 25 11/08/2014   CO2 25 11/07/2014   GLUCOSE 88 11/09/2014   GLUCOSE 109* 11/08/2014   GLUCOSE 76 11/07/2014   BUN 10 11/09/2014   BUN 8 11/08/2014   BUN 7 11/07/2014   CREATININE 0.78 11/09/2014   CREATININE 0.99 11/08/2014   CREATININE 1.07 11/07/2014   CALCIUM 7.5* 11/09/2014   CALCIUM 8.4 11/08/2014   CALCIUM 8.3* 11/07/2014   LFT  Recent Labs  11/08/14 1935 11/09/14 0708  PROT 6.3 6.0  ALBUMIN 2.8* 2.6*  AST 55* 53*  ALT 39* 34  ALKPHOS 90 84  BILITOT 1.2 1.6*  BILIDIR 0.5*  --   IBILI 0.7  --    PT/INR Lab Results  Component Value Date   INR 1.17 11/09/2014   INR 1.59* 11/07/2014   INR 1.11 02/23/2010   Hepatitis Panel No results for input(s): HEPBSAG, HCVAB, HEPAIGM, HEPBIGM in the last 72 hours. C-Diff No components found for: CDIFF Lipase     Component Value Date/Time   LIPASE 28 11/03/2014 1856    Drugs of Abuse  No results found for: LABOPIA, COCAINSCRNUR, LABBENZ, AMPHETMU, THCU, LABBARB   RADIOLOGY STUDIES: Dg Chest 2 View  11/07/2014   CLINICAL DATA:  68 year old female with shortness of breath. Initial encounter.  EXAM: CHEST  2 VIEW  COMPARISON:  11/03/2014 and prior chest radiographs  FINDINGS: The cardiomediastinal silhouette is unremarkable.  There is no evidence of focal airspace disease, pulmonary edema, suspicious pulmonary nodule/mass, pleural effusion, or pneumothorax. No acute bony abnormalities are identified.  Defibrillator pads  overlying the chest are noted.  IMPRESSION: No active cardiopulmonary disease.   Electronically Signed   By: Hassan Rowan M.D.   On: 11/07/2014 23:21   Ct Angio Chest Pe W/cm &/or Wo Cm  11/08/2014   CLINICAL DATA:  Shortness of breath with hemoptysis. History of DVT and pulmonary embolism. Initial encounter.  EXAM: CT ANGIOGRAPHY CHEST WITH CONTRAST  TECHNIQUE: Multidetector CT imaging of the chest was performed using the standard protocol during bolus administration of intravenous contrast. Multiplanar CT image reconstructions and MIPs were obtained to evaluate the vascular anatomy.  CONTRAST:  12mL OMNIPAQUE IOHEXOL 350 MG/ML SOLN  COMPARISON:  Chest CTA 06/15/2014.  FINDINGS: Vascular: The pulmonary arteries are well opacified with contrast. There is minimal residual intimal irregularity at sites of the previously demonstrated pulmonary embolism bilaterally. No recurrent acute pulmonary emboli are demonstrated. The central pulmonary arteries are patent. There are no signs of acute right ventricular strain. Moderate atherosclerosis of the aorta, great vessels and coronary arteries is again noted. The heart size is normal. There is no pericardial effusion.  Mediastinum: Small mediastinal and hilar lymph nodes are stable. The esophagus and trachea demonstrate no significant findings. There is a stable small hiatal hernia.  Lungs/Pleura: There is no pleural effusion. Emphysema and scattered peripheral fibrotic changes in both lungs are stable. There is no confluent airspace opacity or suspicious pulmonary nodule.  Upper abdomen: Severe hepatic steatosis appears progressive. The liver density is -6 HU.  No focal lesions are apparent. The gallbladder surgically absent. Small lymph nodes within the porta hepatis appears stable. There is no evidence of adrenal mass.  Musculoskeletal/Chest wall: No chest wall lesion or acute osseous findings.  Review of the MIP images confirms the above findings.  IMPRESSION: 1. Sequela of  prior thromboembolic disease with mild pulmonary arterial intimal irregularity bilaterally. No evidence of recurrent acute pulmonary embolism. 2. Stable diffuse atherosclerosis. 3. No acute findings identified. 4. Progressive severe hepatic steatosis.   Electronically Signed   By: Camie Patience M.D.   On: 11/08/2014 16:51   US Abdomen Complete  11/09/2014   CLINICAL DATA:  Elevated liver function tests.  EXAM: ULTRASOUND ABDOMEN COMPLETE  COMPARISON:  CT scan of the abdomen dated 04/21/2013 and abdominal ultrasound dated 03/29/2013  FINDINGS: Gallbladder: Removed  Common bile duct: Diameter: 10.6 mm, unchanged since the prior CT scan. No dilated intrahepatic bile ducts.  Liver: No focal lesions. Diffuse increased echogenicity of the liver parenchyma consistent with hepatic steatosis, as demonstrated on the prior CT scan.  IVC: No abnormality visualized.  Pancreas: Visualized portion unremarkable.  Spleen: 3.9 cm, normal.  Right Kidney: Length: 10.1 cm. Echogenicity within normal limits. No mass or hydronephrosis visualized.  Left Kidney: Length: 10.2 cm. Echogenicity within normal limits. No mass or hydronephrosis visualized.  Abdominal aorta: No aneurysm visualized.  2.3 cm maximum diameter.  Other findings: None.  IMPRESSION: Hepatic steatosis. Otherwise normal exam. Gallbladder has been removed.   Electronically Signed   By: Rozetta Nunnery M.D.   On: 11/09/2014 08:33      IMPRESSION:   *  Minor hematemesis in pt with long-standing chronic n/v and hx recurrent esophageal strictures (last was 07/2013) as well as HH.  suspect retching associated trauma or minor MWT  *  Palpitations and dizziness.    *  Minor elevation of AST.  Dates back to 02/2014. Fatty liver on ultrasound. Progressive severe hepatic steatosis on CT.  Drinks 2 to 3 [place] daily. Admitting physician was concerned about possible alcohol withdrawal so she is on CIWA protocol.   Remote cholecystectomy.   *  Macrocytosis.   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  On Xarelto. No acute PE on CT angio 12/2  *  Epistaxis.      PLAN:     *  ? EGD.  Decision per Dr Fuller Plan. If it is done it's likely to be done tomorrow but need to verify this with Dr. If no EGD planned for today we can advance her diet to full liquids. *  2-D echocardiogram is scheduled for today. *  Patient does not require every 6 hour CBCs. We will recheck CBC in the morning. *  ? Increase her Omeprazole to 40 mg daily at home? For now and going to switch her on to oral Protonix, twice daily    Azucena Freed  11/09/2014, 8:38 AM Pager: [phone]      Attending physician's note   I have taken a history, examined the patient and reviewed the chart. I agree with the Advanced Practitioner's note, impression and recommendations. Scant hematemesis with N/V. Suspect she is having worsening reflux symptoms and then had a mild musocal abrasion/MW tear from vomiting. No plans to repeat her EGD at this time. Reconsider if symptoms persist or worsen. Increase PPI to bid. Advance diet. Outpatient GI follow up with Dr. Deatra Ina and Dr. Brandon Melnick. GI signing off.   Ladene Artist, MD  FACG

## 2014-11-20 ENCOUNTER — Encounter (HOSPITAL_COMMUNITY): Payer: Self-pay | Admitting: Emergency Medicine

## 2014-11-20 ENCOUNTER — Telehealth: Payer: Self-pay | Admitting: Gastroenterology

## 2014-11-20 ENCOUNTER — Emergency Department (HOSPITAL_COMMUNITY): Payer: Medicare Other

## 2014-11-20 ENCOUNTER — Emergency Department (HOSPITAL_COMMUNITY)
Admission: EM | Admit: 2014-11-20 | Discharge: 2014-11-20 | Disposition: A | Discharge status: Home / Self Care | Payer: Medicare Other | Attending: Emergency Medicine | Admitting: Emergency Medicine

## 2014-11-20 DIAGNOSIS — Z9851 Tubal ligation status: Secondary | ICD-10-CM | POA: Insufficient documentation

## 2014-11-20 DIAGNOSIS — Z79899 Other long term (current) drug therapy: Secondary | ICD-10-CM | POA: Insufficient documentation

## 2014-11-20 DIAGNOSIS — Z853 Personal history of malignant neoplasm of breast: Secondary | ICD-10-CM | POA: Insufficient documentation

## 2014-11-20 DIAGNOSIS — Z86718 Personal history of other venous thrombosis and embolism: Secondary | ICD-10-CM | POA: Diagnosis not present

## 2014-11-20 DIAGNOSIS — F419 Anxiety disorder, unspecified: Secondary | ICD-10-CM | POA: Insufficient documentation

## 2014-11-20 DIAGNOSIS — Z9981 Dependence on supplemental oxygen: Secondary | ICD-10-CM | POA: Diagnosis not present

## 2014-11-20 DIAGNOSIS — Z86711 Personal history of pulmonary embolism: Secondary | ICD-10-CM | POA: Insufficient documentation

## 2014-11-20 DIAGNOSIS — R5383 Other fatigue: Secondary | ICD-10-CM | POA: Insufficient documentation

## 2014-11-20 DIAGNOSIS — R110 Nausea: Secondary | ICD-10-CM | POA: Insufficient documentation

## 2014-11-20 DIAGNOSIS — R1013 Epigastric pain: Secondary | ICD-10-CM | POA: Insufficient documentation

## 2014-11-20 DIAGNOSIS — K219 Gastro-esophageal reflux disease without esophagitis: Secondary | ICD-10-CM | POA: Diagnosis not present

## 2014-11-20 DIAGNOSIS — Z9049 Acquired absence of other specified parts of digestive tract: Secondary | ICD-10-CM | POA: Diagnosis not present

## 2014-11-20 DIAGNOSIS — Z9089 Acquired absence of other organs: Secondary | ICD-10-CM | POA: Diagnosis not present

## 2014-11-20 DIAGNOSIS — R1012 Left upper quadrant pain: Secondary | ICD-10-CM | POA: Insufficient documentation

## 2014-11-20 DIAGNOSIS — R630 Anorexia: Secondary | ICD-10-CM | POA: Diagnosis not present

## 2014-11-20 DIAGNOSIS — Z862 Personal history of diseases of the blood and blood-forming organs and certain disorders involving the immune mechanism: Secondary | ICD-10-CM | POA: Diagnosis not present

## 2014-11-20 DIAGNOSIS — Z8639 Personal history of other endocrine, nutritional and metabolic disease: Secondary | ICD-10-CM | POA: Insufficient documentation

## 2014-11-20 DIAGNOSIS — Z87891 Personal history of nicotine dependence: Secondary | ICD-10-CM | POA: Insufficient documentation

## 2014-11-20 DIAGNOSIS — Z9889 Other specified postprocedural states: Secondary | ICD-10-CM | POA: Insufficient documentation

## 2014-11-20 LAB — CBC WITH DIFFERENTIAL/PLATELET
Basophils Absolute: 0 10*3/uL (ref 0.0–0.1)
Basophils Relative: 0 % (ref 0–1)
EOS ABS: 0.1 10*3/uL (ref 0.0–0.7)
EOS PCT: 1 % (ref 0–5)
HCT: 45 % (ref 36.0–46.0)
HEMOGLOBIN: 15.6 g/dL — AB (ref 12.0–15.0)
LYMPHS ABS: 2 10*3/uL (ref 0.7–4.0)
Lymphocytes Relative: 21 % (ref 12–46)
MCH: 35.1 pg — ABNORMAL HIGH (ref 26.0–34.0)
MCHC: 34.7 g/dL (ref 30.0–36.0)
MCV: 101.4 fL — AB (ref 78.0–100.0)
MONOS PCT: 9 % (ref 3–12)
Monocytes Absolute: 0.8 10*3/uL (ref 0.1–1.0)
Neutro Abs: 6.4 10*3/uL (ref 1.7–7.7)
Neutrophils Relative %: 69 % (ref 43–77)
PLATELETS: 272 10*3/uL (ref 150–400)
RBC: 4.44 MIL/uL (ref 3.87–5.11)
RDW: 15.1 % (ref 11.5–15.5)
WBC: 9.3 10*3/uL (ref 4.0–10.5)

## 2014-11-20 LAB — I-STAT CHEM 8, ED
BUN: 8 mg/dL (ref 6–23)
CREATININE: 1.1 mg/dL (ref 0.50–1.10)
Calcium, Ion: 0.94 mmol/L — ABNORMAL LOW (ref 1.13–1.30)
Chloride: 102 mEq/L (ref 96–112)
GLUCOSE: 134 mg/dL — AB (ref 70–99)
HCT: 52 % — ABNORMAL HIGH (ref 36.0–46.0)
HEMOGLOBIN: 17.7 g/dL — AB (ref 12.0–15.0)
Potassium: 3.2 mEq/L — ABNORMAL LOW (ref 3.7–5.3)
Sodium: 139 mEq/L (ref 137–147)
TCO2: 20 mmol/L (ref 0–100)

## 2014-11-20 LAB — APTT: aPTT: 40 seconds — ABNORMAL HIGH (ref 24–37)

## 2014-11-20 LAB — PROTIME-INR
INR: 2.59 — ABNORMAL HIGH (ref 0.00–1.49)
Prothrombin Time: 27.9 seconds — ABNORMAL HIGH (ref 11.6–15.2)

## 2014-11-20 MED ORDER — PROMETHAZINE HCL 25 MG RE SUPP: 25.0000 mg | Freq: Four times a day (QID) | RECTAL | Status: DC | PRN

## 2014-11-20 MED ORDER — IOHEXOL 300 MG/ML  SOLN
100.0000 mL | Freq: Once | INTRAMUSCULAR | Status: AC | PRN
  Administered 2014-11-20: 100 mL via INTRAVENOUS

## 2014-11-20 MED ORDER — IOHEXOL 300 MG/ML  SOLN
25.0000 mL | INTRAMUSCULAR | Status: AC
  Administered 2014-11-20: 25 mL via ORAL

## 2014-11-20 MED ORDER — ONDANSETRON HCL 4 MG/2ML IJ SOLN
4.0000 mg | Freq: Once | INTRAMUSCULAR | Status: AC
  Administered 2014-11-20: 4 mg via INTRAVENOUS
  Filled 2014-11-20: qty 2

## 2014-11-20 NOTE — ED Notes (Signed)
Pt returned from CT °

## 2014-11-20 NOTE — ED Notes (Signed)
Patient finished with contrast, CT notified.

## 2014-11-20 NOTE — ED Notes (Signed)
Per dr. Tawnya Crook, pt okay to be given crackers and ginger ale.

## 2014-11-20 NOTE — Telephone Encounter (Signed)
Theresa [name] calls with c/o nausea and bloating. States she was in the hospital recently and had an abdominal ultrasound. She has seen Dr Harrington Challenger for her hospital follow up. Reports she told him of these symptoms. Labs from that visit are pending. She states she has had these problems for a long time. Theresa [name] will call back if Dr Harrington Challenger recommends she return here.

## 2014-11-20 NOTE — ED Provider Notes (Signed)
CSN: [id number]     Arrival date & time 11/20/14  1610 History   First MD Initiated Contact with Patient 11/20/14 1740     Chief Complaint  Patient presents with  . Abdominal Pain     (Consider location/radiation/quality/duration/timing/severity/associated sxs/prior Treatment) Patient is a 68 y.o. female presenting with abdominal pain. The history is provided by the patient. No language interpreter was used.  Abdominal Pain Pain location:  Epigastric Pain quality comment:  Discomfort, bloating, nausea Pain radiates to:  Does not radiate Pain severity:  Mild Duration: 6 months. Timing:  Intermittent Progression:  Waxing and waning Chronicity:  Chronic Relieved by:  Nothing Exacerbated by: eating. Ineffective treatments:  None tried Associated symptoms: anorexia, fatigue and nausea   Associated symptoms: no chest pain, no chills, no constipation, no cough, no diarrhea, no dysuria, no fever, no shortness of breath, no sore throat and no vomiting     Past Medical History  Diagnosis Date  . Stricture and stenosis of esophagus   . Other and unspecified hyperlipidemia   . Dysthymic disorder   . Other and unspecified coagulation defects   . Diaphragmatic hernia without mention of obstruction or gangrene   . PAD (peripheral artery disease)   . Pulmonary embolism 06/2014    "both lungs"  . DVT (deep venous thrombosis) 06/2014    RLE  . GERD (gastroesophageal reflux disease)   . History of hiatal hernia   . Anxiety   . Malignant neoplasm of breast (female), unspecified site 2002    "right"  . SVT (supraventricular tachycardia) 11/07/2014  . On home oxygen therapy     "2L at night and prn" (11/08/2014)   Past Surgical History  Procedure Laterality Date  . Appendectomy  12/1977  . Cholecystectomy  12/1977  . Breast lumpectomy Right 01/2001  . Tubal ligation  1980's  . Cardiac catheterization  1990's  . Esophagogastroduodenoscopy (egd) with esophageal dilation  X 7   Family  History  Problem Relation Age of Onset  . Heart disease Father   . Heart attack Father   . Colon cancer Neg Hx   . Lung cancer Maternal Grandfather   . Deep vein thrombosis Daughter    History  Substance Use Topics  . Smoking status: Former Smoker -- 1.50 packs/day for 38 years    Types: Cigarettes    Quit date: 12/08/1998  . Smokeless tobacco: Never Used  . Alcohol Use: 27.6 oz/week    32 Shots of liquor, 14 Not specified per week     Comment: 11/08/2014 "2-3, 1 1/2shot drinks/night"   OB History    No data available     Review of Systems  Constitutional: Positive for appetite change and fatigue. Negative for fever, chills, diaphoresis and activity change.  HENT: Negative for congestion, facial swelling, rhinorrhea and sore throat.   Eyes: Negative for photophobia and discharge.  Respiratory: Negative for cough, chest tightness and shortness of breath.   Cardiovascular: Negative for chest pain, palpitations and leg swelling.  Gastrointestinal: Positive for nausea, abdominal pain, abdominal distention and anorexia. Negative for vomiting, diarrhea and constipation.  Endocrine: Negative for polydipsia and polyuria.  Genitourinary: Negative for dysuria, frequency, difficulty urinating and pelvic pain.  Musculoskeletal: Negative for back pain, arthralgias, neck pain and neck stiffness.  Skin: Negative for color change and wound.  Allergic/Immunologic: Negative for immunocompromised state.  Neurological: Negative for facial asymmetry, weakness, numbness and headaches.  Hematological: Does not bruise/bleed easily.  Psychiatric/Behavioral: Negative for confusion and agitation.  Allergies  Ciprofloxacin  Home Medications   Prior to Admission medications   Medication Sig Start Date End Date Taking? Authorizing Provider  ALPRAZolam (XANAX) 0.25 MG tablet Take 0.25 mg by mouth daily.    Yes Historical Provider, MD  Desvenlafaxine Succinate ER (PRISTIQ) 25 MG TB24 Take 1  tablet by mouth daily.   Yes Historical Provider, MD  ondansetron (ZOFRAN) 4 MG tablet Take 1 tablet (4 mg total) by mouth every 8 (eight) hours as needed for nausea or vomiting. 10/03/14  Yes Inda Castle, MD  pantoprazole (PROTONIX) 40 MG tablet Take 1 tablet (40 mg total) by mouth daily. 11/09/14  Yes Nishant Dhungel, MD  rivaroxaban (XARELTO) 20 MG TABS tablet Take 20 mg by mouth daily with supper.   Yes Historical Provider, MD  promethazine (PHENERGAN) 25 MG suppository Place 1 suppository (25 mg total) rectally every 6 (six) hours as needed for nausea or vomiting. 11/20/14   Ernestina Patches, MD   BP 133/85 mmHg  Pulse 95  Temp(Src) 98.7 F (37.1 C) (Oral)  Resp 18  Ht 5\' 2"  (1.575 m)  SpO2 96% Physical Exam  Constitutional: She is oriented to person, place, and time. She appears well-developed and well-nourished. No distress.  HENT:  Head: Normocephalic and atraumatic.  Mouth/Throat: No oropharyngeal exudate.  Eyes: Pupils are equal, round, and reactive to light.  Neck: Normal range of motion. Neck supple.  Cardiovascular: Normal rate, regular rhythm and normal heart sounds.  Exam reveals no gallop and no friction rub.   No murmur heard. Pulmonary/Chest: Effort normal and breath sounds normal. No respiratory distress. She has no wheezes. She has no rales.  Abdominal: Soft. Bowel sounds are normal. She exhibits no distension and no mass. There is tenderness in the epigastric area and left upper quadrant. There is no rigidity, no rebound and no guarding.  Musculoskeletal: Normal range of motion. She exhibits no edema or tenderness.  Neurological: She is alert and oriented to person, place, and time.  Skin: Skin is warm and dry.  Psychiatric: She has a normal mood and affect.    ED Course  Procedures (including critical care time) Labs Review Labs Reviewed  CBC WITH DIFFERENTIAL - Abnormal; Notable for the following:    Hemoglobin 15.6 (*)    MCV 101.4 (*)    MCH 35.1 (*)     All other components within normal limits  APTT - Abnormal; Notable for the following:    aPTT 40 (*)    All other components within normal limits  PROTIME-INR - Abnormal; Notable for the following:    Prothrombin Time 27.9 (*)    INR 2.59 (*)    All other components within normal limits  I-STAT CHEM 8, ED - Abnormal; Notable for the following:    Potassium 3.2 (*)    Glucose, Bld 134 (*)    Calcium, Ion 0.94 (*)    Hemoglobin 17.7 (*)    HCT 52.0 (*)    All other components within normal limits    Imaging Review Ct Abdomen Pelvis W Contrast  11/20/2014   CLINICAL DATA:  Upper abdominal pain  EXAM: CT ABDOMEN AND PELVIS WITH CONTRAST  TECHNIQUE: Multidetector CT imaging of the abdomen and pelvis was performed using the standard protocol following bolus administration of intravenous contrast.  CONTRAST:  184mL OMNIPAQUE IOHEXOL 300 MG/ML  SOLN  COMPARISON:  04/21/2013  FINDINGS: Scarring in the right middle lobe and lingula.  Moderate-sized hiatal hernia.  Aortic valve calcifications.  Coronary artery  calcifications  Severe diffuse hepatic steatosis.  Postcholecystectomy  Spleen, pancreas, adrenal glands, and kidneys are within normal limits.  No disproportionate dilatation of bowel.  No free-fluid or abnormal retroperitoneal adenopathy  Uterus and adnexa are within normal limits.  Atherosclerotic calcifications of the aorta and visceral vasculature are noted.  Severe degenerative changes throughout the lumbar spine. No vertebral compression deformity.  IMPRESSION: Hiatal hernia.  Diffuse hepatic steatosis.   Electronically Signed   By: Maryclare Bean M.D.   On: 11/20/2014 20:33     EKG Interpretation   Date/Time:  Monday November 20 2014 16:18:24 EST Ventricular Rate:  143 PR Interval:  124 QRS Duration: 78 QT Interval:  286 QTC Calculation: 441 R Axis:   62 Text Interpretation:  Sinus tachycardia Possible Anterior infarct , age  undetermined Abnormal ECG Confirmed by Lisa-Marie Rueger  MD, Doyt Castellana  [phone]) on  11/20/2014 7:01:51 PM      MDM   Final diagnoses:  Epigastric abdominal pain  Nausea   Pt is a 68 y.o. female with Pmhx as above who presents with about 6 months of epigastric discomfort, distention and bloating, frequent episodes of nausea as well as elevated heart rate.  She is scheduled to see a cardiologist in January and will be reffered to an endocrimologist.  She was sent to the ED by her PCP, Dr. Harrington Challenger because per patient, "I be will be able to see specialists faster that way".  She reports that Dr. Harrington Challenger drew labs last week and that she had an elevated catecholamine level.  Her thyroid function tests were reportedly normal.  She denies having fever, chills, shortness of breath.  She has not had episodic headaches or diaphoresis.  She has had a weight gain of 20 pounds over the past 6 months despite having a decreased appetite.  She is a history of chronic diarrhea, although over the past 1-2 months.  This has resolved and she is now having firm hard stools.  Physical exam, she has mild Sinus tachycardia.  She is tenderness palpation in her epigastrium and left upper quadrant without rebound or guarding. Given wt gain, change in bowel habits, will get CT ab/pelvis.   CT abnormal only for hiatal hernia. No adrenal or renal masses. I do not feel she requires further ED w/u and does not need emergent GI or endocrinology consult, and can have these as an outpt. Will trial phenergan supp as alternative to zofran.    Doneen Poisson evaluation in the Emergency Department is complete. It has been determined that no acute conditions requiring further emergency intervention are present at this time. The patient/guardian have been advised of the diagnosis and plan. We have discussed signs and symptoms that warrant return to the ED, such as changes or worsening in symptoms, worsening pain, fever, inability to tolerate PO.    Ernestina Patches, MD 11/21/14 [phone]

## 2014-11-20 NOTE — ED Notes (Signed)
Pt c/o upper abd pain with nausea.  No vomiting.  Denies diarrhea.  Pt was sent to ED by Dr. Harrington Challenger.

## 2014-11-20 NOTE — ED Notes (Signed)
Discharge instructions and prescription reviewed 

## 2014-11-20 NOTE — Discharge Instructions (Signed)

## 2014-11-22 ENCOUNTER — Telehealth: Payer: Self-pay | Admitting: Gastroenterology

## 2014-11-23 NOTE — Telephone Encounter (Signed)
Spoke with the patient. No new symptoms. No sooner appointments, but will monitor the schedule for a cancellation.

## 2014-12-05 ENCOUNTER — Telehealth: Payer: Self-pay | Admitting: *Deleted

## 2014-12-05 NOTE — Telephone Encounter (Signed)
called and LVM to request records.Marland KitchenMarland Kitchen

## 2014-12-06 ENCOUNTER — Encounter: Payer: Medicare Other | Admitting: Cardiovascular Disease

## 2014-12-06 NOTE — Telephone Encounter (Signed)
Spoke with Theresa [name] and requested last ov, ekg anything we needed for the referral..[name]

## 2014-12-06 NOTE — Telephone Encounter (Signed)
Received records

## 2014-12-11 ENCOUNTER — Other Ambulatory Visit: Payer: Self-pay | Admitting: *Deleted

## 2014-12-11 ENCOUNTER — Encounter: Payer: Self-pay | Admitting: Cardiology

## 2014-12-11 ENCOUNTER — Ambulatory Visit (INDEPENDENT_AMBULATORY_CARE_PROVIDER_SITE_OTHER): Payer: Medicare Other | Admitting: Cardiology

## 2014-12-11 VITALS — BP 80/62 | HR 119 | Ht 62.0 in | Wt 159.0 lb

## 2014-12-11 DIAGNOSIS — I70219 Atherosclerosis of native arteries of extremities with intermittent claudication, unspecified extremity: Secondary | ICD-10-CM

## 2014-12-11 DIAGNOSIS — R0602 Shortness of breath: Secondary | ICD-10-CM

## 2014-12-11 DIAGNOSIS — R002 Palpitations: Secondary | ICD-10-CM

## 2014-12-11 MED ORDER — ONDANSETRON HCL 4 MG PO TABS: 4.0000 mg | ORAL_TABLET | Freq: Three times a day (TID) | ORAL | Status: DC | PRN

## 2014-12-11 NOTE — Progress Notes (Signed)
Patient ID: HILLARY STRUSS, female   DOB: 1946-10-19, 69 y.o.   MRN: [id number]     Patient Name: Theresa [name] Date of Encounter: 12/11/2014  Primary Care Provider:   Melinda Crutch, MD Primary Cardiologist:  Dorothy Spark   Problem List   Past Medical History  Diagnosis Date  . Stricture and stenosis of esophagus   . Other and unspecified hyperlipidemia   . Dysthymic disorder   . Other and unspecified coagulation defects   . Diaphragmatic hernia without mention of obstruction or gangrene   . PAD (peripheral artery disease)   . Pulmonary embolism 06/2014    "both lungs"  . DVT (deep venous thrombosis) 06/2014    RLE  . GERD (gastroesophageal reflux disease)   . History of hiatal hernia   . Anxiety   . Malignant neoplasm of breast (female), unspecified site 2002    "right"  . SVT (supraventricular tachycardia) 11/07/2014  . On home oxygen therapy     "2L at night and prn" (11/08/2014)   Past Surgical History  Procedure Laterality Date  . Appendectomy  12/1977  . Cholecystectomy  12/1977  . Breast lumpectomy Right 01/2001  . Tubal ligation  1980's  . Cardiac catheterization  1990's  . Esophagogastroduodenoscopy (egd) with esophageal dilation  X 7    Allergies  Allergies  Allergen Reactions  . Ciprofloxacin Hives    HPI  69 year old female with history of DVT and pulmonary embolism in June of last year with significant hypoxia that lead to home O2 therapy and chronic therapy with Xarelto. The patient presented to the ER on 11/16/2014 with shortness of breath abdominal pain nausea and hemoptysis. She has also been complaining of palpitations. Abdominal CT didn't show any abnormalities. 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 TSH was normal and in her records state that she had elevated catecholamines but there is no on evidence of that. The patient states that she has palpitations that happen several times a day. Are associated with shortness of  breath. The patient has been noticing progressively worsening shortness of breath but no chest pain. No syncope, orthopnea or paroxysmal nocturnal dyspnea. She has also noticed that her heart rate has been elevated for most part of the day. She has never smoked. Her blood pressure is a very low at baseline.  Home Medications  Prior to Admission medications   Medication Sig Start Date End Date Taking? Authorizing Provider  ALPRAZolam (XANAX) 0.25 MG tablet Take 0.25 mg by mouth daily.    Yes Historical Provider, MD  CARAFATE 1 GM/10ML suspension Take 1 mL by mouth daily. 11/17/14  Yes Historical Provider, MD  clobetasol ointment (TEMOVATE) 0.05 % Apply 9.92 application topically as needed. itching 10/09/14  Yes Historical Provider, MD  Desvenlafaxine Succinate ER (PRISTIQ) 25 MG TB24 Take 1 tablet by mouth daily.   Yes Historical Provider, MD  escitalopram (LEXAPRO) 10 MG tablet Take 10 mg by mouth daily. 11/24/14  Yes Historical Provider, MD  ondansetron (ZOFRAN) 4 MG tablet Take 1 tablet (4 mg total) by mouth every 8 (eight) hours as needed for nausea or vomiting. 10/03/14  Yes Inda Castle, MD  ondansetron (ZOFRAN-ODT) 4 MG disintegrating tablet Take 4 mg by mouth as needed. vomiting 11/17/14  Yes Historical Provider, MD  pantoprazole (PROTONIX) 40 MG tablet Take 1 tablet (40 mg total) by mouth daily. 11/09/14  Yes Nishant Dhungel, MD  promethazine (PHENERGAN) 25 MG suppository Place 1 suppository (25 mg total) rectally every 6 (six)  hours as needed for nausea or vomiting. 11/20/14  Yes Ernestina Patches, MD  promethazine (PHENERGAN) 25 MG tablet Take 25 mg by mouth as needed. nausea 12/09/14  Yes Historical Provider, MD  rivaroxaban (XARELTO) 20 MG TABS tablet Take 20 mg by mouth daily with supper.   Yes Historical Provider, MD    Family History  Family History  Problem Relation Age of Onset  . Heart disease Father   . Heart attack Father   . Colon cancer Neg Hx   . Lung cancer Maternal  Grandfather   . Deep vein thrombosis Daughter     Social History  History   Social History  . Marital Status: Married    Spouse Name: N/A    Number of Children: 2  . Years of Education: N/A   Occupational History  . Unemployed    Social History Main Topics  . Smoking status: Former Smoker -- 1.50 packs/day for 38 years    Types: Cigarettes    Quit date: 12/08/1998  . Smokeless tobacco: Never Used  . Alcohol Use: 27.6 oz/week    32 Shots of liquor, 14 Not specified per week     Comment: 11/08/2014 "2-3, 1 1/2shot drinks/night"  . Drug Use: No  . Sexual Activity: Yes    Birth Control/ Protection: None   Other Topics Concern  . Not on file   Social History Narrative     Review of Systems, as per HPI, otherwise negative General:  No chills, fever, night sweats or weight changes.  Cardiovascular:  No chest pain, dyspnea on exertion, edema, orthopnea, palpitations, paroxysmal nocturnal dyspnea. Dermatological: No rash, lesions/masses Respiratory: No cough, dyspnea Urologic: No hematuria, dysuria Abdominal:   No nausea, vomiting, diarrhea, bright red blood per rectum, melena, or hematemesis Neurologic:  No visual changes, wkns, changes in mental status. All other systems reviewed and are otherwise negative except as noted above.  Physical Exam  There were no vitals taken for this visit.  General: Pleasant, NAD Psych: Normal affect. Neuro: Alert and oriented X 3. Moves all extremities spontaneously. HEENT: Normal  Neck: Supple without bruits or JVD. Lungs:  Resp regular and unlabored, CTA. Heart: RRR no s3, s4, or murmurs. Abdomen: Soft, non-tender, non-distended, BS + x 4.  Extremities: No clubbing, cyanosis or edema. DP/PT/Radials 2+ and equal bilaterally.  Labs:  No results for input(s): CKTOTAL, CKMB, TROPONINI in the last 72 hours. Lab Results  Component Value Date   WBC 9.3 11/20/2014   HGB 17.7* 11/20/2014   HCT 52.0* 11/20/2014   MCV 101.4* 11/20/2014     PLT 272 11/20/2014    No results found for: DDIMER Invalid input(s): POCBNP    Component Value Date/Time   NA 139 11/20/2014 1640   NA 141 07/12/2014 1500   K 3.2* 11/20/2014 1640   K 3.8 07/12/2014 1500   CL 102 11/20/2014 1640   CL 104 03/02/2013 1531   CO2 26 11/09/2014 0708   CO2 26 07/12/2014 1500   GLUCOSE 134* 11/20/2014 1640   GLUCOSE 113 07/12/2014 1500   GLUCOSE 132* 03/02/2013 1531   BUN 8 11/20/2014 1640   BUN 8.9 07/12/2014 1500   CREATININE 1.10 11/20/2014 1640   CREATININE 0.8 07/12/2014 1500   CALCIUM 7.5* 11/09/2014 0708   CALCIUM 9.0 07/12/2014 1500   PROT 6.0 11/09/2014 0708   PROT 7.1 07/12/2014 1500   ALBUMIN 2.6* 11/09/2014 0708   ALBUMIN 3.2* 07/12/2014 1500   AST 53* 11/09/2014 0708   AST 37* 07/12/2014  1500   ALT 34 11/09/2014 0708   ALT 26 07/12/2014 1500   ALKPHOS 84 11/09/2014 0708   ALKPHOS 80 07/12/2014 1500   BILITOT 1.6* 11/09/2014 0708   BILITOT 0.56 07/12/2014 1500   GFRNONAA 83* 11/09/2014 0708   GFRAA >90 11/09/2014 0708   No results found for: CHOL, HDL, LDLCALC, TRIG   TSH 3.3 Free T4 1.32  Accessory Clinical Findings  Echocardiogram - 11/2014 Left ventricle: The cavity size was normal. Systolic function was normal. The estimated ejection fraction was in the range of 60% to 65%. Wall motion was normal; there were no regional wall motion abnormalities. Doppler parameters are consistent with abnormal left ventricular relaxation (grade 1 diastolic dysfunction).  ECG - Sinus tachycardia, rightward axis, otherwise normal    Assessment & Plan  69 year old female  1. Palpitations - normal TSH, normal echo, ? H/o elevated catecholamines (no lab in epic). Since she has these episodes multiple times a day we will schedule a 48 hour Holter monitor to evaluate for arrhythmias.  2. Dyspnea on exertion - we'll schedule Ubal[facility] Glassing can nuclear stress test to evaluate for ischemia.  Follow up in     Dorothy Spark,  MD, [facility] 12/11/2014, 12:35 PM

## 2014-12-11 NOTE — Patient Instructions (Signed)
Your physician recommends that you continue on your current medications as directed. Please refer to the Current Medication list given to you today.   Your physician has recommended that you wear a 48 hour holter monitor. Holter monitors are medical devices that record the heart's electrical activity. Doctors most often use these monitors to diagnose arrhythmias. Arrhythmias are problems with the speed or rhythm of the heartbeat. The monitor is a small, portable device. You can wear one while you do your normal daily activities. This is usually used to diagnose what is causing palpitations/syncope (passing out).   Your physician has requested that you have a lexiscan myoview. For further information please visit [email or web address]. Please follow instruction sheet, as given.    Your physician recommends that you schedule a follow-up appointment in: WITH DR NELSON IN 6 WEEKS

## 2014-12-12 ENCOUNTER — Encounter: Payer: Self-pay | Admitting: Vascular Surgery

## 2014-12-13 ENCOUNTER — Ambulatory Visit: Payer: Medicare Other | Admitting: Vascular Surgery

## 2014-12-13 ENCOUNTER — Encounter (HOSPITAL_COMMUNITY): Payer: Medicare Other

## 2014-12-13 ENCOUNTER — Ambulatory Visit: Payer: Medicare Other | Admitting: Endocrinology

## 2014-12-14 ENCOUNTER — Encounter: Payer: Self-pay | Admitting: *Deleted

## 2014-12-14 ENCOUNTER — Ambulatory Visit (HOSPITAL_COMMUNITY): Payer: Medicare Other | Attending: Cardiology | Admitting: Radiology

## 2014-12-14 ENCOUNTER — Encounter (INDEPENDENT_AMBULATORY_CARE_PROVIDER_SITE_OTHER): Payer: Medicare Other

## 2014-12-14 DIAGNOSIS — R0602 Shortness of breath: Secondary | ICD-10-CM

## 2014-12-14 DIAGNOSIS — R002 Palpitations: Secondary | ICD-10-CM

## 2014-12-14 DIAGNOSIS — R0609 Other forms of dyspnea: Secondary | ICD-10-CM | POA: Diagnosis not present

## 2014-12-14 DIAGNOSIS — I70219 Atherosclerosis of native arteries of extremities with intermittent claudication, unspecified extremity: Secondary | ICD-10-CM

## 2014-12-14 MED ORDER — REGADENOSON 0.4 MG/5ML IV SOLN
0.4000 mg | Freq: Once | INTRAVENOUS | Status: AC
  Administered 2014-12-14: 0.4 mg via INTRAVENOUS

## 2014-12-14 MED ORDER — TECHNETIUM TC 99M SESTAMIBI GENERIC - CARDIOLITE
11.0000 | Freq: Once | INTRAVENOUS | Status: AC | PRN
  Administered 2014-12-14: 11 via INTRAVENOUS

## 2014-12-14 MED ORDER — TECHNETIUM TC 99M SESTAMIBI GENERIC - CARDIOLITE
33.0000 | Freq: Once | INTRAVENOUS | Status: AC | PRN
  Administered 2014-12-14: 33 via INTRAVENOUS

## 2014-12-14 NOTE — Progress Notes (Signed)
[place] [address], [place] [address] [phone]    Cardiology Nuclear Med Study  Theresa [name] is a 70 y.o. female     MRN : [id number]     DOB: [birth year]  Procedure Date: 12/14/2014  Nuclear Med Background Indication for Stress Test:  Evaluation for Ischemia and [facility]  12/15 Dyspnea, SVT History:  No known CAD, SVT, DVT, PE, PAD, Fam. Hx. Cardiac Risk Factors: N/A  Symptoms:  DOE and Palpitations   Nuclear Pre-Procedure Caffeine/Decaff Intake:  None NPO After: 10:00pm   Lungs:  clear O2 Sat: 99% on 2L O2. IV 0.9% NS with Angio Cath:  22g  IV Site: L Hand  IV Started by:  Crissie Figures, RN  Chest Size (in):  38 Cup Size: C  Height: 5\' 2"  (1.575 m)  Weight:  150 lb (68.04 kg)  BMI:  Body mass index is 27.43 kg/(m^2). Tech Comments:  N/A    Nuclear Med Study 1 or 2 day study: 1 day  Stress Test Type:  Lexiscan  Reading MD: N/A  Order Authorizing Provider:  Ena Dawley, MD  Resting Radionuclide: Technetium 78m Sestamibi  Resting Radionuclide Dose: 11.0 mCi   Stress Radionuclide:  Technetium 13m Sestamibi  Stress Radionuclide Dose: 33.0 mCi           Stress Protocol Rest HR: 108 Stress HR: 122  Rest BP: 133/82 Stress BP:  98/64  Exercise Time (min): n/a METS: n/a   Predicted Max HR: 151 bpm % Max HR: 80.79 bpm Rate Pressure Product: 13298   Dose of Adenosine (mg):  n/a Dose of Lexiscan: 0.4 mg  Dose of Atropine (mg): n/a Dose of Dobutamine: n/a mcg/kg/min (at max HR)  Stress Test Technologist: Glade Lloyd, BS-ES  Nuclear Technologist:  Earl Many, CNMT     Rest Procedure:  Myocardial perfusion imaging was performed at rest 45 minutes following the intravenous administration of Technetium 30m Sestamibi. Rest ECG: NSR with non-specific ST-T wave changes  Stress Procedure:  The patient received IV Lexiscan 0.4 mg over 15-seconds.  Technetium 48m Sestamibi injected at 30-seconds.  Quantitative spect  images were obtained after a 45 minute delay.  During the infusion of Lexiscan the patient complained of chest tightness that resolved in recovery.  Stress ECG: No significant change from baseline ECG  QPS Raw Data Images:  Normal; no motion artifact; normal heart/lung ratio. Stress Images:  Normal homogeneous uptake in all areas of the myocardium. Rest Images:  Normal homogeneous uptake in all areas of the myocardium. Subtraction (SDS):  No evidence of ischemia. Transient Ischemic Dilatation (Normal <1.22):  1.06 Lung/Heart Ratio (Normal <0.45):  0.22  Quantitative Gated Spect Images QGS EDV:  32 ml QGS ESV:  8 ml  Impression Exercise Capacity:  Lexiscan with no exercise. BP Response:  Normal blood pressure response. Clinical Symptoms:  Typical symptoms with Lexiscan ECG Impression:  No significant ST segment change suggestive of ischemia. Comparison with Prior Nuclear Study: No images to compare  Overall Impression:  Low risk stress nuclear study with no evidence of significant ischemia identified..  LV Ejection Fraction: 75%.  LV Wall Motion:  NL LV Function; NL Wall Motion  Candee Furbish, MD

## 2014-12-14 NOTE — Progress Notes (Signed)
Patient ID: Theresa [name], female   DOB: 03-Oct-1946, 69 y.o.   MRN: [id number] [employer] 48 hour holter monitor applied to patient.

## 2014-12-15 ENCOUNTER — Telehealth: Payer: Self-pay | Admitting: Cardiology

## 2014-12-15 NOTE — Telephone Encounter (Signed)
New Msg        Pt calling to get results of nuclear stress test.   Please call if results are avail.

## 2014-12-15 NOTE — Telephone Encounter (Signed)
Pt given myoview results.

## 2014-12-18 ENCOUNTER — Ambulatory Visit (INDEPENDENT_AMBULATORY_CARE_PROVIDER_SITE_OTHER): Payer: Medicare Other | Admitting: Endocrinology

## 2014-12-18 ENCOUNTER — Encounter: Payer: Self-pay | Admitting: Endocrinology

## 2014-12-18 VITALS — BP 111/78 | HR 138 | Temp 98.0°F | Resp 14 | Ht 62.0 in | Wt 160.2 lb

## 2014-12-18 DIAGNOSIS — R630 Anorexia: Secondary | ICD-10-CM

## 2014-12-18 DIAGNOSIS — I70219 Atherosclerosis of native arteries of extremities with intermittent claudication, unspecified extremity: Secondary | ICD-10-CM

## 2014-12-18 DIAGNOSIS — I471 Supraventricular tachycardia: Secondary | ICD-10-CM

## 2014-12-18 DIAGNOSIS — I950 Idiopathic hypotension: Secondary | ICD-10-CM

## 2014-12-18 NOTE — Progress Notes (Signed)
Patient ID: Theresa [name], female   DOB: 15-Nov-1946, 69 y.o.   MRN: [id number]  Referring physician: Dr. Harrington Challenger  Chief complaint: Shortness of breath  History of Present Illness:  The patient has a complicated medical history with numerous physical problems especially over the last few months. Apparently because of her tendency to having a rapid heart rate persistently she was evaluated by her primary care physician with a catecholamine level.  This was done randomly at noon and the specimen was drawn without patient being rested. Her norepinephrine level was 2081, normal < 874  She is now referred here for further evaluation. The patient apparently has had a rapid heart rate for several months and she associates this with getting out of breath with exertion She does not complain of palpitations herself. She has not been treated with a beta blocker. She does not have any history of hypertension and no history of flushing or headaches. She also has had a CT scan of her abdomen in 12/15 which did not show any abnormality of her adrenal glands or any other lesion in the abdomen                  /                        Past Medical History  Diagnosis Date  . Stricture and stenosis of esophagus   . Other and unspecified hyperlipidemia   . Dysthymic disorder   . Other and unspecified coagulation defects   . Diaphragmatic hernia without mention of obstruction or gangrene   . PAD (peripheral artery disease)   . Pulmonary embolism 06/2014    "both lungs"  . DVT (deep venous thrombosis) 06/2014    RLE  . GERD (gastroesophageal reflux disease)   . History of hiatal hernia   . Anxiety   . Malignant neoplasm of breast (female), unspecified site 2002    "right"  . SVT (supraventricular tachycardia) 11/07/2014  . On home oxygen therapy     "2L at night and prn" (11/08/2014)    Past Surgical History  Procedure Laterality Date  . Appendectomy  12/1977  . Cholecystectomy   12/1977  . Breast lumpectomy Right 01/2001  . Tubal ligation  1980's  . Cardiac catheterization  1990's  . Esophagogastroduodenoscopy (egd) with esophageal dilation  X 7    Family History  Problem Relation Age of Onset  . Heart disease Father   . Heart attack Father   . Colon cancer Neg Hx   . Lung cancer Maternal Grandfather   . Deep vein thrombosis Daughter     Social History:  reports that she quit smoking about 16 years ago. Her smoking use included Cigarettes. She has a 57 pack-year smoking history. She has never used smokeless tobacco. She reports that she drinks about 27.6 oz of alcohol per week. She reports that she does not use illicit drugs.  Allergies:  Allergies  Allergen Reactions  . Ciprofloxacin Hives      Medication List       This list is accurate as of: 12/18/14  2:36 PM.  Always use your most recent med list.               ALPRAZolam 0.25 MG tablet  Commonly known as:  XANAX  Take 0.25 mg by mouth daily.     CARAFATE 1 GM/10ML suspension  Generic drug:  sucralfate  Take 1 mL by  mouth daily.     clobetasol ointment 0.05 %  Commonly known as:  TEMOVATE  Apply 7.68 application topically as needed. itching     escitalopram 10 MG tablet  Commonly known as:  LEXAPRO  Take 10 mg by mouth daily.     ondansetron 4 MG disintegrating tablet  Commonly known as:  ZOFRAN-ODT  Take 4 mg by mouth as needed. vomiting     pantoprazole 40 MG tablet  Commonly known as:  PROTONIX  Take 1 tablet (40 mg total) by mouth daily.     PRISTIQ 25 MG Tb24  Generic drug:  Desvenlafaxine Succinate ER  Take 1 tablet by mouth daily.     promethazine 25 MG suppository  Commonly known as:  PHENERGAN  Place 1 suppository (25 mg total) rectally every 6 (six) hours as needed for nausea or vomiting.     promethazine 25 MG tablet  Commonly known as:  PHENERGAN  Take 25 mg by mouth as needed. nausea     rivaroxaban 20 MG Tabs tablet  Commonly known as:  XARELTO  Take 20  mg by mouth daily with supper.        LABS:  No visits with results within 1 Week(s) from this visit. Latest known visit with results is:  Admission on 11/20/2014, Discharged on 11/20/2014  Component Date Value Ref Range Status  . WBC 11/20/2014 9.3  4.0 - 10.5 K/uL Final  . RBC 11/20/2014 4.44  3.87 - 5.11 MIL/uL Final  . Hemoglobin 11/20/2014 15.6* 12.0 - 15.0 g/dL Final  . HCT 11/20/2014 45.0  36.0 - 46.0 % Final  . MCV 11/20/2014 101.4* 78.0 - 100.0 fL Final  . MCH 11/20/2014 35.1* 26.0 - 34.0 pg Final  . MCHC 11/20/2014 34.7  30.0 - 36.0 g/dL Final  . RDW 11/20/2014 15.1  11.5 - 15.5 % Final  . Platelets 11/20/2014 272  150 - 400 K/uL Final  . Neutrophils Relative % 11/20/2014 69  43 - 77 % Final  . Neutro Abs 11/20/2014 6.4  1.7 - 7.7 K/uL Final  . Lymphocytes Relative 11/20/2014 21  12 - 46 % Final  . Lymphs Abs 11/20/2014 2.0  0.7 - 4.0 K/uL Final  . Monocytes Relative 11/20/2014 9  3 - 12 % Final  . Monocytes Absolute 11/20/2014 0.8  0.1 - 1.0 K/uL Final  . Eosinophils Relative 11/20/2014 1  0 - 5 % Final  . Eosinophils Absolute 11/20/2014 0.1  0.0 - 0.7 K/uL Final  . Basophils Relative 11/20/2014 0  0 - 1 % Final  . Basophils Absolute 11/20/2014 0.0  0.0 - 0.1 K/uL Final  . Sodium 11/20/2014 139  137 - 147 mEq/L Final  . Potassium 11/20/2014 3.2* 3.7 - 5.3 mEq/L Final  . Chloride 11/20/2014 102  96 - 112 mEq/L Final  . BUN 11/20/2014 8  6 - 23 mg/dL Final  . Creatinine, Ser 11/20/2014 1.10  0.50 - 1.10 mg/dL Final  . Glucose, Bld 11/20/2014 134* 70 - 99 mg/dL Final  . Calcium, Ion 11/20/2014 0.94* 1.13 - 1.30 mmol/L Final  . TCO2 11/20/2014 20  0 - 100 mmol/L Final  . Hemoglobin 11/20/2014 17.7* 12.0 - 15.0 g/dL Final  . HCT 11/20/2014 52.0* 36.0 - 46.0 % Final  . aPTT 11/20/2014 40* 24 - 37 seconds Final   Comment:        IF BASELINE aPTT IS ELEVATED, SUGGEST PATIENT RISK ASSESSMENT BE USED TO DETERMINE APPROPRIATE ANTICOAGULANT THERAPY.   . Prothrombin Time  11/20/2014 27.9* 11.6 - 15.2 seconds Final  . INR 11/20/2014 2.59* 0.00 - 1.49 Final     REVIEW OF SYSTEMS:             Skin: No rash, skin lesions, abnormal pigmentation or nodules.     Thyroid:  No cold or heat intolerance.  She does complain of feeling significantly tired and recent TSH normal at 4.1     His blood pressure has been mostly relatively low, low normal and only rarely has been high on review of her previous encounters.     She is being evaluated by cardiologist for persistent tachycardia with Holter  monitor.  She believes her heart rate has been as high as 190 at times     No swelling of feet.     She has significant shortness of breath on exertion and has been unable to do much physical activity.  She has a pulse oximeter at home and she thinks the readings are in the 90s. Her shortness of breath started after her admission for multiple pulmonary embolism in 7/15. She uses oxygen at night as recommended  by the pulmonologist in 9/15     Has minimal abdominal pain recently.  Bowel habits:  No change, has had mild loose stools for years after her cholecystectomy.   Patient has had intermittent nausea over the last 2 years or so which has been severe at times especially in the mornings.  Only occasionally has some vomiting and will bring up only mucus. Has had several endoscopies which have been negative. Also periodically she the will have decreased appetite including the last couple of months  Her weight tends to fluctuate but despite decreased appetite has not lost any weight recently.  Her previous weight has been as low as 140       No frequency of urination or excessive nocturia      No joint  pains.      No  depression or anxiety.    PHYSICAL EXAM:  BP 111/78 mmHg  Pulse 138  Temp(Src) 98 F (36.7 C)  Resp 14  Ht 5\' 2"  (1.575 m)  Wt 160 lb 3.2 oz (72.666 kg)  BMI 29.29 kg/m2  SpO2 91%  Standing blood pressure 80/60  GENERAL: Well-built and  nourished, comfortable at rest  No pallor, clubbing, lymphadenopathy or edema.  Skin:  no rash or pigmented lesions.  EYES:  Externally normal.  Fundi show normal discs.  ENT: Oral mucosa and tongue normal.  THYROID:  Not palpable.  HEART: Heart rhythm appears regular.  Normal  S1 and S2; no murmur or click.  CHEST:  Normal shape.  Lungs: Vescicular breath sounds heard equally.  No crepitations/ wheeze.  ABDOMEN:  No distention.  Scar of cholecystectomy present  Liver and spleen not palpable.  No other mass or tenderness.  NEUROLOGICAL: .Reflexes are normal bilaterally at biceps.  No tremor present. Motor power appears normal bilaterally  JOINTS:  Normal peripheral joints and spine appears normal.  No pedal edema present  ASSESSMENT:   Complex medical history no typical symptoms suggestive of pheochromocytoma She appears to have persistent tachycardia without hyperthyroidism She may have tachycardia related to her relatively low blood pressure especially on standing up Also unclear if she has had atrial arrhythmias and to monitor report is pending  She does have an unusually high norepinephrine and this will need to be followed up even though she does not have evidence of adrenal adenoma on CT scan  Her  decreased appetite and persistent nausea along with tendency to low blood pressure may indicate adrenal insufficiency and this needs to be ruled out  History of multiple pulmonary emboli currently on anticoagulation.  PLAN:   She will have plasma metanephrines drawn in the morning fasting state after 15 minutes of resting supine If she has a positive result will need to consider MIBG scan  She will also have ACTH stim relation test to rule out adrenal insufficiency  Have recommended that she discuss her dyspnea with pulmonologist again  [facility] 12/18/2014, 2:36 PM

## 2014-12-21 ENCOUNTER — Other Ambulatory Visit: Payer: Self-pay | Admitting: Medical Oncology

## 2014-12-21 ENCOUNTER — Telehealth: Payer: Self-pay | Admitting: Medical Oncology

## 2014-12-21 ENCOUNTER — Telehealth: Payer: Self-pay | Admitting: Cardiology

## 2014-12-21 DIAGNOSIS — C50919 Malignant neoplasm of unspecified site of unspecified female breast: Secondary | ICD-10-CM

## 2014-12-21 NOTE — Telephone Encounter (Signed)
Had Ct angio and CXR  /labs in dec when she was in hospital. I told her to cancel the CT ordered for march and to keep appt with [facility].

## 2014-12-21 NOTE — Telephone Encounter (Signed)
New message     Want monitor results 

## 2014-12-22 ENCOUNTER — Telehealth: Payer: Self-pay | Admitting: Cardiology

## 2014-12-22 ENCOUNTER — Other Ambulatory Visit: Payer: Self-pay | Admitting: Medical Oncology

## 2014-12-22 MED ORDER — METOPROLOL TARTRATE 25 MG PO TABS: 12.5000 mg | ORAL_TABLET | Freq: Two times a day (BID) | ORAL | Status: DC

## 2014-12-22 NOTE — Telephone Encounter (Signed)
New Msg        Pt calling and would like results of monitor faxed to her husband.  Pt informed this may not be possible but msg would be sent to nurse as requested.  Pt husband fax # is [phone]

## 2014-12-22 NOTE — Progress Notes (Signed)
Cancelled CXR for march -pt had one in dec

## 2014-12-22 NOTE — Telephone Encounter (Signed)
Informed the pt that per Dr Meda Coffee her 24 hour holter monitor showed very frequent PVCs and persistent sinus tachycardia. Informed the pt that per Dr Meda Coffee this is called inappropriate sinus tachycardia and she should start taking Metoprolol Tartrate 12.5 mg po BID.  Informed the pt that given her history of hypotension, she should monitor her BP carefully with a home BP monitor, and report any low readings before taking this med.  Informed the pt that she should contact our office with any symptoms of dizziness, pre-syncopal, or syncopal episodes with taking the med, or without taking the med.  Pt states she does not have a BP cuff at home, but she reports when she goes to pick this med up, she will also purchase a BP monitor.  Confirmed the pharmacy of choice with the pt.  Pt verbalized understanding and agrees with this plan.

## 2014-12-22 NOTE — Telephone Encounter (Signed)
Informed the pt that until she signs the DPR for [facility], we cannot by law give this information out until she signs the form.  Pt verbalized understanding and agrees with this plan.

## 2014-12-22 NOTE — Progress Notes (Signed)
Patient ID: Theresa [name], female   DOB: 17-Jan-1946, 69 y.o.   MRN: [id number]  The patient underwent 48 hour Holter monitoring. It showed very frequent PVC, 4001 day and 2800 on the other day. Most importantly she has inappropriate sinus tachycardia being in sinus tachycardia approximately 18-20 hours a day even at night. Patient has normal left ventricular ejection fraction normal TSH and free T4. I believe this is his syndrome of inappropriate sinus tachycardia in ideally I would like to start her on a good dose of beta blockers however her blood pressure can be as low as 80/60. We will try to use metoprolol 12.5 mg orally twice a day if not tolerated by blood pressure and symptoms we will switch to Corlanor.  Dorothy Spark 12/22/2014

## 2014-12-25 ENCOUNTER — Encounter: Payer: Self-pay | Admitting: Gastroenterology

## 2014-12-25 ENCOUNTER — Telehealth: Payer: Self-pay | Admitting: Gastroenterology

## 2014-12-25 NOTE — Telephone Encounter (Signed)
Patient feels the same symptoms, but she doesn't want to wait until next week. Her abdomen hurts in the middle. She tries to eat but food in her mouth turns her stomach. She feels like she is going to vomit pretty much all the time. She is not taking the Carafate on her med list. She is on Protonix, phenergan, Lexapro, Zofran , Xanax and Xarelto.. She agrees to an appointment with Nevin Bloodgood, [place]. She has seen her in the past and has confidence in her.

## 2014-12-26 ENCOUNTER — Encounter: Payer: Self-pay | Admitting: Nurse Practitioner

## 2014-12-26 ENCOUNTER — Ambulatory Visit: Payer: Medicare Other | Admitting: Cardiology

## 2014-12-26 ENCOUNTER — Ambulatory Visit (INDEPENDENT_AMBULATORY_CARE_PROVIDER_SITE_OTHER): Payer: Medicare Other | Admitting: Nurse Practitioner

## 2014-12-26 VITALS — BP 92/64 | HR 112 | Ht 61.0 in | Wt 157.2 lb

## 2014-12-26 DIAGNOSIS — R059 Cough, unspecified: Secondary | ICD-10-CM

## 2014-12-26 DIAGNOSIS — R112 Nausea with vomiting, unspecified: Secondary | ICD-10-CM

## 2014-12-26 DIAGNOSIS — R05 Cough: Secondary | ICD-10-CM

## 2014-12-26 DIAGNOSIS — R110 Nausea: Secondary | ICD-10-CM

## 2014-12-26 DIAGNOSIS — I70219 Atherosclerosis of native arteries of extremities with intermittent claudication, unspecified extremity: Secondary | ICD-10-CM

## 2014-12-26 MED ORDER — METOCLOPRAMIDE HCL 5 MG PO TABS: ORAL_TABLET | ORAL | Status: DC

## 2014-12-26 NOTE — Patient Instructions (Signed)
We sent a prescription to [facility] [address] for Reglan ( Metoclopramide). Keep the appointment with Dr. Deatra Ina on 01-04-2015 at 1:45 PM.

## 2014-12-27 ENCOUNTER — Other Ambulatory Visit (INDEPENDENT_AMBULATORY_CARE_PROVIDER_SITE_OTHER): Payer: Medicare Other

## 2014-12-27 ENCOUNTER — Encounter: Payer: Self-pay | Admitting: Nurse Practitioner

## 2014-12-27 DIAGNOSIS — I471 Supraventricular tachycardia: Secondary | ICD-10-CM

## 2014-12-27 DIAGNOSIS — R05 Cough: Secondary | ICD-10-CM | POA: Insufficient documentation

## 2014-12-27 DIAGNOSIS — R059 Cough, unspecified: Secondary | ICD-10-CM | POA: Insufficient documentation

## 2014-12-27 DIAGNOSIS — R630 Anorexia: Secondary | ICD-10-CM

## 2014-12-27 DIAGNOSIS — R110 Nausea: Secondary | ICD-10-CM | POA: Insufficient documentation

## 2014-12-27 DIAGNOSIS — I950 Idiopathic hypotension: Secondary | ICD-10-CM

## 2014-12-27 LAB — CORTISOL: Cortisol, Plasma: 17.1 ug/dL

## 2014-12-27 NOTE — Progress Notes (Signed)
     History of Present Illness:  Patient is a 69 year old female known to Dr. Deatra Ina. She has a history of chronic nausea, present for many years. I saw the patient in October at which time some changes were made to her antiemetics (please refer to that dictation). Patient was hospitalized in December with shortness of breath and hemoptysis versus hematemesis. We did see her in the hospital but EGD not felt necessary. Patient has not had any further episodes of hematemesis.   She comes in today with persistent nausea and complains of mid upper abdominal discomfort.The nausea is constant, Zofran helps but does not alleviate it. No significant weight loss.  She does vomit sometimes but it is always following episode vigorous coughing. Patient is accompanied by her husband today. We previously reviewed her medications, no culprits recognized. No acute findings on non-contrast CTscan of head May 2014.   Current Medications, Allergies, Past Medical History, Past Surgical History, Family History and Social History were reviewed in [employer] record.  Physical Exam: General: Pleasant, well developed , white female in no acute distress Head: Normocephalic and atraumatic Eyes:  sclerae anicteric, conjunctiva pink  Ears: Normal auditory acuity Lungs: Clear throughout to auscultation Heart: Regular rate and rhythm Abdomen: Soft, non distended, non-tender. No masses, no hepatomegaly. Normal bowel sounds Musculoskeletal: Symmetrical with no gross deformities  Extremities: No edema  Neurological: Alert oriented x 4, grossly nonfocal Psychological:  Alert and cooperative. Normal mood and affect  Assessment and Recommendations:   38. 69 year old female with chronic nausea with negative workup including upper endoscopies, abdominal CT scans, head CTscan and abdominal ultrasounds. No significant weight loss which is reassuring.  She was unable to tolerate gastric emptying scan in the  past. Though we have been hesitant to try her on Reglan , our options are running low at this point .   Trial of Reglan 5mg  BID ac for 5 days. I educated patient and husband about side effects to look for. If Reglan helps then can switch to Deomperidone  2. Vomiting induced by cough. Though patient has constant nausea, I believe the vomiting is induced by vigorous coughing which I witnessed in the exam room today. Both patient and her husband agree that the "vomiiting" is always preceded by vigorous coughing. The question is, what is causing the cough?   From the records she doesn't appear to have underlying lung problems.   Cough may be secondary to reflux. Will increase PPI to BID.   If cough / vomiting don't improve will schedule EGD with possible BRAVO. If negative study and symptoms persist then consider impedence study  .

## 2014-12-28 ENCOUNTER — Telehealth: Payer: Self-pay | Admitting: Endocrinology

## 2014-12-28 NOTE — Telephone Encounter (Signed)
Opt 1 [phone]  Lab has questions about lavendar tube they received

## 2014-12-28 NOTE — Progress Notes (Signed)
Reviewed and agree with management. Jaxyn Rout D. Ayisha Pol, M.D., FACG  

## 2015-01-01 ENCOUNTER — Telehealth: Payer: Self-pay | Admitting: Endocrinology

## 2015-01-01 NOTE — Telephone Encounter (Signed)
Patient is calling for the results of her lab work °

## 2015-01-01 NOTE — Telephone Encounter (Signed)
Not back

## 2015-01-01 NOTE — Telephone Encounter (Signed)
Please see below and advise.

## 2015-01-04 ENCOUNTER — Encounter: Payer: Self-pay | Admitting: Gastroenterology

## 2015-01-04 ENCOUNTER — Ambulatory Visit (INDEPENDENT_AMBULATORY_CARE_PROVIDER_SITE_OTHER)
Admission: RE | Admit: 2015-01-04 | Discharge: 2015-01-04 | Disposition: A | Discharge status: Home / Self Care | Payer: Medicare Other | Source: Ambulatory Visit | Attending: Adult Health | Admitting: Adult Health

## 2015-01-04 ENCOUNTER — Ambulatory Visit (INDEPENDENT_AMBULATORY_CARE_PROVIDER_SITE_OTHER): Payer: Medicare Other | Admitting: Gastroenterology

## 2015-01-04 ENCOUNTER — Ambulatory Visit (INDEPENDENT_AMBULATORY_CARE_PROVIDER_SITE_OTHER): Payer: Medicare Other | Admitting: Adult Health

## 2015-01-04 VITALS — BP 138/88 | HR 98

## 2015-01-04 VITALS — Ht 61.0 in | Wt 157.2 lb

## 2015-01-04 DIAGNOSIS — I2699 Other pulmonary embolism without acute cor pulmonale: Secondary | ICD-10-CM

## 2015-01-04 DIAGNOSIS — I70219 Atherosclerosis of native arteries of extremities with intermittent claudication, unspecified extremity: Secondary | ICD-10-CM

## 2015-01-04 DIAGNOSIS — J9611 Chronic respiratory failure with hypoxia: Secondary | ICD-10-CM

## 2015-01-04 DIAGNOSIS — R0600 Dyspnea, unspecified: Secondary | ICD-10-CM

## 2015-01-04 DIAGNOSIS — R0689 Other abnormalities of breathing: Secondary | ICD-10-CM

## 2015-01-04 DIAGNOSIS — F341 Dysthymic disorder: Secondary | ICD-10-CM

## 2015-01-04 MED ORDER — METOCLOPRAMIDE HCL 10 MG PO TABS: 10.0000 mg | ORAL_TABLET | Freq: Three times a day (TID) | ORAL | Status: DC

## 2015-01-04 NOTE — Assessment & Plan Note (Signed)
Compensated on O2  Check on return ? Cont need for O2 6 months out from PE May need PFT to evaluate for underlying lung dx w/ previous smoking hx.

## 2015-01-04 NOTE — Progress Notes (Signed)
Subjective:    Patient ID: Theresa [name], female    DOB: December 19, 1945, 69 y.o.   MRN: [id number]  HPI 69 yo former smoker, hx of Stage 1 breast CA ('02) and a recently dx R LE DVT + PE in 06/2014. She was started on xarelto for therapeutic anticoagulation.  She underwent a hypercoag panel in the setting of her clot and rx, low Prot C and ATIII levels (significance unclear).   01/04/2015 Acute OV  Pt presents for an acute office visit.  Was seen by GI today for ongoing evaluation for nausea.  She was referred to our office for work in visit due to dyspnea. She is accompanied by her husband who says she gets these episodes where she gets very winded and starts breathing very fast. She is somewhat anxious and hyperventilating. O2 sats 100% on arrival on her chroinc O2 at 2l/m . HR reg at 95. EKG showed NSR , nonspecific changes. No sign change from last EKG . Recent stress test 12/15/14 was nml, w/ no nml EF and no ischemia noted.  She has been having these episodes on/off. Had repeat CT angio chest 11/08/14 taht showed no acute findings, prev. Sequela of prior thromboembolic dz, no recurrent acute PE.  She has been under a lot of stress lately and has had several workup for nausea and tachycardia.  Seen by endocrinology currently undergoing workup for adrenal insufficiency . Was r/out for pheochromocytoma.  She says she is compliant with her Xarelto.  No chest pain, exertional chest pain, calf pain. No hemoptysis .  She does admit to drinking ETOH daily w/ more than moderate use. It does seem to be an issue with daily living as she fell few days ago with bruising to scalp/forehead.  Denies HA, visual change, ext weakness or confusion.  We talked about cutting back and quitting, advised of dangers of etoh and her meds plus falls on Xarelto .   After a while of talking her breathing returned to normal and hyperventilation resolved. Says she is talking with a pyschiatrist .    Review of Systems    Constitutional: Negative for fever and unexpected weight change.  HENT: Negative for congestion, dental problem, ear pain, nosebleeds, postnasal drip, rhinorrhea, sinus pressure, sneezing, sore throat and trouble swallowing.   Eyes: Negative for redness and itching.  Respiratory: Positive for shortness of breath. Negative for cough, chest tightness and wheezing.   Cardiovascular: Negative for palpitations and leg swelling.  Gastrointestinal: Negative for nausea and vomiting.   Genitourinary: Negative for dysuria.   Skin: Negative for rash.  Neurological: Negative for headaches.  Hematological: Does not bruise/bleed easily.  Psychiatric/Behavioral: Positive for dysphoric mood. The patient is nervous/anxious.           Objective:   Physical Exam  Gen: Pleasant, well-nourished, anxious  ENT: No lesions,  mouth clear,  oropharynx clear, no postnasal drip  Neck: No JVD, no TMG, no carotid bruits  Lungs: No use of accessory muscles, no dullness to percussion, clear without rales or rhonchi, tachypnea /hyperventilating   Cardiovascular: RRR, heart sounds normal, no murmur or gallops, no peripheral edema  Musculoskeletal: No deformities, no cyanosis or clubbing  Neuro: alert, non focal, PERRLA , CN 2-12 intact  Neg pronator drift, nml grips,   Skin: Warm, no lesions or rashes    CXR 01/04/2015  FINDINGS: There is scarring in the left base. There is no edema or consolidation. The heart size and pulmonary vascularity are within normal limits.  No adenopathy. No bone lesions.  IMPRESSION: Scarring left base. No edema or consolidation.        Assessment & Plan:

## 2015-01-04 NOTE — Patient Instructions (Addendum)
Please continue your xarelto daily .  Chest xray today  Continue on Oxygen 2l/m  Please contact office for sooner follow up if symptoms do not improve or worsen or seek emergency care  If your symptoms do not improve or return will need to go to ER. Return in 4 weeks Dr. Lamonte Sakai  With PFT

## 2015-01-04 NOTE — Assessment & Plan Note (Signed)
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

## 2015-01-04 NOTE — Assessment & Plan Note (Signed)
On home O2 pulse oximetry measured 100% saturation.  Nonetheless, she appears dyspneic.  Plan stat evaluation either by pulmonary or referral to emergency room

## 2015-01-04 NOTE — Progress Notes (Signed)
      History of Present Illness:  Ms. Soth has returned for follow-up of nausea.  She took Reglan  for an unspecified period of time.  She continues to have nausea in the mornings.  She is unsure whether she is still taking this medication.  Since the summer when she was diagnosed with pulmonary emboli she has had dyspnea on exertion.  She is weak and also is complaining of dyspnea at rest.      Review of Systems: Pertinent positive and negative review of systems were noted in the above HPI section. All other review of systems were otherwise negative.    Current Medications, Allergies, Past Medical History, Past Surgical History, Family History and Social History were reviewed in [place] [facility] record  Vital signs were reviewed in today's medical record. Physical Exam: General: Ill-appearing female with obvious dyspnea Skin: anicteric Head: Normocephalic and atraumatic Eyes:  sclerae anicteric, EOMI Ears: Normal auditory acuity Mouth: No deformity or lesions Lungs: Clear throughout to auscultation Heart: Pulse is thready.  There are no murmurs Abdomen: Soft, non tender and non distended. No masses, hepatosplenomegaly or hernias noted. Normal Bowel sounds Rectal:deferred Musculoskeletal: Symmetrical with no gross deformities  Pulses:  Normal pulses noted Extremities: No clubbing, cyanosis, edema or deformities noted Neurological: Alert oriented x 4, grossly nonfocal Psychological:  Alert and cooperative. Normal mood and affect  See Assessment and Plan under Problem List

## 2015-01-04 NOTE — Patient Instructions (Signed)
We have sent the following medications to your pharmacy for you to pick up at your convenience: Reglan 10mg   Please go to 2nd floor pulmonology for you 3:45 appt.  Appt. Scheduled for 02-23-15 at 10:15am with Dr. Deatra Ina  LL:VDIX Harrington Challenger

## 2015-01-04 NOTE — Assessment & Plan Note (Addendum)
Suspect is multifactoral in natue  O2 sats are nml  HR ok, with no acute finding on EKG  Recent cardiac stress neg for ischemia,  Repeat CT chest neg for acute PE.  Ongoing w/up for adrenal insufficiency.  Check  PFT on return  CXR today

## 2015-01-04 NOTE — Assessment & Plan Note (Addendum)
She has had  little vomiting but continues to complain of nausea.  She was unable to complete a gastric emptying scan.  There is not been a robust response with low-dose Reglan (5 mg before meals and at bedtime).  Nausea could be due to her polypharmacy.  Gastroparesis is also a concern.  Recommendations #1 increase Reglan to 10 mg one half hour before meals and at bedtime

## 2015-01-04 NOTE — Assessment & Plan Note (Signed)
PE on Xarelto  Recent evaluation for ongoing dyspnea with repeat CT chest Angio 11/2014 neg for recurrent acute PE.   Plan  Cont on Xarelto .  follow up Dr. Lamonte Sakai  In 6 weeks and As needed

## 2015-01-08 ENCOUNTER — Telehealth: Payer: Self-pay | Admitting: Adult Health

## 2015-01-08 ENCOUNTER — Telehealth: Payer: Self-pay | Admitting: Cardiology

## 2015-01-08 NOTE — Telephone Encounter (Signed)
Checked on progress of appt w/ cards Pt has appt scheduled with Dr Meda Coffee tomorrow 2.2.16 TP is aware

## 2015-01-08 NOTE — Telephone Encounter (Signed)
error 

## 2015-01-08 NOTE — Telephone Encounter (Signed)
Spoke with pt today , she is feeling better from last ov.  Pt with recent significant card workup with holter monitor , stress myoview w/ essentially neg w/up  . EKG review w/ past EKG shows nonspecific ST changes and  mild ST depression V4 and V5 that I do not see on previous EKG . Pt denies Chest pain.  Do recommend she make ov with cardiology to review as she continues to have these episodes of weakness and dyspnea.  Pt aware and wants to call cardiology for ov .  Will forward to Dr. Meda Coffee -cards.  Will check to make sure ov has been made.  Please contact office for sooner follow up if symptoms do not improve or worsen or seek emergency care

## 2015-01-09 ENCOUNTER — Ambulatory Visit (INDEPENDENT_AMBULATORY_CARE_PROVIDER_SITE_OTHER): Payer: Medicare Other | Admitting: Cardiology

## 2015-01-09 ENCOUNTER — Encounter: Payer: Self-pay | Admitting: Cardiology

## 2015-01-09 VITALS — BP 90/60 | HR 102 | Ht 61.5 in | Wt 155.0 lb

## 2015-01-09 DIAGNOSIS — I70219 Atherosclerosis of native arteries of extremities with intermittent claudication, unspecified extremity: Secondary | ICD-10-CM

## 2015-01-09 DIAGNOSIS — I471 Supraventricular tachycardia: Secondary | ICD-10-CM

## 2015-01-09 DIAGNOSIS — I872 Venous insufficiency (chronic) (peripheral): Secondary | ICD-10-CM

## 2015-01-09 MED ORDER — IVABRADINE HCL 5 MG PO TABS: 5.0000 mg | ORAL_TABLET | Freq: Two times a day (BID) | ORAL | Status: DC

## 2015-01-09 NOTE — Patient Instructions (Addendum)
Your physician has recommended you make the following change in your medication:   START TAKING [place] METOPROLOL NOW   Your physician has requested that you have a lower extremity arterial duplex. This test is an ultrasound of the arteries in the legs or arms. It looks at arterial blood flow in the legs and arms. Allow one hour for Lower and Upper Arterial scans. There are no restrictions or special instructions HAVE THIS SCHEDULED PRIOR TO YOUR 3 WEEK FOLLOW-UP APPOINTMENT WITH DR Meda Coffee  You have been referred to [place]    Your physician recommends that you schedule a follow-up appointment in: [place]

## 2015-01-09 NOTE — Progress Notes (Signed)
Patient ID: MEKIA DIPINTO, female   DOB: 18-Oct-1946, 69 y.o.   MRN: [id number]     Patient Name: Theresa [name] Date of Encounter: 01/09/2015  Primary Care Provider:   Melinda Crutch, MD Primary Cardiologist:  Dorothy Spark  Problem List   Past Medical History  Diagnosis Date  . Esophageal stricture   . HLD (hyperlipidemia)   . Dysthymic disorder   . Other and unspecified coagulation defects   . Diaphragmatic hernia without mention of obstruction or gangrene   . PAD (peripheral artery disease)   . Pulmonary embolism 06/2014    "both lungs"  . DVT (deep venous thrombosis) 06/2014    RLE  . GERD (gastroesophageal reflux disease)   . History of hiatal hernia   . Anxiety   . Malignant neoplasm of breast (female), unspecified site 2002    "right", NO BLOOD PRESSURES OR STICKS IN RIGHT ARM  . SVT (supraventricular tachycardia) 11/07/2014  . On home oxygen therapy     "2L at night and prn" (11/08/2014)  . Atherosclerosis   . Alcohol abuse   . Chronic respiratory failure   . Colitis    Past Surgical History  Procedure Laterality Date  . Appendectomy  12/1977  . Cholecystectomy  12/1977  . Breast lumpectomy Right 01/2001  . Tubal ligation  1980's  . Cardiac catheterization  1990's  . Esophagogastroduodenoscopy (egd) with esophageal dilation  X 7   Allergies  Allergies  Allergen Reactions  . Ciprofloxacin Hives    HPI  69 year old female with history of DVT and pulmonary embolism in June of last year with significant hypoxia that lead to home O2 therapy and chronic therapy with Xarelto. The patient presented to the ER on 11/16/2014 with shortness of breath abdominal pain nausea and hemoptysis. She has also been complaining of palpitations. Abdominal CT didn't show any abnormalities. 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 TSH was normal and in her records state that she had elevated catecholamines but there is no on evidence of that. The patient states that  she has palpitations that happen several times a day. Are associated with shortness of breath. The patient has been noticing progressively worsening shortness of breath but no chest pain. No syncope, orthopnea or paroxysmal nocturnal dyspnea. She has also noticed that her heart rate has been elevated for most part of the day. She has never smoked. Her blood pressure is a very low at baseline.  01/09/2015 - the patient is coming after 3 weeks, she was started on metoprolol 25 mg by mouth daily. Her Holter monitor short inappropriate sinus tachycardia with persistent sinus tachycardia for 20 hours during the day. She also had 4000 PVCs on her Holter monitor in 24 hours. She is coming today she states that she feels tired more dizzy and in fact she fell and has a significant bruise on her forehead. She is also experiencing palpitations with dyspnea on exertion, hyperventilation on multiple occasions. She continues to take Xarelto and is compliant with it.  Home Medications  Prior to Admission medications   Medication Sig Start Date End Date Taking? Authorizing Provider  ALPRAZolam (XANAX) 0.25 MG tablet Take 0.25 mg by mouth daily.    Yes Historical Provider, MD  CARAFATE 1 GM/10ML suspension Take 1 mL by mouth daily. 11/17/14  Yes Historical Provider, MD  clobetasol ointment (TEMOVATE) 0.05 % Apply 6.38 application topically as needed. itching 10/09/14  Yes Historical Provider, MD  Desvenlafaxine Succinate ER (PRISTIQ) 25 MG TB24 Take 1  tablet by mouth daily.   Yes Historical Provider, MD  escitalopram (LEXAPRO) 10 MG tablet Take 10 mg by mouth daily. 11/24/14  Yes Historical Provider, MD  ondansetron (ZOFRAN) 4 MG tablet Take 1 tablet (4 mg total) by mouth every 8 (eight) hours as needed for nausea or vomiting. 10/03/14  Yes Inda Castle, MD  ondansetron (ZOFRAN-ODT) 4 MG disintegrating tablet Take 4 mg by mouth as needed. vomiting 11/17/14  Yes Historical Provider, MD  pantoprazole (PROTONIX) 40 MG  tablet Take 1 tablet (40 mg total) by mouth daily. 11/09/14  Yes Nishant Dhungel, MD  promethazine (PHENERGAN) 25 MG suppository Place 1 suppository (25 mg total) rectally every 6 (six) hours as needed for nausea or vomiting. 11/20/14  Yes Ernestina Patches, MD  promethazine (PHENERGAN) 25 MG tablet Take 25 mg by mouth as needed. nausea 12/09/14  Yes Historical Provider, MD  rivaroxaban (XARELTO) 20 MG TABS tablet Take 20 mg by mouth daily with supper.   Yes Historical Provider, MD    Family History  Family History  Problem Relation Age of Onset  . Heart disease Father   . Heart attack Father   . Colon cancer Neg Hx   . Lung cancer Maternal Grandfather   . Deep vein thrombosis Daughter     Social History  History   Social History  . Marital Status: Married    Spouse Name: N/A    Number of Children: 2  . Years of Education: N/A   Occupational History  . Unemployed    Social History Main Topics  . Smoking status: Former Smoker -- 1.50 packs/day for 38 years    Types: Cigarettes    Quit date: 12/08/1998  . Smokeless tobacco: Never Used  . Alcohol Use: 27.6 oz/week    32 Shots of liquor, 14 Not specified per week     Comment: 11/08/2014 "2-3, 1 1/2shot drinks/night"  . Drug Use: No  . Sexual Activity: Yes    Birth Control/ Protection: None   Other Topics Concern  . Not on file   Social History Narrative     Review of Systems, as per HPI, otherwise negative General:  No chills, fever, night sweats or weight changes.  Cardiovascular:  No chest pain, dyspnea on exertion, edema, orthopnea, palpitations, paroxysmal nocturnal dyspnea. Dermatological: No rash, lesions/masses Respiratory: No cough, dyspnea Urologic: No hematuria, dysuria Abdominal:   No nausea, vomiting, diarrhea, bright red blood per rectum, melena, or hematemesis Neurologic:  No visual changes, wkns, changes in mental status. All other systems reviewed and are otherwise negative except as noted  above.  Physical Exam  Blood pressure 90/60, pulse 102, height 5' 1.5" (1.562 m), weight 155 lb (70.308 kg), SpO2 95 %.  General: Pleasant, NAD Psych: Normal affect. Neuro: Alert and oriented X 3. Moves all extremities spontaneously. HEENT: Normal  Neck: Supple without bruits or JVD. Lungs:  Resp regular and unlabored, CTA. Heart: RRR no s3, s4, or murmurs. Abdomen: Soft, non-tender, non-distended, BS + x 4.  Extremities: No clubbing, cyanosis or edema. DP/PT very weak, Radials 2+ and equal bilaterally.  Labs:  No results for input(s): CKTOTAL, CKMB, TROPONINI in the last 72 hours. Lab Results  Component Value Date   WBC 9.3 11/20/2014   HGB 17.7* 11/20/2014   HCT 52.0* 11/20/2014   MCV 101.4* 11/20/2014   PLT 272 11/20/2014    No results found for: DDIMER Invalid input(s): POCBNP    Component Value Date/Time   NA 139 11/20/2014 1640  NA 141 07/12/2014 1500   K 3.2* 11/20/2014 1640   K 3.8 07/12/2014 1500   CL 102 11/20/2014 1640   CL 104 03/02/2013 1531   CO2 26 11/09/2014 0708   CO2 26 07/12/2014 1500   GLUCOSE 134* 11/20/2014 1640   GLUCOSE 113 07/12/2014 1500   GLUCOSE 132* 03/02/2013 1531   BUN 8 11/20/2014 1640   BUN 8.9 07/12/2014 1500   CREATININE 1.10 11/20/2014 1640   CREATININE 0.8 07/12/2014 1500   CALCIUM 7.5* 11/09/2014 0708   CALCIUM 9.0 07/12/2014 1500   PROT 6.0 11/09/2014 0708   PROT 7.1 07/12/2014 1500   ALBUMIN 2.6* 11/09/2014 0708   ALBUMIN 3.2* 07/12/2014 1500   AST 53* 11/09/2014 0708   AST 37* 07/12/2014 1500   ALT 34 11/09/2014 0708   ALT 26 07/12/2014 1500   ALKPHOS 84 11/09/2014 0708   ALKPHOS 80 07/12/2014 1500   BILITOT 1.6* 11/09/2014 0708   BILITOT 0.56 07/12/2014 1500   GFRNONAA 83* 11/09/2014 0708   GFRAA >90 11/09/2014 0708   No results found for: CHOL, HDL, LDLCALC, TRIG   TSH 3.3 Free T4 1.32  Accessory Clinical Findings  Echocardiogram - 11/2014 Left ventricle: The cavity size was normal. Systolic function  was normal. The estimated ejection fraction was in the range of 60% to 65%. Wall motion was normal; there were no regional wall motion abnormalities. Doppler parameters are consistent with abnormal left ventricular relaxation (grade 1 diastolic dysfunction).  ECG - Sinus tachycardia, rightward axis, otherwise normal  Lexiscan nuclear stress test 12/14/2014 Quantitative Gated Spect Images QGS EDV: 32 ml QGS ESV: 8 ml  Impression Exercise Capacity: Lexiscan with no exercise. BP Response: Normal blood pressure response. Clinical Symptoms: Typical symptoms with Lexiscan ECG Impression: No significant ST segment change suggestive of ischemia. Comparison with Prior Nuclear Study: No images to compare  Overall Impression: Low risk stress nuclear study with no evidence of significant ischemia identified..  LV Ejection Fraction: 75%. LV Wall Motion: NL LV Function; NL Wall Motion  Theresa Furbish, MD    Assessment & Plan  69 year old female  1. Palpitations - normal TSH, normal echo, normal catecholamines (no lab in epic),  Holter monitor showed very frequent PVCs 4024 hours and inappropriate sinus A Cardia during 20 out of 24 hours in a day. Metoprolol make patient more higher and dizzy that resulted in a fall. We will discontinue and start Corlanor 5 mg by mouth twice a day. The patient was nightly 5 week free supply and if this is efficient we will prescribe it. Part of her tachycardia might be also deconditioning and dehydration. Patient on only drinks cold should that his sugary causes more dehydration. She hasn't been exercising since her DVT and PE last summer. We will refer her to the cardiac rehabilitation. She is advised to increase her intake of free water.  2. Dyspnea on exertion - Lexiscans nuclear stress test to evaluate for ischemia.  3. Claudications - order B/L arterial Duplex for weak pulses  Follow up in 2 months  Dorothy Spark, MD,  [facility] 01/09/2015, 10:22 AM

## 2015-01-10 ENCOUNTER — Other Ambulatory Visit (HOSPITAL_COMMUNITY): Payer: Self-pay | Admitting: Cardiology

## 2015-01-10 ENCOUNTER — Ambulatory Visit: Payer: Medicare Other | Admitting: Cardiology

## 2015-01-10 DIAGNOSIS — R0989 Other specified symptoms and signs involving the circulatory and respiratory systems: Secondary | ICD-10-CM

## 2015-01-10 LAB — METANEPHRINES, PLASMA

## 2015-01-11 ENCOUNTER — Telehealth: Payer: Self-pay | Admitting: *Deleted

## 2015-01-11 NOTE — Telephone Encounter (Signed)
Contacted [employer] to initiate prio authorization on the medication Corlanor 5 mg po BID per Dr Meda Coffee.  Information given and representative to fax approval or denial for this medication within the next 24-72 hours at [phone].

## 2015-01-15 ENCOUNTER — Encounter (HOSPITAL_COMMUNITY): Payer: Medicare Other

## 2015-01-22 ENCOUNTER — Ambulatory Visit (HOSPITAL_COMMUNITY): Payer: Medicare Other | Attending: Cardiovascular Disease | Admitting: Cardiology

## 2015-01-22 DIAGNOSIS — I872 Venous insufficiency (chronic) (peripheral): Secondary | ICD-10-CM

## 2015-01-22 DIAGNOSIS — I70219 Atherosclerosis of native arteries of extremities with intermittent claudication, unspecified extremity: Secondary | ICD-10-CM | POA: Diagnosis not present

## 2015-01-22 DIAGNOSIS — I471 Supraventricular tachycardia, unspecified: Secondary | ICD-10-CM

## 2015-01-22 DIAGNOSIS — R0989 Other specified symptoms and signs involving the circulatory and respiratory systems: Secondary | ICD-10-CM

## 2015-01-22 DIAGNOSIS — I739 Peripheral vascular disease, unspecified: Secondary | ICD-10-CM

## 2015-01-22 NOTE — Progress Notes (Signed)
LEA Doppler/ABI performed 

## 2015-01-23 ENCOUNTER — Ambulatory Visit: Payer: Medicare Other | Admitting: Cardiology

## 2015-01-24 ENCOUNTER — Telehealth: Payer: Self-pay | Admitting: *Deleted

## 2015-01-24 NOTE — Telephone Encounter (Signed)
-----   Message from Melvenia Needles, NP sent at 01/04/2015 11:47 PM EST ----- Needs ov with ROB with PFT in 4 weeks   thanx

## 2015-01-24 NOTE — Telephone Encounter (Signed)
RB in office very few times in [place] spoke with patient to schedule PFT and ov (likely with TP d/t the above) Patient would like to discuss PFT with her cardiologist at her upcoming ov  She will call back to schedule PFT if cardiology clears her for this

## 2015-01-31 ENCOUNTER — Encounter: Payer: Self-pay | Admitting: Cardiology

## 2015-01-31 ENCOUNTER — Ambulatory Visit (INDEPENDENT_AMBULATORY_CARE_PROVIDER_SITE_OTHER): Payer: Medicare Other | Admitting: Cardiology

## 2015-01-31 VITALS — BP 120/68 | HR 81 | Ht 61.5 in | Wt 154.0 lb

## 2015-01-31 DIAGNOSIS — R0609 Other forms of dyspnea: Secondary | ICD-10-CM | POA: Diagnosis not present

## 2015-01-31 DIAGNOSIS — I70219 Atherosclerosis of native arteries of extremities with intermittent claudication, unspecified extremity: Secondary | ICD-10-CM

## 2015-01-31 DIAGNOSIS — I471 Supraventricular tachycardia: Secondary | ICD-10-CM

## 2015-01-31 DIAGNOSIS — R000 Tachycardia, unspecified: Secondary | ICD-10-CM

## 2015-01-31 DIAGNOSIS — R0600 Dyspnea, unspecified: Secondary | ICD-10-CM

## 2015-01-31 NOTE — Patient Instructions (Signed)
Your physician recommends that you continue on your current medications as directed. Please refer to the Current Medication list given to you today.    Your physician recommends that you schedule a follow-up appointment in: 3 MONTHS WITH DR NELSON  

## 2015-01-31 NOTE — Progress Notes (Signed)
Patient ID: MYRTA MERCER, female   DOB: 08-05-1946, 69 y.o.   MRN: [id number] Patient ID: AVY BARLETT, female   DOB: 01-16-1946, 69 y.o.   MRN: [id number]     Patient Name: Theresa [name] Date of Encounter: 01/31/2015  Primary Care Provider:   Melinda Crutch, MD Primary Cardiologist:  Dorothy Spark  Problem List   Past Medical History  Diagnosis Date  . Esophageal stricture   . HLD (hyperlipidemia)   . Dysthymic disorder   . Other and unspecified coagulation defects   . Diaphragmatic hernia without mention of obstruction or gangrene   . PAD (peripheral artery disease)   . Pulmonary embolism 06/2014    "both lungs"  . DVT (deep venous thrombosis) 06/2014    RLE  . GERD (gastroesophageal reflux disease)   . History of hiatal hernia   . Anxiety   . Malignant neoplasm of breast (female), unspecified site 2002    "right", NO BLOOD PRESSURES OR STICKS IN RIGHT ARM  . SVT (supraventricular tachycardia) 11/07/2014  . On home oxygen therapy     "2L at night and prn" (11/08/2014)  . Atherosclerosis   . Alcohol abuse   . Chronic respiratory failure   . Colitis    Past Surgical History  Procedure Laterality Date  . Appendectomy  12/1977  . Cholecystectomy  12/1977  . Breast lumpectomy Right 01/2001  . Tubal ligation  1980's  . Cardiac catheterization  1990's  . Esophagogastroduodenoscopy (egd) with esophageal dilation  X 7   Allergies  Allergies  Allergen Reactions  . Ciprofloxacin Hives    HPI  69 year old female with history of DVT and pulmonary embolism in June of last year with significant hypoxia that lead to home O2 therapy and chronic therapy with Xarelto. The patient presented to the ER on 11/16/2014 with shortness of breath abdominal pain nausea and hemoptysis. She has also been complaining of palpitations. Abdominal CT didn't show any abnormalities. 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 TSH was normal and in her records state that she had  elevated catecholamines but there is no on evidence of that. The patient states that she has palpitations that happen several times a day. Are associated with shortness of breath. The patient has been noticing progressively worsening shortness of breath but no chest pain. No syncope, orthopnea or paroxysmal nocturnal dyspnea. She has also noticed that her heart rate has been elevated for most part of the day. She has never smoked. Her blood pressure is a very low at baseline.  01/09/2015 - the patient is coming after 3 weeks, she was started on metoprolol 25 mg by mouth daily. Her Holter monitor short inappropriate sinus tachycardia with persistent sinus tachycardia for 20 hours during the day. She also had 4000 PVCs on her Holter monitor in 24 hours. She is coming today she states that she feels tired more dizzy and in fact she fell and has a significant bruise on her forehead. She is also experiencing palpitations with dyspnea on exertion, hyperventilation on multiple occasions. She continues to take Xarelto and is compliant with it.  Home Medications  Prior to Admission medications   Medication Sig Start Date End Date Taking? Authorizing Provider  ALPRAZolam (XANAX) 0.25 MG tablet Take 0.25 mg by mouth daily.    Yes Historical Provider, MD  CARAFATE 1 GM/10ML suspension Take 1 mL by mouth daily. 11/17/14  Yes Historical Provider, MD  clobetasol ointment (TEMOVATE) 0.05 % Apply 7.12 application topically as needed. itching  10/09/14  Yes Historical Provider, MD  Desvenlafaxine Succinate ER (PRISTIQ) 25 MG TB24 Take 1 tablet by mouth daily.   Yes Historical Provider, MD  escitalopram (LEXAPRO) 10 MG tablet Take 10 mg by mouth daily. 11/24/14  Yes Historical Provider, MD  ondansetron (ZOFRAN) 4 MG tablet Take 1 tablet (4 mg total) by mouth every 8 (eight) hours as needed for nausea or vomiting. 10/03/14  Yes Inda Castle, MD  ondansetron (ZOFRAN-ODT) 4 MG disintegrating tablet Take 4 mg by mouth as  needed. vomiting 11/17/14  Yes Historical Provider, MD  pantoprazole (PROTONIX) 40 MG tablet Take 1 tablet (40 mg total) by mouth daily. 11/09/14  Yes Nishant Dhungel, MD  promethazine (PHENERGAN) 25 MG suppository Place 1 suppository (25 mg total) rectally every 6 (six) hours as needed for nausea or vomiting. 11/20/14  Yes Ernestina Patches, MD  promethazine (PHENERGAN) 25 MG tablet Take 25 mg by mouth as needed. nausea 12/09/14  Yes Historical Provider, MD  rivaroxaban (XARELTO) 20 MG TABS tablet Take 20 mg by mouth daily with supper.   Yes Historical Provider, MD    Family History  Family History  Problem Relation Age of Onset  . Heart disease Father   . Heart attack Father   . Colon cancer Neg Hx   . Lung cancer Maternal Grandfather   . Deep vein thrombosis Daughter     Social History  History   Social History  . Marital Status: Married    Spouse Name: N/A  . Number of Children: 2  . Years of Education: N/A   Occupational History  . Unemployed    Social History Main Topics  . Smoking status: Former Smoker -- 1.50 packs/day for 38 years    Types: Cigarettes    Quit date: 12/08/1998  . Smokeless tobacco: Never Used  . Alcohol Use: 27.6 oz/week    32 Shots of liquor, 14 Standard drinks or equivalent per week     Comment: 11/08/2014 "2-3, 1 1/2shot drinks/night"  . Drug Use: No  . Sexual Activity: Yes    Birth Control/ Protection: None   Other Topics Concern  . Not on file   Social History Narrative     Review of Systems, as per HPI, otherwise negative General:  No chills, fever, night sweats or weight changes.  Cardiovascular:  No chest pain, dyspnea on exertion, edema, orthopnea, palpitations, paroxysmal nocturnal dyspnea. Dermatological: No rash, lesions/masses Respiratory: No cough, dyspnea Urologic: No hematuria, dysuria Abdominal:   No nausea, vomiting, diarrhea, bright red blood per rectum, melena, or hematemesis Neurologic:  No visual changes, wkns, changes in  mental status. All other systems reviewed and are otherwise negative except as noted above.  Physical Exam  Blood pressure 120/68, pulse 81, height 5' 1.5" (1.562 m), weight 154 lb (69.854 kg), SpO2 99 %.  General: Pleasant, NAD Psych: Normal affect. Neuro: Alert and oriented X 3. Moves all extremities spontaneously. HEENT: Normal  Neck: Supple without bruits or JVD. Lungs:  Resp regular and unlabored, CTA. Heart: RRR no s3, s4, or murmurs. Abdomen: Soft, non-tender, non-distended, BS + x 4.  Extremities: No clubbing, cyanosis or edema. DP/PT very weak, Radials 2+ and equal bilaterally.  Labs:  No results for input(s): CKTOTAL, CKMB, TROPONINI in the last 72 hours. Lab Results  Component Value Date   WBC 9.3 11/20/2014   HGB 17.7* 11/20/2014   HCT 52.0* 11/20/2014   MCV 101.4* 11/20/2014   PLT 272 11/20/2014    No results found for: DDIMER  Invalid input(s): POCBNP    Component Value Date/Time   NA 139 11/20/2014 1640   NA 141 07/12/2014 1500   K 3.2* 11/20/2014 1640   K 3.8 07/12/2014 1500   CL 102 11/20/2014 1640   CL 104 03/02/2013 1531   CO2 26 11/09/2014 0708   CO2 26 07/12/2014 1500   GLUCOSE 134* 11/20/2014 1640   GLUCOSE 113 07/12/2014 1500   GLUCOSE 132* 03/02/2013 1531   BUN 8 11/20/2014 1640   BUN 8.9 07/12/2014 1500   CREATININE 1.10 11/20/2014 1640   CREATININE 0.8 07/12/2014 1500   CALCIUM 7.5* 11/09/2014 0708   CALCIUM 9.0 07/12/2014 1500   PROT 6.0 11/09/2014 0708   PROT 7.1 07/12/2014 1500   ALBUMIN 2.6* 11/09/2014 0708   ALBUMIN 3.2* 07/12/2014 1500   AST 53* 11/09/2014 0708   AST 37* 07/12/2014 1500   ALT 34 11/09/2014 0708   ALT 26 07/12/2014 1500   ALKPHOS 84 11/09/2014 0708   ALKPHOS 80 07/12/2014 1500   BILITOT 1.6* 11/09/2014 0708   BILITOT 0.56 07/12/2014 1500   GFRNONAA 83* 11/09/2014 0708   GFRAA >90 11/09/2014 0708   No results found for: CHOL, HDL, LDLCALC, TRIG   TSH 3.3 Free T4 1.32  Accessory Clinical  Findings  Echocardiogram - 11/2014 Left ventricle: The cavity size was normal. Systolic function was normal. The estimated ejection fraction was in the range of 60% to 65%. Wall motion was normal; there were no regional wall motion abnormalities. Doppler parameters are consistent with abnormal left ventricular relaxation (grade 1 diastolic dysfunction).  ECG - Sinus tachycardia, rightward axis, otherwise normal  Lexiscan nuclear stress test 12/14/2014 Quantitative Gated Spect Images QGS EDV: 32 ml QGS ESV: 8 ml  Impression Exercise Capacity: Lexiscan with no exercise. BP Response: Normal blood pressure response. Clinical Symptoms: Typical symptoms with Lexiscan ECG Impression: No significant ST segment change suggestive of ischemia. Comparison with Prior Nuclear Study: No images to compare  Overall Impression: Low risk stress nuclear study with no evidence of significant ischemia identified..  LV Ejection Fraction: 75%. LV Wall Motion: NL LV Function; NL Wall Motion  Candee Furbish, MD    Assessment & Plan  68 year old female  1. Palpitations - normal TSH, normal echo, normal catecholamines (no lab in epic),  Holter monitor showed very frequent PVCs 4024 hours and inappropriate sinus tachycardia on 24 hour Holter. Metoprolol make patient more higher and dizzy that resulted in a fall. We discontinued and started Corlanor 5 mg by mouth twice a day with some improvement of symptoms.   It was approved by her insurance company. Part of her tachycardia might be also deconditioning and dehydration.  She hasn't been exercising since her DVT and PE last summer. We will refer her to the cardiac rehabilitation. She is advised to increase her intake of free water.  2. Dyspnea on exertion - Lexiscans nuclear stress test was negative for prior scar or ischemia. Primary problem might be deconditioning, she was referred to cardiac rehabilitation we will follow on that. She is  scheduled for PFT.  3. Claudications - order B/L arterial Duplex for weak pulses  Follow up in 3 months  Dorothy Spark, MD, [facility] 01/31/2015, 12:00 PM

## 2015-02-05 ENCOUNTER — Telehealth: Payer: Self-pay | Admitting: Cardiology

## 2015-02-05 NOTE — Telephone Encounter (Signed)
Called over to Cardiac Rehab to make sure they received referral and they stated that I would need to speak with Arville Go but she was out on the floor at this time. They took my number and said they would have her call me back. Called pt to let her know that I had contacted Cardiac Rehab to make sure they had received the referral but that I was waiting on a call back. Informed pt that if they did not receive it that we would send over another referral. Pt verbalized understanding and was in agreement with this plan.

## 2015-02-05 NOTE — Telephone Encounter (Signed)
New Message      Pt calling stating that she was supposed to be contacted by Cardiac rehab and has yet to hear from them and wants to know what she should do or how much longer it will take for them to get in contact with her. Please call back and advise.

## 2015-02-05 NOTE — Telephone Encounter (Signed)
Spoke with Arville Go from Cardiac Rehab. She states that pt did not have a qualifying diagnosis for cardiac rehab but she did for pulmonary rehab. Arville Go stated that she is faxing over paperwork for Pulmonary Rehab for Dr. Meda Coffee to sign. Called pt and informed her of this information. Informed pt that someone from Rehab will be calling to set up the Pulmonary Rehab once orders are received. Pt verbalized understanding and was in agreement with this plan.

## 2015-02-07 ENCOUNTER — Telehealth: Payer: Self-pay | Admitting: Emergency Medicine

## 2015-02-07 NOTE — Telephone Encounter (Signed)
Theresa [name], CMA at 01/24/2015 3:05 PM     Status: Signed       Expand All Collapse All   RB in office very few times in [place] spoke with patient to schedule PFT and ov (likely with TP d/t the above) Patient would like to discuss PFT with her cardiologist at her upcoming ov  She will call back to schedule PFT if cardiology clears her for this            Theresa [name], CMA at 01/24/2015 3:05 PM     Status: Signed       Expand All Collapse All   ----- Message from Melvenia Needles, NP sent at 01/04/2015 11:47 PM EST ----- Needs ov with ROB with PFT in 4 weeks   thanx          Spoke with pt. She called to make these appointments. PFT - 04/03/15 at 3pm. ROV - 04/03/15 at 4:15pm.

## 2015-02-09 ENCOUNTER — Telehealth (HOSPITAL_COMMUNITY): Payer: Self-pay

## 2015-02-12 ENCOUNTER — Encounter (HOSPITAL_COMMUNITY)
Admission: RE | Admit: 2015-02-12 | Discharge: 2015-02-12 | Disposition: A | Discharge status: Home / Self Care | Payer: Medicare Other | Source: Ambulatory Visit | Attending: Cardiology | Admitting: Cardiology

## 2015-02-12 NOTE — Progress Notes (Signed)
Theresa [name] 69 y.o. female Pulmonary Rehab Orientation Note Patient arrived today in Cardiac and Pulmonary Rehab for orientation to Pulmonary Rehab. She was transported from [employer] via wheel chair. She does carry portable oxygen, however she has now been told by her MD she only needs it at HS. As patient arrived to rehab, she appeared anxious. After brief conversation and relaxation techniques, patient stated her anxiety had resolved but she felt like she needed her oxygen. Sats were 95% on RA. Oxygen placed for comfort. She states she sees a psychiatrist for her anxiety. She also states most of her anxiety comes from her "dizziness". She states she is afraid to move about because of her dizziness and stated she has had three major falls in the last 2 months. Patient had to be steadied and assisted to a sitting position immediately after standing during this appointment. Orthostatic BPs taken. Lying 125/77, HR 77, sitting 91/65, HR 84, standing 85/65, HR 94. Patient stated she felt as if she was going to black out during standing portion of test. Dr. Meda Coffee notified of results. Patient's medications reviewed. No new orders taken. MD placing patients pulmonary rehab on hold and referring patient to the Boost program. Patient education on importance of eating and staying hydrated and importance of changing positions slowly. Teach back noted. Will follow up with patient in a couple of weeks to see how she is doing in the boost program. Will re-evaluate patient for pulmonary rehab once boost program completed.

## 2015-02-13 ENCOUNTER — Telehealth: Payer: Self-pay | Admitting: Cardiology

## 2015-02-13 DIAGNOSIS — R42 Dizziness and giddiness: Secondary | ICD-10-CM

## 2015-02-13 DIAGNOSIS — R2689 Other abnormalities of gait and mobility: Secondary | ICD-10-CM

## 2015-02-13 NOTE — Telephone Encounter (Signed)
New message  Pt called requests a referral for Boost rehab per porsha a nurse at pulmonary cardiac rehad. Pt states the Nurse porsha had her to call her cardiologist and ask for a referral to this program. The pt says that she was advised to have the boost program before she can be enrolled into the cardiac rehab program. Please call back to discuss

## 2015-02-13 NOTE — Telephone Encounter (Signed)
Dr Meda Coffee, unsure what I need to do for this pt in regards to referral to Boost rehab.  Can you further assist in getting this started?

## 2015-02-13 NOTE — Telephone Encounter (Signed)
Follow Up        Pt calling back to give more information in regards to previous phone call. Please fax Attn: [place] Fax number: [phone].

## 2015-02-14 NOTE — Telephone Encounter (Signed)
Please call them to clarify what Boost is and how we can order it. Thank you, KN

## 2015-02-14 NOTE — Telephone Encounter (Signed)
Referral to vestibular rehab placed in epic and [facility] will be made aware of this to have arranged for the pt.  This is for balance problem and dizziness.  Left detailed message on pts VM that our office will be contacting her back to have this referral set up.

## 2015-02-15 NOTE — Telephone Encounter (Signed)
Pt is scheduled for vestibular rehab on 02/21/15 @ 0930 and is aware of this appt.

## 2015-02-21 ENCOUNTER — Ambulatory Visit: Payer: Medicare Other | Attending: Cardiology | Admitting: Physical Therapy

## 2015-02-21 ENCOUNTER — Encounter: Payer: Self-pay | Admitting: Physical Therapy

## 2015-02-21 DIAGNOSIS — R269 Unspecified abnormalities of gait and mobility: Secondary | ICD-10-CM | POA: Diagnosis present

## 2015-02-21 DIAGNOSIS — R6889 Other general symptoms and signs: Secondary | ICD-10-CM | POA: Diagnosis not present

## 2015-02-21 DIAGNOSIS — R42 Dizziness and giddiness: Secondary | ICD-10-CM | POA: Diagnosis not present

## 2015-02-21 DIAGNOSIS — R5381 Other malaise: Secondary | ICD-10-CM | POA: Diagnosis not present

## 2015-02-22 ENCOUNTER — Encounter: Payer: Self-pay | Admitting: Physical Therapy

## 2015-02-22 NOTE — Therapy (Signed)
[facility] [address] [place], [place], [address] Phone: [phone]   Fax:  [phone]  Physical Therapy Evaluation  Patient Details  Name: Theresa [name] MRN: [id number] Date of Birth: 1946-03-01 Referring Provider:  Dorothy Spark, MD  Encounter Date: 02/21/2015      PT End of Session - 02/22/15 1747    Visit Number 1  G1   Number of Visits 17   Date for PT Re-Evaluation 04/22/15   Authorization Type Medicare   Authorization Time Period 02-21-15 - 04-22-15   PT Start Time 0932   PT Stop Time 1020   PT Time Calculation (min) 48 min      Past Medical History  Diagnosis Date  . Esophageal stricture   . HLD (hyperlipidemia)   . Dysthymic disorder   . Other and unspecified coagulation defects   . Diaphragmatic hernia without mention of obstruction or gangrene   . PAD (peripheral artery disease)   . Pulmonary embolism 06/2014    "both lungs"  . DVT (deep venous thrombosis) 06/2014    RLE  . GERD (gastroesophageal reflux disease)   . History of hiatal hernia   . Anxiety   . Malignant neoplasm of breast (female), unspecified site 2002    "right", NO BLOOD PRESSURES OR STICKS IN RIGHT ARM  . SVT (supraventricular tachycardia) 11/07/2014  . On home oxygen therapy     "2L at night and prn" (11/08/2014)  . Atherosclerosis   . Alcohol abuse   . Chronic respiratory failure   . Colitis     Past Surgical History  Procedure Laterality Date  . Appendectomy  12/1977  . Cholecystectomy  12/1977  . Breast lumpectomy Right 01/2001  . Tubal ligation  1980's  . Cardiac catheterization  1990's  . Esophagogastroduodenoscopy (egd) with esophageal dilation  X 7    There were no vitals filed for this visit.  Visit Diagnosis:  Abnormality of gait - Plan: PT plan of care cert/re-cert  Physical deconditioning - Plan: PT plan of care cert/re-cert  Decreased activity tolerance - Plan: PT plan of care  cert/re-cert  Dizziness and giddiness - Plan: PT plan of care cert/re-cert          [facility] PT Assessment - 02/22/15 0001    Assessment   Medical Diagnosis Gait abnormality;  Dizziness   Onset Date --  summer 2015   Prior Therapy None   Precautions   Precautions Fall   Balance Screen   Has the patient fallen in the past 6 months Yes   How many times? 2   Has the patient had a decrease in activity level because of a fear of falling?  Yes   Is the patient reluctant to leave their home because of a fear of falling?  Yes   Observation/Other Assessments   Focus on Therapeutic Outcomes (FOTO)  intake score 45 for physical FS primary measure   Strength   Overall Strength Within functional limits for tasks performed   Ambulation/Gait   Ambulation/Gait Yes   Ambulation/Gait Assistance 4: Min guard   Ambulation Distance (Feet) 100 Feet   Assistive device Rolling walker  pt. brought quad cane - do not recommend this device   Gait velocity 1.75   with RW   Berg Balance Test   Sit to Stand Needs minimal aid to stand or to stabilize   Standing Unsupported Able to stand 30 seconds unsupported   Sitting with Back Unsupported but Feet Supported on  Floor or Stool Able to sit safely and securely 2 minutes  stood for 1" without holding   Stand to Sit Controls descent by using hands   Transfers Able to transfer safely, definite need of hands   Standing Unsupported with Eyes Closed Needs help to keep from falling   Standing Ubsupported with Feet Together Needs help to attain position and unable to hold for 15 seconds   From Standing, Reach Forward with Outstretched Arm Can reach forward >12 cm safely (5")   From Standing Position, Pick up Object from Floor Unable to try/needs assist to keep balance   From Standing Position, Turn to Look Behind Over each Shoulder Turn sideways only but maintains balance   Turn 360 Degrees Needs assistance while turning   Standing Unsupported, Alternately Place  Feet on Step/Stool Needs assistance to keep from falling or unable to try   Standing Unsupported, One Foot in Front Needs help to step but can hold 15 seconds   Standing on One Leg Unable to try or needs assist to prevent fall   Total Score 19   Timed Up and Go Test   Normal TUG (seconds) 30.38  with RW                           PT Education - 02/22/15 1745    Education provided Yes   Education Details instructed pt and husband to obtain RW (script given for MD to sign) and walk as much as possible with RW:  also instructed pt to stand unsupported for as long as able to do so for incr. standing tolerance   Person(s) Educated Patient;Spouse   Comprehension Verbalized understanding;Returned demonstration          PT Short Term Goals - 02/22/15 1754    PT SHORT TERM GOAL #1   Title Pt. will improve Berg score to >/=  28 to demo imporved standing balance.  (03-23-15)   Baseline 19/56   Time 4   Period Weeks   Status New   PT SHORT TERM GOAL #2   Title Improve TUG score to </= 25 secs to demo imrpoved functional mobility     (03-23-15)   Baseline 30.38 secs with RW   Time 4   Period Weeks   Status New   PT SHORT TERM GOAL #3   Title Incr. gait velocity to >/= 2.0 ft/sec with RW for incr. gait efficiency  (03-23-15)   Baseline 1.75 ft/sec with RW   Time 4   Period Weeks   Status New   PT SHORT TERM GOAL #4   Title Independent in HEP for balance and strengthening  (03-23-15)   Time 4   Period Weeks   Status New           PT Long Term Goals - 02/22/15 1758    PT LONG TERM GOAL #1   Title Incr. Berg balance test score to >/= 36/56 to decr. fall risk  (04-22-15)   Baseline 19/56   Time 8   Period Weeks   Status New   PT LONG TERM GOAL #2   Title Improve TUG score to </= 20 secs with RW for improved functional mobility  (04-22-15)   Baseline 30.38 secs wtih RW   Time 8   Period Weeks   Status New   PT LONG TERM GOAL #3   Title Incr. gait velocity to  >/= 2.3 ft/sec with RW for incr. gait  efficiency  (04-22-15)   Baseline 1.75 ft/sec with RW   Time 8   Period Weeks   Status New   PT LONG TERM GOAL #4   Title Increase FOTO score to >/=60/100 to demo imrpoved pt satisfaction  (04-22-15)   Baseline 45/100   Time 8   Period Weeks   Status New               Plan - 02/22/15 1749    Clinical Impression Statement Pt. presents with moderate to severe deficits in standing balance and tolerance and deconditioning; pt. is very unsteady and requires frequent seated rest breaks   Pt will benefit from skilled therapeutic intervention in order to improve on the following deficits Abnormal gait;Cardiopulmonary status limiting activity;Decreased endurance;Decreased activity tolerance;Decreased balance;Decreased knowledge of use of DME;Decreased mobility;Decreased strength;Other (comment)  light-headedness/dizziness   Rehab Potential Good   PT Frequency 3x / week  followed by 2x/week for 4 wks   PT Duration 4 weeks   PT Treatment/Interventions ADLs/Self Care Home Management;Therapeutic activities;Patient/family education;DME Instruction;Therapeutic exercise;Gait training;Balance training;Stair training;Neuromuscular re-education;Energy conservation;Functional mobility training   PT Next Visit Plan HEP for strengthening , balance;   walking program if pt. has obtained RW   PT Home Exercise Plan see above   Consulted and Agree with Plan of Care Patient;Family member/caregiver   Family Member Consulted spouse          G-Codes - February 27, 2015 1804    Functional Assessment Tool Used berg score 19/56;  TUG 30.38 secs with RW:  gait velocity 1.75 ft/sec with RW   Functional Limitation Mobility: Walking and moving around   Mobility: Walking and Moving Around Current Status [phone]) At least 60 percent but less than 80 percent impaired, limited or restricted   Mobility: Walking and Moving Around Goal Status [phone]) At least 40 percent but less than 60  percent impaired, limited or restricted       Problem List Patient Active Problem List   Diagnosis Date Noted  . Dyspnea and respiratory abnormality 01/04/2015  . Chronic nausea 12/27/2014  . Cough 12/27/2014  . Hypoxia 11/08/2014  . Dyspnea 11/08/2014  . Hematemesis 11/08/2014  . History of pulmonary embolism 11/08/2014  . Alcohol abuse 11/08/2014  . SVT (supraventricular tachycardia) 11/08/2014  . Acute pulmonary embolus 10/25/2014  . Nausea with vomiting 09/12/2014  . Chronic respiratory failure 06/17/2014  . Elevated TSH 06/17/2014  . Hypokalemia 06/17/2014  . Pulmonary embolus 06/15/2014  . Acute respiratory failure with hypoxia 06/15/2014  . Chronic venous insufficiency 10/26/2013  . Closed fracture of 5th metacarpal 05/02/2013  . Fall 05/02/2013  . 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 Sepsis secondary to pyelonphritis / gram negative rod (E. Coli) bacteremia 06/23/2012  . Leukocytosis 06/22/2012  . COLITIS 06/20/2010  . Malignant neoplasm of female breast 02/05/2009  . HYPERLIPIDEMIA 02/05/2009  . ANXIETY DEPRESSION 02/05/2009  . ESOPHAGEAL STRICTURE 02/05/2009  . GERD 02/05/2009    Alda Lea, PT 02/22/2015, 6:10 PM  [facility] [address] [place], [place], [address] Phone: [phone]   Fax:  [phone]

## 2015-02-23 ENCOUNTER — Ambulatory Visit (INDEPENDENT_AMBULATORY_CARE_PROVIDER_SITE_OTHER): Payer: Medicare Other | Admitting: Gastroenterology

## 2015-02-23 ENCOUNTER — Encounter: Payer: Self-pay | Admitting: Gastroenterology

## 2015-02-23 VITALS — BP 96/68 | HR 86 | Ht 61.6 in | Wt 155.4 lb

## 2015-02-23 DIAGNOSIS — J9611 Chronic respiratory failure with hypoxia: Secondary | ICD-10-CM

## 2015-02-23 DIAGNOSIS — I70219 Atherosclerosis of native arteries of extremities with intermittent claudication, unspecified extremity: Secondary | ICD-10-CM

## 2015-02-23 DIAGNOSIS — W19XXXD Unspecified fall, subsequent encounter: Secondary | ICD-10-CM | POA: Diagnosis not present

## 2015-02-23 NOTE — Progress Notes (Signed)
      History of Present Illness:  Ms. Fowles continues to complain of nausea although it may be slightly improved.  It is freestanding.  She was unable to do a gastric empty scan.  She never tried Reglan.  Her main problems are dyspnea on exertion and unsteadiness.  She's fallen twice.    Review of Systems: Pertinent positive and negative review of systems were noted in the above HPI section. All other review of systems were otherwise negative.    Current Medications, Allergies, Past Medical History, Past Surgical History, Family History and Social History were reviewed in [facility] record  Vital signs were reviewed in today's medical record. Physical Exam: General: Chronically ill-appearing in no acute distress Skin: anicteric Head: Normocephalic and atraumatic Eyes:  sclerae anicteric, EOMI Ears: Normal auditory acuity Mouth: No deformity or lesions Lungs: Few bibasilar rales posteriorly Heart: Regular rate and rhythm; no murmurs, rubs or bruits Abdomen: Soft,  and non distended. No masses, hepatosplenomegaly or hernias noted. Normal Bowel sounds.  There is minimal tenderness in the midepigastrium Rectal:deferred Musculoskeletal: Symmetrical with no gross deformities  Pulses:  Normal pulses noted Extremities: No clubbing, cyanosis, edema or deformities noted Neurological: Alert oriented x 4, grossly nonfocal Psychological:  Alert and cooperative. Normal mood and affect  See Assessment and Plan under Problem List

## 2015-02-23 NOTE — Patient Instructions (Signed)
Stop Protonix Stop Carafate We have given you the information on how to contact [facility]

## 2015-02-23 NOTE — Assessment & Plan Note (Signed)
The patient's chronic nausea is very likely due to polypharmacy.  She may have a component of gastroparesis.  Since she is slightly improved before starting Reglan this will not be started.  Plan to discontinue Carafate and Protonix.  While Lexapro may be contributing to symptoms I think she needs to continue this.

## 2015-02-26 ENCOUNTER — Other Ambulatory Visit (HOSPITAL_BASED_OUTPATIENT_CLINIC_OR_DEPARTMENT_OTHER): Payer: Medicare Other

## 2015-02-26 ENCOUNTER — Ambulatory Visit (HOSPITAL_COMMUNITY): Payer: Medicare Other

## 2015-02-26 DIAGNOSIS — Z853 Personal history of malignant neoplasm of breast: Secondary | ICD-10-CM | POA: Diagnosis not present

## 2015-02-26 DIAGNOSIS — C50919 Malignant neoplasm of unspecified site of unspecified female breast: Secondary | ICD-10-CM

## 2015-02-26 LAB — COMPREHENSIVE METABOLIC PANEL (CC13)
ALT: 64 U/L — AB (ref 0–55)
ANION GAP: 19 meq/L — AB (ref 3–11)
AST: 81 U/L — ABNORMAL HIGH (ref 5–34)
Albumin: 3.1 g/dL — ABNORMAL LOW (ref 3.5–5.0)
Alkaline Phosphatase: 100 U/L (ref 40–150)
BUN: 6.5 mg/dL — ABNORMAL LOW (ref 7.0–26.0)
CALCIUM: 8.8 mg/dL (ref 8.4–10.4)
CHLORIDE: 102 meq/L (ref 98–109)
CO2: 23 mEq/L (ref 22–29)
CREATININE: 0.8 mg/dL (ref 0.6–1.1)
EGFR: 71 mL/min/{1.73_m2} — ABNORMAL LOW (ref >90)
Glucose: 166 mg/dl — ABNORMAL HIGH (ref 70–140)
Potassium: 3 mEq/L — CL (ref 3.5–5.1)
Sodium: 144 mEq/L (ref 136–145)
TOTAL PROTEIN: 7 g/dL (ref 6.4–8.3)
Total Bilirubin: 0.76 mg/dL (ref 0.20–1.20)

## 2015-02-26 LAB — CBC WITH DIFFERENTIAL/PLATELET
BASO%: 0.5 % (ref 0.0–2.0)
Basophils Absolute: 0.1 10*3/uL (ref 0.0–0.1)
EOS%: 1 % (ref 0.0–7.0)
Eosinophils Absolute: 0.1 10*3/uL (ref 0.0–0.5)
HEMATOCRIT: 44.4 % (ref 34.8–46.6)
HEMOGLOBIN: 14.4 g/dL (ref 11.6–15.9)
LYMPH#: 2.4 10*3/uL (ref 0.9–3.3)
LYMPH%: 20.2 % (ref 14.0–49.7)
MCH: 36.1 pg — ABNORMAL HIGH (ref 25.1–34.0)
MCHC: 32.3 g/dL (ref 31.5–36.0)
MCV: 111.7 fL — AB (ref 79.5–101.0)
MONO#: 0.9 10*3/uL (ref 0.1–0.9)
MONO%: 7.7 % (ref 0.0–14.0)
NEUT%: 70.6 % (ref 38.4–76.8)
NEUTROS ABS: 8.5 10*3/uL — AB (ref 1.5–6.5)
PLATELETS: 326 10*3/uL (ref 145–400)
RBC: 3.97 10*6/uL (ref 3.70–5.45)
RDW: 19 % — ABNORMAL HIGH (ref 11.2–14.5)
WBC: 12 10*3/uL — ABNORMAL HIGH (ref 3.9–10.3)

## 2015-02-26 NOTE — Progress Notes (Signed)
Quick Note:  Call patient with the result and order K Dur 20 meq po qd X 10 days ______ 

## 2015-02-27 ENCOUNTER — Other Ambulatory Visit: Payer: Self-pay | Admitting: Medical Oncology

## 2015-02-27 ENCOUNTER — Telehealth: Payer: Self-pay | Admitting: Medical Oncology

## 2015-02-27 DIAGNOSIS — E876 Hypokalemia: Secondary | ICD-10-CM

## 2015-02-27 LAB — CANCER ANTIGEN 27.29: CA 27.29: 48 U/mL — ABNORMAL HIGH (ref 0–39)

## 2015-02-27 MED ORDER — POTASSIUM CHLORIDE CRYS ER 20 MEQ PO TBCR: 20.0000 meq | EXTENDED_RELEASE_TABLET | Freq: Every day | ORAL | Status: DC

## 2015-02-27 NOTE — Telephone Encounter (Signed)
-----   Message from Curt Bears, MD sent at 02/26/2015  5:25 PM EDT ----- Call patient with the result and order K Dur 20 meq po qd X 10 days.

## 2015-02-27 NOTE — Telephone Encounter (Signed)
Pt stated she cannot swallow kdur tabs due to narrow esophagus. I called [facility] to change to liquid equivalent and pharmacist will switch it to liquid.

## 2015-02-28 ENCOUNTER — Telehealth: Payer: Self-pay | Admitting: Internal Medicine

## 2015-02-28 ENCOUNTER — Encounter: Payer: Self-pay | Admitting: Internal Medicine

## 2015-02-28 ENCOUNTER — Ambulatory Visit (HOSPITAL_BASED_OUTPATIENT_CLINIC_OR_DEPARTMENT_OTHER): Payer: Medicare Other | Admitting: Internal Medicine

## 2015-02-28 ENCOUNTER — Telehealth: Payer: Self-pay | Admitting: Emergency Medicine

## 2015-02-28 VITALS — BP 107/56 | HR 91 | Resp 20 | Ht 61.5 in | Wt 155.2 lb

## 2015-02-28 DIAGNOSIS — R0602 Shortness of breath: Secondary | ICD-10-CM

## 2015-02-28 DIAGNOSIS — I2699 Other pulmonary embolism without acute cor pulmonale: Secondary | ICD-10-CM

## 2015-02-28 DIAGNOSIS — Z853 Personal history of malignant neoplasm of breast: Secondary | ICD-10-CM

## 2015-02-28 DIAGNOSIS — J449 Chronic obstructive pulmonary disease, unspecified: Secondary | ICD-10-CM | POA: Diagnosis not present

## 2015-02-28 DIAGNOSIS — C50911 Malignant neoplasm of unspecified site of right female breast: Secondary | ICD-10-CM

## 2015-02-28 MED ORDER — METHYLPREDNISOLONE (PAK) 4 MG PO TABS: ORAL_TABLET | ORAL | Status: DC

## 2015-02-28 NOTE — Telephone Encounter (Signed)
Gave avs & calendar for September °

## 2015-02-28 NOTE — Progress Notes (Signed)
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  2) bilateral pulmonary embolism with right deep venous thrombosis.  PRIOR THERAPY:  1. Status post right breast lumpectomy with sentinel lymph node biopsies on January 08, 2001, revealing a 1.2 cm infiltrating ductal carcinoma. The tumor was positive for estrogen and progesterone receptors and negative for HER-2/neu.  2. Status post postoperative radiotherapy to the right breast.  3. Arimidex 1 mg p.o. daily, started May 2002, completed 2012.  CURRENT THERAPY: Xarelto 20 mg by mouth once a day.   INTERVAL HISTORY: Theresa [name] 69 y.o. female returns to the clinic today for followup visit accompanied by her husband. The patient continues to complain of shortness of breath at baseline and increased with exertion. She has been doing fine recently but today her shortness of breath has increased. She denied having any significant chest pain but has shortness of breath with exertion with no cough or hemoptysis. The patient denied having any bleeding issues. She is tolerating her treatment with Xarelto well. She had repeat CBC, comprehensive metabolic panel and CA 03.49 performed recently and she is here for evaluation and discussion of her lab results.  MEDICAL HISTORY: Past Medical History  Diagnosis Date  . Esophageal stricture   . HLD (hyperlipidemia)   . Dysthymic disorder   . Other and unspecified coagulation defects   . Diaphragmatic hernia without mention of obstruction or gangrene   . PAD (peripheral artery disease)   . Pulmonary embolism 06/2014    "both lungs"  . DVT (deep venous thrombosis) 06/2014    RLE  . GERD (gastroesophageal reflux disease)   . History of hiatal hernia   . Anxiety   . Malignant  neoplasm of breast (female), unspecified site 2002    "right", NO BLOOD PRESSURES OR STICKS IN RIGHT ARM  . SVT (supraventricular tachycardia) 11/07/2014  . On home oxygen therapy     "2L at night and prn" (11/08/2014)  . Atherosclerosis   . Alcohol abuse   . Chronic respiratory failure   . Colitis     ALLERGIES:  is allergic to ciprofloxacin.  MEDICATIONS:  Current Outpatient Prescriptions  Medication Sig Dispense Refill  . ALPRAZolam (XANAX) 0.25 MG tablet Take 0.25 mg by mouth daily.     . clobetasol ointment (TEMOVATE) 0.05 % Apply 1.79 application topically as needed. itching  1  . escitalopram (LEXAPRO) 10 MG tablet Take 10 mg by mouth daily.  0  . ivabradine (CORLANOR) 5 MG TABS tablet Take 1 tablet (5 mg total) by mouth 2 (two) times daily with a meal. 120 tablet 3  . ondansetron (ZOFRAN-ODT) 4 MG disintegrating tablet Take 4 mg by mouth as needed. vomiting  0  . OXYGEN Inhale 2 L/min into the lungs at bedtime.    . potassium chloride SA (K-DUR,KLOR-CON) 20 MEQ tablet Take 1 tablet (20 mEq total) by mouth daily. 10 tablet 0  . promethazine (PHENERGAN) 25 MG tablet Take 25 mg by mouth as needed. nausea  0  . rivaroxaban (XARELTO) 20 MG TABS tablet Take 20 mg by mouth daily with supper.    . metoprolol tartrate (LOPRESSOR) 25 MG tablet     . [DISCONTINUED] lansoprazole (PREVACID) 30 MG capsule Take 30 mg by mouth daily.  No current facility-administered medications for this visit.    SURGICAL HISTORY:  Past Surgical History  Procedure Laterality Date  . Appendectomy  12/1977  . Cholecystectomy  12/1977  . Breast lumpectomy Right 01/2001  . Tubal ligation  1980's  . Cardiac catheterization  1990's  . Esophagogastroduodenoscopy (egd) with esophageal dilation  X 7    REVIEW OF SYSTEMS:  A comprehensive review of systems was negative except for: Respiratory: positive for dyspnea on exertion   PHYSICAL EXAMINATION: General appearance: alert, cooperative and no  distress Head: Normocephalic, without obvious abnormality, atraumatic Neck: no adenopathy, no JVD, supple, symmetrical, trachea midline and thyroid not enlarged, symmetric, no tenderness/mass/nodules Lymph nodes: Cervical, supraclavicular, and axillary nodes normal. Resp: clear to auscultation bilaterally Back: symmetric, no curvature. ROM normal. No CVA tenderness. Cardio: regular rate and rhythm, S1, S2 normal, no murmur, click, rub or gallop GI: soft, non-tender; bowel sounds normal; no masses,  no organomegaly Extremities: edema 1+ right lower extremity Neurologic: Alert and oriented X 3, normal strength and tone. Normal symmetric reflexes. Normal coordination and gait   ECOG PERFORMANCE STATUS: 0 - Asymptomatic  Blood pressure 107/56, pulse 91, resp. rate 20, height 5' 1.5" (1.562 m), weight 155 lb 3.2 oz (70.398 kg), SpO2 100 %.  LABORATORY DATA: Lab Results  Component Value Date   WBC 12.0* 02/26/2015   HGB 14.4 02/26/2015   HCT 44.4 02/26/2015   MCV 111.7* 02/26/2015   PLT 326 02/26/2015      Chemistry      Component Value Date/Time   NA 144 02/26/2015 1356   NA 139 11/20/2014 1640   K 3.0* 02/26/2015 1356   K 3.2* 11/20/2014 1640   CL 102 11/20/2014 1640   CL 104 03/02/2013 1531   CO2 23 02/26/2015 1356   CO2 26 11/09/2014 0708   BUN 6.5* 02/26/2015 1356   BUN 8 11/20/2014 1640   CREATININE 0.8 02/26/2015 1356   CREATININE 1.10 11/20/2014 1640      Component Value Date/Time   CALCIUM 8.8 02/26/2015 1356   CALCIUM 7.5* 11/09/2014 0708   ALKPHOS 100 02/26/2015 1356   ALKPHOS 84 11/09/2014 0708   AST 81* 02/26/2015 1356   AST 53* 11/09/2014 0708   ALT 64* 02/26/2015 1356   ALT 34 11/09/2014 0708   BILITOT 0.76 02/26/2015 1356   BILITOT 1.6* 11/09/2014 0708     CA 27.29 was 48.  RADIOGRAPHIC STUDIES:  ASSESSMENT AND PLAN: This is a very pleasant 69 years old white female with:   1) History of stage I right breast infiltrating ductal carcinoma status  post surgical resection followed by postoperative radiotherapy and completion of 10 years of hormonal therapy with Arimidex.  The patient is currently on observation with no evidence for disease recurrence on the recent blood. She will come back for follow-up visit in 6 months.   2) Acute venous thrombosis with pulmonary embolus:  She will continue treatment with Xarelto.  3) shortness of breath: Most likely secondary to COPD and recent history of pulmonary embolism. She will continue on Xarelto and I started the patient on Medrol Dosepak. I also recommended for the patient to see her pulmonologist for evaluation.  She was advised to call immediately if she has any concerning symptoms in the interval.  All questions were answered. The patient knows to call the clinic with any problems, questions or concerns. We can certainly see the patient much sooner if necessary.  Disclaimer: This note was dictated with voice recognition software. Similar  sounding words can inadvertently be transcribed and may not be corrected upon review.

## 2015-02-28 NOTE — Telephone Encounter (Signed)
Pt's ROV has been moved up to tomorrow at 9am due to her increased SOB. PFT will be done at later date.

## 2015-03-01 ENCOUNTER — Encounter: Payer: Self-pay | Admitting: Adult Health

## 2015-03-01 ENCOUNTER — Telehealth: Payer: Self-pay | Admitting: Adult Health

## 2015-03-01 ENCOUNTER — Ambulatory Visit: Payer: Medicare Other

## 2015-03-01 ENCOUNTER — Telehealth: Payer: Self-pay | Admitting: Cardiology

## 2015-03-01 ENCOUNTER — Ambulatory Visit (INDEPENDENT_AMBULATORY_CARE_PROVIDER_SITE_OTHER): Payer: Medicare Other | Admitting: Adult Health

## 2015-03-01 VITALS — BP 90/70 | HR 61 | Temp 97.5°F | Ht 61.0 in | Wt 156.0 lb

## 2015-03-01 VITALS — BP 98/66 | HR 100 | Resp 27

## 2015-03-01 DIAGNOSIS — F1010 Alcohol abuse, uncomplicated: Secondary | ICD-10-CM

## 2015-03-01 DIAGNOSIS — R269 Unspecified abnormalities of gait and mobility: Secondary | ICD-10-CM | POA: Diagnosis not present

## 2015-03-01 DIAGNOSIS — R0600 Dyspnea, unspecified: Secondary | ICD-10-CM | POA: Diagnosis not present

## 2015-03-01 DIAGNOSIS — I2699 Other pulmonary embolism without acute cor pulmonale: Secondary | ICD-10-CM

## 2015-03-01 DIAGNOSIS — R0902 Hypoxemia: Secondary | ICD-10-CM | POA: Diagnosis not present

## 2015-03-01 DIAGNOSIS — I70219 Atherosclerosis of native arteries of extremities with intermittent claudication, unspecified extremity: Secondary | ICD-10-CM

## 2015-03-01 DIAGNOSIS — I471 Supraventricular tachycardia: Secondary | ICD-10-CM

## 2015-03-01 DIAGNOSIS — R6889 Other general symptoms and signs: Secondary | ICD-10-CM

## 2015-03-01 DIAGNOSIS — R5381 Other malaise: Secondary | ICD-10-CM

## 2015-03-01 DIAGNOSIS — R42 Dizziness and giddiness: Secondary | ICD-10-CM

## 2015-03-01 MED ORDER — IVABRADINE HCL 5 MG PO TABS: 2.5000 mg | ORAL_TABLET | Freq: Two times a day (BID) | ORAL | Status: DC

## 2015-03-01 NOTE — Progress Notes (Signed)
Subjective:    Patient ID: Theresa [name], female    DOB: 1946/04/17, 69 y.o.   MRN: [id number]  HPI 70 yo former smoker, hx of Stage 1 breast CA ('02) and a recently dx R LE DVT + PE in 06/2014. She was started on xarelto for therapeutic anticoagulation.  She underwent a hypercoag panel in the setting of her clot and rx, low Prot C and ATIII levels (significance unclear).   01/04/15  Acute OV  Pt presents for an acute office visit.  Was seen by GI today for ongoing evaluation for nausea.  She was referred to our office for work in visit due to dyspnea. She is accompanied by her husband who says she gets these episodes where she gets very winded and starts breathing very fast. She is somewhat anxious and hyperventilating. O2 sats 100% on arrival on her chroinc O2 at 2l/m . HR reg at 95. EKG showed NSR , nonspecific changes. No sign change from last EKG . Recent stress test 12/15/14 was nml, w/ no nml EF and no ischemia noted.  She has been having these episodes on/off. Had repeat CT angio chest 11/08/14 taht showed no acute findings, prev. Sequela of prior thromboembolic dz, no recurrent acute PE.  She has been under a lot of stress lately and has had several workup for nausea and tachycardia.  Seen by endocrinology currently undergoing workup for adrenal insufficiency . Was r/out for pheochromocytoma.  She says she is compliant with her Xarelto.  No chest pain, exertional chest pain, calf pain. No hemoptysis .  She does admit to drinking ETOH daily w/ more than moderate use. It does seem to be an issue with daily living as she fell few days ago with bruising to scalp/forehead.  Denies HA, visual change, ext weakness or confusion.  We talked about cutting back and quitting, advised of dangers of etoh and her meds plus falls on Xarelto .   After a while of talking her breathing returned to normal and hyperventilation resolved. Says she is talking with a pyschiatrist .    03/01/2015 Acute OV    Returns for persistent symptoms of DOE.  She is not using her O2 as much , only as needed.  Today in office O2 sats 95% on RA walking.  She does need 2 person assit with cane to walk as she has significant weakness and dizziness.  B/p low today in office 90/70.  She has underwent an extensive workup for multiple somatic complaints with PCP, cardiology , pulmonary, GI and Pschy following pt.  Recent stress test 12/15/14 was nml, w/ no nml EF and no ischemia noted.   Had repeat CT angio chest 11/08/14 taht showed no acute findings, prev. Sequela of prior thromboembolic dz, no recurrent acute PE.  She was referred to cardiology last ov , with an extensive evaluation .  She was changed off metoprolol d/t hypotension to Corlanor (for tachycardia)  Holter showed Sinus Tachycardia , PVC (4000 in 24hr)  Prev echo showed nml EF, gr 1 DD , nml PAP , nml RV   She is a former smoker was recommended for PFT last ov ,  She scheduled them later than requested, we discussed the importance of this test, They are set up for next month with follow up ov with Dr. Albertine Patricia does not drink fluids.as she should, little water intake.  Still drinks heavy etoh daily . We discussed cessation.   She was referred to cardiopulm  rehab, insurance would not cover, sent to pulm rehab but says she was too weak. Sent to PT rehab OP . She has first ov today.         Review of Systems  Constitutional: Negative for fever and unexpected weight change.  HENT: Negative for congestion, dental problem, ear pain, nosebleeds, postnasal drip, rhinorrhea, sinus pressure, sneezing, sore throat and trouble swallowing.   Eyes: Negative for redness and itching.  Respiratory: Positive for shortness of breath. Negative for cough, chest tightness and wheezing.   Cardiovascular: Negative for palpitations and leg swelling.  Gastrointestinal: Negative for nausea and vomiting.   Genitourinary: Negative for dysuria.   Skin: Negative for  rash.  Neurological: Negative for headaches.  Hematological: Does not bruise/bleed easily.  Psychiatric/Behavioral: Positive for dysphoric mood. The patient is nervous/anxious.           Objective:   Physical Exam  Gen: Pleasant,obese , anxious-but less than last ov  ENT: No lesions,  mouth clear,  oropharynx clear, no postnasal drip  Neck: No JVD, no TMG, no carotid bruits  Lungs: No use of accessory muscles, no dullness to percussion, clear without rales or rhonchi,    Cardiovascular: RRR, heart sounds normal, no murmur or gallops, tr peripheral edema  Musculoskeletal: No deformities, no cyanosis or clubbing  Neuro: alert, non focal  Skin: Warm, no lesions or rashes    CXR 01/04/15  FINDINGS: There is scarring in the left base. There is no edema or consolidation. The heart size and pulmonary vascularity are within normal limits. No adenopathy. No bone lesions.  IMPRESSION: Scarring left base. No edema or consolidation.        Assessment & Plan:

## 2015-03-01 NOTE — Telephone Encounter (Signed)
Pt seen by Rexene Edison NP today for dyspnea She is having persistent low BP's, dizziness that is worse with standing since starting the Corlanor 5mg  BID  Forwarding message to pt's cardiologist Dr Meda Coffee per TP's request.

## 2015-03-01 NOTE — Therapy (Signed)
[facility] [address] [place], [place], [address] Phone: [phone]   Fax:  [phone]  Physical Therapy Treatment  Patient Details  Name: Theresa [name] MRN: [id number] Date of Birth: [birth year] Referring Provider:  Lona Kettle, MD  Encounter Date: 03/01/2015      PT End of Session - 03/01/15 1436    Visit Number 2   Number of Visits 17   Date for PT Re-Evaluation 04/22/15   Authorization Type Medicare   Authorization Time Period 02-21-15 - 04-22-15   PT Start Time 1317   PT Stop Time 1405   PT Time Calculation (min) 48 min   Activity Tolerance Treatment limited secondary to medical complications (Comment)  orthostatic hypotension   Behavior During Therapy [facility] for tasks assessed/performed      Past Medical History  Diagnosis Date  . Esophageal stricture   . HLD (hyperlipidemia)   . Dysthymic disorder   . Other and unspecified coagulation defects   . Diaphragmatic hernia without mention of obstruction or gangrene   . PAD (peripheral artery disease)   . Pulmonary embolism 06/2014    "both lungs"  . DVT (deep venous thrombosis) 06/2014    RLE  . GERD (gastroesophageal reflux disease)   . History of hiatal hernia   . Anxiety   . Malignant neoplasm of breast (female), unspecified site 2002    "right", NO BLOOD PRESSURES OR STICKS IN RIGHT ARM  . SVT (supraventricular tachycardia) 11/07/2014  . On home oxygen therapy     "2L at night and prn" (11/08/2014)  . Atherosclerosis   . Alcohol abuse   . Chronic respiratory failure   . Colitis     Past Surgical History  Procedure Laterality Date  . Appendectomy  12/1977  . Cholecystectomy  12/1977  . Breast lumpectomy Right 01/2001  . Tubal ligation  1980's  . Cardiac catheterization  1990's  . Esophagogastroduodenoscopy (egd) with esophageal dilation  X 7    Filed Vitals:   03/01/15 1328  BP: 98/66  Pulse: 100  Resp: 27  SpO2: 99%    Visit  Diagnosis:  Abnormality of gait  Physical deconditioning  Decreased activity tolerance  Dizziness and giddiness      Subjective Assessment - 03/01/15 1324    Symptoms Saw pulmonologist this morning due to while at oncologist they felt like I was really breathing heavy.  They said my blood pressure was low.  Also had blood taken and potassium was low.    Feel weak and out of breath and shaky.   Currently in Pain? No/denies      Orthostatic vitals:  Supine 122/70 HR 86 Siting 82/60  HR 94 Standing: unable to obtain due to patient shaky (taking manually due to auto cuff reads low on pt.)                   [facility] Adult PT Treatment/Exercise - 03/01/15 1431    Ambulation/Gait   Ambulation/Gait Yes   Ambulation/Gait Assistance 5: Supervision;4: Min guard   Ambulation/Gait Assistance Details for safety due to weakness, shakiness evident   Ambulation Distance (Feet) 100 Feet   Assistive device Rolling walker  adjusted walker higher for proper fit   Gait Pattern Step-through pattern;Trunk flexed;Narrow base of support;Shuffle   Ambulation Surface Level;Indoor   Gait Comments noted ankle foot pronation/inversion; educated patient and spouse that with neuropathy needs to have shoes to support her arch to prevent further ankle/foot collapse   Exercises  Exercises Knee/Hip;Ankle   Knee/Hip Exercises: Supine   Short Arc Quad Sets 2 sets;Strengthening;Both;10 reps   Short Arc Quad Sets Limitations with 2# weights   Bridges 2 sets;10 reps;Strengthening;Both   Straight Leg Raises Strengthening;Both;10 reps   Other Supine Knee Exercises hooklying hip abduction with red band around knees x 2 x 10   Ankle Exercises: Supine   T-Band plantarflexion with red t-band x 15                 PT Education - 03/01/15 1435    Education provided Yes   Education Details HEP, inserts or shoes to support arch, hydration, BP issues   Person(s) Educated Patient;Spouse   Methods  Explanation;Demonstration;Handout   Comprehension Need further instruction          PT Short Term Goals - 03/01/15 1438    PT SHORT TERM GOAL #1   Title Pt. will improve Berg score to >/=  28 to demo imporved standing balance.  (03-23-15)   Status On-going   PT SHORT TERM GOAL #2   Title Improve TUG score to </= 25 secs to demo imrpoved functional mobility     (03-23-15)   Status On-going   PT SHORT TERM GOAL #3   Title Incr. gait velocity to >/= 2.0 ft/sec with RW for incr. gait efficiency  (03-23-15)   Status On-going   PT SHORT TERM GOAL #4   Title Independent in HEP for balance and strengthening  (03-23-15)   Status On-going           PT Long Term Goals - 03/01/15 1439    PT LONG TERM GOAL #1   Title Incr. Berg balance test score to >/= 36/56 to decr. fall risk  (04-22-15)   Status On-going   PT LONG TERM GOAL #2   Title Improve TUG score to </= 20 secs with RW for improved functional mobility  (04-22-15)   Status On-going   PT LONG TERM GOAL #3   Title Incr. gait velocity to >/= 2.3 ft/sec with RW for incr. gait efficiency  (04-22-15)   Status On-going   PT LONG TERM GOAL #4   Title Increase FOTO score to >/=60/100 to demo imrpoved pt satisfaction  (04-22-15)   Status On-going               Plan - 03/01/15 1436    Clinical Impression Statement Patient with orthostatic hypotension limiting treatment to supine position today.  Patient and spouse educated on importance of hydration.  Will continue as tolerated to progress to goals.   Pt will benefit from skilled therapeutic intervention in order to improve on the following deficits Abnormal gait;Cardiopulmonary status limiting activity;Decreased endurance;Decreased activity tolerance;Decreased balance;Decreased knowledge of use of DME;Decreased mobility;Decreased strength;Other (comment)   Rehab Potential Good   PT Frequency 3x / week  then 2 x/week x 4 weeks   PT Duration 4 weeks   PT Treatment/Interventions  ADLs/Self Care Home Management;Therapeutic activities;Patient/family education;DME Instruction;Therapeutic exercise;Gait training;Balance training;Stair training;Neuromuscular re-education;Energy conservation;Functional mobility training   PT Next Visit Plan Review supine HEP, add standing if improved BP   Consulted and Agree with Plan of Care Patient;Family member/caregiver   Family Member Consulted spouse        Problem List Patient Active Problem List   Diagnosis Date Noted  . Dyspnea and respiratory abnormality 01/04/2015  . Chronic nausea 12/27/2014  . Cough 12/27/2014  . Hypoxia 11/08/2014  . Dyspnea 11/08/2014  . Hematemesis 11/08/2014  . History of  pulmonary embolism 11/08/2014  . Alcohol abuse 11/08/2014  . SVT (supraventricular tachycardia) 11/08/2014  . Acute pulmonary embolus 10/25/2014  . Nausea with vomiting 09/12/2014  . Chronic respiratory failure 06/17/2014  . Elevated TSH 06/17/2014  . Hypokalemia 06/17/2014  . Pulmonary embolus 06/15/2014  . Acute respiratory failure with hypoxia 06/15/2014  . Chronic venous insufficiency 10/26/2013  . Closed fracture of 5th metacarpal 05/02/2013  . Fall 05/02/2013  . 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 Sepsis secondary to pyelonphritis / gram negative rod (E. Coli) bacteremia 06/23/2012  . Leukocytosis 06/22/2012  . COLITIS 06/20/2010  . Malignant neoplasm of female breast 02/05/2009  . HYPERLIPIDEMIA 02/05/2009  . ANXIETY DEPRESSION 02/05/2009  . ESOPHAGEAL STRICTURE 02/05/2009  . GERD 02/05/2009    WYNN,CYNDI 03/01/2015, 2:41 PM  [facility], [facility] [address] [place], [place], [address] Phone: [phone]   Fax:  [phone]

## 2015-03-01 NOTE — Assessment & Plan Note (Signed)
Severe DOE with significant deconditioning.  She has underwent an extensive workup that shows tachycadia , severe deconditioning CT chest neg for acute PE ,  Echo w/ gr 1 DD -doubt cause of her symptoms.  No evidence of PAH so doubt CTEPH.  Although if persists could consider VQ scan to check for CTE .  Definitely needs rehab w/ PT.

## 2015-03-01 NOTE — Telephone Encounter (Signed)
New message     Patient calling  Wanted to know did Dr. Meda Coffee receive any message from [facility] today.

## 2015-03-01 NOTE — Assessment & Plan Note (Signed)
Check ONO

## 2015-03-01 NOTE — Patient Instructions (Signed)
Short Arc [employer] a large can or rolled towel under leg. Straighten knee and leg. Hold __5__ seconds. Repeat with other leg. Repeat __10__ times. Do ___2_ sessions per day.  http://gt2.exer.us/366   Copyright  VHI. All rights reserved.  Hip Abduction / Adduction: with Knee Flexion (Supine)   With both knees bent, and red band around knees pull knees apart. Repeat __10__ times per set. Do __2_ sets per session. Do __2__ sessions per day.  http://orth.exer.us/683   Copyright  VHI. All rights reserved.  Bridging   Slowly raise buttocks from floor, keeping stomach tight. Repeat __10__ times per set. Do _2___ sets per session. Do __2__ sessions per day.  http://orth.exer.us/1097   Copyright  VHI. All rights reserved.  HIP: Flexion / KNEE: Extension, Straight Leg Raise   Raise leg, keeping knee straight. Perform slowly. __10_ reps per set, _2__ sets per day, _7__ days per week   Copyright  VHI. All rights reserved.

## 2015-03-01 NOTE — Telephone Encounter (Signed)
Will route this message to Dr Meda Coffee for further review of question asked.

## 2015-03-01 NOTE — Telephone Encounter (Signed)
Instructed the pt that per Dr Meda Coffee with her complaints of low BP and dizziness since taking Corlanor, she recommends her take Corlanor 2.5 mg po bid with meals.  Advised the pt to make sure she is staying plenty hydrated as well for this helps with low BP.  Advised the pt to monitor her BP at home with her automated BP monitor, and return our office with feedback on her response to Corlanor dose change.  Pt verbalized understanding, agrees with this plan, and gracious for all the assistance provided.

## 2015-03-01 NOTE — Assessment & Plan Note (Signed)
PE /DVT dx 06/2014 with plans for at least 1 year of therapy Followed by Hematology .  Repeat CT chest 11/2014 w/ no acute PE,  Some evidence of sequela of thromboembolic dz. - ? CTE  On echo that was done with acute DOE and along with CTA -no evidence of pulm HTN so doubt CTEPH .  (echo with nml RV and nml PAP )  Oxygenation is improved at todays visit with no exertional desats, will check ONO to r/o noct desats .   Plan  Please continue your xarelto daily .  We are setting you up for an ONO at night .  Please contact office for sooner follow up if symptoms do not improve or worsen or seek emergency care  Return in 4 weeks Dr. Lamonte Sakai  With PFT as planned

## 2015-03-01 NOTE — Assessment & Plan Note (Signed)
Cessation discussed 

## 2015-03-01 NOTE — Assessment & Plan Note (Signed)
Tachycardia, intolerant to metoprolol with hypotension  Now on Corlanor - She cont to have hypotension probably exacerbated by etoh abuse /volume depletion (poor fluid intake)  Will send note and phone message to cards to see if Corlanor is aggravating hypotension and weakness.  She is a very complicated case with multiple issues to sort out. Will need close follow up .

## 2015-03-01 NOTE — Telephone Encounter (Signed)
Follow Up        Pt calling back to get clarification from Dr. Meda Coffee in regards to her visit with [facility]. Pt was notified that the message has been routed to Dr. Meda Coffee. Pt wants someone to know that she called back again to follow up.

## 2015-03-01 NOTE — Patient Instructions (Addendum)
Please continue your xarelto daily .  We are setting you up for an ONO at night .  Please contact office for sooner follow up if symptoms do not improve or worsen or seek emergency care  Return in 4 weeks Dr. Lamonte Sakai  With PFT as planned

## 2015-03-01 NOTE — Telephone Encounter (Signed)
Please instruct her to cut in half so she will take 2.5 mg po BID.

## 2015-03-03 MED ORDER — METHYLPREDNISOLONE (PAK) 4 MG PO TABS: ORAL_TABLET | ORAL | Status: DC

## 2015-03-06 ENCOUNTER — Ambulatory Visit: Payer: Medicare Other

## 2015-03-06 VITALS — BP 101/73 | HR 129

## 2015-03-06 DIAGNOSIS — R42 Dizziness and giddiness: Secondary | ICD-10-CM

## 2015-03-06 DIAGNOSIS — R5381 Other malaise: Secondary | ICD-10-CM

## 2015-03-06 DIAGNOSIS — R6889 Other general symptoms and signs: Secondary | ICD-10-CM

## 2015-03-06 DIAGNOSIS — R269 Unspecified abnormalities of gait and mobility: Secondary | ICD-10-CM | POA: Diagnosis not present

## 2015-03-06 NOTE — Therapy (Signed)
[facility] [address] [place], [place], [address] Phone: [phone]   Fax:  [phone]  Physical Therapy Treatment  Patient Details  Name: Theresa [name] MRN: [id number] Date of Birth: 02-Mar-1946 Referring Provider:  Dorothy Spark, MD  Encounter Date: 03/06/2015      PT End of Session - 03/06/15 1726    Visit Number 3   Number of Visits 17   Date for PT Re-Evaluation 04/22/15   Authorization Type Medicare   Authorization Time Period 02-21-15 - 04-22-15   PT Start Time 1315   PT Stop Time 1400   PT Time Calculation (min) 45 min      Past Medical History  Diagnosis Date  . Esophageal stricture   . HLD (hyperlipidemia)   . Dysthymic disorder   . Other and unspecified coagulation defects   . Diaphragmatic hernia without mention of obstruction or gangrene   . PAD (peripheral artery disease)   . Pulmonary embolism 06/2014    "both lungs"  . DVT (deep venous thrombosis) 06/2014    RLE  . GERD (gastroesophageal reflux disease)   . History of hiatal hernia   . Anxiety   . Malignant neoplasm of breast (female), unspecified site 2002    "right", NO BLOOD PRESSURES OR STICKS IN RIGHT ARM  . SVT (supraventricular tachycardia) 11/07/2014  . On home oxygen therapy     "2L at night and prn" (11/08/2014)  . Atherosclerosis   . Alcohol abuse   . Chronic respiratory failure   . Colitis     Past Surgical History  Procedure Laterality Date  . Appendectomy  12/1977  . Cholecystectomy  12/1977  . Breast lumpectomy Right 01/2001  . Tubal ligation  1980's  . Cardiac catheterization  1990's  . Esophagogastroduodenoscopy (egd) with esophageal dilation  X 7    Filed Vitals:   03/06/15 1325 03/06/15 1338 03/06/15 1342 03/06/15 1348  BP: 101/73     Pulse: 116 101 103 129  SpO2: 99% 93% 98% 94%    Visit Diagnosis:  Physical deconditioning  Decreased activity tolerance  Dizziness and giddiness       Subjective Assessment - 03/06/15 1320    Symptoms Pt attained a RW and entered with it today. She reported her blood pressure was better yesterday than on her first day here.         Pt limited by elevated heart rate. SpO2 was stable. Vitals were frequently checked throughout session and rest provided as needed.  Reviewed Bridging 3x10, straight leg raises 3x10, and added bilateral simultaneous isometric hip flexor exercise to HEP for core strength performed in clinic today 8x5 seconds with rest between reps.  Performed tandem walking 3x10' at counter, and retro walking 3x10' at counter. Rest required between each lap due to pt's shortness of breath but SpO2 remained stable in the 90-98% range.                         PT Short Term Goals - 03/01/15 1438    PT SHORT TERM GOAL #1   Title Pt. will improve Berg score to >/=  28 to demo imporved standing balance.  (03-23-15)   Status On-going   PT SHORT TERM GOAL #2   Title Improve TUG score to </= 25 secs to demo imrpoved functional mobility     (03-23-15)   Status On-going   PT SHORT TERM GOAL #3   Title Incr. gait velocity  to >/= 2.0 ft/sec with RW for incr. gait efficiency  (03-23-15)   Status On-going   PT SHORT TERM GOAL #4   Title Independent in HEP for balance and strengthening  (03-23-15)   Status On-going           PT Long Term Goals - 03/01/15 1439    PT LONG TERM GOAL #1   Title Incr. Berg balance test score to >/= 36/56 to decr. fall risk  (04-22-15)   Status On-going   PT LONG TERM GOAL #2   Title Improve TUG score to </= 20 secs with RW for improved functional mobility  (04-22-15)   Status On-going   PT LONG TERM GOAL #3   Title Incr. gait velocity to >/= 2.3 ft/sec with RW for incr. gait efficiency  (04-22-15)   Status On-going   PT LONG TERM GOAL #4   Title Increase FOTO score to >/=60/100 to demo imrpoved pt satisfaction  (04-22-15)   Status On-going               Plan - 03/06/15 1726     Clinical Impression Statement Pt had RW today. She was able to tolerate standing exercises today with frequent rest breaks. Continue per plan of care.   PT Next Visit Plan Add ambulation for endurance to HEP as able. Also add high marching with holds for balance HEP.        Problem List Patient Active Problem List   Diagnosis Date Noted  . Dyspnea and respiratory abnormality 01/04/2015  . Chronic nausea 12/27/2014  . Cough 12/27/2014  . Hypoxia 11/08/2014  . Dyspnea 11/08/2014  . Hematemesis 11/08/2014  . History of pulmonary embolism 11/08/2014  . Alcohol abuse 11/08/2014  . SVT (supraventricular tachycardia) 11/08/2014  . Acute pulmonary embolus 10/25/2014  . Nausea with vomiting 09/12/2014  . Chronic respiratory failure 06/17/2014  . Elevated TSH 06/17/2014  . Hypokalemia 06/17/2014  . Pulmonary embolus 06/15/2014  . Acute respiratory failure with hypoxia 06/15/2014  . Chronic venous insufficiency 10/26/2013  . Closed fracture of 5th metacarpal 05/02/2013  . Fall 05/02/2013  . 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 Sepsis secondary to pyelonphritis / gram negative rod (E. Coli) bacteremia 06/23/2012  . Leukocytosis 06/22/2012  . COLITIS 06/20/2010  . Malignant neoplasm of female breast 02/05/2009  . HYPERLIPIDEMIA 02/05/2009  . ANXIETY DEPRESSION 02/05/2009  . ESOPHAGEAL STRICTURE 02/05/2009  . GERD 02/05/2009    Delrae Sawyers, PT,DPT,NCS 03/06/2015 5:34 PM Phone [phone] FAX [phone]         [facility] [address] [place], [place], [address] Phone: [phone]   Fax:  [phone]

## 2015-03-06 NOTE — Patient Instructions (Addendum)
Minisquats: Stand with a chair right behind you holding onto your walker. Do small squats, making sure to stick out your bottom, then stand very tall. Perform 10x daily.  Feet Heel-Toe "Tandem" and backwards walking   Hold onto a counter top, walk forward making your heel touch the toe of the previous foot. Like the sobriety test. When you get to the end of the counter, walk backwards (normal not heel-toe).Copyright  VHI. All rights reserved.    Bridge [employer] small of back into mat, maintain pelvic tilt, roll up one vertebrae at a time. Focus on engaging posterior hip muscles. Hold for 1 breaths. Repeat 10 times.  Copyright  VHI. All rights reserved.    HIP: Flexion / KNEE: Extension, Straight Leg Raise   Raise leg, keeping knee straight. Perform slowly. 10 reps per set, 1-2 sets per day, 5 days per week   Copyright  VHI. All rights reserved.    Bilateral Isometric Hip Flexion   Tighten stomach and raise both knees to outstretched arms. Push gently, keeping arms straight, trunk rigid. Hold 10 seconds. *Don't hold your breath! Repeat 5 times per set. Do 1-2 sets per day.  http://orth.exer.us/1101   Copyright  VHI. All rights reserved.

## 2015-03-09 ENCOUNTER — Ambulatory Visit: Payer: Medicare Other | Attending: Cardiology

## 2015-03-09 VITALS — HR 112

## 2015-03-09 DIAGNOSIS — R269 Unspecified abnormalities of gait and mobility: Secondary | ICD-10-CM | POA: Diagnosis present

## 2015-03-09 DIAGNOSIS — R6889 Other general symptoms and signs: Secondary | ICD-10-CM

## 2015-03-09 DIAGNOSIS — R5381 Other malaise: Secondary | ICD-10-CM | POA: Diagnosis not present

## 2015-03-09 DIAGNOSIS — R42 Dizziness and giddiness: Secondary | ICD-10-CM | POA: Diagnosis not present

## 2015-03-09 NOTE — Therapy (Signed)
[facility] [address] [place], [place], [address] Phone: [phone]   Fax:  [phone]  Physical Therapy Treatment  Patient Details  Name: Theresa [name] MRN: [id number] Date of Birth: Apr 02, 1946 Referring Provider:  Dorothy Spark, MD  Encounter Date: 03/09/2015      PT End of Session - 03/09/15 1215    Visit Number 4   Number of Visits 17   Date for PT Re-Evaluation 04/22/15   Authorization Type Medicare   Authorization Time Period 02-21-15 - 04-22-15   PT Start Time 1015   PT Stop Time 1100   PT Time Calculation (min) 45 min      Past Medical History  Diagnosis Date  . Esophageal stricture   . HLD (hyperlipidemia)   . Dysthymic disorder   . Other and unspecified coagulation defects   . Diaphragmatic hernia without mention of obstruction or gangrene   . PAD (peripheral artery disease)   . Pulmonary embolism 06/2014    "both lungs"  . DVT (deep venous thrombosis) 06/2014    RLE  . GERD (gastroesophageal reflux disease)   . History of hiatal hernia   . Anxiety   . Malignant neoplasm of breast (female), unspecified site 2002    "right", NO BLOOD PRESSURES OR STICKS IN RIGHT ARM  . SVT (supraventricular tachycardia) 11/07/2014  . On home oxygen therapy     "2L at night and prn" (11/08/2014)  . Atherosclerosis   . Alcohol abuse   . Chronic respiratory failure   . Colitis     Past Surgical History  Procedure Laterality Date  . Appendectomy  12/1977  . Cholecystectomy  12/1977  . Breast lumpectomy Right 01/2001  . Tubal ligation  1980's  . Cardiac catheterization  1990's  . Esophagogastroduodenoscopy (egd) with esophageal dilation  X 7    Filed Vitals:   03/09/15 1041 03/09/15 1042  Pulse: 124 112  SpO2: 94% 93%    Visit Diagnosis:  Physical deconditioning  Decreased activity tolerance  Dizziness and giddiness  Abnormality of gait      Subjective Assessment - 03/09/15 1022    Symptoms Been having a stomach ache lately and feels she needs to see her gastroenterologist again, but plans to see PCP first. She has been experiencing abdominal distention and nausea.    Currently in Pain? No/denies     Walking for endurance with RW 1x 2 minutes 33 seconds (20 second rest after 2 minutes), after resting, walked again x2 minutes.   High knee marching at counter x3 laps with UE support with rest between each lap.  Lateral walking at counter with hands hovering 3 laps with rest between each lap  Retro walking at counter with single UE support x3 laps with rest between each lap.   Pt requires rest after every activity.                          PT Education - 03/09/15 1215    Education provided Yes   Education Details HEP walking program and high knee marching with 3 second holds   Person(s) Educated Patient   Methods Explanation;Demonstration   Comprehension Verbalized understanding;Returned demonstration          PT Short Term Goals - 03/01/15 1438    PT SHORT TERM GOAL #1   Title Pt. will improve Berg score to >/=  28 to demo imporved standing balance.  (03-23-15)   Status On-going  PT SHORT TERM GOAL #2   Title Improve TUG score to </= 25 secs to demo imrpoved functional mobility     (03-23-15)   Status On-going   PT SHORT TERM GOAL #3   Title Incr. gait velocity to >/= 2.0 ft/sec with RW for incr. gait efficiency  (03-23-15)   Status On-going   PT SHORT TERM GOAL #4   Title Independent in HEP for balance and strengthening  (03-23-15)   Status On-going           PT Long Term Goals - 03/01/15 1439    PT LONG TERM GOAL #1   Title Incr. Berg balance test score to >/= 36/56 to decr. fall risk  (04-22-15)   Status On-going   PT LONG TERM GOAL #2   Title Improve TUG score to </= 20 secs with RW for improved functional mobility  (04-22-15)   Status On-going   PT LONG TERM GOAL #3   Title Incr. gait velocity to >/= 2.3 ft/sec with RW for  incr. gait efficiency  (04-22-15)   Status On-going   PT LONG TERM GOAL #4   Title Increase FOTO score to >/=60/100 to demo imrpoved pt satisfaction  (04-22-15)   Status On-going               Plan - 03/09/15 1216    Clinical Impression Statement Pt required frequent rest breaks today. Continues to have tremors at rest, which are worse with activity. Recommended pt discuss this with PCP as it could be medication related. Continue per plan of care.   PT Next Visit Plan balance training at counter; stepping over obstacles at counter   Consulted and Agree with Plan of Care Patient        Problem List Patient Active Problem List   Diagnosis Date Noted  . Dyspnea and respiratory abnormality 01/04/2015  . Chronic nausea 12/27/2014  . Cough 12/27/2014  . Hypoxia 11/08/2014  . Dyspnea 11/08/2014  . Hematemesis 11/08/2014  . History of pulmonary embolism 11/08/2014  . Alcohol abuse 11/08/2014  . SVT (supraventricular tachycardia) 11/08/2014  . Acute pulmonary embolus 10/25/2014  . Nausea with vomiting 09/12/2014  . Chronic respiratory failure 06/17/2014  . Elevated TSH 06/17/2014  . Hypokalemia 06/17/2014  . Pulmonary embolus 06/15/2014  . Acute respiratory failure with hypoxia 06/15/2014  . Chronic venous insufficiency 10/26/2013  . Closed fracture of 5th metacarpal 05/02/2013  . Fall 05/02/2013  . 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 Sepsis secondary to pyelonphritis / gram negative rod (E. Coli) bacteremia 06/23/2012  . Leukocytosis 06/22/2012  . COLITIS 06/20/2010  . Malignant neoplasm of female breast 02/05/2009  . HYPERLIPIDEMIA 02/05/2009  . ANXIETY DEPRESSION 02/05/2009  . ESOPHAGEAL STRICTURE 02/05/2009  . GERD 02/05/2009    Delrae Sawyers, PT,DPT,NCS 03/09/2015 12:27 PM Phone [phone] FAX [phone]         [facility] [address] [place], [place], [address] Phone: [phone]   Fax:  [phone]

## 2015-03-09 NOTE — Patient Instructions (Signed)
Walking Program:  Begin walking for exercise for 2 minutes 30 seconds, 2 times/day, 6-7 days/week.   Progress your walking program by adding 1 minute to your routine each week, as tolerated. Be sure to wear good walking shoes, walk in a safe environment and only progress to your tolerance.       Marching with holds    March along counter, while holding on as needed by lifting left leg, then right. Hold each leg up for 3 seconds. Alternate. Do this along your counter top x2 laps daily. Copyright  [employer]. All rights reserved.

## 2015-03-12 ENCOUNTER — Ambulatory Visit: Payer: Medicare Other

## 2015-03-12 VITALS — BP 97/59 | HR 119

## 2015-03-12 DIAGNOSIS — R5381 Other malaise: Secondary | ICD-10-CM

## 2015-03-12 DIAGNOSIS — R6889 Other general symptoms and signs: Secondary | ICD-10-CM

## 2015-03-12 DIAGNOSIS — R42 Dizziness and giddiness: Secondary | ICD-10-CM

## 2015-03-12 DIAGNOSIS — R269 Unspecified abnormalities of gait and mobility: Secondary | ICD-10-CM

## 2015-03-12 NOTE — Patient Instructions (Signed)
Reinforced education to perform HEP daily as able for strengthening.

## 2015-03-12 NOTE — Therapy (Signed)
[facility] [address] [place], [place], [address] Phone: [phone]   Fax:  [phone]  Physical Therapy Treatment  Patient Details  Name: Theresa [name] MRN: [id number] Date of Birth: [birth year] Referring Provider:  Lona Kettle, MD  Encounter Date: 03/12/2015      PT End of Session - 03/12/15 1254    Visit Number 5   Number of Visits 17   Date for PT Re-Evaluation 04/22/15   Authorization Type Medicare   Authorization Time Period 02-21-15 - 04-22-15   PT Start Time 1200   PT Stop Time 1240   PT Time Calculation (min) 40 min   Activity Tolerance Patient limited by fatigue   Behavior During Therapy [employer] for tasks assessed/performed      Past Medical History  Diagnosis Date  . Esophageal stricture   . HLD (hyperlipidemia)   . Dysthymic disorder   . Other and unspecified coagulation defects   . Diaphragmatic hernia without mention of obstruction or gangrene   . PAD (peripheral artery disease)   . Pulmonary embolism 06/2014    "both lungs"  . DVT (deep venous thrombosis) 06/2014    RLE  . GERD (gastroesophageal reflux disease)   . History of hiatal hernia   . Anxiety   . Malignant neoplasm of breast (female), unspecified site 2002    "right", NO BLOOD PRESSURES OR STICKS IN RIGHT ARM  . SVT (supraventricular tachycardia) 11/07/2014  . On home oxygen therapy     "2L at night and prn" (11/08/2014)  . Atherosclerosis   . Alcohol abuse   . Chronic respiratory failure   . Colitis     Past Surgical History  Procedure Laterality Date  . Appendectomy  12/1977  . Cholecystectomy  12/1977  . Breast lumpectomy Right 01/2001  . Tubal ligation  1980's  . Cardiac catheterization  1990's  . Esophagogastroduodenoscopy (egd) with esophageal dilation  X 7    Filed Vitals:   03/12/15 1206  BP: 97/59  Pulse: 119  SpO2: 99%    Visit Diagnosis:  Physical deconditioning  Decreased activity  tolerance  Dizziness and giddiness  Abnormality of gait      Subjective Assessment - 03/12/15 1207    Subjective Still trying to call MD about stomach issues.  Get hold music every time.  Not doing exercises like I should because not feeling well.   Currently in Pain? No/denies                       [facility] Adult PT Treatment/Exercise - 03/12/15 1210    Ambulation/Gait   Ambulation/Gait Yes   Ambulation/Gait Assistance 5: Supervision;4: Min guard   Ambulation/Gait Assistance Details supervision with RW x 230' and curb, then with SPC x 115', and SBQC x 115' cues for sequencing, and safety due to pt reports usually using her cane [facility]) when she goes out.   Ambulation Distance (Feet) 100 Feet  x 2   Assistive device Rolling walker;Small based quad cane;Straight cane   Gait Pattern Step-through pattern;Trunk flexed;Narrow base of support;Shuffle   Ambulation Surface Level;Indoor   Curb 4: Min assist   Curb Details (indicate cue type and reason) with rolling walker, assist for balance/safety, cues for technique (up with left, down with right)   Knee/Hip Exercises: Standing   Heel Raises 1 set;10 reps  UE support   Lateral Step Up Both;5 reps;1 set;Step Height: 6";Hand Hold: 2  and min assist   Functional Squat  1 set;10 reps  UE support   Other Standing Knee Exercises hamstring curls 4# x 10 each with UE support   Other Standing Knee Exercises hip abduction 4# x 10 alternating with UE support   Knee/Hip Exercises: Seated   Long Arc Quad Strengthening;1 set;Both;Weights;15 reps   Long Arc Quad Limitations 4#             Balance Exercises - 03/12/15 1217    Balance Exercises: Standing   Standing Eyes Opened Wide (BOA);Solid surface;1 rep;Time  60 seconds; also feet together 30 seconds min guard/superv   Tandem Stance Eyes open;Upper extremity support 1;20 secs   Heel Raises Limitations heel and toe raises/rocking for limits of stability x 10 UE support   Other  Standing Exercises standing static feet apart, ball toss to self x 20 with SBA           PT Education - 03/12/15 1254    Education provided Yes   Education Details reviewed importance of HEP   Person(s) Educated Patient   Methods Explanation   Comprehension Verbalized understanding          PT Short Term Goals - 03/12/15 1258    PT SHORT TERM GOAL #1   Title Pt. will improve Berg score to >/=  28 to demo imporved standing balance.  (03-23-15)   Status On-going   PT SHORT TERM GOAL #2   Title Improve TUG score to </= 25 secs to demo imrpoved functional mobility     (03-23-15)   Status On-going   PT SHORT TERM GOAL #3   Title Incr. gait velocity to >/= 2.0 ft/sec with RW for incr. gait efficiency  (03-23-15)   Status On-going   PT SHORT TERM GOAL #4   Title Independent in HEP for balance and strengthening  (03-23-15)   Status On-going           PT Long Term Goals - 03/12/15 1258    PT LONG TERM GOAL #1   Title Incr. Berg balance test score to >/= 36/56 to decr. fall risk  (04-22-15)   Status On-going   PT LONG TERM GOAL #2   Title Improve TUG score to </= 20 secs with RW for improved functional mobility  (04-22-15)   Status On-going   PT LONG TERM GOAL #3   Title Incr. gait velocity to >/= 2.3 ft/sec with RW for incr. gait efficiency  (04-22-15)   Status On-going   PT LONG TERM GOAL #4   Title Increase FOTO score to >/=60/100 to demo imrpoved pt satisfaction  (04-22-15)   Status On-going               Plan - 03/12/15 1255    Clinical Impression Statement Patient continues to be limited by SOB requiring rest breaks frequently between activities.  No doing HEP consistently per her report.  Feel she needs continued skilled PT to progress to goals and improve activity tolerance.   Pt will benefit from skilled therapeutic intervention in order to improve on the following deficits Abnormal gait;Cardiopulmonary status limiting activity;Decreased endurance;Decreased  activity tolerance;Decreased balance;Decreased knowledge of use of DME;Decreased mobility;Decreased strength;Other (comment)   Rehab Potential Good   PT Frequency 3x / week  then 2x/week for 4 weeks   PT Treatment/Interventions ADLs/Self Care Home Management;Therapeutic activities;Patient/family education;DME Instruction;Therapeutic exercise;Gait training;Balance training;Stair training;Neuromuscular re-education;Energy conservation;Functional mobility training   PT Next Visit Plan stepping over obstacles; continue strength, balance and gait   Consulted and Agree with Plan of  Care Patient        Problem List Patient Active Problem List   Diagnosis Date Noted  . Dyspnea and respiratory abnormality 01/04/2015  . Chronic nausea 12/27/2014  . Cough 12/27/2014  . Hypoxia 11/08/2014  . Dyspnea 11/08/2014  . Hematemesis 11/08/2014  . History of pulmonary embolism 11/08/2014  . Alcohol abuse 11/08/2014  . SVT (supraventricular tachycardia) 11/08/2014  . Acute pulmonary embolus 10/25/2014  . Nausea with vomiting 09/12/2014  . Chronic respiratory failure 06/17/2014  . Elevated TSH 06/17/2014  . Hypokalemia 06/17/2014  . Pulmonary embolus 06/15/2014  . Acute respiratory failure with hypoxia 06/15/2014  . Chronic venous insufficiency 10/26/2013  . Closed fracture of 5th metacarpal 05/02/2013  . Fall 05/02/2013  . 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 Sepsis secondary to pyelonphritis / gram negative rod (E. Coli) bacteremia 06/23/2012  . Leukocytosis 06/22/2012  . COLITIS 06/20/2010  . Malignant neoplasm of female breast 02/05/2009  . HYPERLIPIDEMIA 02/05/2009  . ANXIETY DEPRESSION 02/05/2009  . ESOPHAGEAL STRICTURE 02/05/2009  . GERD 02/05/2009    Ceferino Lang,CYNDI 03/12/2015, 1:02 PM  [facility], [facility] [address] [place], [place], [address] Phone:  [phone]   Fax:  [phone]

## 2015-03-13 ENCOUNTER — Ambulatory Visit: Payer: Medicare Other | Admitting: Physical Therapy

## 2015-03-13 DIAGNOSIS — R5381 Other malaise: Secondary | ICD-10-CM

## 2015-03-13 DIAGNOSIS — R269 Unspecified abnormalities of gait and mobility: Secondary | ICD-10-CM | POA: Diagnosis not present

## 2015-03-14 ENCOUNTER — Encounter: Payer: Self-pay | Admitting: Physical Therapy

## 2015-03-14 NOTE — Therapy (Signed)
[facility] [address] [place], [place], [address] Phone: [phone]   Fax:  [phone]  Physical Therapy Treatment  Patient Details  Name: Theresa [name] MRN: [id number] Date of Birth: 03-May-1946 Referring Provider:  Lona Kettle, MD  Encounter Date: 03/13/2015      PT End of Session - 03/14/15 1130    Visit Number 6   Number of Visits 17   Date for PT Re-Evaluation 04/22/15   Authorization Type Medicare   Authorization Time Period 02-21-15 - 04-22-15   PT Start Time 1320   PT Stop Time 1400   PT Time Calculation (min) 40 min   Activity Tolerance Patient limited by fatigue   Behavior During Therapy [facility] for tasks assessed/performed      Past Medical History  Diagnosis Date  . Esophageal stricture   . HLD (hyperlipidemia)   . Dysthymic disorder   . Other and unspecified coagulation defects   . Diaphragmatic hernia without mention of obstruction or gangrene   . PAD (peripheral artery disease)   . Pulmonary embolism 06/2014    "both lungs"  . DVT (deep venous thrombosis) 06/2014    RLE  . GERD (gastroesophageal reflux disease)   . History of hiatal hernia   . Anxiety   . Malignant neoplasm of breast (female), unspecified site 2002    "right", NO BLOOD PRESSURES OR STICKS IN RIGHT ARM  . SVT (supraventricular tachycardia) 11/07/2014  . On home oxygen therapy     "2L at night and prn" (11/08/2014)  . Atherosclerosis   . Alcohol abuse   . Chronic respiratory failure   . Colitis     Past Surgical History  Procedure Laterality Date  . Appendectomy  12/1977  . Cholecystectomy  12/1977  . Breast lumpectomy Right 01/2001  . Tubal ligation  1980's  . Cardiac catheterization  1990's  . Esophagogastroduodenoscopy (egd) with esophageal dilation  X 7    There were no vitals filed for this visit.  Visit Diagnosis:  Physical deconditioning  Abnormality of gait      Subjective Assessment - 03/14/15 1126     Subjective Still has not called MD about stomach issues.  "There is so much going on.  I'll try again this afternoon."   Pertinent History PE's in bil. lungs summer of 2015; tachycardia??; orthostatic hypotension;    Patient Stated Goals improve balance so able to return to pulmonary rehab   Currently in Pain? No/denies                       [facility] Adult PT Treatment/Exercise - 03/14/15 1127    Ambulation/Gait   Ambulation/Gait Yes   Ambulation/Gait Assistance 5: Supervision;4: Min guard   Ambulation/Gait Assistance Details min guard at times due to "shakiness" visible   Ambulation Distance (Feet) 110 Feet  three times   Assistive device Rolling walker   Gait Pattern Step-through pattern;Trunk flexed;Narrow base of support;Shuffle   Ambulation Surface Level;Indoor   Knee/Hip Exercises: Aerobic   Stationary Bike Scifit level 1.5 all 4 extremities x 8 minutes for endurance      Pt's vitals as follows during treatment: BP 102/75, HR 97, SaO2 97%-upon arrival HR 115, SaO2 95%-after ambulation 110' BP 121/88, HR 106, SaO2 93%-after Scifit  Pt with dyspnea 4/4 at times during treatment and required frequent rest breaks.         PT Education - 03/14/15 1130    Education provided Yes   Education  Details Importance of HEP and increasing overall activity and independency   Person(s) Educated Patient   Methods Explanation   Comprehension Verbalized understanding          PT Short Term Goals - 03/12/15 1258    PT SHORT TERM GOAL #1   Title Pt. will improve Berg score to >/=  28 to demo imporved standing balance.  (03-23-15)   Status On-going   PT SHORT TERM GOAL #2   Title Improve TUG score to </= 25 secs to demo imrpoved functional mobility     (03-23-15)   Status On-going   PT SHORT TERM GOAL #3   Title Incr. gait velocity to >/= 2.0 ft/sec with RW for incr. gait efficiency  (03-23-15)   Status On-going   PT SHORT TERM GOAL #4   Title Independent in HEP for  balance and strengthening  (03-23-15)   Status On-going           PT Long Term Goals - 03/12/15 1258    PT LONG TERM GOAL #1   Title Incr. Berg balance test score to >/= 36/56 to decr. fall risk  (04-22-15)   Status On-going   PT LONG TERM GOAL #2   Title Improve TUG score to </= 20 secs with RW for improved functional mobility  (04-22-15)   Status On-going   PT LONG TERM GOAL #3   Title Incr. gait velocity to >/= 2.3 ft/sec with RW for incr. gait efficiency  (04-22-15)   Status On-going   PT LONG TERM GOAL #4   Title Increase FOTO score to >/=60/100 to demo imrpoved pt satisfaction  (04-22-15)   Status On-going               Plan - 03/14/15 1131    Clinical Impression Statement Pt reports decreased activity at home and allows husband to do things that she should probably try to do herself.  No doing HEP or walking consistently.  Continue PT per POC.   Pt will benefit from skilled therapeutic intervention in order to improve on the following deficits Abnormal gait;Cardiopulmonary status limiting activity;Decreased endurance;Decreased activity tolerance;Decreased balance;Decreased knowledge of use of DME;Decreased mobility;Decreased strength;Other (comment)   Rehab Potential Good   PT Frequency 2x / week   PT Duration 4 weeks   PT Treatment/Interventions ADLs/Self Care Home Management;Therapeutic activities;Patient/family education;DME Instruction;Therapeutic exercise;Gait training;Balance training;Stair training;Neuromuscular re-education;Energy conservation;Functional mobility training   PT Next Visit Plan Scifit for endurance, develop walking program, strengthening   Consulted and Agree with Plan of Care Patient   Family Member Consulted spouse        Problem List Patient Active Problem List   Diagnosis Date Noted  . Dyspnea and respiratory abnormality 01/04/2015  . Chronic nausea 12/27/2014  . Cough 12/27/2014  . Hypoxia 11/08/2014  . Dyspnea 11/08/2014  .  Hematemesis 11/08/2014  . History of pulmonary embolism 11/08/2014  . Alcohol abuse 11/08/2014  . SVT (supraventricular tachycardia) 11/08/2014  . Acute pulmonary embolus 10/25/2014  . Nausea with vomiting 09/12/2014  . Chronic respiratory failure 06/17/2014  . Elevated TSH 06/17/2014  . Hypokalemia 06/17/2014  . Pulmonary embolus 06/15/2014  . Acute respiratory failure with hypoxia 06/15/2014  . Chronic venous insufficiency 10/26/2013  . Closed fracture of 5th metacarpal 05/02/2013  . Fall 05/02/2013  . 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 Sepsis secondary to pyelonphritis / gram negative rod (E. Coli) bacteremia 06/23/2012  . Leukocytosis 06/22/2012  . COLITIS 06/20/2010  .  Malignant neoplasm of female breast 02/05/2009  . HYPERLIPIDEMIA 02/05/2009  . ANXIETY DEPRESSION 02/05/2009  . ESOPHAGEAL STRICTURE 02/05/2009  . GERD 02/05/2009    Narda Bonds 03/14/2015, 11:34 AM  [facility] [address] [place], [place], [address] Phone: [phone]   Fax:  [place], [place] 03/14/2015 11:34 AM Phone: [phone] Fax: [phone]

## 2015-03-15 ENCOUNTER — Telehealth: Payer: Self-pay | Admitting: Emergency Medicine

## 2015-03-15 ENCOUNTER — Ambulatory Visit: Payer: Medicare Other

## 2015-03-15 VITALS — BP 101/69 | HR 120

## 2015-03-15 DIAGNOSIS — R5381 Other malaise: Secondary | ICD-10-CM

## 2015-03-15 DIAGNOSIS — R6889 Other general symptoms and signs: Secondary | ICD-10-CM

## 2015-03-15 DIAGNOSIS — J9611 Chronic respiratory failure with hypoxia: Secondary | ICD-10-CM

## 2015-03-15 DIAGNOSIS — R269 Unspecified abnormalities of gait and mobility: Secondary | ICD-10-CM | POA: Diagnosis not present

## 2015-03-15 NOTE — Telephone Encounter (Signed)
ONO does show desats  Would begin O2 at At bedtime  At 2l/m  Will discuss on return , check for OSA sx , may need sleep study in future

## 2015-03-15 NOTE — Telephone Encounter (Signed)
Pt is requesting results from her ONO.  Please advise. Thanks.

## 2015-03-15 NOTE — Therapy (Signed)
[facility] [address] [place], [place], [address] Phone: [phone]   Fax:  [phone]  Physical Therapy Treatment  Patient Details  Name: Theresa [name] MRN: [id number] Date of Birth: September 25, 1946 Referring Provider:  Lona Kettle, MD  Encounter Date: 03/15/2015      PT End of Session - 03/15/15 1355    Visit Number 7   Number of Visits 17   Date for PT Re-Evaluation 04/22/15   PT Start Time 2633   PT Stop Time 1400   PT Time Calculation (min) 45 min   Equipment Utilized During Treatment Gait belt   Activity Tolerance Patient limited by fatigue  and HR up to 133 bpm   Behavior During Therapy [facility] for tasks assessed/performed      Past Medical History  Diagnosis Date  . Esophageal stricture   . HLD (hyperlipidemia)   . Dysthymic disorder   . Other and unspecified coagulation defects   . Diaphragmatic hernia without mention of obstruction or gangrene   . PAD (peripheral artery disease)   . Pulmonary embolism 06/2014    "both lungs"  . DVT (deep venous thrombosis) 06/2014    RLE  . GERD (gastroesophageal reflux disease)   . History of hiatal hernia   . Anxiety   . Malignant neoplasm of breast (female), unspecified site 2002    "right", NO BLOOD PRESSURES OR STICKS IN RIGHT ARM  . SVT (supraventricular tachycardia) 11/07/2014  . On home oxygen therapy     "2L at night and prn" (11/08/2014)  . Atherosclerosis   . Alcohol abuse   . Chronic respiratory failure   . Colitis     Past Surgical History  Procedure Laterality Date  . Appendectomy  12/1977  . Cholecystectomy  12/1977  . Breast lumpectomy Right 01/2001  . Tubal ligation  1980's  . Cardiac catheterization  1990's  . Esophagogastroduodenoscopy (egd) with esophageal dilation  X 7    Filed Vitals:   03/15/15 1326  BP: 101/69  Pulse: 120  SpO2: 93%    Visit Diagnosis:  Physical deconditioning  Abnormality of gait  Decreased activity  tolerance      Subjective Assessment - 03/15/15 1319    Subjective Stomach issues are ongoing.  Have seen gastroenterologist here and in [place].  They recommended a medication that can have bad side effects with tremors, etc.  I don't need that.     Currently in Pain? No/denies                       [facility] Adult PT Treatment/Exercise - 03/15/15 1346    Ambulation/Gait   Ambulation/Gait Yes   Ambulation/Gait Assistance 5: Supervision   Ambulation/Gait Assistance Details adjusted walker up another notch for height, supervision for safety due to fatitgues   Ambulation Distance (Feet) 230 Feet  then 115 with tripod cane min assist   Assistive device Rolling walker   Gait Pattern Step-through pattern;Trunk flexed;Narrow base of support;Shuffle   Ambulation Surface Level;Indoor   Knee/Hip Exercises: Aerobic   Stationary Bike scifit level 1.5 UE/LE's x 8 minutes   Knee/Hip Exercises: Standing   Lateral Step Up Both;1 set;5 reps;Step Height: 4";Hand Hold: 2   Forward Step Up Both;1 set;5 reps;Step Height: 6";Hand Hold: 2             Balance Exercises - 03/15/15 1339    Balance Exercises: Standing   Sidestepping 3 reps;Upper extremity support  side steps with min  assist bilat UE HHA x 3 laps   Marching Limitations Marching forwards and back one UE support x 3 laps   Other Standing Exercises standing forward steps over threshold alternating feet with tripod cane for support x 10 reps           PT Education - 03/14/15 1130    Education provided Yes   Education Details Importance of HEP and increasing overall activity and independency   Person(s) Educated Patient   Methods Explanation   Comprehension Verbalized understanding          PT Short Term Goals - 03/15/15 1400    PT SHORT TERM GOAL #1   Title Pt. will improve Berg score to >/=  28 to demo imporved standing balance.  (03-23-15)   Status On-going   PT SHORT TERM GOAL #2   Title Improve TUG  score to </= 25 secs to demo imrpoved functional mobility     (03-23-15)   Status On-going   PT SHORT TERM GOAL #3   Title Incr. gait velocity to >/= 2.0 ft/sec with RW for incr. gait efficiency  (03-23-15)   Status On-going   PT SHORT TERM GOAL #4   Title Independent in HEP for balance and strengthening  (03-23-15)   Status On-going           PT Long Term Goals - 03/15/15 1400    PT LONG TERM GOAL #1   Title Incr. Berg balance test score to >/= 36/56 to decr. fall risk  (04-22-15)   Status On-going   PT LONG TERM GOAL #2   Title Improve TUG score to </= 20 secs with RW for improved functional mobility  (04-22-15)   Status On-going   PT LONG TERM GOAL #3   Title Incr. gait velocity to >/= 2.3 ft/sec with RW for incr. gait efficiency  (04-22-15)   Status On-going   PT LONG TERM GOAL #4   Title Increase FOTO score to >/=60/100 to demo imrpoved pt satisfaction  (04-22-15)   Status On-going               Plan - 03/15/15 1356    Clinical Impression Statement Patient progressing slowly with endurance and mobility.  She reports doing "some" walking at home.  Still needs frequent rest breaks.  Increased HR today up to 133 after ambulation, but denies symptoms except SOB.  States always has elevated HR when checked at MD office.  Seems to tolerate seated aerobic exercise well.  Continue skilled PT to progress to goals.   Pt will benefit from skilled therapeutic intervention in order to improve on the following deficits Abnormal gait;Cardiopulmonary status limiting activity;Decreased endurance;Decreased activity tolerance;Decreased balance;Decreased knowledge of use of DME;Decreased mobility;Decreased strength;Other (comment)   PT Frequency 3x / week   PT Duration 4 weeks   PT Treatment/Interventions ADLs/Self Care Home Management;Therapeutic activities;Patient/family education;DME Instruction;Therapeutic exercise;Gait training;Balance training;Stair training;Neuromuscular  re-education;Energy conservation;Functional mobility training   PT Next Visit Plan walking program, continue machines for enducance, balance review HEP.   Consulted and Agree with Plan of Care Patient        Problem List Patient Active Problem List   Diagnosis Date Noted  . Dyspnea and respiratory abnormality 01/04/2015  . Chronic nausea 12/27/2014  . Cough 12/27/2014  . Hypoxia 11/08/2014  . Dyspnea 11/08/2014  . Hematemesis 11/08/2014  . History of pulmonary embolism 11/08/2014  . Alcohol abuse 11/08/2014  . SVT (supraventricular tachycardia) 11/08/2014  . Acute pulmonary embolus 10/25/2014  .  Nausea with vomiting 09/12/2014  . Chronic respiratory failure 06/17/2014  . Elevated TSH 06/17/2014  . Hypokalemia 06/17/2014  . Pulmonary embolus 06/15/2014  . Acute respiratory failure with hypoxia 06/15/2014  . Chronic venous insufficiency 10/26/2013  . Closed fracture of 5th metacarpal 05/02/2013  . Fall 05/02/2013  . 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 Sepsis secondary to pyelonphritis / gram negative rod (E. Coli) bacteremia 06/23/2012  . Leukocytosis 06/22/2012  . COLITIS 06/20/2010  . Malignant neoplasm of female breast 02/05/2009  . HYPERLIPIDEMIA 02/05/2009  . ANXIETY DEPRESSION 02/05/2009  . ESOPHAGEAL STRICTURE 02/05/2009  . GERD 02/05/2009    WYNN,CYNDI 03/15/2015, 5:19 PM  [facility], [facility] [address] [place], [place], [address] Phone: [phone]   Fax:  [phone]

## 2015-03-16 ENCOUNTER — Telehealth: Payer: Self-pay | Admitting: Emergency Medicine

## 2015-03-16 NOTE — Telephone Encounter (Signed)
Patient notified of results. Nothing further needed.  

## 2015-03-16 NOTE — Telephone Encounter (Signed)
Pt is aware of ONO results. Order has been placed for oxygen. Nothing further was needed.

## 2015-03-19 ENCOUNTER — Ambulatory Visit: Payer: Medicare Other | Admitting: Physical Therapy

## 2015-03-19 ENCOUNTER — Encounter: Payer: Self-pay | Admitting: Physical Therapy

## 2015-03-19 DIAGNOSIS — R5381 Other malaise: Secondary | ICD-10-CM

## 2015-03-19 DIAGNOSIS — R269 Unspecified abnormalities of gait and mobility: Secondary | ICD-10-CM | POA: Diagnosis not present

## 2015-03-19 NOTE — Therapy (Signed)
[facility] [address] [place], [place], [address] Phone: [phone]   Fax:  [phone]  Physical Therapy Treatment  Patient Details  Name: Theresa [name] MRN: [id number] Date of Birth: 1946-07-20 Referring Provider:  Lona Kettle, MD  Encounter Date: 03/19/2015      PT End of Session - 03/19/15 1325    Visit Number 8  G8   Number of Visits 17   Date for PT Re-Evaluation 04/22/15   Authorization Type Medicare   Authorization Time Period 02-21-15 - 04-22-15   PT Start Time 1020   PT Stop Time 1100   PT Time Calculation (min) 40 min      Past Medical History  Diagnosis Date  . Esophageal stricture   . HLD (hyperlipidemia)   . Dysthymic disorder   . Other and unspecified coagulation defects   . Diaphragmatic hernia without mention of obstruction or gangrene   . PAD (peripheral artery disease)   . Pulmonary embolism 06/2014    "both lungs"  . DVT (deep venous thrombosis) 06/2014    RLE  . GERD (gastroesophageal reflux disease)   . History of hiatal hernia   . Anxiety   . Malignant neoplasm of breast (female), unspecified site 2002    "right", NO BLOOD PRESSURES OR STICKS IN RIGHT ARM  . SVT (supraventricular tachycardia) 11/07/2014  . On home oxygen therapy     "2L at night and prn" (11/08/2014)  . Atherosclerosis   . Alcohol abuse   . Chronic respiratory failure   . Colitis     Past Surgical History  Procedure Laterality Date  . Appendectomy  12/1977  . Cholecystectomy  12/1977  . Breast lumpectomy Right 01/2001  . Tubal ligation  1980's  . Cardiac catheterization  1990's  . Esophagogastroduodenoscopy (egd) with esophageal dilation  X 7    There were no vitals filed for this visit.  Visit Diagnosis:  Abnormality of gait  Physical deconditioning      Subjective Assessment - 03/19/15 1319    Subjective Pt. states she feels that balance is a little better; using cane for assistance with  ambulation in the home; continues to become SOB with activity   Pertinent History PE's in bil. lungs summer of 2015; tachycardia??; orthostatic hypotension;    Patient Stated Goals improve balance so able to return to pulmonary rehab   Currently in Pain? No/denies                       [facility] Adult PT Treatment/Exercise - 03/19/15 1321    Transfers   Sit to Stand 5: Supervision  10 reps without UE support   Ambulation/Gait   Ambulation/Gait Yes   Ambulation/Gait Assistance 5: Supervision   Ambulation Distance (Feet) 240 Feet   Assistive device Rolling walker   Gait Pattern Step-through pattern;Trunk flexed;Narrow base of support;Shuffle   Ambulation Surface Level;Indoor   Knee/Hip Exercises: Standing   Forward Step Up Both;1 set;10 reps   Other Standing Knee Exercises hip flexion and extension with 4# weight bil. LE's x 10 reps   Other Standing Knee Exercises hip abduction 4# x 10 alternating with UE support   Ankle Exercises: Seated   Other Seated Ankle Exercises standing heel raises x 10 reps     Gait; gait trained inside bars without UE support 10' x 4 reps with S with cues to incr/. BOS  NeuroRe-ed:  rockerboard anterior/posterior with UE support prn with CGA  Single limb  stance activity - stepping over and back of 1/2 bolster with UE support as needed with SBA 10 reps each leg             PT Short Term Goals - 03/15/15 1400    PT SHORT TERM GOAL #1   Title Pt. will improve Berg score to >/=  28 to demo imporved standing balance.  (03-23-15)   Status On-going   PT SHORT TERM GOAL #2   Title Improve TUG score to </= 25 secs to demo imrpoved functional mobility     (03-23-15)   Status On-going   PT SHORT TERM GOAL #3   Title Incr. gait velocity to >/= 2.0 ft/sec with RW for incr. gait efficiency  (03-23-15)   Status On-going   PT SHORT TERM GOAL #4   Title Independent in HEP for balance and strengthening  (03-23-15)   Status On-going           PT  Long Term Goals - 03/15/15 1400    PT LONG TERM GOAL #1   Title Incr. Berg balance test score to >/= 36/56 to decr. fall risk  (04-22-15)   Status On-going   PT LONG TERM GOAL #2   Title Improve TUG score to </= 20 secs with RW for improved functional mobility  (04-22-15)   Status On-going   PT LONG TERM GOAL #3   Title Incr. gait velocity to >/= 2.3 ft/sec with RW for incr. gait efficiency  (04-22-15)   Status On-going   PT LONG TERM GOAL #4   Title Increase FOTO score to >/=60/100 to demo imrpoved pt satisfaction  (04-22-15)   Status On-going               Plan - 03/19/15 1325    Clinical Impression Statement Pt. requires frequent seated rest breaks due to SOB with activity; pt. did well with amb. in parallel bars without UE support; pt. demonstrating improved balance   Pt will benefit from skilled therapeutic intervention in order to improve on the following deficits Abnormal gait;Cardiopulmonary status limiting activity;Decreased endurance;Decreased activity tolerance;Decreased balance;Decreased knowledge of use of DME;Decreased mobility;Decreased strength;Other (comment)   Rehab Potential Good   PT Frequency 3x / week   PT Duration 4 weeks   PT Treatment/Interventions ADLs/Self Care Home Management;Therapeutic activities;Patient/family education;DME Instruction;Therapeutic exercise;Gait training;Balance training;Stair training;Neuromuscular re-education;Energy conservation;Functional mobility training   PT Next Visit Plan begin checking STG's: cont endurance and balance exercises   PT Home Exercise Plan see above   Consulted and Agree with Plan of Care Patient        Problem List Patient Active Problem List   Diagnosis Date Noted  . Dyspnea and respiratory abnormality 01/04/2015  . Chronic nausea 12/27/2014  . Cough 12/27/2014  . Hypoxia 11/08/2014  . Dyspnea 11/08/2014  . Hematemesis 11/08/2014  . History of pulmonary embolism 11/08/2014  . Alcohol abuse 11/08/2014   . SVT (supraventricular tachycardia) 11/08/2014  . Acute pulmonary embolus 10/25/2014  . Nausea with vomiting 09/12/2014  . Chronic respiratory failure 06/17/2014  . Elevated TSH 06/17/2014  . Hypokalemia 06/17/2014  . Pulmonary embolus 06/15/2014  . Acute respiratory failure with hypoxia 06/15/2014  . Chronic venous insufficiency 10/26/2013  . Closed fracture of 5th metacarpal 05/02/2013  . Fall 05/02/2013  . 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 Sepsis secondary to pyelonphritis / gram negative rod (E. Coli) bacteremia 06/23/2012  . Leukocytosis 06/22/2012  . COLITIS 06/20/2010  . Malignant neoplasm of  female breast 02/05/2009  . HYPERLIPIDEMIA 02/05/2009  . ANXIETY DEPRESSION 02/05/2009  . ESOPHAGEAL STRICTURE 02/05/2009  . GERD 02/05/2009    Alda Lea, PT 03/19/2015, 1:30 PM  [facility] [address] [place], [place], [address] Phone: [phone]   Fax:  [phone]

## 2015-03-20 ENCOUNTER — Ambulatory Visit: Payer: Medicare Other | Admitting: Physical Therapy

## 2015-03-20 VITALS — BP 86/64 | HR 140

## 2015-03-20 DIAGNOSIS — R269 Unspecified abnormalities of gait and mobility: Secondary | ICD-10-CM | POA: Diagnosis not present

## 2015-03-20 DIAGNOSIS — R5381 Other malaise: Secondary | ICD-10-CM

## 2015-03-20 NOTE — Therapy (Signed)
[facility] [address] [place], [place], [address] Phone: [phone]   Fax:  [phone]  Physical Therapy Treatment  Patient Details  Name: Theresa [name] MRN: [id number] Date of Birth: 1946/02/07 Referring Provider:  Lona Kettle, MD  Encounter Date: 03/20/2015      PT End of Session - 03/20/15 1409    Visit Number 9   Number of Visits 17   Date for PT Re-Evaluation 04/22/15   Authorization Type Medicare   Authorization Time Period 02-21-15 - 04-22-15   PT Start Time 1322   PT Stop Time 1353   PT Time Calculation (min) 31 min   Activity Tolerance Patient limited by fatigue;Treatment limited secondary to medical complications (Comment)  Increased HR with minimal activity today and decreased BP      Past Medical History  Diagnosis Date  . Esophageal stricture   . HLD (hyperlipidemia)   . Dysthymic disorder   . Other and unspecified coagulation defects   . Diaphragmatic hernia without mention of obstruction or gangrene   . PAD (peripheral artery disease)   . Pulmonary embolism 06/2014    "both lungs"  . DVT (deep venous thrombosis) 06/2014    RLE  . GERD (gastroesophageal reflux disease)   . History of hiatal hernia   . Anxiety   . Malignant neoplasm of breast (female), unspecified site 2002    "right", NO BLOOD PRESSURES OR STICKS IN RIGHT ARM  . SVT (supraventricular tachycardia) 11/07/2014  . On home oxygen therapy     "2L at night and prn" (11/08/2014)  . Atherosclerosis   . Alcohol abuse   . Chronic respiratory failure   . Colitis     Past Surgical History  Procedure Laterality Date  . Appendectomy  12/1977  . Cholecystectomy  12/1977  . Breast lumpectomy Right 01/2001  . Tubal ligation  1980's  . Cardiac catheterization  1990's  . Esophagogastroduodenoscopy (egd) with esophageal dilation  X 7    Filed Vitals:   03/20/15 1300 03/20/15 1338  BP: 99/67 86/64  Pulse: 112 140  SpO2:  98%     Visit Diagnosis:  Physical deconditioning      Subjective Assessment - 03/20/15 1324    Subjective Pt nauseated today but says it is normal for her at times.  MD removed her O2 during the day but she still needs at night.   Pertinent History PE's in bil. lungs summer of 2015; tachycardia??; orthostatic hypotension;    Patient Stated Goals improve balance so able to return to pulmonary rehab   Currently in Pain? No/denies                       [facility] Adult PT Treatment/Exercise - 03/20/15 1333    Transfers   Sit to Stand 5: Supervision   Ambulation/Gait   Ambulation/Gait Yes   Ambulation/Gait Assistance 4: Min guard   Ambulation/Gait Assistance Details shakiness present again today   Ambulation Distance (Feet) 100 Feet  4 times   Assistive device Rolling walker   Gait Pattern Step-through pattern;Trunk flexed;Narrow base of support;Shuffle   Ambulation Surface Level;Indoor   Berg Balance Test   Sit to Stand Able to stand  independently using hands   Standing Unsupported Able to stand 30 seconds unsupported   Sitting with Back Unsupported but Feet Supported on Floor or Stool Able to sit safely and securely 2 minutes   Stand to Sit Controls descent by using hands  Transfers Able to transfer safely, definite need of hands   Standing Unsupported with Eyes Closed Able to stand 3 seconds   Standing Ubsupported with Feet Together Needs help to attain position and unable to hold for 15 seconds                PT Education - 03/20/15 1408    Education provided Yes   Education Details Monitor BP, HR and SaO2 at home and call physician or 911 if worsens   Person(s) Educated Patient;Spouse   Methods Explanation   Comprehension Verbalized understanding          PT Short Term Goals - 03/15/15 1400    PT SHORT TERM GOAL #1   Title Pt. will improve Berg score to >/=  28 to demo imporved standing balance.  (03-23-15)   Status On-going   PT SHORT TERM GOAL #2    Title Improve TUG score to </= 25 secs to demo imrpoved functional mobility     (03-23-15)   Status On-going   PT SHORT TERM GOAL #3   Title Incr. gait velocity to >/= 2.0 ft/sec with RW for incr. gait efficiency  (03-23-15)   Status On-going   PT SHORT TERM GOAL #4   Title Independent in HEP for balance and strengthening  (03-23-15)   Status On-going           PT Long Term Goals - 03/15/15 1400    PT LONG TERM GOAL #1   Title Incr. Berg balance test score to >/= 36/56 to decr. fall risk  (04-22-15)   Status On-going   PT LONG TERM GOAL #2   Title Improve TUG score to </= 20 secs with RW for improved functional mobility  (04-22-15)   Status On-going   PT LONG TERM GOAL #3   Title Incr. gait velocity to >/= 2.3 ft/sec with RW for incr. gait efficiency  (04-22-15)   Status On-going   PT LONG TERM GOAL #4   Title Increase FOTO score to >/=60/100 to demo imrpoved pt satisfaction  (04-22-15)   Status On-going               Plan - 03/20/15 1411    Clinical Impression Statement Pt stated she felt nauseated (chronic per epic) when she arrived and shaky.  Appeared anxious.  Completed a portion of BERG and pt needing to use restroom.  Amb to restroom and upon return back to gym, HR 145.  Recovered to 120 after 5 minutes.  PTA decided to end treatment secondary to decreased tolerance today and discussed with Guido Sander, PT.  Discussed monitoring vitals and notifiying MD if symptoms worsened or call 911.  Pt and husband agree.  Pt reports decreased activity at home and sits on couch all day.  Does not go out of house other than to appointments.  Continues with poor endurance and strength.   Pt will benefit from skilled therapeutic intervention in order to improve on the following deficits Abnormal gait;Cardiopulmonary status limiting activity;Decreased endurance;Decreased activity tolerance;Decreased balance;Decreased knowledge of use of DME;Decreased mobility;Decreased strength;Other  (comment)   Rehab Potential Fair   PT Frequency 3x / week   PT Duration 4 weeks   PT Treatment/Interventions ADLs/Self Care Home Management;Therapeutic activities;Patient/family education;DME Instruction;Therapeutic exercise;Gait training;Balance training;Stair training;Neuromuscular re-education;Energy conservation;Functional mobility training   PT Next Visit Plan Check goals and complete G-code     Consulted and Agree with Plan of Care Patient   Family Member Consulted spouse  Problem List Patient Active Problem List   Diagnosis Date Noted  . Dyspnea and respiratory abnormality 01/04/2015  . Chronic nausea 12/27/2014  . Cough 12/27/2014  . Hypoxia 11/08/2014  . Dyspnea 11/08/2014  . Hematemesis 11/08/2014  . History of pulmonary embolism 11/08/2014  . Alcohol abuse 11/08/2014  . SVT (supraventricular tachycardia) 11/08/2014  . Acute pulmonary embolus 10/25/2014  . Nausea with vomiting 09/12/2014  . Chronic respiratory failure 06/17/2014  . Elevated TSH 06/17/2014  . Hypokalemia 06/17/2014  . Pulmonary embolus 06/15/2014  . Acute respiratory failure with hypoxia 06/15/2014  . Chronic venous insufficiency 10/26/2013  . Closed fracture of 5th metacarpal 05/02/2013  . Fall 05/02/2013  . 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 Sepsis secondary to pyelonphritis / gram negative rod (E. Coli) bacteremia 06/23/2012  . Leukocytosis 06/22/2012  . COLITIS 06/20/2010  . Malignant neoplasm of female breast 02/05/2009  . HYPERLIPIDEMIA 02/05/2009  . ANXIETY DEPRESSION 02/05/2009  . ESOPHAGEAL STRICTURE 02/05/2009  . GERD 02/05/2009    Narda Bonds 03/20/2015, 2:19 PM  [facility] [address] [place], [place], [address] Phone: [phone]   Fax:  [place], [place] 03/20/2015  2:19 PM Phone: [phone] Fax: [phone]

## 2015-03-21 ENCOUNTER — Telehealth: Payer: Self-pay | Admitting: Cardiology

## 2015-03-21 NOTE — Telephone Encounter (Signed)
Pt c/o Shortness Of Breath: STAT if SOB developed within the last 24 hours or pt is noticeably SOB on the phone  1. Are you currently SOB (can you hear that pt is SOB on the phone)? No  2. How long have you been experiencing SOB? Since last summer  3. Are you SOB when sitting or when up moving around? Moving around  4. Are you currently experiencing any other symptoms? While at rehab pt experienced Pulse going up to 146, pt felt shaky and dizzy. Please call back and advise.

## 2015-03-21 NOTE — Telephone Encounter (Signed)
Calling stating that her heart rate has been elevated in [facility] rehab at last 2 visits.  Also c/o SOB with any exertion.  According to note in epic HR on 4/7 was 120 with BP 101/69 and on 4/12 HR was 140 with BP 86/64. States she just feels "bad".  Doesn't have an appetite so has lost wt. Denies swelling in ankles. Spoke w/Dr. Meda Coffee who advises that she needs to stop alcohol intake since that causes dehydration; increase Corlanor to 5 mg BID. Advised per Dr. Meda Coffee that not unusual for HR to be elevated when exercising. She verbalizes understanding.  Advised to monitor her HR and BP and to call if continues to be SOB and HR elevated.

## 2015-03-22 ENCOUNTER — Ambulatory Visit: Payer: Medicare Other | Admitting: Physical Therapy

## 2015-03-26 ENCOUNTER — Ambulatory Visit: Payer: Medicare Other | Admitting: Physical Therapy

## 2015-03-26 ENCOUNTER — Encounter: Payer: Self-pay | Admitting: Physical Therapy

## 2015-03-26 VITALS — BP 90/69 | HR 115

## 2015-03-26 DIAGNOSIS — R269 Unspecified abnormalities of gait and mobility: Secondary | ICD-10-CM | POA: Diagnosis not present

## 2015-03-26 DIAGNOSIS — R6889 Other general symptoms and signs: Secondary | ICD-10-CM

## 2015-03-26 NOTE — Therapy (Signed)
[facility] [address] [place], [place], [address] Phone: [phone]   Fax:  [phone]  Physical Therapy Treatment  Patient Details  Name: Theresa [name] MRN: [id number] Date of Birth: 06-30-1946 Referring Provider:  Lona Kettle, MD  Encounter Date: 03/26/2015      PT End of Session - 03/26/15 1722    Visit Number 10  G10   Number of Visits 17   Date for PT Re-Evaluation 04/22/15   Authorization Type Medicare   Authorization Time Period 02-21-15 - 04-22-15   PT Start Time 1531   PT Stop Time 6962   PT Time Calculation (min) 46 min      Past Medical History  Diagnosis Date  . Esophageal stricture   . HLD (hyperlipidemia)   . Dysthymic disorder   . Other and unspecified coagulation defects   . Diaphragmatic hernia without mention of obstruction or gangrene   . PAD (peripheral artery disease)   . Pulmonary embolism 06/2014    "both lungs"  . DVT (deep venous thrombosis) 06/2014    RLE  . GERD (gastroesophageal reflux disease)   . History of hiatal hernia   . Anxiety   . Malignant neoplasm of breast (female), unspecified site 2002    "right", NO BLOOD PRESSURES OR STICKS IN RIGHT ARM  . SVT (supraventricular tachycardia) 11/07/2014  . On home oxygen therapy     "2L at night and prn" (11/08/2014)  . Atherosclerosis   . Alcohol abuse   . Chronic respiratory failure   . Colitis     Past Surgical History  Procedure Laterality Date  . Appendectomy  12/1977  . Cholecystectomy  12/1977  . Breast lumpectomy Right 01/2001  . Tubal ligation  1980's  . Cardiac catheterization  1990's  . Esophagogastroduodenoscopy (egd) with esophageal dilation  X 7    Filed Vitals:   03/26/15 1547  BP: 90/69  Pulse: 115  SpO2: 92%    Visit Diagnosis:  Abnormality of gait  Decreased activity tolerance      Subjective Assessment - 03/26/15 1707    Subjective Pt. reports staying at home alone this past weekend  -husband went to [place] to see grandaughter's dance performance but she did not feel well enough to go   Pertinent History PE's in bil. lungs summer of 2015; tachycardia??; orthostatic hypotension;    Patient Stated Goals improve balance so able to return to pulmonary rehab   Currently in Pain? No/denies                         [facility] Adult PT Treatment/Exercise - 03/26/15 1538    Transfers   Sit to Stand 5: Supervision   Ambulation/Gait   Ambulation/Gait --   Gait velocity 2.58 ft/sec  12.25 secs;  12.69 secs with RW   Berg Balance Test   Sit to Stand Able to stand  independently using hands   Standing Unsupported Able to stand 2 minutes with supervision   Sitting with Back Unsupported but Feet Supported on Floor or Stool Able to sit safely and securely 2 minutes   Stand to Sit Controls descent by using hands   Transfers Able to transfer safely, definite need of hands   Standing Unsupported with Eyes Closed Able to stand 3 seconds   Standing Ubsupported with Feet Together Needs help to attain position and unable to hold for 15 seconds   From Standing, Reach Forward with Outstretched Arm Can reach  forward >12 cm safely (5")   From Standing Position, Pick up Object from [place] to pick up shoe, needs supervision   From Standing Position, Turn to Look Behind Over each Shoulder Turn sideways only but maintains balance   Turn 360 Degrees Needs close supervision or verbal cueing   Standing Unsupported, Alternately Place Feet on Step/Stool Able to complete >2 steps/needs minimal assist   Standing Unsupported, One Foot in Front Able to take small step independently and hold 30 seconds   Standing on One Leg Tries to lift leg/unable to hold 3 seconds but remains standing independently   Total Score 31   Timed Up and Go Test   Normal TUG (seconds) 20.28  18.00 secs with RW 2nd trial     Self care; Discussed HEP - revised to standing kicks - 3 directions and sit to stand x  10 reps with minimal UE support-  Pt. Agrees with HEP modifications and with this plan           PT Education - 03/26/15 1721    Education Details Revised HEP to forward, back, side kicks x 10 reps each at counter; sit to stand x 10 reps with UE support prn; - attempted to decrease # to incr. pt. compliance   Person(s) Educated Patient;Spouse   Methods Explanation   Comprehension Verbalized understanding          PT Short Term Goals - 03/26/15 1714    PT SHORT TERM GOAL #1   Title Pt. will improve Berg score to >/=  28 to demo imporved standing balance.  (03-23-15)   Baseline 31/56 on 03-26-15   Status Achieved   PT SHORT TERM GOAL #2   Title Improve TUG score to </= 25 secs to demo imrpoved functional mobility     (03-23-15)   Baseline 20.28 secs with RW; 18.00 secs with RW   Status Achieved   PT SHORT TERM GOAL #3   Title Incr. gait velocity to >/= 2.0 ft/sec with RW for incr. gait efficiency  (03-23-15)   Baseline 12.69; 12.25    2.58 ft/sec with RW 03-26-15   Status Achieved   PT SHORT TERM GOAL #4   Title Independent in HEP for balance and strengthening  (03-23-15)   Baseline pt. not performing consistently 03-26-15   Status On-going           PT Long Term Goals - 03/15/15 1400    PT LONG TERM GOAL #1   Title Incr. Berg balance test score to >/= 36/56 to decr. fall risk  (04-22-15)   Status On-going   PT LONG TERM GOAL #2   Title Improve TUG score to </= 20 secs with RW for improved functional mobility  (04-22-15)   Status On-going   PT LONG TERM GOAL #3   Title Incr. gait velocity to >/= 2.3 ft/sec with RW for incr. gait efficiency  (04-22-15)   Status On-going   PT LONG TERM GOAL #4   Title Increase FOTO score to >/=60/100 to demo imrpoved pt satisfaction  (04-22-15)   Status On-going               Plan - 03/26/15 1723    Clinical Impression Statement Pt. met all STG's - HEP is ongoing as it is revised to incr. pt. compliance; pt.'s balance is improving  but pt. cont. to have tremors with static standing; resting HR remains high (110) but pt. states she called her cardiologist last week when BP  was low and HR was high and they did not seem to be very concerned; pt. has cardiologist appt. at end of May                                                                                         Pt will benefit from skilled therapeutic intervention in order to improve on the following deficits Abnormal gait;Cardiopulmonary status limiting activity;Decreased endurance;Decreased activity tolerance;Decreased balance;Decreased knowledge of use of DME;Decreased mobility;Decreased strength;Other (comment)   Rehab Potential Good   PT Frequency 2x / week   PT Duration 4 weeks   PT Treatment/Interventions ADLs/Self Care Home Management;Therapeutic activities;Patient/family education;DME Instruction;Therapeutic exercise;Gait training;Balance training;Stair training;Neuromuscular re-education;Energy conservation;Functional mobility training   PT Next Visit Plan check HEP and cont. with balance and strengthening   PT Home Exercise Plan see above   Consulted and Agree with Plan of Care Patient          G-Codes - 2015-04-11 1733    Functional Assessment Tool Used Merrilee Jansky score 31/56; TUG 20.28:  gait velocity 2.58 ft/sec   Functional Limitation Mobility: Walking and moving around   Mobility: Walking and Moving Around Current Status [phone]) At least 60 percent but less than 80 percent impaired, limited or restricted   Mobility: Walking and Moving Around Goal Status [phone]) At least 40 percent but less than 60 percent impaired, limited or restricted      Problem List Patient Active Problem List   Diagnosis Date Noted  . Dyspnea and respiratory abnormality 01/04/2015  . Chronic nausea 12/27/2014  . Cough 12/27/2014  . Hypoxia 11/08/2014  . Dyspnea 11/08/2014  . Hematemesis 11/08/2014  . History of pulmonary embolism 11/08/2014  . Alcohol abuse 11/08/2014  . SVT  (supraventricular tachycardia) 11/08/2014  . Acute pulmonary embolus 10/25/2014  . Nausea with vomiting 09/12/2014  . Chronic respiratory failure 06/17/2014  . Elevated TSH 06/17/2014  . Hypokalemia 06/17/2014  . Pulmonary embolus 06/15/2014  . Acute respiratory failure with hypoxia 06/15/2014  . Chronic venous insufficiency 10/26/2013  . Closed fracture of 5th metacarpal 05/02/2013  . Fall 05/02/2013  . 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 Sepsis secondary to pyelonphritis / gram negative rod (E. Coli) bacteremia 06/23/2012  . Leukocytosis 06/22/2012  . COLITIS 06/20/2010  . Malignant neoplasm of female breast 02/05/2009  . HYPERLIPIDEMIA 02/05/2009  . ANXIETY DEPRESSION 02/05/2009  . ESOPHAGEAL STRICTURE 02/05/2009  . GERD 02/05/2009    Alda Lea, PT 04/11/15, 5:35 PM  [facility] [address] [place], [place], [address] Phone: [phone]   Fax:  [phone]

## 2015-03-27 ENCOUNTER — Ambulatory Visit: Payer: Medicare Other

## 2015-03-27 VITALS — HR 100

## 2015-03-27 DIAGNOSIS — R269 Unspecified abnormalities of gait and mobility: Secondary | ICD-10-CM

## 2015-03-27 DIAGNOSIS — R42 Dizziness and giddiness: Secondary | ICD-10-CM

## 2015-03-27 DIAGNOSIS — R6889 Other general symptoms and signs: Secondary | ICD-10-CM

## 2015-03-27 NOTE — Patient Instructions (Signed)
Perform all balance activities at the kitchen sink with a chair behind you for safety:  Feet Apart, Varied Arm Positions - Eyes Open   With eyes open, feet shoulder width apart, arms at your side, look straight ahead at a stationary object. Hold __30-60__ seconds. Repeat __3__ times per session. Do __1__ sessions per day.  Copyright  VHI. All rights reserved.  Feet Apart, Head Motion - Eyes Open   With eyes open, feet apart, move head slowly: up and down and side to side for 30 seconds. Repeat __3__ times per session. Do __1__ sessions per day.  Copyright  VHI. All rights reserved.  Feet Apart, Varied Arm Positions - Eyes Closed   Stand with feet shoulder width apart and arms at your side. Close eyes and visualize upright position. Hold _5-15___ seconds. Repeat __3__ times per session. Do __1__ sessions per day.  Copyright  VHI. All rights reserved.  Feet Together, Varied Arm Positions - Eyes Open   With eyes open, feet together, arms at your side, look straight ahead at a stationary object. Hold __30__ seconds. Repeat __3__ times per session. Do __1__ sessions per day.  Copyright  VHI. All rights reserved.

## 2015-03-27 NOTE — Therapy (Signed)
[facility] [address] [place], [place], [address] Phone: [phone]   Fax:  [phone]  Physical Therapy Treatment  Patient Details  Name: Theresa [name] MRN: [id number] Date of Birth: Feb 15, 1946 Referring Provider:  Lona Kettle, MD  Encounter Date: 03/27/2015      PT End of Session - 03/27/15 1630    Visit Number 56  G11   Number of Visits 17   Date for PT Re-Evaluation 04/22/15   Authorization Type Medicare   Authorization Time Period 02-21-15 - 04-22-15   PT Start Time 1532   PT Stop Time 1616   PT Time Calculation (min) 44 min   Equipment Utilized During Treatment Gait belt   Activity Tolerance Patient limited by fatigue;Treatment limited secondary to medical complications (Comment)  increased HR with minimal activity   Behavior During Therapy [facility] for tasks assessed/performed      Past Medical History  Diagnosis Date  . Esophageal stricture   . HLD (hyperlipidemia)   . Dysthymic disorder   . Other and unspecified coagulation defects   . Diaphragmatic hernia without mention of obstruction or gangrene   . PAD (peripheral artery disease)   . Pulmonary embolism 06/2014    "both lungs"  . DVT (deep venous thrombosis) 06/2014    RLE  . GERD (gastroesophageal reflux disease)   . History of hiatal hernia   . Anxiety   . Malignant neoplasm of breast (female), unspecified site 2002    "right", NO BLOOD PRESSURES OR STICKS IN RIGHT ARM  . SVT (supraventricular tachycardia) 11/07/2014  . On home oxygen therapy     "2L at night and prn" (11/08/2014)  . Atherosclerosis   . Alcohol abuse   . Chronic respiratory failure   . Colitis     Past Surgical History  Procedure Laterality Date  . Appendectomy  12/1977  . Cholecystectomy  12/1977  . Breast lumpectomy Right 01/2001  . Tubal ligation  1980's  . Cardiac catheterization  1990's  . Esophagogastroduodenoscopy (egd) with esophageal dilation  X 7     Filed Vitals:   03/27/15 1546 03/27/15 1549  Pulse: 114 100  SpO2: 94%     Visit Diagnosis:  Decreased activity tolerance  Abnormality of gait  Dizziness and giddiness      Subjective Assessment - 03/27/15 1538    Subjective Pt denied falls or changes since last visit. Pt's husband, Barbarann Ehlers present during session.  Pt reported she is still having difficulty with performing static balance activities without UE support on RW.   Pertinent History PE's in bil. lungs summer of 2015; tachycardia??; orthostatic hypotension;    Patient Stated Goals improve balance so able to return to pulmonary rehab   Currently in Pain? No/denies                         [facility] Adult PT Treatment/Exercise - 03/27/15 1625    Balance   Balance Assessed Yes   Static Standing Balance   Static Standing - Balance Support No upper extremity supported   Static Standing - Level of Assistance 5: Stand by assistance;Other (comment)  min guard   Static Standing - Comment/# of Minutes Performed at counter with chair behind pt; B LEs with 5-60second holds x2/activity on non-compliant surfaces: feet apart with eyes open/closed, with and without head turns; feet together with eyes open. VC's for technique. Pt noted to experience increased postural sway with eyes closed and with head turns.  Pt also exhibited tremors during static standing with feet apart and required seated rest break 2/2 fatigue. Pt required UE support during 2 LOB episodes to maintain balance.     Therex:  Reviewed HEP - revised to standing kicks - 3 directions x10/LE and sit to stand x 10 reps with minimal UE support. Pt required one seated rest break 2/2 fatigue. SciFit with all B UE/LE; level 1.0 for 2 minutes, >60RPMs: per pt request so her husband could see how the machine worked.  Vitals assessed during session, see vital section for details.           PT Education - 03/27/15 1630    Education provided Yes   Education  Details Reviewed standing and sit<>stand HEP. Balance HEP   Person(s) Educated Patient;Spouse   Methods Explanation;Demonstration;Handout;Verbal cues   Comprehension Verbalized understanding;Returned demonstration          PT Short Term Goals - 03/26/15 1714    PT SHORT TERM GOAL #1   Title Pt. will improve Berg score to >/=  28 to demo imporved standing balance.  (03-23-15)   Baseline 31/56 on 03-26-15   Status Achieved   PT SHORT TERM GOAL #2   Title Improve TUG score to </= 25 secs to demo imrpoved functional mobility     (03-23-15)   Baseline 20.28 secs with RW; 18.00 secs with RW   Status Achieved   PT SHORT TERM GOAL #3   Title Incr. gait velocity to >/= 2.0 ft/sec with RW for incr. gait efficiency  (03-23-15)   Baseline 12.69; 12.25    2.58 ft/sec with RW 03-26-15   Status Achieved   PT SHORT TERM GOAL #4   Title Independent in HEP for balance and strengthening  (03-23-15)   Baseline pt. not performing consistently 03-26-15   Status On-going           PT Long Term Goals - 03/15/15 1400    PT LONG TERM GOAL #1   Title Incr. Berg balance test score to >/= 36/56 to decr. fall risk  (04-22-15)   Status On-going   PT LONG TERM GOAL #2   Title Improve TUG score to </= 20 secs with RW for improved functional mobility  (04-22-15)   Status On-going   PT LONG TERM GOAL #3   Title Incr. gait velocity to >/= 2.3 ft/sec with RW for incr. gait efficiency  (04-22-15)   Status On-going   PT LONG TERM GOAL #4   Title Increase FOTO score to >/=60/100 to demo imrpoved pt satisfaction  (04-22-15)   Status On-going               Plan - 03/27/15 1631    Clinical Impression Statement Pt continues to be limited by decreased endurance and increased HR after minimal activity. Pt demonstrated progress with standing therex, as she required less cues and only required cues to decrease compensations (such as forward trunk lean) during last 2-3 reps. Continue with POC.   Pt will benefit from  skilled therapeutic intervention in order to improve on the following deficits Abnormal gait;Cardiopulmonary status limiting activity;Decreased endurance;Decreased activity tolerance;Decreased balance;Decreased knowledge of use of DME;Decreased mobility;Decreased strength;Other (comment)   Rehab Potential Good   PT Frequency 2x / week   PT Duration 4 weeks   PT Treatment/Interventions ADLs/Self Care Home Management;Therapeutic activities;Patient/family education;DME Instruction;Therapeutic exercise;Gait training;Balance training;Stair training;Neuromuscular re-education;Energy conservation;Functional mobility training   PT Next Visit Plan Progress balance, strengthening and gait training   Consulted and  Agree with Plan of Care Patient   Family Member Consulted spouse-Dick          G-Codes - 04-16-15 1733    Functional Assessment Tool Used Merrilee Jansky score 31/56; TUG 20.28:  gait velocity 2.58 ft/sec   Functional Limitation Mobility: Walking and moving around   Mobility: Walking and Moving Around Current Status [phone]) At least 60 percent but less than 80 percent impaired, limited or restricted   Mobility: Walking and Moving Around Goal Status [phone]) At least 40 percent but less than 60 percent impaired, limited or restricted      Problem List Patient Active Problem List   Diagnosis Date Noted  . Dyspnea and respiratory abnormality 01/04/2015  . Chronic nausea 12/27/2014  . Cough 12/27/2014  . Hypoxia 11/08/2014  . Dyspnea 11/08/2014  . Hematemesis 11/08/2014  . History of pulmonary embolism 11/08/2014  . Alcohol abuse 11/08/2014  . SVT (supraventricular tachycardia) 11/08/2014  . Acute pulmonary embolus 10/25/2014  . Nausea with vomiting 09/12/2014  . Chronic respiratory failure 06/17/2014  . Elevated TSH 06/17/2014  . Hypokalemia 06/17/2014  . Pulmonary embolus 06/15/2014  . Acute respiratory failure with hypoxia 06/15/2014  . Chronic venous insufficiency 10/26/2013  . Closed  fracture of 5th metacarpal 05/02/2013  . Fall 05/02/2013  . 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 Sepsis secondary to pyelonphritis / gram negative rod (E. Coli) bacteremia 06/23/2012  . Leukocytosis 06/22/2012  . COLITIS 06/20/2010  . Malignant neoplasm of female breast 02/05/2009  . HYPERLIPIDEMIA 02/05/2009  . ANXIETY DEPRESSION 02/05/2009  . ESOPHAGEAL STRICTURE 02/05/2009  . GERD 02/05/2009    Jawanna Dykman L 03/27/2015, 4:36 PM  [facility] [address] [place], [place], [address] Phone: [phone]   Fax:  [phone]     Geoffry Paradise, PT,DPT 03/27/2015 4:36 PM Phone: [phone] Fax: [phone]

## 2015-03-28 ENCOUNTER — Other Ambulatory Visit: Payer: Self-pay | Admitting: *Deleted

## 2015-03-28 MED ORDER — ONDANSETRON 4 MG PO TBDP: 4.0000 mg | ORAL_TABLET | ORAL | Status: DC | PRN

## 2015-03-28 NOTE — Telephone Encounter (Signed)
Received fax from pharmacy. Refilled Zofran for pt

## 2015-03-29 ENCOUNTER — Ambulatory Visit: Payer: Medicare Other | Admitting: Physical Therapy

## 2015-03-29 DIAGNOSIS — R269 Unspecified abnormalities of gait and mobility: Secondary | ICD-10-CM | POA: Diagnosis not present

## 2015-03-29 DIAGNOSIS — R5381 Other malaise: Secondary | ICD-10-CM

## 2015-03-29 DIAGNOSIS — R6889 Other general symptoms and signs: Secondary | ICD-10-CM

## 2015-03-30 ENCOUNTER — Encounter: Payer: Self-pay | Admitting: Physical Therapy

## 2015-03-30 NOTE — Therapy (Signed)
[facility] [address] [place], [place], [address] Phone: [phone]   Fax:  [phone]  Physical Therapy Treatment  Patient Details  Name: Theresa [name] MRN: [id number] Date of Birth: 01-06-1946 Referring Provider:  Lona Kettle, MD  Encounter Date: 03/29/2015      PT End of Session - 03/30/15 1247    Visit Number 12  G2   Number of Visits 17   Date for PT Re-Evaluation 04/22/15   Authorization Type Medicare   Authorization Time Period 02-21-15 - 04-22-15   PT Start Time 1020   PT Stop Time 1105   PT Time Calculation (min) 45 min      Past Medical History  Diagnosis Date  . Esophageal stricture   . HLD (hyperlipidemia)   . Dysthymic disorder   . Other and unspecified coagulation defects   . Diaphragmatic hernia without mention of obstruction or gangrene   . PAD (peripheral artery disease)   . Pulmonary embolism 06/2014    "both lungs"  . DVT (deep venous thrombosis) 06/2014    RLE  . GERD (gastroesophageal reflux disease)   . History of hiatal hernia   . Anxiety   . Malignant neoplasm of breast (female), unspecified site 2002    "right", NO BLOOD PRESSURES OR STICKS IN RIGHT ARM  . SVT (supraventricular tachycardia) 11/07/2014  . On home oxygen therapy     "2L at night and prn" (11/08/2014)  . Atherosclerosis   . Alcohol abuse   . Chronic respiratory failure   . Colitis     Past Surgical History  Procedure Laterality Date  . Appendectomy  12/1977  . Cholecystectomy  12/1977  . Breast lumpectomy Right 01/2001  . Tubal ligation  1980's  . Cardiac catheterization  1990's  . Esophagogastroduodenoscopy (egd) with esophageal dilation  X 7    There were no vitals filed for this visit.  Visit Diagnosis:  Decreased activity tolerance  Abnormality of gait  Physical deconditioning      Subjective Assessment - 03/30/15 1241    Subjective Pt. reports no changes since last visit - says husband  is going to coach her in doing exercises - knows that she needs to do them more than what she is currently doing   Pertinent History PE's in bil. lungs summer of 2015; tachycardia??; orthostatic hypotension;    Patient Stated Goals improve balance so able to return to pulmonary rehab   Currently in Pain? No/denies                         [facility] Adult PT Treatment/Exercise - 03/30/15 0001    Transfers   Sit to Stand 5: Supervision;With upper extremity assist  from mat table   Ambulation/Gait   Ambulation Distance (Feet) 240 Feet   Static Standing Balance   Static Standing - Balance Support No upper extremity supported   Static Standing - Level of Assistance 5: Stand by assistance;Other (comment)  min guard   Knee/Hip Exercises: Standing   Forward Step Up Both;10 reps   Rocker Board 1 minute  with UE support   Ankle Exercises: Aerobic   Stationary Bike Nustep level 3 x 5"                  PT Short Term Goals - 03/26/15 1714    PT SHORT TERM GOAL #1   Title Pt. will improve Berg score to >/=  28 to demo imporved standing  balance.  (03-23-15)   Baseline 31/56 on 03-26-15   Status Achieved   PT SHORT TERM GOAL #2   Title Improve TUG score to </= 25 secs to demo imrpoved functional mobility     (03-23-15)   Baseline 20.28 secs with RW; 18.00 secs with RW   Status Achieved   PT SHORT TERM GOAL #3   Title Incr. gait velocity to >/= 2.0 ft/sec with RW for incr. gait efficiency  (03-23-15)   Baseline 12.69; 12.25    2.58 ft/sec with RW 03-26-15   Status Achieved   PT SHORT TERM GOAL #4   Title Independent in HEP for balance and strengthening  (03-23-15)   Baseline pt. not performing consistently 03-26-15   Status On-going           PT Long Term Goals - 03/15/15 1400    PT LONG TERM GOAL #1   Title Incr. Berg balance test score to >/= 36/56 to decr. fall risk  (04-22-15)   Status On-going   PT LONG TERM GOAL #2   Title Improve TUG score to </= 20 secs with  RW for improved functional mobility  (04-22-15)   Status On-going   PT LONG TERM GOAL #3   Title Incr. gait velocity to >/= 2.3 ft/sec with RW for incr. gait efficiency  (04-22-15)   Status On-going   PT LONG TERM GOAL #4   Title Increase FOTO score to >/=60/100 to demo imrpoved pt satisfaction  (04-22-15)   Status On-going               Plan - 03/30/15 1248    Clinical Impression Statement Pt. cont to have dyspnea with activities - requires frequent seated rest breaks but of short duration   Pt will benefit from skilled therapeutic intervention in order to improve on the following deficits Abnormal gait;Cardiopulmonary status limiting activity;Decreased endurance;Decreased activity tolerance;Decreased balance;Decreased knowledge of use of DME;Decreased mobility;Decreased strength;Other (comment)   Rehab Potential Good   PT Frequency 2x / week   PT Duration 4 weeks   PT Treatment/Interventions ADLs/Self Care Home Management;Therapeutic activities;Patient/family education;DME Instruction;Therapeutic exercise;Gait training;Balance training;Stair training;Neuromuscular re-education;Energy conservation;Functional mobility training   PT Next Visit Plan Progress balance, strengthening and gait training   PT Home Exercise Plan see above   Consulted and Agree with Plan of Care Patient        Problem List Patient Active Problem List   Diagnosis Date Noted  . Dyspnea and respiratory abnormality 01/04/2015  . Chronic nausea 12/27/2014  . Cough 12/27/2014  . Hypoxia 11/08/2014  . Dyspnea 11/08/2014  . Hematemesis 11/08/2014  . History of pulmonary embolism 11/08/2014  . Alcohol abuse 11/08/2014  . SVT (supraventricular tachycardia) 11/08/2014  . Acute pulmonary embolus 10/25/2014  . Nausea with vomiting 09/12/2014  . Chronic respiratory failure 06/17/2014  . Elevated TSH 06/17/2014  . Hypokalemia 06/17/2014  . Pulmonary embolus 06/15/2014  . Acute respiratory failure with hypoxia  06/15/2014  . Chronic venous insufficiency 10/26/2013  . Closed fracture of 5th metacarpal 05/02/2013  . Fall 05/02/2013  . 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 Sepsis secondary to pyelonphritis / gram negative rod (E. Coli) bacteremia 06/23/2012  . Leukocytosis 06/22/2012  . COLITIS 06/20/2010  . Malignant neoplasm of female breast 02/05/2009  . HYPERLIPIDEMIA 02/05/2009  . ANXIETY DEPRESSION 02/05/2009  . ESOPHAGEAL STRICTURE 02/05/2009  . GERD 02/05/2009    Alda Lea, PT 03/30/2015, 12:51 PM  [facility] [address] [place], [place], [address] Phone: [phone]   Fax:  [phone]

## 2015-04-02 ENCOUNTER — Ambulatory Visit: Payer: Medicare Other | Admitting: Physical Therapy

## 2015-04-02 ENCOUNTER — Other Ambulatory Visit: Payer: Self-pay | Admitting: Emergency Medicine

## 2015-04-02 ENCOUNTER — Encounter: Payer: Self-pay | Admitting: Physical Therapy

## 2015-04-02 DIAGNOSIS — R6889 Other general symptoms and signs: Secondary | ICD-10-CM

## 2015-04-02 DIAGNOSIS — R0600 Dyspnea, unspecified: Secondary | ICD-10-CM

## 2015-04-02 DIAGNOSIS — R269 Unspecified abnormalities of gait and mobility: Secondary | ICD-10-CM | POA: Diagnosis not present

## 2015-04-02 DIAGNOSIS — R5381 Other malaise: Secondary | ICD-10-CM

## 2015-04-02 NOTE — Therapy (Signed)
[facility] [address] [place], [place], [address] Phone: [phone]   Fax:  [phone]  Physical Therapy Treatment  Patient Details  Name: Theresa [name] MRN: [id number] Date of Birth: [birth year] Referring Provider:  Lona Kettle, MD  Encounter Date: 04/02/2015      PT End of Session - 04/02/15 1824    Visit Number 13  G3   Number of Visits 17   Date for PT Re-Evaluation 04/22/15   Authorization Type Medicare   Authorization Time Period 02-21-15 - 04-22-15   PT Start Time 1530   PT Stop Time 1616   PT Time Calculation (min) 46 min   Equipment Utilized During Treatment Cervical collar      Past Medical History  Diagnosis Date  . Esophageal stricture   . HLD (hyperlipidemia)   . Dysthymic disorder   . Other and unspecified coagulation defects   . Diaphragmatic hernia without mention of obstruction or gangrene   . PAD (peripheral artery disease)   . Pulmonary embolism 06/2014    "both lungs"  . DVT (deep venous thrombosis) 06/2014    RLE  . GERD (gastroesophageal reflux disease)   . History of hiatal hernia   . Anxiety   . Malignant neoplasm of breast (female), unspecified site 2002    "right", NO BLOOD PRESSURES OR STICKS IN RIGHT ARM  . SVT (supraventricular tachycardia) 11/07/2014  . On home oxygen therapy     "2L at night and prn" (11/08/2014)  . Atherosclerosis   . Alcohol abuse   . Chronic respiratory failure   . Colitis     Past Surgical History  Procedure Laterality Date  . Appendectomy  12/1977  . Cholecystectomy  12/1977  . Breast lumpectomy Right 01/2001  . Tubal ligation  1980's  . Cardiac catheterization  1990's  . Esophagogastroduodenoscopy (egd) with esophageal dilation  X 7    There were no vitals filed for this visit.  Visit Diagnosis:  Abnormality of gait  Physical deconditioning  Decreased activity tolerance      Subjective Assessment - 04/02/15 1815    Subjective  Pt. states she is still not doing exercises as much as she should be at home; uses cane/furniture walking in the home and uses RW when she goes out in community  has appt with pulmonary MD tomorrow   Pertinent History PE's in bil. lungs summer of 2015; tachycardia??; orthostatic hypotension;    Patient Stated Goals improve balance so able to return to pulmonary rehab   Currently in Pain? No/denies                         [facility] Adult PT Treatment/Exercise - 04/02/15 0001    Ambulation/Gait   Ambulation/Gait Assistance 5: Supervision   Ambulation/Gait Assistance Details becomes somewhat SOB after approx. 300'   Ambulation Distance (Feet) 480 Feet  240' with tripod based cane   Assistive device Rolling walker   Gait Pattern Step-through pattern   Ambulation Surface Level;Indoor   High Level Balance   High Level Balance Activities Figure 8 turns;Negotitating around obstacles;Negotiating over obstacles  ladder on floor to incr. stp length with RW, then with [facility]   Knee/Hip Exercises: Aerobic   Stationary Bike Nustep level 3 x 4" with bil. UE and LE's   Knee/Hip Exercises: Standing   Rocker Board 1 minute     TherEx:  Heel raises x 10 reps each; sit to stand x 5 reps with  UE support from mat with SBA on floor  NeuroRe-ed; standing on BOSU - weight shifts laterally with UE support; standing with minimal UE support with CGA           PT Short Term Goals - 03/26/15 1714    PT SHORT TERM GOAL #1   Title Pt. will improve Berg score to >/=  28 to demo imporved standing balance.  (03-23-15)   Baseline 31/56 on 03-26-15   Status Achieved   PT SHORT TERM GOAL #2   Title Improve TUG score to </= 25 secs to demo imrpoved functional mobility     (03-23-15)   Baseline 20.28 secs with RW; 18.00 secs with RW   Status Achieved   PT SHORT TERM GOAL #3   Title Incr. gait velocity to >/= 2.0 ft/sec with RW for incr. gait efficiency  (03-23-15)   Baseline 12.69; 12.25    2.58 ft/sec  with RW 03-26-15   Status Achieved   PT SHORT TERM GOAL #4   Title Independent in HEP for balance and strengthening  (03-23-15)   Baseline pt. not performing consistently 03-26-15   Status On-going           PT Long Term Goals - 03/15/15 1400    PT LONG TERM GOAL #1   Title Incr. Berg balance test score to >/= 36/56 to decr. fall risk  (04-22-15)   Status On-going   PT LONG TERM GOAL #2   Title Improve TUG score to </= 20 secs with RW for improved functional mobility  (04-22-15)   Status On-going   PT LONG TERM GOAL #3   Title Incr. gait velocity to >/= 2.3 ft/sec with RW for incr. gait efficiency  (04-22-15)   Status On-going   PT LONG TERM GOAL #4   Title Increase FOTO score to >/=60/100 to demo imrpoved pt satisfaction  (04-22-15)   Status On-going               Plan - 04/02/15 1825    Clinical Impression Statement Balance is improving - pt. able to amb. safely with cane for household distances; cont. to become SOB with prolonged standing/ambulation   Pt will benefit from skilled therapeutic intervention in order to improve on the following deficits Abnormal gait;Cardiopulmonary status limiting activity;Decreased endurance;Decreased activity tolerance;Decreased balance;Decreased knowledge of use of DME;Decreased mobility;Decreased strength;Other (comment)   Rehab Potential Good   PT Frequency 2x / week   PT Duration 4 weeks   PT Treatment/Interventions ADLs/Self Care Home Management;Therapeutic activities;Patient/family education;DME Instruction;Therapeutic exercise;Gait training;Balance training;Stair training;Neuromuscular re-education;Energy conservation;Functional mobility training   PT Next Visit Plan Progress balance, strengthening and gait training   PT Home Exercise Plan see above   Consulted and Agree with Plan of Care Patient        Problem List Patient Active Problem List   Diagnosis Date Noted  . Dyspnea and respiratory abnormality 01/04/2015  . Chronic  nausea 12/27/2014  . Cough 12/27/2014  . Hypoxia 11/08/2014  . Dyspnea 11/08/2014  . Hematemesis 11/08/2014  . History of pulmonary embolism 11/08/2014  . Alcohol abuse 11/08/2014  . SVT (supraventricular tachycardia) 11/08/2014  . Acute pulmonary embolus 10/25/2014  . Nausea with vomiting 09/12/2014  . Chronic respiratory failure 06/17/2014  . Elevated TSH 06/17/2014  . Hypokalemia 06/17/2014  . Pulmonary embolus 06/15/2014  . Acute respiratory failure with hypoxia 06/15/2014  . Chronic venous insufficiency 10/26/2013  . Closed fracture of 5th metacarpal 05/02/2013  . Fall 05/02/2013  . Atherosclerosis of  native arteries of extremity with intermittent claudication 10/20/2012  . Sepsis secondary to pyelonphritis / gram negative rod (E. Coli) bacteremia 06/23/2012  . Leukocytosis 06/22/2012  . COLITIS 06/20/2010  . Malignant neoplasm of female breast 02/05/2009  . HYPERLIPIDEMIA 02/05/2009  . ANXIETY DEPRESSION 02/05/2009  . ESOPHAGEAL STRICTURE 02/05/2009  . GERD 02/05/2009    Alda Lea, PT 04/02/2015, 6:31 PM  [facility] [address] [place], [place], [address] Phone: [phone]   Fax:  [phone]

## 2015-04-03 ENCOUNTER — Encounter: Payer: Self-pay | Admitting: Emergency Medicine

## 2015-04-03 ENCOUNTER — Ambulatory Visit: Payer: Medicare Other | Admitting: Emergency Medicine

## 2015-04-03 ENCOUNTER — Ambulatory Visit (INDEPENDENT_AMBULATORY_CARE_PROVIDER_SITE_OTHER): Payer: Medicare Other | Admitting: Emergency Medicine

## 2015-04-03 ENCOUNTER — Ambulatory Visit: Payer: PRIVATE HEALTH INSURANCE | Admitting: Emergency Medicine

## 2015-04-03 VITALS — BP 110/72 | HR 99 | Ht 62.0 in | Wt 157.0 lb

## 2015-04-03 DIAGNOSIS — R0600 Dyspnea, unspecified: Secondary | ICD-10-CM | POA: Diagnosis not present

## 2015-04-03 DIAGNOSIS — I2699 Other pulmonary embolism without acute cor pulmonale: Secondary | ICD-10-CM

## 2015-04-03 LAB — PULMONARY FUNCTION TEST
DL/VA % pred: 71 %
DL/VA: 3.25 ml/min/mmHg/L
DLCO unc % pred: 47 %
DLCO unc: 10.22 ml/min/mmHg
FEF 25-75 Post: 2.14 L/sec
FEF 25-75 Pre: 1.64 L/sec
FEF2575-%Change-Post: 30 %
FEF2575-%Pred-Post: 119 %
FEF2575-%Pred-Pre: 90 %
FEV1-%Change-Post: 5 %
FEV1-%Pred-Post: 97 %
FEV1-%Pred-Pre: 92 %
FEV1-Post: 2.04 L
FEV1-Pre: 1.93 L
FEV1FVC-%Change-Post: 3 %
FEV1FVC-%Pred-Pre: 102 %
FEV6-%Change-Post: 2 %
FEV6-%Pred-Post: 95 %
FEV6-%Pred-Pre: 93 %
FEV6-Post: 2.52 L
FEV6-Pre: 2.47 L
FEV6FVC-%Change-Post: 0 %
FEV6FVC-%Pred-Post: 105 %
FEV6FVC-%Pred-Pre: 104 %
FVC-%Change-Post: 1 %
FVC-%Pred-Post: 91 %
FVC-%Pred-Pre: 89 %
FVC-Post: 2.53 L
FVC-Pre: 2.48 L
Post FEV1/FVC ratio: 81 %
Post FEV6/FVC ratio: 100 %
Pre FEV1/FVC ratio: 78 %
Pre FEV6/FVC Ratio: 99 %

## 2015-04-03 NOTE — Progress Notes (Signed)
PFT done today. 

## 2015-04-03 NOTE — Progress Notes (Signed)
Subjective:    Patient ID: Theresa [name], female    DOB: 1946-11-21, 69 y.o.   MRN: [id number]  HPI 69 yo former smoker, hx of Stage 1 breast CA ('02) and a recently dx R LE DVT + PE in 06/2014. She was started on xarelto for therapeutic anticoagulation.  She underwent a hypercoag panel in the setting of her clot and rx, low Prot C and ATIII levels (significance unclear).   01/04/15  Acute OV  Pt presents for an acute office visit.  Was seen by GI today for ongoing evaluation for nausea.  She was referred to our office for work in visit due to dyspnea. She is accompanied by her husband who says she gets these episodes where she gets very winded and starts breathing very fast. She is somewhat anxious and hyperventilating. O2 sats 100% on arrival on her chroinc O2 at 2l/m . HR reg at 95. EKG showed NSR , nonspecific changes. No sign change from last EKG . Recent stress test 12/15/14 was nml, w/ no nml EF and no ischemia noted.  She has been having these episodes on/off. Had repeat CT angio chest 11/08/14 taht showed no acute findings, prev. Sequela of prior thromboembolic dz, no recurrent acute PE.  She has been under a lot of stress lately and has had several workup for nausea and tachycardia.  Seen by endocrinology currently undergoing workup for adrenal insufficiency . Was r/out for pheochromocytoma.  She says she is compliant with her Xarelto.  No chest pain, exertional chest pain, calf pain. No hemoptysis .  She does admit to drinking ETOH daily w/ more than moderate use. It does seem to be an issue with daily living as she fell few days ago with bruising to scalp/forehead.  Denies HA, visual change, ext weakness or confusion.  We talked about cutting back and quitting, advised of dangers of etoh and her meds plus falls on Xarelto .   After a while of talking her breathing returned to normal and hyperventilation resolved. Says she is talking with a pyschiatrist .     Acute OV  03/01/15  --  Returns for persistent symptoms of DOE.  She is not using her O2 as much , only as needed.  Today in office O2 sats 95% on RA walking.  She does need 2 person assit with cane to walk as she has significant weakness and dizziness.  B/p low today in office 90/70.  She has underwent an extensive workup for multiple somatic complaints with PCP, cardiology , pulmonary, GI and Pschy following pt.  Recent stress test 12/15/14 was nml, w/ no nml EF and no ischemia noted.   Had repeat CT angio chest 11/08/14 taht showed no acute findings, prev. Sequela of prior thromboembolic dz, no recurrent acute PE.  She was referred to cardiology last ov , with an extensive evaluation .  She was changed off metoprolol d/t hypotension to [place] (for tachycardia)  Holter showed Sinus Tachycardia , PVC (4000 in 24hr)  Prev echo showed nml EF, gr 1 DD , nml PAP , nml RV   She is a former smoker was recommended for PFT last ov ,  She scheduled them later than requested, we discussed the importance of this test, They are set up for next month with follow up ov with Dr. Albertine Patricia does not drink fluids.as she should, little water intake.  Still drinks heavy etoh daily . We discussed cessation.   She was referred  to cardiopulm rehab, insurance would not cover, sent to pulm rehab but says she was too weak. Sent to PT rehab OP . She has first ov today.   ROV 04/03/15 -- follow up visit, my first visit with her. 69 yo woman with hx tobacco use,  Hx of remote Breast CA, VTE and PE in 7/'15, seen for exertional dyspnea. Her breathing has been stable but not her pre-PE baseline. She has been quite sedentary, but is trying to restart her exercise routine. She went to pulmonary rehab but collapsed there due to unsteadiness, was unable to complete. p     Review of Systems  Constitutional: Negative for fever and unexpected weight change.  HENT: Negative for congestion, dental problem, ear pain, nosebleeds, postnasal drip,  rhinorrhea, sinus pressure, sneezing, sore throat and trouble swallowing.   Eyes: Negative for redness and itching.  Respiratory: Positive for shortness of breath. Negative for cough, chest tightness and wheezing.   Cardiovascular: Negative for palpitations and leg swelling.  Gastrointestinal: Negative for nausea and vomiting.  Genitourinary: Negative for dysuria.  Skin: Negative for rash.  Neurological: Negative for headaches.  Hematological: Does not bruise/bleed easily.  Psychiatric/Behavioral: Positive for dysphoric mood. The patient is nervous/anxious.        Objective:   Physical Exam Filed Vitals:   04/03/15 1637  BP: 110/72  Pulse: 99  Height: 5\' 2"  (1.575 m)  Weight: 157 lb (71.215 kg)  SpO2: 92%    Gen: Pleasant,obese , anxious-but less than last ov  ENT: No lesions,  mouth clear,  oropharynx clear, no postnasal drip  Neck: No JVD, no TMG, no carotid bruits  Lungs: No use of accessory muscles, no dullness to percussion, clear without rales or rhonchi,    Cardiovascular: RRR, heart sounds normal, no murmur or gallops, tr peripheral edema  Musculoskeletal: No deformities, no cyanosis or clubbing  Neuro: alert, non focal  Skin: Warm, no lesions or rashes    CXR 01/04/15  FINDINGS: There is scarring in the left base. There is no edema or consolidation. The heart size and pulmonary vascularity are within normal limits. No adenopathy. No bone lesions.  IMPRESSION: Scarring left base. No edema or consolidation.        Assessment & Plan:

## 2015-04-03 NOTE — Patient Instructions (Signed)
We will continue your anticoagulation until at least July 2016. At that time we will assess to see if we believe it is safe to come off. We will discuss this with Dr. Julien Nordmann Please continue your neurology rehabilitation. When you are ready to graduate from this please let her office know so that we can talk about arranging for pulmonary rehabilitation.  Follow with Dr Lamonte Sakai in 6 months or sooner if you have any problems

## 2015-04-04 ENCOUNTER — Ambulatory Visit: Payer: Medicare Other | Admitting: Physical Therapy

## 2015-04-05 ENCOUNTER — Ambulatory Visit: Payer: Medicare Other | Admitting: Physical Therapy

## 2015-04-09 ENCOUNTER — Encounter: Payer: Self-pay | Admitting: Physician Assistant

## 2015-04-09 ENCOUNTER — Other Ambulatory Visit (INDEPENDENT_AMBULATORY_CARE_PROVIDER_SITE_OTHER): Payer: Medicare Other

## 2015-04-09 ENCOUNTER — Ambulatory Visit (INDEPENDENT_AMBULATORY_CARE_PROVIDER_SITE_OTHER): Payer: Medicare Other | Admitting: Physician Assistant

## 2015-04-09 VITALS — BP 102/70 | HR 100 | Ht 61.5 in | Wt 155.2 lb

## 2015-04-09 DIAGNOSIS — R634 Abnormal weight loss: Secondary | ICD-10-CM

## 2015-04-09 DIAGNOSIS — R110 Nausea: Secondary | ICD-10-CM

## 2015-04-09 DIAGNOSIS — I70219 Atherosclerosis of native arteries of extremities with intermittent claudication, unspecified extremity: Secondary | ICD-10-CM | POA: Diagnosis not present

## 2015-04-09 DIAGNOSIS — R630 Anorexia: Secondary | ICD-10-CM

## 2015-04-09 DIAGNOSIS — R1011 Right upper quadrant pain: Secondary | ICD-10-CM | POA: Diagnosis not present

## 2015-04-09 DIAGNOSIS — R140 Abdominal distension (gaseous): Secondary | ICD-10-CM

## 2015-04-09 LAB — BASIC METABOLIC PANEL
BUN: 6 mg/dL (ref 6–23)
CALCIUM: 8.8 mg/dL (ref 8.4–10.5)
CO2: 25 mEq/L (ref 19–32)
CREATININE: 0.81 mg/dL (ref 0.40–1.20)
Chloride: 101 mEq/L (ref 96–112)
GFR: 74.38 mL/min (ref >60.00)
Glucose, Bld: 93 mg/dL (ref 70–99)
POTASSIUM: 3.6 meq/L (ref 3.5–5.1)
Sodium: 138 mEq/L (ref 135–145)

## 2015-04-09 MED ORDER — OMEPRAZOLE 20 MG PO CPDR: 20.0000 mg | DELAYED_RELEASE_CAPSULE | Freq: Every day | ORAL | Status: DC

## 2015-04-09 MED ORDER — ONDANSETRON 4 MG PO TBDP: 4.0000 mg | ORAL_TABLET | Freq: Two times a day (BID) | ORAL | Status: DC

## 2015-04-09 NOTE — Patient Instructions (Signed)
Please go to the basement level to have your labs drawn.   We sent prescriptions to [facility] [address]  1. Omeprazole 20 mg 2. Zofran 4 mg   We scheduled the MRI of Abdomen and pelvis at [facility]. Date is 04-30-2015  Time:  Arrive at 11:25 AM. Appointment is for 11:45 am. Do not have anything for 4 hours prior to the appointment. If you need a small amount of water you can have that.  Wear no metal jewlery.

## 2015-04-09 NOTE — Progress Notes (Signed)
Patient ID: Theresa Theresa [name], female   DOB: 1946/06/04, 69 y.o.   MRN: [id number]   Subjective:    Patient ID: Theresa Theresa [name], female    DOB: 11/08/1946, 69 y.o.   MRN: [id number]  HPI Theresa Theresa [name] is a pleasant 69 year old white female known to Dr. Deatra Ina. She was last seen about 6 weeks ago with complaints of abdominal discomfort and ongoing nausea. She has been having her current symptoms for about 5 months. Dr. Deatra Ina felt her symptoms may be secondary to polypharmacy. She had Theresa Theresa [name] last undergone an EGD in 2012 with dilation of esophageal stricture and also had colonoscopy in 2012 which was normal. Is status post cholecystectomy, has history of breast cancer, history of DVT and PE in July 2015 and is on Theresa Theresa [name] toe. Also with history of EtOH use/chronically alt all somatic complaints and deconditioning with chronic respiratory failure. She is on oxygen at night. She had undergone CT of the abdomen and pelvis in December 2015 for same complaints and this showed diffuse hepatic steatosis and severe degenerative changes in her lumbar spine moderate hiatal hernia. She is unable to do a gastric emptying scan because she attempted this one several years ago and gagged on the egg and cannot even think about swallowing and 8 at this Theresa [name]. She also feels that she can't do a barium study because she can't keep down the barium. She has claustrophobia and cannot go on a closed MRI. She does have a prescription for Zofran which she does find somewhat helpful. She says her nausea is always worse first thing in the morning and early in the day and not as bad later in the day. She doesn't give any definite symptoms of early satiety just says she has no appetite. She feels nauseated but does not vomit has no current complaints of dysphagia. She has lost some weight but is uncertain of the amount of Weight is actually stable by our scales over the past 6 weeks. She also complains of constipation alternating with  diarrhea.  Review of Systems Pertinent positive and negative review of systems were noted in the above HPI section.  All other review of systems was otherwise negative.  Outpatient Encounter Prescriptions as of 04/09/2015  . Order #: [id number] Class: Historical Med  . Order #: [id number] Class: Historical Med  . Order #: [id number] Class: Historical Med  . Order #: [id number] Class: Historical Med  . Order #: [id number] Class: Normal  . Order #: [id number] Class: Normal  . Order #: [id number] Class: Historical Med  . Order #: [id number] Class: Historical Med  . Order #: [id number] Class: Historical Med  . [DISCONTINUED] Order #: [id number] Class: Normal  . Order #: [id number] Class: Normal  . [DISCONTINUED] Order #: [id number] Class: Normal   Allergies  Allergen Reactions  . Ciprofloxacin Hives   Patient Active Problem List   Diagnosis Date Noted  . Dyspnea and respiratory abnormality 01/04/2015  . Chronic nausea 12/27/2014  . Cough 12/27/2014  . Hypoxia 11/08/2014  . Dyspnea 11/08/2014  . Hematemesis 11/08/2014  . History of pulmonary embolism 11/08/2014  . Alcohol abuse 11/08/2014  . SVT (supraventricular tachycardia) 11/08/2014  . Acute pulmonary embolus 10/25/2014  . Nausea with vomiting 09/12/2014  . Chronic respiratory failure 06/17/2014  . Elevated TSH 06/17/2014  . Hypokalemia 06/17/2014  . Pulmonary embolus 06/15/2014  . Acute respiratory failure with hypoxia 06/15/2014  . Chronic venous insufficiency 10/26/2013  . Closed fracture of 5th metacarpal 05/02/2013  . Fall 05/02/2013  .  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 Sepsis secondary to pyelonphritis / gram negative rod (E. Coli) bacteremia 06/23/2012  . Leukocytosis 06/22/2012  . COLITIS 06/20/2010  . Malignant neoplasm of female breast 02/05/2009  . HYPERLIPIDEMIA 02/05/2009  . ANXIETY DEPRESSION 02/05/2009  . ESOPHAGEAL STRICTURE 02/05/2009  . GERD 02/05/2009    History   Social History  . Marital Status: Married    Spouse Name: N/A  . Number of Children: 2  . Years of Education: N/A   Occupational History  . Unemployed    Social History Main Topics  . Smoking status: Former Smoker -- 1.50 packs/day for 38 years    Types: Cigarettes    Quit date: 12/08/1998  . Smokeless tobacco: Never Used  . Alcohol Use: 27.6 oz/week    32 Shots of liquor, 14 Standard drinks or equivalent per week     Comment: 11/08/2014 "2-3, 1 1/2shot drinks/night"  . Drug Use: No  . Sexual Activity: Yes    Birth Control/ Protection: None   Other Topics Concern  . Not on file   Social History Narrative    Ms. Theresa [name]'s family history includes Deep vein thrombosis in her daughter; Heart attack in her father; Heart disease in her father; Lung cancer in her maternal grandfather. There is no history of Colon cancer.      Objective:    Filed Vitals:   04/09/15 0958  BP: 102/70  Pulse: 100    Physical Exam  well-developed older white female in no acute distress accompanied by her husband blood pressure 102/70 pulse 100 height 5 foot 1 weight 155. HEENT ;nontraumatic normocephalic EOMI PERRLA sclera anicteric Neck; supple no JVD, Cardiovascular; regular rate and rhythm with S1-S2 no murmur rub or gallop, Pulmonary ;clear bilaterally, Abdomen ;soft she has mild rather generalized tenderness more prominent across the upper abdomen no guarding or rebound no palpable mass or hepatosplenomegaly bowel sounds are present cholecystectomy scar,, Rectal; exam not done, ext; no clubbing cyanosis or edema skin warm and dry she ambulates with difficulty, Neuropsych; mood and affect appropriate though she looks to her husband frequently for help with answers       Assessment & Plan:   #1 69 yo female with persistent nausea, lack of appetite-etiology not clear, associated with upper abdominal discomfort. Negative Ct 5 months ago, higher risk for sedation with O2 use and  currently anticoagulated Unable to tolerate barium, or egg for GE scan #2 Hx PE and DVT 06/2014 #3 chronic anticoagulation- pn Xarelto #4 ETOH  #5hx breast Ca #5 chronic respiratory failure/nocturnal O2  #6 s/p GB #7 GERD  Plan; Continue Zofran 4 mg ODT -take early am and second dose mid afternoon daily Add prilosec 20 m daily  Schedule for MRI abd/pelvis May need EGD  IF MRI and EGD negative will need imaging  of head.     Kloi Brodman S Merridy Pascoe PA-C 04/09/2015   Cc: Lona Kettle, MD

## 2015-04-10 ENCOUNTER — Other Ambulatory Visit: Payer: Self-pay

## 2015-04-10 NOTE — Progress Notes (Signed)
Reviewed and agree with management. Robert D. Kaplan, M.D., FACG  

## 2015-04-18 ENCOUNTER — Encounter: Payer: Self-pay | Admitting: Adult Health

## 2015-04-19 ENCOUNTER — Ambulatory Visit: Payer: Medicare Other | Attending: Cardiology | Admitting: Physical Therapy

## 2015-04-19 DIAGNOSIS — R5381 Other malaise: Secondary | ICD-10-CM | POA: Diagnosis not present

## 2015-04-19 DIAGNOSIS — R42 Dizziness and giddiness: Secondary | ICD-10-CM | POA: Diagnosis not present

## 2015-04-19 DIAGNOSIS — R6889 Other general symptoms and signs: Secondary | ICD-10-CM

## 2015-04-19 DIAGNOSIS — R269 Unspecified abnormalities of gait and mobility: Secondary | ICD-10-CM | POA: Diagnosis present

## 2015-04-20 ENCOUNTER — Encounter: Payer: Self-pay | Admitting: Physical Therapy

## 2015-04-20 NOTE — Therapy (Signed)
[facility] [address] [place], [place], [address] Phone: [phone]   Fax:  [phone]  Physical Therapy Treatment  Patient Details  Name: Theresa [name] MRN: [id number] Date of Birth: 12-17-1945 Referring Provider:  Lona Kettle, MD  Encounter Date: 04/19/2015      PT End of Session - 04/20/15 1831    Visit Number 14   Number of Visits 17   Date for PT Re-Evaluation 04/22/15   Authorization Type Medicare   Authorization Time Period 02-21-15 - 04-22-15   PT Start Time 1534   PT Stop Time 1623   PT Time Calculation (min) 49 min   Equipment Utilized During Treatment Gait belt      Past Medical History  Diagnosis Date  . Esophageal stricture   . HLD (hyperlipidemia)   . Dysthymic disorder   . Other and unspecified coagulation defects   . Diaphragmatic hernia without mention of obstruction or gangrene   . PAD (peripheral artery disease)   . Pulmonary embolism 06/2014    "both lungs"  . DVT (deep venous thrombosis) 06/2014    RLE  . GERD (gastroesophageal reflux disease)   . History of hiatal hernia   . Anxiety   . Malignant neoplasm of breast (female), unspecified site 2002    "right", NO BLOOD PRESSURES OR STICKS IN RIGHT ARM  . SVT (supraventricular tachycardia) 11/07/2014  . On home oxygen therapy     "2L at night and prn" (11/08/2014)  . Atherosclerosis   . Alcohol abuse   . Chronic respiratory failure   . Colitis     Past Surgical History  Procedure Laterality Date  . Appendectomy  12/1977  . Cholecystectomy  12/1977  . Breast lumpectomy Right 01/2001  . Tubal ligation  1980's  . Cardiac catheterization  1990's  . Esophagogastroduodenoscopy (egd) with esophageal dilation  X 7    There were no vitals filed for this visit.  Visit Diagnosis:  Physical deconditioning  Abnormality of gait  Decreased activity tolerance      Subjective Assessment - 04/20/15 1826    Subjective Pt. states  she is doing much better - just got back in town yesterday from [place] to visit grandchildren   Pertinent History PE's in bil. lungs summer of 2015; tachycardia??; orthostatic hypotension;    Patient Stated Goals improve balance so able to return to pulmonary rehab   Currently in Pain? No/denies                         [facility] Adult PT Treatment/Exercise - 04/20/15 0001    Transfers   Sit to Stand 5: Supervision;With upper extremity assist  from mat table   Sit to Stand Details (indicate cue type and reason) on floor - from 18" mat surface   Ambulation/Gait   Ambulation/Gait Yes   Ambulation/Gait Assistance 5: Supervision   Ambulation Distance (Feet) 360 Feet   Assistive device Straight cane  used tripod based cane for 120'   Gait Pattern Step-through pattern   Ambulation Surface Level;Indoor   Knee/Hip Exercises: Standing   Heel Raises 1 set;10 reps  UE support   Forward Step Up Both;10 reps   Rocker Board 1 minute     NeuroRe-ed; alternate tap ups to 6" step with minimal UE support; alternate stepping on incline with mod hand held assist x  10 reps each; step ups to 6" step RLE x 10 reps and LLE x 10 reps TUG  score with RW 17.1 secs   Discussed benefits of straight cane vs. Tripod based cane and informed pt as to why she did not need to use quad cane         PT Education - 04/20/15 1830    Education provided Yes   Education Details informed pt and husband that she is ready to transition to pulmonary rehab   Person(s) Educated Patient;Spouse   Methods Explanation   Comprehension Verbalized understanding          PT Short Term Goals - 03/26/15 1714    PT SHORT TERM GOAL #1   Title Pt. will improve Berg score to >/=  28 to demo imporved standing balance.  (03-23-15)   Baseline 31/56 on 03-26-15   Status Achieved   PT SHORT TERM GOAL #2   Title Improve TUG score to </= 25 secs to demo imrpoved functional mobility     (03-23-15)   Baseline 20.28 secs  with RW; 18.00 secs with RW   Status Achieved   PT SHORT TERM GOAL #3   Title Incr. gait velocity to >/= 2.0 ft/sec with RW for incr. gait efficiency  (03-23-15)   Baseline 12.69; 12.25    2.58 ft/sec with RW 03-26-15   Status Achieved   PT SHORT TERM GOAL #4   Title Independent in HEP for balance and strengthening  (03-23-15)   Baseline pt. not performing consistently 03-26-15   Status On-going           PT Long Term Goals - 03/15/15 1400    PT LONG TERM GOAL #1   Title Incr. Berg balance test score to >/= 36/56 to decr. fall risk  (04-22-15)   Status On-going   PT LONG TERM GOAL #2   Title Improve TUG score to </= 20 secs with RW for improved functional mobility  (04-22-15)   Status On-going   PT LONG TERM GOAL #3   Title Incr. gait velocity to >/= 2.3 ft/sec with RW for incr. gait efficiency  (04-22-15)   Status On-going   PT LONG TERM GOAL #4   Title Increase FOTO score to >/=60/100 to demo imrpoved pt satisfaction  (04-22-15)   Status On-going               Plan - 04/20/15 1832    Clinical Impression Statement Pt. improving with balance and gait - TUG score 17.2 secs with RW today - pt. met LTG #2; ready to transition to pulmonary rehab now that balance is much improved   Pt will benefit from skilled therapeutic intervention in order to improve on the following deficits Abnormal gait;Cardiopulmonary status limiting activity;Decreased endurance;Decreased activity tolerance;Decreased balance;Decreased knowledge of use of DME;Decreased mobility;Decreased strength;Other (comment)   Rehab Potential Good   PT Frequency 2x / week   PT Duration 4 weeks   PT Treatment/Interventions ADLs/Self Care Home Management;Therapeutic activities;Patient/family education;DME Instruction;Therapeutic exercise;Gait training;Balance training;Stair training;Neuromuscular re-education;Energy conservation;Functional mobility training   PT Next Visit Plan check LTG's and renew   PT Home Exercise Plan  see above   Consulted and Agree with Plan of Care Patient   Family Member Consulted spouse-Dick        Problem List Patient Active Problem List   Diagnosis Date Noted  . Dyspnea and respiratory abnormality 01/04/2015  . Chronic nausea 12/27/2014  . Cough 12/27/2014  . Hypoxia 11/08/2014  . Dyspnea 11/08/2014  . Hematemesis 11/08/2014  . History of pulmonary embolism 11/08/2014  . Alcohol abuse 11/08/2014  .  SVT (supraventricular tachycardia) 11/08/2014  . Acute pulmonary embolus 10/25/2014  . Nausea with vomiting 09/12/2014  . Chronic respiratory failure 06/17/2014  . Elevated TSH 06/17/2014  . Hypokalemia 06/17/2014  . Pulmonary embolus 06/15/2014  . Acute respiratory failure with hypoxia 06/15/2014  . Chronic venous insufficiency 10/26/2013  . Closed fracture of 5th metacarpal 05/02/2013  . Fall 05/02/2013  . 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 Sepsis secondary to pyelonphritis / gram negative rod (E. Coli) bacteremia 06/23/2012  . Leukocytosis 06/22/2012  . COLITIS 06/20/2010  . Malignant neoplasm of female breast 02/05/2009  . HYPERLIPIDEMIA 02/05/2009  . ANXIETY DEPRESSION 02/05/2009  . ESOPHAGEAL STRICTURE 02/05/2009  . GERD 02/05/2009    Alda Lea, PT 04/20/2015, 6:37 PM  [facility] [address] [place], [place], [address] Phone: [phone]   Fax:  [phone]

## 2015-04-23 ENCOUNTER — Ambulatory Visit: Payer: Medicare Other | Admitting: Physical Therapy

## 2015-04-23 DIAGNOSIS — R6889 Other general symptoms and signs: Secondary | ICD-10-CM

## 2015-04-23 DIAGNOSIS — R269 Unspecified abnormalities of gait and mobility: Secondary | ICD-10-CM

## 2015-04-23 NOTE — Therapy (Signed)
[facility] [address] [place] , [place], [address] Phone: [phone]   Fax:  [phone]  Physical Therapy Treatment  Patient Details  Name: Theresa [name] MRN: [id number] Date of Birth: [birth year] Referring Provider:  Lona Kettle, MD  Encounter Date: 04/23/2015      PT End of Session - 04/23/15 1118    Authorization Type Medicare   Authorization Time Period 02-21-15 - 04-22-15      Past Medical History  Diagnosis Date  . Esophageal stricture   . HLD (hyperlipidemia)   . Dysthymic disorder   . Other and unspecified coagulation defects   . Diaphragmatic hernia without mention of obstruction or gangrene   . PAD (peripheral artery disease)   . Pulmonary embolism 06/2014    "both lungs"  . DVT (deep venous thrombosis) 06/2014    RLE  . GERD (gastroesophageal reflux disease)   . History of hiatal hernia   . Anxiety   . Malignant neoplasm of breast (female), unspecified site 2002    "right", NO BLOOD PRESSURES OR STICKS IN RIGHT ARM  . SVT (supraventricular tachycardia) 11/07/2014  . On home oxygen therapy     "2L at night and prn" (11/08/2014)  . Atherosclerosis   . Alcohol abuse   . Chronic respiratory failure   . Colitis     Past Surgical History  Procedure Laterality Date  . Appendectomy  12/1977  . Cholecystectomy  12/1977  . Breast lumpectomy Right 01/2001  . Tubal ligation  1980's  . Cardiac catheterization  1990's  . Esophagogastroduodenoscopy (egd) with esophageal dilation  X 7    There were no vitals filed for this visit.  Visit Diagnosis:  Decreased activity tolerance  Abnormality of gait    Pt. Arrived for scheduled PT appt. but c/o nausea and not feeling well in clinic lobby - became nauseas and almost Vomited in lobby just prior to ambulate back to clinic gym for PT session - Arrived - NO CHARGE                             PT Short Term Goals - 03/26/15  1714    PT SHORT TERM GOAL #1   Title Pt. will improve Berg score to >/=  28 to demo imporved standing balance.  (03-23-15)   Baseline 31/56 on 03-26-15   Status Achieved   PT SHORT TERM GOAL #2   Title Improve TUG score to </= 25 secs to demo imrpoved functional mobility     (03-23-15)   Baseline 20.28 secs with RW; 18.00 secs with RW   Status Achieved   PT SHORT TERM GOAL #3   Title Incr. gait velocity to >/= 2.0 ft/sec with RW for incr. gait efficiency  (03-23-15)   Baseline 12.69; 12.25    2.58 ft/sec with RW 03-26-15   Status Achieved   PT SHORT TERM GOAL #4   Title Independent in HEP for balance and strengthening  (03-23-15)   Baseline pt. not performing consistently 03-26-15   Status On-going           PT Long Term Goals - 03/15/15 1400    PT LONG TERM GOAL #1   Title Incr. Berg balance test score to >/= 36/56 to decr. fall risk  (04-22-15)   Status On-going   PT LONG TERM GOAL #2   Title Improve TUG score to </= 20 secs with RW for improved functional mobility  (  04-22-15)   Status On-going   PT LONG TERM GOAL #3   Title Incr. gait velocity to >/= 2.3 ft/sec with RW for incr. gait efficiency  (04-22-15)   Status On-going   PT LONG TERM GOAL #4   Title Increase FOTO score to >/=60/100 to demo imrpoved pt satisfaction  (04-22-15)   Status On-going               Problem List Patient Active Problem List   Diagnosis Date Noted  . Dyspnea and respiratory abnormality 01/04/2015  . Chronic nausea 12/27/2014  . Cough 12/27/2014  . Hypoxia 11/08/2014  . Dyspnea 11/08/2014  . Hematemesis 11/08/2014  . History of pulmonary embolism 11/08/2014  . Alcohol abuse 11/08/2014  . SVT (supraventricular tachycardia) 11/08/2014  . Acute pulmonary embolus 10/25/2014  . Nausea with vomiting 09/12/2014  . Chronic respiratory failure 06/17/2014  . Elevated TSH 06/17/2014  . Hypokalemia 06/17/2014  . Pulmonary embolus 06/15/2014  . Acute respiratory failure with hypoxia 06/15/2014   . Chronic venous insufficiency 10/26/2013  . Closed fracture of 5th metacarpal 05/02/2013  . Fall 05/02/2013  . 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 Sepsis secondary to pyelonphritis / gram negative rod (E. Coli) bacteremia 06/23/2012  . Leukocytosis 06/22/2012  . COLITIS 06/20/2010  . Malignant neoplasm of female breast 02/05/2009  . HYPERLIPIDEMIA 02/05/2009  . ANXIETY DEPRESSION 02/05/2009  . ESOPHAGEAL STRICTURE 02/05/2009  . GERD 02/05/2009    Alda Lea, PT 04/23/2015, 11:20 AM  [facility] [address] [place], [place], [address] Phone: [phone]   Fax:  [phone]

## 2015-04-26 ENCOUNTER — Ambulatory Visit: Payer: Medicare Other | Admitting: Physical Therapy

## 2015-04-26 DIAGNOSIS — R269 Unspecified abnormalities of gait and mobility: Secondary | ICD-10-CM

## 2015-04-26 DIAGNOSIS — R6889 Other general symptoms and signs: Secondary | ICD-10-CM

## 2015-04-27 ENCOUNTER — Encounter: Payer: Self-pay | Admitting: Physical Therapy

## 2015-04-27 NOTE — Therapy (Signed)
[facility] [address] [place], [place], [address] Phone: [phone]   Fax:  [phone]  Physical Therapy Treatment  Patient Details  Name: Theresa [name] MRN: [id number] Date of Birth: 02/03/1946 Referring Provider:  Lona Kettle, MD  Encounter Date: 04/26/2015      PT End of Session - 04/27/15 1008    Visit Number 15  G5   Number of Visits 21   Date for PT Re-Evaluation 05/27/15   Authorization Type Medicare   Authorization Time Period 04-26-15 - 06-25-15   PT Start Time 1537   PT Stop Time 1621   PT Time Calculation (min) 44 min   Equipment Utilized During Treatment Gait belt      Past Medical History  Diagnosis Date  . Esophageal stricture   . HLD (hyperlipidemia)   . Dysthymic disorder   . Other and unspecified coagulation defects   . Diaphragmatic hernia without mention of obstruction or gangrene   . PAD (peripheral artery disease)   . Pulmonary embolism 06/2014    "both lungs"  . DVT (deep venous thrombosis) 06/2014    RLE  . GERD (gastroesophageal reflux disease)   . History of hiatal hernia   . Anxiety   . Malignant neoplasm of breast (female), unspecified site 2002    "right", NO BLOOD PRESSURES OR STICKS IN RIGHT ARM  . SVT (supraventricular tachycardia) 11/07/2014  . On home oxygen therapy     "2L at night and prn" (11/08/2014)  . Atherosclerosis   . Alcohol abuse   . Chronic respiratory failure   . Colitis     Past Surgical History  Procedure Laterality Date  . Appendectomy  12/1977  . Cholecystectomy  12/1977  . Breast lumpectomy Right 01/2001  . Tubal ligation  1980's  . Cardiac catheterization  1990's  . Esophagogastroduodenoscopy (egd) with esophageal dilation  X 7    There were no vitals filed for this visit.  Visit Diagnosis:  Abnormality of gait - Plan: PT plan of care cert/re-cert  Decreased activity tolerance - Plan: PT plan of care cert/re-cert      Subjective  Assessment - 04/27/15 1000    Subjective Pt. states she is feeling better today - came Monday but was feeling sick and nauseaus and cancelled PT appt; reports she has a MRI scheduled on Monday, 5-21   Pertinent History PE's in bil. lungs summer of 2015; tachycardia??; orthostatic hypotension;    Patient Stated Goals improve balance so able to return to pulmonary rehab   Currently in Pain? No/denies                         [facility] Adult PT Treatment/Exercise - 04/27/15 0001    Transfers   Sit to Stand 5: Supervision;With upper extremity assist  from mat table   Ambulation/Gait   Ambulation/Gait Yes   Ambulation/Gait Assistance 5: Supervision   Ambulation Distance (Feet) 275 Feet   Assistive device Straight cane  used tripod based cane    Gait Pattern Step-through pattern   Ambulation Surface Level;Indoor   Static Standing Balance   Static Standing - Balance Support No upper extremity supported             Balance Exercises - 04/27/15 1005    Balance Exercises: Standing   Standing Eyes Opened Wide (BOA);Solid surface;1 rep;Time  60 seconds; also feet together 30 seconds min guard/superv   SLS Eyes open;1 rep;10 secs;Upper extremity support 2  Rockerboard Anterior/posterior;EO;20 seconds   Step Ups Forward;6 inch;UE support 2  10 reps each LE   Sidestepping 2 reps;Upper extremity support  inside bars   Other Standing Exercises stepping over and back of 1/2 bolster on floor with UE support prn for improved SLS             PT Short Term Goals - 03/26/15 1714    PT SHORT TERM GOAL #1   Title Pt. will improve Berg score to >/=  28 to demo imporved standing balance.  (03-23-15)   Baseline 31/56 on 03-26-15   Status Achieved   PT SHORT TERM GOAL #2   Title Improve TUG score to </= 25 secs to demo imrpoved functional mobility     (03-23-15)   Baseline 20.28 secs with RW; 18.00 secs with RW   Status Achieved   PT SHORT TERM GOAL #3   Title Incr. gait  velocity to >/= 2.0 ft/sec with RW for incr. gait efficiency  (03-23-15)   Baseline 12.69; 12.25    2.58 ft/sec with RW 03-26-15   Status Achieved   PT SHORT TERM GOAL #4   Title Independent in HEP for balance and strengthening  (03-23-15)   Baseline pt. not performing consistently 03-26-15   Status On-going           PT Long Term Goals - 04/27/15 1012    PT LONG TERM GOAL #1   Title Incr. Berg balance test score to >/= 36/56 to decr. fall risk  (05-24-15)   Baseline 31/56 on 03-26-15   Time 4   Period Weeks   Status On-going   PT LONG TERM GOAL #2   Title Improve TUG score to </= 20 secs with RW for improved functional mobility  (04-22-15)   Baseline 18 secs with RW  on 03-26-15   Time 4   Period Weeks   Status On-going   PT LONG TERM GOAL #3   Title Incr. gait velocity to >/= 2.3 ft/sec with RW for incr. gait efficiency  (04-22-15)   Baseline 2.58 ft/sec with RW on 03-26-15   Status Achieved   PT LONG TERM GOAL #4   Title Increase FOTO score to >/=60/100 to demo imrpoved pt satisfaction  (04-22-15)   Baseline 45/100   Time 4   Period Weeks   Status On-going   PT LONG TERM GOAL #5   Title Amb. household amb. with cane modified independently   Baseline pt using RW as of 04-26-15   Time 4   Period Weeks   Status New               Problem List Patient Active Problem List   Diagnosis Date Noted  . Dyspnea and respiratory abnormality 01/04/2015  . Chronic nausea 12/27/2014  . Cough 12/27/2014  . Hypoxia 11/08/2014  . Dyspnea 11/08/2014  . Hematemesis 11/08/2014  . History of pulmonary embolism 11/08/2014  . Alcohol abuse 11/08/2014  . SVT (supraventricular tachycardia) 11/08/2014  . Acute pulmonary embolus 10/25/2014  . Nausea with vomiting 09/12/2014  . Chronic respiratory failure 06/17/2014  . Elevated TSH 06/17/2014  . Hypokalemia 06/17/2014  . Pulmonary embolus 06/15/2014  . Acute respiratory failure with hypoxia 06/15/2014  . Chronic venous insufficiency  10/26/2013  . Closed fracture of 5th metacarpal 05/02/2013  . Fall 05/02/2013  . 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 Sepsis secondary to pyelonphritis / gram negative rod (E. Coli) bacteremia 06/23/2012  . Leukocytosis 06/22/2012  .  COLITIS 06/20/2010  . Malignant neoplasm of female breast 02/05/2009  . HYPERLIPIDEMIA 02/05/2009  . ANXIETY DEPRESSION 02/05/2009  . ESOPHAGEAL STRICTURE 02/05/2009  . GERD 02/05/2009    Alda Lea, PT 04/27/2015, 10:19 AM  [facility] [address] [place], [place], [address] Phone: [phone]   Fax:  [phone]

## 2015-04-30 ENCOUNTER — Ambulatory Visit
Admission: RE | Admit: 2015-04-30 | Discharge: 2015-04-30 | Disposition: A | Discharge status: Home / Self Care | Payer: Medicare Other | Source: Ambulatory Visit | Attending: Physician Assistant | Admitting: Physician Assistant

## 2015-04-30 DIAGNOSIS — R634 Abnormal weight loss: Secondary | ICD-10-CM

## 2015-04-30 DIAGNOSIS — R140 Abdominal distension (gaseous): Secondary | ICD-10-CM

## 2015-04-30 DIAGNOSIS — R1011 Right upper quadrant pain: Secondary | ICD-10-CM

## 2015-04-30 DIAGNOSIS — R630 Anorexia: Secondary | ICD-10-CM

## 2015-04-30 DIAGNOSIS — R110 Nausea: Secondary | ICD-10-CM

## 2015-04-30 MED ORDER — GADOBENATE DIMEGLUMINE 529 MG/ML IV SOLN
14.0000 mL | Freq: Once | INTRAVENOUS | Status: AC | PRN
  Administered 2015-04-30: 14 mL via INTRAVENOUS

## 2015-05-01 ENCOUNTER — Ambulatory Visit (INDEPENDENT_AMBULATORY_CARE_PROVIDER_SITE_OTHER): Payer: Medicare Other | Admitting: Cardiology

## 2015-05-01 ENCOUNTER — Encounter: Payer: Self-pay | Admitting: Cardiology

## 2015-05-01 VITALS — BP 118/72 | HR 95 | Ht 61.5 in | Wt 152.0 lb

## 2015-05-01 DIAGNOSIS — I471 Supraventricular tachycardia: Secondary | ICD-10-CM

## 2015-05-01 DIAGNOSIS — I872 Venous insufficiency (chronic) (peripheral): Secondary | ICD-10-CM

## 2015-05-01 DIAGNOSIS — R000 Tachycardia, unspecified: Secondary | ICD-10-CM

## 2015-05-01 DIAGNOSIS — R0600 Dyspnea, unspecified: Secondary | ICD-10-CM | POA: Diagnosis not present

## 2015-05-01 DIAGNOSIS — R0609 Other forms of dyspnea: Secondary | ICD-10-CM | POA: Diagnosis not present

## 2015-05-01 DIAGNOSIS — I4711 Inappropriate sinus tachycardia, so stated: Secondary | ICD-10-CM

## 2015-05-01 DIAGNOSIS — I739 Peripheral vascular disease, unspecified: Secondary | ICD-10-CM | POA: Insufficient documentation

## 2015-05-01 DIAGNOSIS — I70219 Atherosclerosis of native arteries of extremities with intermittent claudication, unspecified extremity: Secondary | ICD-10-CM | POA: Diagnosis not present

## 2015-05-01 NOTE — Patient Instructions (Signed)
Medication Instructions:   Your physician recommends that you continue on your current medications as directed. Please refer to the Current Medication list given to you today.      You have been referred to  VASCULAR SURGERY DR Scot Dock FOR PERIPHERAL ARTERY DISEASE/CLAUDICATION     Follow-Up:  Your physician wants you to follow-up in: [place] will receive a reminder letter in the mail two months in advance. If you don't receive a letter, please call our office to schedule the follow-up appointment.

## 2015-05-01 NOTE — Progress Notes (Signed)
Patient ID: RANIAH KARAN, female   DOB: 08-Feb-1946, 69 y.o.   MRN: [id number]     Patient Name: Theresa [name] Date of Encounter: 05/01/2015  Primary Care Provider:   Melinda Crutch, MD Primary Cardiologist:  Dorothy Spark  Problem List   Past Medical History  Diagnosis Date  . Esophageal stricture   . HLD (hyperlipidemia)   . Dysthymic disorder   . Other and unspecified coagulation defects   . Diaphragmatic hernia without mention of obstruction or gangrene   . PAD (peripheral artery disease)   . Pulmonary embolism 06/2014    "both lungs"  . DVT (deep venous thrombosis) 06/2014    RLE  . GERD (gastroesophageal reflux disease)   . History of hiatal hernia   . Anxiety   . Malignant neoplasm of breast (female), unspecified site 2002    "right", NO BLOOD PRESSURES OR STICKS IN RIGHT ARM  . SVT (supraventricular tachycardia) 11/07/2014  . On home oxygen therapy     "2L at night and prn" (11/08/2014)  . Atherosclerosis   . Alcohol abuse   . Chronic respiratory failure   . Colitis    Past Surgical History  Procedure Laterality Date  . Appendectomy  12/1977  . Cholecystectomy  12/1977  . Breast lumpectomy Right 01/2001  . Tubal ligation  1980's  . Cardiac catheterization  1990's  . Esophagogastroduodenoscopy (egd) with esophageal dilation  X 7   Allergies  Allergies  Allergen Reactions  . Ciprofloxacin Hives   Chief complain: Fatigue, DOE, dizziness, claudications  HPI  69 year old female with history of DVT and pulmonary embolism in June of last year with significant hypoxia that lead to home O2 therapy and chronic therapy with Xarelto. The patient presented to the ER on 11/16/2014 with shortness of breath abdominal pain nausea and hemoptysis. She has also been complaining of palpitations. Abdominal CT didn't show any abnormalities. 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 TSH was normal and in her records state that she had elevated catecholamines but  there is no on evidence of that. The patient states that she has palpitations that happen several times a day. Are associated with shortness of breath. The patient has been noticing progressively worsening shortness of breath but no chest pain. No syncope, orthopnea or paroxysmal nocturnal dyspnea. She has also noticed that her heart rate has been elevated for most part of the day. She has never smoked. Her blood pressure is a very low at baseline.  01/09/2015 - the patient is coming after 3 weeks, she was started on metoprolol 25 mg by mouth daily. Her Holter monitor short inappropriate sinus tachycardia with persistent sinus tachycardia for 20 hours during the day. She also had 4000 PVCs on her Holter monitor in 24 hours. She is coming today she states that she feels tired more dizzy and in fact she fell and has a significant bruise on her forehead. She is also experiencing palpitations with dyspnea on exertion, hyperventilation on multiple occasions. She continues to take Xarelto and is compliant with it.  05/01/2015 - 3 months follow up, she was referred to cardiac and pulmonary rehab, however turned down as she was not able to complete the exercises. She is currently undergoing vestibular exercises with improvement of dizziness and very limited exercises with improved strengh, however still very weak, denies CP. No syncope or palpitations. She is complaining of significant claudications. She passed out at the waiting room at the last vascular surgery office visit. She is tolerating Corlanor  well, improved HR, now bellow 100.   Home Medications  Prior to Admission medications   Medication Sig Start Date End Date Taking? Authorizing Provider  ALPRAZolam (XANAX) 0.25 MG tablet Take 0.25 mg by mouth daily.    Yes Historical Provider, MD  CARAFATE 1 GM/10ML suspension Take 1 mL by mouth daily. 11/17/14  Yes Historical Provider, MD  clobetasol ointment (TEMOVATE) 0.05 % Apply 2.70 application topically as  needed. itching 10/09/14  Yes Historical Provider, MD  Desvenlafaxine Succinate ER (PRISTIQ) 25 MG TB24 Take 1 tablet by mouth daily.   Yes Historical Provider, MD  escitalopram (LEXAPRO) 10 MG tablet Take 10 mg by mouth daily. 11/24/14  Yes Historical Provider, MD  ondansetron (ZOFRAN) 4 MG tablet Take 1 tablet (4 mg total) by mouth every 8 (eight) hours as needed for nausea or vomiting. 10/03/14  Yes Inda Castle, MD  ondansetron (ZOFRAN-ODT) 4 MG disintegrating tablet Take 4 mg by mouth as needed. vomiting 11/17/14  Yes Historical Provider, MD  pantoprazole (PROTONIX) 40 MG tablet Take 1 tablet (40 mg total) by mouth daily. 11/09/14  Yes Nishant Dhungel, MD  promethazine (PHENERGAN) 25 MG suppository Place 1 suppository (25 mg total) rectally every 6 (six) hours as needed for nausea or vomiting. 11/20/14  Yes Ernestina Patches, MD  promethazine (PHENERGAN) 25 MG tablet Take 25 mg by mouth as needed. nausea 12/09/14  Yes Historical Provider, MD  rivaroxaban (XARELTO) 20 MG TABS tablet Take 20 mg by mouth daily with supper.   Yes Historical Provider, MD    Family History  Family History  Problem Relation Age of Onset  . Heart disease Father   . Heart attack Father   . Colon cancer Neg Hx   . Lung cancer Maternal Grandfather   . Deep vein thrombosis Daughter     Social History  History   Social History  . Marital Status: Married    Spouse Name: N/A  . Number of Children: 2  . Years of Education: N/A   Occupational History  . Unemployed    Social History Main Topics  . Smoking status: Former Smoker -- 1.50 packs/day for 38 years    Types: Cigarettes    Quit date: 12/08/1998  . Smokeless tobacco: Never Used  . Alcohol Use: 27.6 oz/week    32 Shots of liquor, 14 Standard drinks or equivalent per week     Comment: 11/08/2014 "2-3, 1 1/2shot drinks/night"  . Drug Use: No  . Sexual Activity: Yes    Birth Control/ Protection: None   Other Topics Concern  . Not on file   Social  History Narrative     Review of Systems, as per HPI, otherwise negative General:  No chills, fever, night sweats or weight changes.  Cardiovascular:  No chest pain, dyspnea on exertion, edema, orthopnea, palpitations, paroxysmal nocturnal dyspnea. Dermatological: No rash, lesions/masses Respiratory: No cough, dyspnea Urologic: No hematuria, dysuria Abdominal:   No nausea, vomiting, diarrhea, bright red blood per rectum, melena, or hematemesis Neurologic:  No visual changes, wkns, changes in mental status. All other systems reviewed and are otherwise negative except as noted above.  Physical Exam  Blood pressure 118/72, pulse 95, height 5' 1.5" (1.562 m), weight 152 lb (68.947 kg), SpO2 97 %.  General: Pleasant, NAD Psych: Normal affect. Neuro: Alert and oriented X 3. Moves all extremities spontaneously. HEENT: Normal  Neck: Supple without bruits or JVD. Lungs:  Resp regular and unlabored, CTA. Heart: RRR no s3, s4, or murmurs. Abdomen: Soft,  non-tender, non-distended, BS + x 4.  Extremities: No clubbing, cyanosis or edema. DP/PT very weak, Radials 2+ and equal bilaterally.  Labs:  No results for input(s): CKTOTAL, CKMB, TROPONINI in the last 72 hours. Lab Results  Component Value Date   WBC 12.0* 02/26/2015   HGB 14.4 02/26/2015   HCT 44.4 02/26/2015   MCV 111.7* 02/26/2015   PLT 326 02/26/2015    No results found for: DDIMER Invalid input(s): POCBNP    Component Value Date/Time   NA 138 04/09/2015 1110   NA 144 02/26/2015 1356   K 3.6 04/09/2015 1110   K 3.0* 02/26/2015 1356   CL 101 04/09/2015 1110   CL 104 03/02/2013 1531   CO2 25 04/09/2015 1110   CO2 23 02/26/2015 1356   GLUCOSE 93 04/09/2015 1110   GLUCOSE 166* 02/26/2015 1356   GLUCOSE 132* 03/02/2013 1531   BUN 6 04/09/2015 1110   BUN 6.5* 02/26/2015 1356   CREATININE 0.81 04/09/2015 1110   CREATININE 0.8 02/26/2015 1356   CALCIUM 8.8 04/09/2015 1110   CALCIUM 8.8 02/26/2015 1356   PROT 7.0 02/26/2015  1356   PROT 6.0 11/09/2014 0708   ALBUMIN 3.1* 02/26/2015 1356   ALBUMIN 2.6* 11/09/2014 0708   AST 81* 02/26/2015 1356   AST 53* 11/09/2014 0708   ALT 64* 02/26/2015 1356   ALT 34 11/09/2014 0708   ALKPHOS 100 02/26/2015 1356   ALKPHOS 84 11/09/2014 0708   BILITOT 0.76 02/26/2015 1356   BILITOT 1.6* 11/09/2014 0708   GFRNONAA 83* 11/09/2014 0708   GFRAA >90 11/09/2014 0708   No results found for: CHOL, HDL, LDLCALC, TRIG   TSH 3.3 Free T4 1.32  Accessory Clinical Findings  Echocardiogram - 11/2014 Left ventricle: The cavity size was normal. Systolic function was normal. The estimated ejection fraction was in the range of 60% to 65%. Wall motion was normal; there were no regional wall motion abnormalities. Doppler parameters are consistent with abnormal left ventricular relaxation (grade 1 diastolic dysfunction).  ECG - Sinus tachycardia, rightward axis, otherwise normal  Lexiscan nuclear stress test 12/14/2014 Quantitative Gated Spect Images QGS EDV: 32 ml QGS ESV: 8 ml  Impression Exercise Capacity: Lexiscan with no exercise. BP Response: Normal blood pressure response. Clinical Symptoms: Typical symptoms with Lexiscan ECG Impression: No significant ST segment change suggestive of ischemia. Comparison with Prior Nuclear Study: No images to compare  Overall Impression: Low risk stress nuclear study with no evidence of significant ischemia identified..  LV Ejection Fraction: 75%. LV Wall Motion: NL LV Function; NL Wall Motion  Candee Furbish, MD    Assessment & Plan  69 year old female  1. Palpitations - normal TSH, normal echo, normal catecholamines (no lab in epic),  Holter monitor showed very frequent PVCs 4024 hours and inappropriate sinus tachycardia on 24 hour Holter. Metoprolol make patient more higher and dizzy that resulted in a fall. We discontinued and started Corlanor 5 mg by mouth twice a day with some improvement of symptoms.   It  was approved by her insurance company. Part of her tachycardia might be also deconditioning and dehydration.  She hasn't been exercising since her DVT and PE last summer. She is now stronger and follow with pulmonary rehab. She is advised to increase her intake of free water.  2. Dyspnea on exertion - Lexiscans nuclear stress test was negative for prior scar or ischemia. Primary problem might be deconditioning, she was referred to cardiac rehabilitation we will follow on that.   3.  Claudications - in 01/2015 B/L LE Doppler showed Known chronic bilateral SFA occlusions, and left common iliac artery stenosis. Stable bilateral ABI's in the moderate range. Bilateral great toe pressures are abnormal. We will refer to [facility].   Follow up in 6 months  Dorothy Spark, MD, [facility] 05/01/2015, 3:11 PM

## 2015-05-04 ENCOUNTER — Other Ambulatory Visit: Payer: Self-pay

## 2015-05-04 ENCOUNTER — Encounter: Payer: Self-pay | Admitting: Vascular Surgery

## 2015-05-04 DIAGNOSIS — R140 Abdominal distension (gaseous): Secondary | ICD-10-CM

## 2015-05-04 DIAGNOSIS — R634 Abnormal weight loss: Secondary | ICD-10-CM

## 2015-05-04 DIAGNOSIS — R1011 Right upper quadrant pain: Secondary | ICD-10-CM

## 2015-05-04 DIAGNOSIS — R630 Anorexia: Secondary | ICD-10-CM

## 2015-05-04 DIAGNOSIS — R110 Nausea: Secondary | ICD-10-CM

## 2015-05-09 ENCOUNTER — Ambulatory Visit: Payer: Medicare Other | Admitting: Vascular Surgery

## 2015-05-12 ENCOUNTER — Emergency Department (HOSPITAL_COMMUNITY): Payer: Medicare Other

## 2015-05-12 ENCOUNTER — Encounter (HOSPITAL_COMMUNITY): Payer: Self-pay | Admitting: *Deleted

## 2015-05-12 ENCOUNTER — Emergency Department (HOSPITAL_COMMUNITY)
Admission: EM | Admit: 2015-05-12 | Discharge: 2015-05-13 | Disposition: A | Discharge status: Home / Self Care | Payer: Medicare Other | Attending: Emergency Medicine | Admitting: Emergency Medicine

## 2015-05-12 DIAGNOSIS — Y9289 Other specified places as the place of occurrence of the external cause: Secondary | ICD-10-CM | POA: Insufficient documentation

## 2015-05-12 DIAGNOSIS — Z86718 Personal history of other venous thrombosis and embolism: Secondary | ICD-10-CM | POA: Diagnosis not present

## 2015-05-12 DIAGNOSIS — Z7901 Long term (current) use of anticoagulants: Secondary | ICD-10-CM | POA: Insufficient documentation

## 2015-05-12 DIAGNOSIS — Z8639 Personal history of other endocrine, nutritional and metabolic disease: Secondary | ICD-10-CM | POA: Insufficient documentation

## 2015-05-12 DIAGNOSIS — Y9389 Activity, other specified: Secondary | ICD-10-CM | POA: Insufficient documentation

## 2015-05-12 DIAGNOSIS — F1090 Alcohol use, unspecified, uncomplicated: Secondary | ICD-10-CM

## 2015-05-12 DIAGNOSIS — Z79899 Other long term (current) drug therapy: Secondary | ICD-10-CM | POA: Diagnosis not present

## 2015-05-12 DIAGNOSIS — F419 Anxiety disorder, unspecified: Secondary | ICD-10-CM | POA: Diagnosis not present

## 2015-05-12 DIAGNOSIS — Z8709 Personal history of other diseases of the respiratory system: Secondary | ICD-10-CM | POA: Insufficient documentation

## 2015-05-12 DIAGNOSIS — Y998 Other external cause status: Secondary | ICD-10-CM | POA: Diagnosis not present

## 2015-05-12 DIAGNOSIS — Z7289 Other problems related to lifestyle: Secondary | ICD-10-CM

## 2015-05-12 DIAGNOSIS — S3992XA Unspecified injury of lower back, initial encounter: Secondary | ICD-10-CM | POA: Diagnosis not present

## 2015-05-12 DIAGNOSIS — Z862 Personal history of diseases of the blood and blood-forming organs and certain disorders involving the immune mechanism: Secondary | ICD-10-CM | POA: Insufficient documentation

## 2015-05-12 DIAGNOSIS — Z789 Other specified health status: Secondary | ICD-10-CM

## 2015-05-12 DIAGNOSIS — Z853 Personal history of malignant neoplasm of breast: Secondary | ICD-10-CM | POA: Diagnosis not present

## 2015-05-12 DIAGNOSIS — K219 Gastro-esophageal reflux disease without esophagitis: Secondary | ICD-10-CM | POA: Insufficient documentation

## 2015-05-12 DIAGNOSIS — W1839XA Other fall on same level, initial encounter: Secondary | ICD-10-CM | POA: Insufficient documentation

## 2015-05-12 DIAGNOSIS — F1099 Alcohol use, unspecified with unspecified alcohol-induced disorder: Secondary | ICD-10-CM | POA: Insufficient documentation

## 2015-05-12 DIAGNOSIS — R269 Unspecified abnormalities of gait and mobility: Secondary | ICD-10-CM | POA: Insufficient documentation

## 2015-05-12 DIAGNOSIS — Z9981 Dependence on supplemental oxygen: Secondary | ICD-10-CM | POA: Insufficient documentation

## 2015-05-12 DIAGNOSIS — Z9049 Acquired absence of other specified parts of digestive tract: Secondary | ICD-10-CM | POA: Diagnosis not present

## 2015-05-12 DIAGNOSIS — Z87891 Personal history of nicotine dependence: Secondary | ICD-10-CM | POA: Diagnosis not present

## 2015-05-12 DIAGNOSIS — N289 Disorder of kidney and ureter, unspecified: Secondary | ICD-10-CM | POA: Diagnosis not present

## 2015-05-12 DIAGNOSIS — Z86711 Personal history of pulmonary embolism: Secondary | ICD-10-CM | POA: Diagnosis not present

## 2015-05-12 DIAGNOSIS — W19XXXA Unspecified fall, initial encounter: Secondary | ICD-10-CM

## 2015-05-12 LAB — CBC
HEMATOCRIT: 41 % (ref 36.0–46.0)
Hemoglobin: 14.4 g/dL (ref 12.0–15.0)
MCH: 39.7 pg — ABNORMAL HIGH (ref 26.0–34.0)
MCHC: 35.1 g/dL (ref 30.0–36.0)
MCV: 112.9 fL — ABNORMAL HIGH (ref 78.0–100.0)
Platelets: 258 10*3/uL (ref 150–400)
RBC: 3.63 MIL/uL — AB (ref 3.87–5.11)
RDW: 13.8 % (ref 11.5–15.5)
WBC: 10.9 10*3/uL — ABNORMAL HIGH (ref 4.0–10.5)

## 2015-05-12 LAB — COMPREHENSIVE METABOLIC PANEL
ALT: 57 U/L — ABNORMAL HIGH (ref 14–54)
AST: 99 U/L — ABNORMAL HIGH (ref 15–41)
Albumin: 3.2 g/dL — ABNORMAL LOW (ref 3.5–5.0)
Alkaline Phosphatase: 104 U/L (ref 38–126)
Anion gap: 19 — ABNORMAL HIGH (ref 5–15)
BILIRUBIN TOTAL: 1.5 mg/dL — AB (ref 0.3–1.2)
BUN: 10 mg/dL (ref 6–20)
CO2: 23 mmol/L (ref 22–32)
Calcium: 8.8 mg/dL — ABNORMAL LOW (ref 8.9–10.3)
Chloride: 97 mmol/L — ABNORMAL LOW (ref 101–111)
Creatinine, Ser: 1.56 mg/dL — ABNORMAL HIGH (ref 0.44–1.00)
GFR calc non Af Amer: 33 mL/min — ABNORMAL LOW (ref >60)
GFR, EST AFRICAN AMERICAN: 38 mL/min — AB (ref >60)
Glucose, Bld: 108 mg/dL — ABNORMAL HIGH (ref 65–99)
POTASSIUM: 3.2 mmol/L — AB (ref 3.5–5.1)
Sodium: 139 mmol/L (ref 135–145)
Total Protein: 7.1 g/dL (ref 6.5–8.1)

## 2015-05-12 LAB — LIPASE, BLOOD: Lipase: 42 U/L (ref 22–51)

## 2015-05-12 LAB — I-STAT TROPONIN, ED: TROPONIN I, POC: 0.01 ng/mL (ref 0.00–0.08)

## 2015-05-12 MED ORDER — LORAZEPAM 2 MG/ML IJ SOLN
1.0000 mg | Freq: Once | INTRAMUSCULAR | Status: AC
  Administered 2015-05-12: 1 mg via INTRAVENOUS
  Filled 2015-05-12: qty 1

## 2015-05-12 MED ORDER — SODIUM CHLORIDE 0.9 % IV BOLUS (SEPSIS)
1000.0000 mL | Freq: Once | INTRAVENOUS | Status: AC
  Administered 2015-05-12: 1000 mL via INTRAVENOUS

## 2015-05-12 NOTE — ED Notes (Signed)
Per pt, having dizziness and frequent falls recently. Reports chronic diarrhea. HR 130 at triage. Reports most recent fall was last night and having upper abd pain since then. Per family, heavy etoh use.

## 2015-05-12 NOTE — ED Notes (Signed)
Patient transported to CT 

## 2015-05-12 NOTE — ED Provider Notes (Signed)
CSN: [id number]     Arrival date & time 05/12/15  1625 History   First MD Initiated Contact with Patient 05/12/15 1814     Chief Complaint  Patient presents with  . Fall     (Consider location/radiation/quality/duration/timing/severity/associated sxs/prior Treatment) Patient is a 69 y.o. female presenting with fall.  Fall  69 y.o. female presents after fall while intoxicated.  Pt is a chronic alcoholic, drinks 1/2 gallon of tequilla every few days.  She has chronic gait instability from weakness after recent dx of PE/DVT.  Fall mechanical, occurred yesterday.  Pain in abd and L hip primarily, constant, worsened by movement, alleviated by rest.  Nonradiating pain.  Pain sharp.  Past Medical History  Diagnosis Date  . Esophageal stricture   . HLD (hyperlipidemia)   . Dysthymic disorder   . Other and unspecified coagulation defects   . Diaphragmatic hernia without mention of obstruction or gangrene   . PAD (peripheral artery disease)   . Pulmonary embolism 06/2014    "both lungs"  . DVT (deep venous thrombosis) 06/2014    RLE  . GERD (gastroesophageal reflux disease)   . History of hiatal hernia   . Anxiety   . Malignant neoplasm of breast (female), unspecified site 2002    "right", NO BLOOD PRESSURES OR STICKS IN RIGHT ARM  . SVT (supraventricular tachycardia) 11/07/2014  . On home oxygen therapy     "2L at night and prn" (11/08/2014)  . Atherosclerosis   . Alcohol abuse   . Chronic respiratory failure   . Colitis    Past Surgical History  Procedure Laterality Date  . Appendectomy  12/1977  . Cholecystectomy  12/1977  . Breast lumpectomy Right 01/2001  . Tubal ligation  1980's  . Cardiac catheterization  1990's  . Esophagogastroduodenoscopy (egd) with esophageal dilation  X 7   Family History  Problem Relation Age of Onset  . Heart disease Father   . Heart attack Father   . Colon cancer Neg Hx   . Lung cancer Maternal Grandfather   . Deep vein thrombosis Daughter     History  Substance Use Topics  . Smoking status: Former Smoker -- 1.50 packs/day for 38 years    Types: Cigarettes    Quit date: 12/08/1998  . Smokeless tobacco: Never Used  . Alcohol Use: 27.6 oz/week    32 Shots of liquor, 14 Standard drinks or equivalent per week     Comment: 11/08/2014 "2-3, 1 1/2shot drinks/night"   OB History    No data available     Review of Systems Negative with exception of history as above  Allergies  Ciprofloxacin  Home Medications   Prior to Admission medications   Medication Sig Start Date End Date Taking? Authorizing Provider  ALPRAZolam (XANAX) 0.25 MG tablet Take 0.25 mg by mouth daily.    Yes Historical Provider, MD  clobetasol ointment (TEMOVATE) 0.05 % Apply 0.17 application topically as needed. itching 10/09/14  Yes Historical Provider, MD  escitalopram (LEXAPRO) 10 MG tablet Take 10 mg by mouth daily. 11/24/14  Yes Historical Provider, MD  ivabradine (CORLANOR) 5 MG TABS tablet Take 1 tablet (5 mg total) by mouth 2 (two) times daily with a meal. 03/21/15  Yes Dorothy Spark, MD  loperamide (IMODIUM) 2 MG capsule Take 2 mg by mouth daily as needed for diarrhea or loose stools.   Yes Historical Provider, MD  omeprazole (PRILOSEC) 20 MG capsule Take 1 capsule (20 mg total) by mouth daily. 04/09/15  Yes Amy S Esterwood, PA-C  ondansetron (ZOFRAN-ODT) 4 MG disintegrating tablet Take 1 tablet (4 mg total) by mouth 2 (two) times daily. vomiting Patient taking differently: Take 4 mg by mouth 2 (two) times daily. Take two tablets daily prn for vomiting 04/09/15  Yes Amy S Esterwood, PA-C  pantoprazole (PROTONIX) 40 MG tablet Take 40 mg by mouth 2 (two) times daily.   Yes Historical Provider, MD  promethazine (PHENERGAN) 25 MG tablet Take 12.5 mg by mouth every 6 (six) hours as needed for nausea or vomiting.   Yes Historical Provider, MD  rivaroxaban (XARELTO) 20 MG TABS tablet Take 20 mg by mouth daily with supper.   Yes Historical Provider, MD  OXYGEN  Inhale 2 L/min into the lungs at bedtime.    Historical Provider, MD   BP 118/78 mmHg  Pulse 91  Temp(Src) 98.1 F (36.7 C) (Oral)  Resp 18  Ht 5\' 1"  (1.549 m)  Wt 152 lb (68.947 kg)  BMI 28.74 kg/m2  SpO2 100% Physical Exam Gen: well appearing, well developed Head: atraumatic Neck: FROM Chest: L chest wall tenderness, no bruising Card: RRR, no MRG Pulm: Lungs CTAB Abd: LUQ, LLQ tenderness, L flank pain MSK: Bruising over L hip, FROM, pulses 2+ Neuro: oriented, no focal deficits Skin: bruising over L hip ED Course  Procedures (including critical care time) Labs Review Labs Reviewed  CBC - Abnormal; Notable for the following:    WBC 10.9 (*)    RBC 3.63 (*)    MCV 112.9 (*)    MCH 39.7 (*)    All other components within normal limits  COMPREHENSIVE METABOLIC PANEL - Abnormal; Notable for the following:    Potassium 3.2 (*)    Chloride 97 (*)    Glucose, Bld 108 (*)    Creatinine, Ser 1.56 (*)    Calcium 8.8 (*)    Albumin 3.2 (*)    AST 99 (*)    ALT 57 (*)    Total Bilirubin 1.5 (*)    GFR calc non Af Amer 33 (*)    GFR calc Af Amer 38 (*)    Anion gap 19 (*)    All other components within normal limits  LIPASE, BLOOD  URINALYSIS, ROUTINE W REFLEX MICROSCOPIC (NOT AT [facility])  I-STAT TROPOININ, ED    Imaging Review Ct Abdomen Pelvis Wo Contrast  05/12/2015   CLINICAL DATA:  Initial evaluation for acute trauma, fall.  EXAM: CT ABDOMEN AND PELVIS WITHOUT CONTRAST  TECHNIQUE: Multidetector CT imaging of the abdomen and pelvis was performed following the standard protocol without IV contrast.  COMPARISON:  Prior study from 04/30/2015  FINDINGS: Mild subpleural fibrotic changes noted within the partially visualized lung bases. Visualized lung bases are otherwise clear. Coronary artery calcifications partially visualized. No pleural or pericardial effusion.  Severe hypoattenuation of the liver is compatible with steatosis. Gallbladder is absent. No biliary dilatation.  Spleen, adrenal glands, and pancreas demonstrate a normal unenhanced appearance.  Scattered calcific densities within the bilateral renal hila favored to be vascular in nature. No definite nephrolithiasis. No hydronephrosis. No focal renal lesions identified on this noncontrast examination.  Moderate hiatal hernia present. Stomach within normal limits. Small bowel of normal caliber and appearance without associated inflammatory changes. There is circumferential wall thickening about the cecum and ascending colon without associated inflammatory stranding. Colon otherwise within normal limits.  Bladder largely decompressed but grossly normal. Uterus and ovaries within normal limits for patient age.  The no free air or fluid.  No pathologically enlarged intra-abdominal or pelvic lymph nodes identified. Advanced atheromatous plaque present within the intra-abdominal aorta and its branch vessels. No aneurysm.  There is scattered inflammatory fat stranding within the left inguinal region extending anteriorly towards the upper left thigh (series 2, image 87). Finding may reflect mild contusion related to recent fall.  No acute fracture or other osseous abnormality. No worrisome lytic or blastic osseous lesions. Scoliosis with multilevel degenerative disc disease and facet arthropathy noted within the visualized spine.  IMPRESSION: 1. Mild soft tissue stranding within the subcutaneous fat of the left inguinal region extending laterally towards the anterior aspect of the upper left thigh. Finding may reflect mild contusion given history of fall. Correlation with physical exam recommended. 2. No other acute traumatic injury within the abdomen and pelvis. 3. Mild circumferential wall thickening about the cecum and ascending colon. Correlation for possible infectious or inflammatory colitis recommended. Additionally, correlation with colonoscopy as clinically warranted. 4. Hepatic steatosis. 5. Advanced atheromatous plaque  throughout the intra-abdominal aorta and its branch vessels with diffuse 3 vessel coronary artery calcifications. No aneurysm. 6. Moderate hiatal hernia. 7. Scoliosis with advanced multilevel degenerative disc disease and facet arthropathy.   Electronically Signed   By: Jeannine Boga M.D.   On: 05/12/2015 22:57   Dg Chest 2 View  05/12/2015   CLINICAL DATA:  [place] today.  EXAM: CHEST  2 VIEW  COMPARISON:  01/04/2015  FINDINGS: The heart size and mediastinal contours are within normal limits. Both lungs are clear. The visualized skeletal structures are unremarkable.  IMPRESSION: Normal chest x-ray.   Electronically Signed   By: Marijo Sanes M.D.   On: 05/12/2015 19:03   Ct Head Wo Contrast  05/12/2015   CLINICAL DATA:  Initial evaluation for acute trauma, fall.  EXAM: CT HEAD WITHOUT CONTRAST  TECHNIQUE: Contiguous axial images were obtained from the base of the skull through the vertex without intravenous contrast.  COMPARISON:  Prior CT from 05/02/2013  FINDINGS: Diffuse prominence of the CSF containing spaces is compatible with generalized age-related cerebral atrophy. Patchy hypodensity within the periventricular and deep white matter both cerebral hemispheres most consistent with chronic small vessel ischemic changes. Prominent vascular calcifications present within the carotid siphons.  No acute large vessel territory infarct. No intracranial hemorrhage. No mass lesion, mass effect, or midline shift. No hydrocephalus. No extra-axial fluid collection.  Scalp soft tissues within normal limits. No acute abnormality about the orbits.  Minimal mucosal thickening noted within the right maxillary sinus. Paranasal sinuses are otherwise clear. No mastoid effusion.  Calvarium intact.  IMPRESSION: 1. No acute intracranial process. 2. Generalized cerebral atrophy with moderate chronic small vessel ischemic disease.   Electronically Signed   By: Jeannine Boga M.D.   On: 05/12/2015 23:05     EKG  Interpretation   Date/Time:  Saturday May 12 2015 16:40:39 EDT Ventricular Rate:  123 PR Interval:  118 QRS Duration: 74 QT Interval:  354 QTC Calculation: 506 R Axis:   89 Text Interpretation:  Sinus tachycardia Nonspecific ST abnormality  Abnormal ECG No significant change since last tracing Confirmed by Debby Freiberg [phone]) on 05/12/2015 8:13:27 PM      MDM   Final diagnoses:  Renal insufficiency  Fall, initial encounter  Alcohol use    69 year old female with a history of malignant breast cancer anxiety, depression, DVTs and PEs currently on Xarelto, and alcohol abuse who presents today with left-sided chest and left upper quadrant pain after a fall from standing yesterday.  History and exam as above.  Given the patient's recurrent falls and history of Xarelto to use, we'll obtain imaging to assess for any possible intracranial bleed, intrathoracic or intra-abdominal bleeding.  CT head with no acute injury. CT abdomen with no evidence of any intra-abdominal bleed, chest x-ray with no evidence of hemothorax. Labs did reveal evidence of renal insufficiency. Next  Emergency for discharge and given strict return precautions. Patient instructed to follow up with her PCP for repeat renal function study. The patient was provided with alcohol rehabilitation information.  Patient seen in conjunction with Dr. Colin Rhein.  Norville Haggard, MD 05/13/15 [phone]  I saw and evaluated the patient, reviewed the resident's note and I agree with the findings and plan.   EKG Interpretation   Date/Time:  Saturday May 12 2015 16:40:39 EDT Ventricular Rate:  123 PR Interval:  118 QRS Duration: 74 QT Interval:  354 QTC Calculation: 506 R Axis:   89 Text Interpretation:  Sinus tachycardia Nonspecific ST abnormality  Abnormal ECG No significant change since last tracing Confirmed by Debby Freiberg [phone]) on 05/12/2015 8:13:27 PM       Briefly, pt is a 69 y.o.  female presenting with abd pain after fall.  I performed an examination on the patient including cardiac, pulmonary, and gi systems which were concurrent with resident's note.  Wu as above unremarkable.  Discussed importance of ceasing ETOH abuse in setting of weakness, however feel she is stable to dc home at this time.  DC home in stable condition.     Debby Freiberg, MD 05/13/15 [phone]

## 2015-05-12 NOTE — ED Notes (Signed)
Pt son at bedside, states that pt has been brought in to try and get help with alcohol abuse and also possible placement for SNF. Pt has undocumented hx of uncontrolled anxiety, depression, and alcohol abuse. Pt family states pt is at harm for herself due to drinking alcohol and also due to pt allegedly "falling on purpose." Pt family looking for possible psych eval and [facility] placement or SNF due to pt husband not being able to take care of self.

## 2015-05-12 NOTE — ED Notes (Signed)
Be[facility]son [phone]

## 2015-05-12 NOTE — ED Notes (Addendum)
Patient would like something to eat and drink.

## 2015-05-12 NOTE — Discharge Instructions (Signed)
Alcohol and Nutrition °Nutrition serves two purposes. It provides energy. It also maintains body structure and function. Food supplies energy. It also provides the building blocks needed to replace worn or damaged cells. Alcoholics often eat poorly. This limits their supply of essential nutrients. This affects energy supply and structure maintenance. Alcohol also affects the body's nutrients in: °· Digestion. °· Storage. °· Using and getting rid of waste products. °IMPAIRMENT OF NUTRIENT DIGESTION AND UTILIZATION  °· Once ingested, food must be broken down into small components (digested). Then it is available for energy. It helps maintain body structure and function. Digestion begins in the mouth. It continues in the stomach and intestines, with help from the pancreas. The nutrients from digested food are absorbed from the intestines into the blood. Then they are carried to the liver. The liver prepares nutrients for: °· Immediate use. °· Storage and future use. °· Alcohol inhibits the breakdown of nutrients into usable molecules. °· It decreases secretion of digestive enzymes from the pancreas. °· Alcohol impairs nutrient absorption by damaging the cells lining the stomach and intestines. °· It also interferes with moving some nutrients into the blood. °· In addition, nutritional deficiencies themselves may lead to further absorption problems. °· For example, folate deficiency changes the cells that line the small intestine. This impairs how water is absorbed. It also affects absorbed nutrients. These include glucose, sodium, and additional folate. °· Even if nutrients are digested and absorbed, alcohol can prevent them from being fully used. It changes their transport, storage, and excretion. Impaired utilization of nutrients by alcoholics is indicated by: °· Decreased liver stores of vitamins, such as vitamin A. °· Increased excretion of nutrients such as fat. °ALCOHOL AND ENERGY SUPPLY  °· Three basic  nutritional components found in food are: °· Carbohydrates. °· Proteins. °· Fats. °· These are used as energy. Some alcoholics take in as much as 50% of their total daily calories from alcohol. They often neglect important foods. °· Even when enough food is eaten, alcohol can impair the ways the body controls blood sugar (glucose) levels. It may either increase or decrease blood sugar. °· In non-diabetic alcoholics, increased blood sugar (hyperglycemia) is caused by poor insulin secretion. It is usually temporary. °· Decreased blood sugar (hypoglycemia) can cause serious injury even if this condition is short-lived. Low blood sugar can happen when a fasting or malnourished person drinks alcohol. When there is no food to supply energy, stored sugar is used up. The products of alcohol inhibit forming glucose from other compounds such as amino acids. As a result, alcohol causes the brain and other body tissue to lack glucose. It is needed for energy and function. °· Alcohol is an energy source. But how the body processes and uses the energy from alcohol is complex. Also, when alcohol is substituted for carbohydrates, subjects tend to lose weight. This indicates that they get less energy from alcohol than from food. °ALCOHOL - MAINTAINING CELL STRUCTURE AND FUNCTION  °Structure °Cells are made mostly of protein. So an adequate protein diet is important for maintaining cell structure. This is especially true if cells are being damaged. Research indicates that alcohol affects protein nutrition by causing impaired: °· Digestion of proteins to amino acids. °· Processing of amino acids by the small intestine and liver. °· Synthesis of proteins from amino acids. °· Protein secretion by the liver. °Function °Nutrients are essential for the body to function well. They provide the tools that the body needs to work well:  °·   Proteins.  Vitamins.  Minerals. Alcohol can disrupt body function. It may cause nutrient  deficiencies. And it may interfere with the way nutrients are processed. Vitamins  Vitamins are essential to maintain growth and normal metabolism. They regulate many of the body`s processes. Chronic heavy drinking causes deficiencies in many vitamins. This is caused by eating less. And, in some cases, vitamins may be poorly absorbed. For example, alcohol inhibits fat absorption. It impairs how the vitamins A, E, and D are normally absorbed along with dietary fats. Not enough vitamin A may cause night blindness. Not enough vitamin D may cause softening of the bones.  Some alcoholics lack vitamins A, C, D, E, K, and the B vitamins. These are all involved in wound healing and cell maintenance. In particular, because vitamin K is necessary for blood clotting, lacking that vitamin can cause delayed clotting. The result is excess bleeding. Lacking other vitamins involved in brain function may cause severe neurological damage. Minerals Deficiencies of minerals such as calcium, magnesium, iron, and zinc are common in alcoholics. The alcohol itself does not seem to affect how these minerals are absorbed. Rather, they seem to occur secondary to other alcohol-related problems, such as:  Less calcium absorbed.  Not enough magnesium.  More urinary excretion.  Vomiting.  Diarrhea.  Not enough iron due to gastrointestinal bleeding.  Not enough zinc or losses related to other nutrient deficiencies.  Mineral deficiencies can cause a variety of medical consequences. These range from calcium-related bone disease to zinc-related night blindness and skin lesions. ALCOHOL, MALNUTRITION, AND MEDICAL COMPLICATIONS  Liver Disease   Alcoholic liver damage is caused primarily by alcohol itself. But poor nutrition may increase the risk of alcohol-related liver damage. For example, nutrients normally found in the liver are known to be affected by drinking alcohol. These include carotenoids, which are the major  sources of vitamin A, and vitamin E compounds. Decreases in such nutrients may play some role in alcohol-related liver damage. Pancreatitis  Research suggests that malnutrition may increase the risk of developing alcoholic pancreatitis. Research suggests that a diet lacking in protein may increase alcohol's damaging effect on the pancreas. Brain  Nutritional deficiencies may have severe effects on brain function. These may be permanent. Specifically, thiamine deficiencies are often seen in alcoholics. They can cause severe neurological problems. These include:  Impaired movement.  Memory loss seen in Wernicke-Korsakoff syndrome. Pregnancy  Alcohol has toxic effects on fetal development. It causes alcohol-related birth defects. They include fetal alcohol syndrome. Alcohol itself is toxic to the fetus. Also, the nutritional deficiency can affect how the fetus develops. That may compound the risk of developmental damage.  Nutritional needs during pregnancy are 10% to 30% greater than normal. Food intake can increase by as much as 140% to cover the needs of both mother and fetus. An alcoholic mother`s nutritional problems may adversely affect the nutrition of the fetus. And alcohol itself can also restrict nutrition flow to the fetus. NUTRITIONAL STATUS OF ALCOHOLICS  Techniques for assessing nutritional status include:  Taking body measurements to estimate fat reserves. They include:  Weight.  Height.  Mass.  Skin fold thickness.  Performing blood analysis to provide measurements of circulating:  Proteins.  Vitamins.  Minerals.  These techniques tend to be imprecise. For many nutrients, there is no clear "cut-off" point that would allow an accurate definition of deficiency. So assessing the nutritional status of alcoholics is limited by these techniques. Dietary status may provide information about the risk of developing nutritional problems.  Dietary status is assessed by:  Taking  patients' dietary histories.  Evaluating the amount and types of food they are eating.  It is difficult to determine what exact amount of alcohol begins to have damaging effects on nutrition. In general, moderate drinkers have 2 drinks or less per day. They seem to be at little risk for nutritional problems. Various medical disorders begin to appear at greater levels.  Research indicates that the majority of even the heaviest drinkers have few obvious nutritional deficiencies. Many alcoholics who are hospitalized for medical complications of their disease do have severe malnutrition. Alcoholics tend to eat poorly. Often they eat less than the amounts of food necessary to provide enough:  Carbohydrates.  Protein.  Fat.  Vitamins A and C.  B vitamins.  Minerals like calcium and iron. Of major concern is alcohol's effect on digesting food and use of nutrients. It may shift a mildly malnourished person toward severe malnutrition. Document Released: 09/18/2005 Document Revised: 02/16/2012 Document Reviewed: 03/04/2006 [facility] Patient Information 2015 [place], [place]. This information is not intended to replace advice given to you by your health care provider. Make sure you discuss any questions you have with your health care provider.  Fall Prevention and Home Safety Falls cause injuries and can affect all age groups. It is possible to use preventive measures to significantly decrease the likelihood of falls. There are many simple measures which can make your home safer and prevent falls. OUTDOORS  Repair cracks and edges of walkways and driveways.  Remove high doorway thresholds.  Trim shrubbery on the main path into your home.  Have good outside lighting.  Clear walkways of tools, rocks, debris, and clutter.  Check that handrails are not broken and are securely fastened. Both sides of steps should have handrails.  Have leaves, snow, and ice cleared regularly.  Use sand or salt  on walkways during winter months.  In the garage, clean up grease or oil spills. BATHROOM  Install night lights.  Install grab bars by the toilet and in the tub and shower.  Use non-skid mats or decals in the tub or shower.  Place a plastic non-slip stool in the shower to sit on, if needed.  Keep floors dry and clean up all water on the floor immediately.  Remove soap buildup in the tub or shower on a regular basis.  Secure bath mats with non-slip, double-sided rug tape.  Remove throw rugs and tripping hazards from the floors. BEDROOMS  Install night lights.  Make sure a bedside light is easy to reach.  Do not use oversized bedding.  Keep a telephone by your bedside.  Have a firm chair with side arms to use for getting dressed.  Remove throw rugs and tripping hazards from the floor. KITCHEN  Keep handles on pots and pans turned toward the center of the stove. Use back burners when possible.  Clean up spills quickly and allow time for drying.  Avoid walking on wet floors.  Avoid hot utensils and knives.  Position shelves so they are not too high or low.  Place commonly used objects within easy reach.  If necessary, use a sturdy step stool with a grab bar when reaching.  Keep electrical cables out of the way.  Do not use floor polish or wax that makes floors slippery. If you must use wax, use non-skid floor wax.  Remove throw rugs and tripping hazards from the floor. STAIRWAYS  Never leave objects on stairs.  Place handrails on both  sides of stairways and use them. Fix any loose handrails. Make sure handrails on both sides of the stairways are as long as the stairs.  Check carpeting to make sure it is firmly attached along stairs. Make repairs to worn or loose carpet promptly.  Avoid placing throw rugs at the top or bottom of stairways, or properly secure the rug with carpet tape to prevent slippage. Get rid of throw rugs, if possible.  Have an  electrician put in a light switch at the top and bottom of the stairs. OTHER FALL PREVENTION TIPS  Wear low-heel or rubber-soled shoes that are supportive and fit well. Wear closed toe shoes.  When using a stepladder, make sure it is fully opened and both spreaders are firmly locked. Do not climb a closed stepladder.  Add color or contrast paint or tape to grab bars and handrails in your home. Place contrasting color strips on first and last steps.  Learn and use mobility aids as needed. Install an electrical emergency response system.  Turn on lights to avoid dark areas. Replace light bulbs that burn out immediately. Get light switches that glow.  Arrange furniture to create clear pathways. Keep furniture in the same place.  Firmly attach carpet with non-skid or double-sided tape.  Eliminate uneven floor surfaces.  Select a carpet pattern that does not visually hide the edge of steps.  Be aware of all pets. OTHER HOME SAFETY TIPS  Set the water temperature for 120 F (48.8 C).  Keep emergency numbers on or near the telephone.  Keep smoke detectors on every level of the home and near sleeping areas. Document Released: 11/14/2002 Document Revised: 05/25/2012 Document Reviewed: 02/13/2012 [facility] Patient Information 2015 [place], [place]. This information is not intended to replace advice given to you by your health care provider. Make sure you discuss any questions you have with your health care provider.   Emergency Department Resource Guide 1) Find a Doctor and Pay Out of Pocket Although you won't have to find out who is covered by your insurance plan, it is a good idea to ask around and get recommendations. You will then need to call the office and see if the doctor you have chosen will accept you as a new patient and what types of options they offer for patients who are self-pay. Some doctors offer discounts or will set up payment plans for their patients who do not have  insurance, but you will need to ask so you aren't surprised when you get to your appointment.  2) Contact Your Local Health Department Not all health departments have doctors that can see patients for sick visits, but many do, so it is worth a call to see if yours does. If you don't know where your local health department is, you can check in your phone book. The CDC also has a tool to help you locate your state's health department, and many state websites also have listings of all of their local health departments.  3) Find a [facility] If your illness is not likely to be very severe or complicated, you may want to try a walk in clinic. These are popping up all over the country in pharmacies, drugstores, and shopping centers. They're usually staffed by nurse practitioners or physician assistants that have been trained to treat common illnesses and complaints. They're usually fairly quick and inexpensive. However, if you have serious medical issues or chronic medical problems, these are probably not your best option.  No Primary Care  Doctor: - Call Health Connect at  [phone] - they can help you locate a primary care doctor that  accepts your insurance, provides certain services, etc. - Physician Referral Service- [phone]  Chronic Pain Problems: Organization         Address  Phone   Notes  [facility]  [phone] Patients need to be referred by their primary care doctor.   Medication Assistance: Organization         Address  Phone   Notes  [facility] Medication [facility] [address] [place], [place] [address] [phone] --Must be a resident of [facility] -- Must have NO insurance coverage whatsoever (no Medicaid/ Medicare, etc.) -- The pt. MUST have a primary care doctor that directs their care regularly and follows them in the community   [facility]ist  [phone]   [employer]  [phone]    Agencies that provide  inexpensive medical care: Organization         Address  Phone   Notes  [place]  [phone]   [facility]    [phone]   [facility] [place], [place] [address] [phone]   [place] [address] [place] [phone]   Planned Parenthood    [phone]   [facility]    [phone]   [place] and [place] [address], [place] Phone:  [phone], Fax:  [phone] Hours of Operation:  9 am - 6 pm, M-F.  Also accepts Medicaid/Medicare and self-pay.  [facility] [place] [address], [address], [place] Phone: [phone], Fax: [phone]. Hours of Operation:  8:30 am - 5:30 pm, M-F.  Also accepts Medicaid and self-pay.  [facility] [place] [address], [place] Phone: [phone]   [place], [place], [place] [phone], Ext. [phone] Mondays & Thursdays: 7-9 AM.  First 15 patients are seen on a first come, first serve basis.    [place] Providers:  Organization         Address  Phone   Notes  [facility] [address], [place] [phone] Also accepts self-pay patients.  [facility] [address], [place]  [phone]   [place], [address], [place] [phone]   [facility] [address], [place] [phone]   Lucianne Lei [address], [place]   [phone] Only accepts [place] Access [place] patients after they have their name applied to their card.   Self-Pay (no insurance) in [facility]:  Organization         Address  Phone   Notes  Sickle Cell Patients, [facility] [place] [phone]   [facility] Urgent  Care [place] [phone]   [facility] [place]  [place], [place], [place] [phone]   [facility]/Dr. Osei-Bonsu  [address] [place] or [address], [place] [phone] Phone number for both [place] and [place] locations is the same.  Urgent Medical and [facility] [address] [place] [phone]   [place]  [facility] [place] or [address] [phone]   [facility] [place] [phone], phone; [phone], fax Sees patients 1st and 3rd Saturday of every month.  Must not qualify for public or private insurance (i.e. Medicaid, Medicare, [place] Health Choice, Veterans' Benefits)  Household income should be no more than 200% of the poverty level The clinic cannot treat you if you are pregnant or think you are pregnant  Sexually transmitted diseases are not treated at the clinic.    Dental Care: Organization         Address  Phone  Notes  [facility] Department of [facility] [place] [phone] Accepts children up to age 51 who are enrolled in [place] or [place]; pregnant women with a Medicaid card; and children who have applied for Medicaid or [place] Health Choice, but were declined, whose parents can pay a reduced fee at time of service.  [facility] Department of [facility]  [address], [place] [phone] Accepts children up to age 38 who are enrolled in [place] or [place]; pregnant women with a Medicaid card; and children who have applied for Medicaid or [place] Health Choice, but were declined, whose parents can pay a reduced fee at time of service.  [place] Adult [facility] Access PROGRAM  [place] [phone] Patients are seen by appointment only. Walk-ins are  not accepted. [place] will see patients 63 years of age and older. Monday - Tuesday (8am-5pm) Most Wednesdays (8:30-5pm) $30 per visit, cash only  [facility] Adult Dental Access PROGRAM  [address], [facility] [phone] Patients are seen by appointment only. Walk-ins are not accepted. [place] will see patients 77 years of age and older. One Wednesday Evening (Monthly: Volunteer Based).  $30 per visit, cash only  [place]  [phone] for adults; Children under age 57, call Graduate Pediatric Dentistry at [phone]. Children aged 27-14, please call [phone] to request a pediatric application.  Dental services are provided in all areas of dental care including fillings, crowns and bridges, complete and partial dentures, implants, gum treatment, root canals, and extractions. Preventive care is also provided. Treatment is provided to both adults and children. Patients are selected via a lottery and there is often a waiting list.   [facility] [address], [place]  [phone] [email or web address]   [facility] [address] [place], [place] [phone], Ext. [phone] Second and Fourth Thursday of each month, opens at 6:30 AM; Clinic ends at 9 AM.  Patients are seen on a first-come first-served basis, and a limited number are seen during each clinic.   [facility]  [address] [place]ard Danker [place], [place] [phone]   Eligibility Requirements You must have lived in [place], [place], or [place] counties for at least the last three months.   You cannot be eligible for state or federal sponsored [employer], including [employer], [place], or [employer].   You generally cannot be eligible for healthcare insurance through your employer.    How to apply: Eligibility screenings are held every Tuesday and Wednesday afternoon from 1:00 pm until 4:00 pm. You do not need an appointment for  the interview!  [facility] [address], [place], [place]   [place]  [place] Department  [place]  [phone]    Behavioral Health Resources in the Community: Intensive Outpatient Programs Organization         Address  Phone  Notes  [place]. [address], [place], [place] [phone]   [facility] Outpatient [address] [place], [place]   ADS: Alcohol & Drug Svcs [address], [place], [place]   [place] [address]  [place], [place] or [phone]   Substance Abuse Resources Organization         Address  Phone  Notes  Alcohol and Drug Services  [phone]   [place]  [phone]   [facility]   Chinita Pester  [phone]   Residential & Outpatient Substance Abuse Program  [phone]   Psychological Services Organization         Address  Phone  Notes  [facility] [place]  [place]  [phone]   [place] [address], [place] or [phone]    Mobile Crisis Teams Organization         Address  Phone  Notes  [facility], Mobile Crisis Care Unit  [phone]   [facility]  [address] [place], [place]   [place] [address], [place] [phone]    Self-Help/Support Groups Organization         Address  Phone             Notes  [place] of [place] - variety of support groups  [place] Call for more information  Narcotics Anonymous (NA), [facility] [address], [employer] [place]  2 meetings at this location   Special educational needs teacher         Address  Phone  Notes  ASAP Residential Treatment  [place],    [place]  [phone]   [facility]  [address] [place] [address], [place], [place]   [place], [place] [phone] Admissions: 8am-3pm M-F  Incentives Substance [place] [address],    [place], [place] [phone]   [facility] [address] [place], [place], [place]   [facility] [address],  [place] Lopez de [place], [place]   [facility] - Intensive Outpatient [address] [place]een [address], [place], [place]   [facility].,  [place], [place] [phone] or [phone]   [facility] Services ([facility]S) [address] [place], [place] Accepts Medicaid  [facility] [place] [address]  [place] [phone] Substance Abuse/Addiction Treatment   [facility] Organization         Address  Phone  Notes  [facility]  [phone]   Domenic Schwab, PhD [address] [place]lis [place], [place]   [phone] or [phone]   [place], [place] [phone]   [place] [address] [place], [place] [phone] Insurance/Medicaid/sponsorship through [facility] [address]  [place], [place] [phone] [place], [place] [phone]    Dr. Adele Schilder  [phone]   [facility] Dept. 1) [address] [place] 2) [place] 3)  [address], [place] [phone]  [phone]   [place] [phone] or [phone] (After Hours)

## 2015-05-15 ENCOUNTER — Ambulatory Visit: Payer: Medicare Other | Admitting: Physical Therapy

## 2015-05-16 ENCOUNTER — Ambulatory Visit: Payer: Medicare Other

## 2015-05-21 ENCOUNTER — Encounter: Payer: Self-pay | Admitting: Vascular Surgery

## 2015-05-24 ENCOUNTER — Ambulatory Visit (INDEPENDENT_AMBULATORY_CARE_PROVIDER_SITE_OTHER): Payer: Medicare Other | Admitting: Vascular Surgery

## 2015-05-24 ENCOUNTER — Encounter: Payer: Self-pay | Admitting: Vascular Surgery

## 2015-05-24 VITALS — BP 100/79 | HR 90 | Ht 61.0 in | Wt 151.0 lb

## 2015-05-24 DIAGNOSIS — I739 Peripheral vascular disease, unspecified: Secondary | ICD-10-CM | POA: Diagnosis not present

## 2015-05-24 DIAGNOSIS — I70219 Atherosclerosis of native arteries of extremities with intermittent claudication, unspecified extremity: Secondary | ICD-10-CM

## 2015-05-24 NOTE — Progress Notes (Signed)
Established Intermittent Claudication  History of Present Illness  Theresa [name] is a 69 y.o. (02-19-1946) female who presents with chief complaint: claudication symptoms in bilateral LE.  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   She has had angiograms in the distant past performed by Dr. Gwenlyn Found  The patient's symptoms have occurred after walking 50 yard her posterior thigh and calf hurt.  If she stops walking the pain goes away and she can walk more.  She denise rest pain and has never had any wounds or non healing ulcers on her feet or legs. In her more recent history July of 2015 she had bilateral PEs and right LE DVT.  She has been deconditioned since there and states the rate limiting step to walking has become shortness of breath.  She denise DM, hypercholesterolemia or hypertension.  She currently takes Xarelto daily since her PEs and DVT.     Current Outpatient Prescriptions on File Prior to Visit  Medication Sig Dispense Refill  . ALPRAZolam (XANAX) 0.25 MG tablet Take 0.25 mg by mouth daily.     . clobetasol ointment (TEMOVATE) 0.05 % Apply 9.51 application topically as needed. itching  1  . escitalopram (LEXAPRO) 10 MG tablet Take 10 mg by mouth daily.  0  . ivabradine (CORLANOR) 5 MG TABS tablet Take 1 tablet (5 mg total) by mouth 2 (two) times daily with a meal. 180 tablet 1  . loperamide (IMODIUM) 2 MG capsule Take 2 mg by mouth daily as needed for diarrhea or loose stools.    . ondansetron (ZOFRAN-ODT) 4 MG disintegrating tablet Take 1 tablet (4 mg total) by mouth 2 (two) times daily. vomiting (Patient taking differently: Take 4 mg by mouth 2 (two) times daily. Take two tablets daily prn for vomiting) 60 tablet 6  . pantoprazole (PROTONIX) 40 MG tablet Take 40 mg by mouth 2 (two) times daily.    . promethazine (PHENERGAN) 25 MG tablet Take 12.5 mg by mouth every 6 (six) hours as needed for nausea or  vomiting.    . rivaroxaban (XARELTO) 20 MG TABS tablet Take 20 mg by mouth daily with supper.    [name] omeprazole (PRILOSEC) 20 MG capsule Take 1 capsule (20 mg total) by mouth daily. (Patient not taking: Reported on 05/24/2015) 30 capsule 6  . OXYGEN Inhale 2 L/min into the lungs at bedtime.    . [DISCONTINUED] lansoprazole (PREVACID) 30 MG capsule Take 30 mg by mouth daily.       No current facility-administered medications on file prior to visit.    Review of Systems  Constitutional: Negative for fever and chills.  HENT: Negative for hearing loss.   Eyes: Negative for blurred vision and double vision.  Respiratory: Positive for shortness of breath.   Cardiovascular: Positive for palpitations and claudication.  Gastrointestinal: Negative for nausea, vomiting and abdominal pain.  Musculoskeletal: Positive for falls.  Skin: Negative for itching and rash.  Neurological: Positive for dizziness, tingling, weakness and headaches. Negative for sensory change, speech change and focal weakness.    Physical Examination  Filed Vitals:   05/24/15 1534  BP: 100/79  Pulse: 90  Height: 5\' 1"  (1.549 m)  Weight: 151 lb (68.493 kg)  SpO2: 97%   Body mass index is 28.55 kg/(m^2).  General: A&O x 3, WD becomes SOB with on going conversation Pulmonary: Sym exp, good air movt, CTAB, no rales, rhonchi, & wheezing  Cardiac: RRR, Nl  S1, S2, no Murmurs, rubs or gallops Vascular: Vessel Right Left  Radial Palpable Palpable  Brachial Palpable Palpable  Carotid Palpable, without bruit Palpable, without bruit  Aorta Not palpable N/A  Femoral Palpable Palpable  Popliteal Not palpable Not palpable  PT Not Palpable Palpable  DP Palpable notPalpable   Gastrointestinal: soft, NTND, -G/R, - HSM, - masses, - CVAT B Musculoskeletal: M/S 4+/5 throughout, Extremities without ischemic changes   Neurologic: Pain and light touch intact in extremities, Motor exam as listed above  Non-Invasive Vascular  Imaging ABI (Date: 05/02/2015)  R: PT 0, DP 0.74 TBI0.77   L: PT0.78, DP0.0, TBI 0.56   Medical Decision Making  Theresa [name] is a 69 y.o. female who presents with:  bilateral leg  claudication without evidence of critical limb ischemia or rest pain..  She has not had much change in her ABI results compared to previous studies.  Her biggest change has come about due to deconditioning post PE and DVT one year ago last July.  She wants to get back into pulmonary rehab as well as water activities in the future.  We would recommend this as well as a walking program for her arterial flow.  She is in stable condition with her symptoms of claudication.  We will see her back in 3 months with repeat ABI's.   Theda Sers EMMA [facility] PA-C [facility] Office: [phone]  She was seen today in conjunction with Dr. Scot Dock.  05/24/2015, 4:29 PM

## 2015-05-25 NOTE — Addendum Note (Signed)
Addended by: Dorthula Rue L on: 05/25/2015 11:02 AM   Modules accepted: Orders

## 2015-05-31 ENCOUNTER — Encounter: Payer: Medicare Other | Admitting: Gastroenterology

## 2015-06-04 ENCOUNTER — Other Ambulatory Visit: Payer: Self-pay

## 2015-06-05 ENCOUNTER — Telehealth: Payer: Self-pay | Admitting: Gastroenterology

## 2015-06-05 NOTE — Telephone Encounter (Signed)
The patient has been on Xanax for a long time and she takes it daily. Spoke with Linna Hoff at [facility]. The patient is okay to take it before her procedure.

## 2015-06-06 ENCOUNTER — Encounter (HOSPITAL_COMMUNITY): Payer: Self-pay | Admitting: Gastroenterology

## 2015-06-06 ENCOUNTER — Encounter (HOSPITAL_COMMUNITY)
Admission: RE | Disposition: A | Discharge status: Home / Self Care | Payer: Self-pay | Source: Ambulatory Visit | Attending: Gastroenterology

## 2015-06-06 ENCOUNTER — Ambulatory Visit (HOSPITAL_COMMUNITY)
Admission: RE | Admit: 2015-06-06 | Discharge: 2015-06-06 | Disposition: A | Discharge status: Home / Self Care | Payer: Medicare Other | Source: Ambulatory Visit | Attending: Gastroenterology | Admitting: Gastroenterology

## 2015-06-06 DIAGNOSIS — K219 Gastro-esophageal reflux disease without esophagitis: Secondary | ICD-10-CM | POA: Diagnosis not present

## 2015-06-06 DIAGNOSIS — R140 Abdominal distension (gaseous): Secondary | ICD-10-CM

## 2015-06-06 DIAGNOSIS — Z853 Personal history of malignant neoplasm of breast: Secondary | ICD-10-CM | POA: Diagnosis not present

## 2015-06-06 DIAGNOSIS — K449 Diaphragmatic hernia without obstruction or gangrene: Secondary | ICD-10-CM | POA: Insufficient documentation

## 2015-06-06 DIAGNOSIS — Z7901 Long term (current) use of anticoagulants: Secondary | ICD-10-CM | POA: Diagnosis not present

## 2015-06-06 DIAGNOSIS — Z86711 Personal history of pulmonary embolism: Secondary | ICD-10-CM | POA: Diagnosis not present

## 2015-06-06 DIAGNOSIS — Z87891 Personal history of nicotine dependence: Secondary | ICD-10-CM | POA: Insufficient documentation

## 2015-06-06 DIAGNOSIS — Z86718 Personal history of other venous thrombosis and embolism: Secondary | ICD-10-CM | POA: Diagnosis not present

## 2015-06-06 DIAGNOSIS — Z9981 Dependence on supplemental oxygen: Secondary | ICD-10-CM | POA: Diagnosis not present

## 2015-06-06 DIAGNOSIS — I872 Venous insufficiency (chronic) (peripheral): Secondary | ICD-10-CM | POA: Insufficient documentation

## 2015-06-06 DIAGNOSIS — R634 Abnormal weight loss: Secondary | ICD-10-CM

## 2015-06-06 DIAGNOSIS — J9621 Acute and chronic respiratory failure with hypoxia: Secondary | ICD-10-CM | POA: Diagnosis not present

## 2015-06-06 DIAGNOSIS — D72829 Elevated white blood cell count, unspecified: Secondary | ICD-10-CM | POA: Insufficient documentation

## 2015-06-06 DIAGNOSIS — K222 Esophageal obstruction: Secondary | ICD-10-CM | POA: Insufficient documentation

## 2015-06-06 DIAGNOSIS — R1011 Right upper quadrant pain: Secondary | ICD-10-CM

## 2015-06-06 DIAGNOSIS — R630 Anorexia: Secondary | ICD-10-CM

## 2015-06-06 DIAGNOSIS — R110 Nausea: Secondary | ICD-10-CM

## 2015-06-06 DIAGNOSIS — Z9049 Acquired absence of other specified parts of digestive tract: Secondary | ICD-10-CM | POA: Insufficient documentation

## 2015-06-06 HISTORY — PX: ESOPHAGOGASTRODUODENOSCOPY: SHX5428

## 2015-06-06 SURGERY — EGD (ESOPHAGOGASTRODUODENOSCOPY)
Anesthesia: Moderate Sedation

## 2015-06-06 MED ORDER — FENTANYL CITRATE (PF) 100 MCG/2ML IJ SOLN
INTRAMUSCULAR | Status: AC
  Filled 2015-06-06: qty 2

## 2015-06-06 MED ORDER — MIDAZOLAM HCL 10 MG/2ML IJ SOLN
INTRAMUSCULAR | Status: DC | PRN
  Administered 2015-06-06 (×3): 2 mg via INTRAVENOUS

## 2015-06-06 MED ORDER — MIDAZOLAM HCL 5 MG/ML IJ SOLN
INTRAMUSCULAR | Status: AC
  Filled 2015-06-06: qty 2

## 2015-06-06 MED ORDER — METOCLOPRAMIDE HCL 10 MG PO TABS: 10.0000 mg | ORAL_TABLET | Freq: Three times a day (TID) | ORAL | Status: DC

## 2015-06-06 MED ORDER — FENTANYL CITRATE (PF) 100 MCG/2ML IJ SOLN
INTRAMUSCULAR | Status: DC | PRN
  Administered 2015-06-06 (×2): 25 ug via INTRAVENOUS

## 2015-06-06 MED ORDER — SODIUM CHLORIDE 0.9 % IV SOLN
INTRAVENOUS | Status: DC
  Administered 2015-06-06: 500 mL via INTRAVENOUS

## 2015-06-06 NOTE — Discharge Instructions (Signed)
Esophagogastroduodenoscopy °Care After °Refer to this sheet in the next few weeks. These instructions provide you with information on caring for yourself after your procedure. Your caregiver may also give you more specific instructions. Your treatment has been planned according to current medical practices, but problems sometimes occur. Call your caregiver if you have any problems or questions after your procedure.  °HOME CARE INSTRUCTIONS °· Do not eat or drink anything until the numbing medicine (local anesthetic) has worn off and your gag reflex has returned. You will know that the local anesthetic has worn off when you can swallow comfortably. °· Do not drive for 12 hours after the procedure or as directed by your caregiver. °· Only take medicines as directed by your caregiver. °SEEK MEDICAL CARE IF:  °· You cannot stop coughing. °· You are not urinating at all or less than usual. °SEEK IMMEDIATE MEDICAL CARE IF: °· You have difficulty swallowing. °· You cannot eat or drink. °· You have worsening throat or chest pain. °· You have dizziness, lightheadedness, or you faint. °· You have nausea or vomiting. °· You have chills. °· You have a fever. °· You have severe abdominal pain. °· You have black, tarry, or bloody stools. °Document Released: 11/10/2012 Document Reviewed: 11/10/2012 °ExitCare® Patient Information ©2015 [employer]. This information is not intended to replace advice given to you by your health care provider. Make sure you discuss any questions you have with your health care provider. ° °

## 2015-06-06 NOTE — Op Note (Signed)
[facility] [place], [address]   ENDOSCOPY PROCEDURE REPORT  PATIENT: Theresa [name], Theresa [name]  MR#: [id number] BIRTHDATE: 05/30/1946 , 53  yrs. old GENDER: female ENDOSCOPIST: Inda Castle, MD REFERRED BY: PROCEDURE DATE:  06/06/2015 PROCEDURE:  EGD, diagnostic ASA CLASS:     Class II INDICATIONS:  nausea. MEDICATIONS: Versed 4 mg IV and Fentanyl 50 mcg IV TOPICAL ANESTHETIC: Cetacaine Spray  DESCRIPTION OF PROCEDURE: After the risks benefits and alternatives of the procedure were thoroughly explained, informed consent was obtained.  The Pentax Gastroscope [id number] endoscope was introduced through the mouth and advanced to the second portion of the duodenum , Without limitations.  The instrument was slowly withdrawn as the mucosa was fully examined.    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  The stricture was traversable with resistance.   Except for the findings listed, the EGD was otherwise normal.  Retroflexed views revealed no abnormalities.     The scope was then withdrawn from the patient and the procedure completed.  COMPLICATIONS: There were no immediate complications.  ENDOSCOPIC IMPRESSION: 1.   There was a stricture at the gastroesophageal junction 2.  small hiatal hernia 2.   EGD was otherwise normal  RECOMMENDATIONS: 1.  empiric trial of Reglan 10 mg one half-hour before meals and at bedtime 2.  Schedule EGD with balloon dilation of distal esophageal stricture (Xarelto will have to be held if approved by cardiology)  REPEAT EXAM:  eSigned:  Inda Castle, MD 06/06/2015 9:31 AM    CC:

## 2015-06-06 NOTE — H&P (Signed)
Subjective:    Patient ID: Theresa [name], female DOB: [birth year], 69 y.o. MRN: [id number]  HPI Theresa [name] is a pleasant 69 year old white female known to Dr. Deatra Ina. She was last seen about 6 weeks ago with complaints of abdominal discomfort and ongoing nausea. She has been having her current symptoms for about 5 months. Dr. Deatra Ina felt her symptoms may be secondary to polypharmacy. She had Theresa [name] last undergone an EGD in 2012 with dilation of esophageal stricture and also had colonoscopy in 2012 which was normal. Is status post cholecystectomy, has history of breast cancer, history of DVT and PE in July 2015 and is on [name]. Also with history of EtOH use/chronically alt all somatic complaints and deconditioning with chronic respiratory failure. She is on oxygen at night. She had undergone CT of the abdomen and pelvis in December 2015 for same complaints and this showed diffuse hepatic steatosis and severe degenerative changes in her lumbar spine moderate hiatal hernia. She is unable to do a gastric emptying scan because she attempted this one several years ago and gagged on the egg and cannot even think about swallowing and 8 at this point. She also feels that she can't do a barium study because she can't keep down the barium. She has claustrophobia and cannot go on a closed MRI. She does have a prescription for Zofran which she does find somewhat helpful. She says her nausea is always worse first thing in the morning and early in the day and not as bad later in the day. She doesn't give any definite symptoms of early satiety just says she has no appetite. She feels nauseated but does not vomit has no current complaints of dysphagia. She has lost some weight but is uncertain of the amount of Weight is actually stable by our scales over the past 6 weeks. She also complains of constipation alternating with diarrhea.  Review of Systems Pertinent positive and negative review of systems  were noted in the above HPI section. All other review of systems was otherwise negative.  Outpatient Encounter Prescriptions as of 04/09/2015  . Order #: [id number] Class: Historical Med  . Order #: [id number] Class: Historical Med  . Order #: [id number] Class: Historical Med  . Order #: [id number] Class: Historical Med  . Order #: [id number] Class: Normal  . Order #: [id number] Class: Normal  . Order #: [id number] Class: Historical Med  . Order #: [id number] Class: Historical Med  . Order #: [id number] Class: Historical Med  . [DISCONTINUED] Order #: [id number] Class: Normal  . Order #: [id number] Class: Normal  . [DISCONTINUED] Order #: [id number] Class: Normal   Allergies  Allergen Reactions  . Ciprofloxacin Hives   Patient Active Problem List   Diagnosis Date Noted  . Dyspnea and respiratory abnormality 01/04/2015  . Chronic nausea 12/27/2014  . Cough 12/27/2014  . Hypoxia 11/08/2014  . Dyspnea 11/08/2014  . Hematemesis 11/08/2014  . History of pulmonary embolism 11/08/2014  . Alcohol abuse 11/08/2014  . SVT (supraventricular tachycardia) 11/08/2014  . Acute pulmonary embolus 10/25/2014  . Nausea with vomiting 09/12/2014  . Chronic respiratory failure 06/17/2014  . Elevated TSH 06/17/2014  . Hypokalemia 06/17/2014  . Pulmonary embolus 06/15/2014  . Acute respiratory failure with hypoxia 06/15/2014  . Chronic venous insufficiency 10/26/2013  . Closed fracture of 5th metacarpal 05/02/2013  . Fall 05/02/2013  . 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 Sepsis secondary to pyelonphritis / gram negative rod (E.  Coli) bacteremia 06/23/2012  . Leukocytosis 06/22/2012  . COLITIS 06/20/2010  . Malignant neoplasm of female breast 02/05/2009  . HYPERLIPIDEMIA 02/05/2009  . ANXIETY DEPRESSION 02/05/2009  . ESOPHAGEAL  STRICTURE 02/05/2009  . GERD 02/05/2009   History   Social History  . Marital Status: Married    Spouse Name: N/A  . Number of Children: 2  . Years of Education: N/A   Occupational History  . Unemployed    Social History Main Topics  . Smoking status: Former Smoker -- 1.50 packs/day for 38 years    Types: Cigarettes    Quit date: 12/08/1998  . Smokeless tobacco: Never Used  . Alcohol Use: 27.6 oz/week    32 Shots of liquor, 14 Standard drinks or equivalent per week     Comment: 11/08/2014 "2-3, 1 1/2shot drinks/night"  . Drug Use: No  . Sexual Activity: Yes    Birth Control/ Protection: None   Other Topics Concern  . Not on file   Social History Narrative    Theresa [name]'s family history includes Deep vein thrombosis in her daughter; Heart attack in her father; Heart disease in her father; Lung cancer in her maternal grandfather. There is no history of Colon cancer.      Objective:    Filed Vitals:   04/09/15 0958  BP: 102/70  Pulse: 100    Physical Exam well-developed older white female in no acute distress accompanied by her husband blood pressure 102/70 pulse 100 height 5 foot 1 weight 155. HEENT ;nontraumatic normocephalic EOMI PERRLA sclera anicteric Neck; supple no JVD, Cardiovascular; regular rate and rhythm with S1-S2 no murmur rub or gallop, Pulmonary ;clear bilaterally, Abdomen ;soft she has mild rather generalized tenderness more prominent across the upper abdomen no guarding or rebound no palpable mass or hepatosplenomegaly bowel sounds are present cholecystectomy scar,, Rectal; exam not done, ext; no clubbing cyanosis or edema skin warm and dry she ambulates with difficulty, Neuropsych; mood and affect appropriate though she looks to her husband frequently for help with answers       Assessment & Plan:   #1 69 yo female with persistent nausea, lack of appetite-etiology  not clear, associated with upper abdominal discomfort. Negative Ct 5 months ago, higher risk for sedation with O2 use and currently anticoagulated Unable to tolerate barium, or egg for GE scan #2 Hx PE and DVT 06/2014 #3 chronic anticoagulation- pn Xarelto #4 ETOH  #5hx breast Ca #5 chronic respiratory failure/nocturnal O2  #6 s/p GB #7 GERD  Plan; Continue Zofran 4 mg ODT -take early am and second dose mid afternoon daily Add prilosec 20 m daily EGD

## 2015-06-07 ENCOUNTER — Encounter (HOSPITAL_COMMUNITY): Payer: Self-pay | Admitting: Gastroenterology

## 2015-06-20 ENCOUNTER — Telehealth: Payer: Self-pay | Admitting: *Deleted

## 2015-06-20 NOTE — Telephone Encounter (Signed)
TC from patient requesting last several CMET panels be fax'd to her pcp, Dr. Harrington Challenger.   Done.

## 2015-06-21 ENCOUNTER — Encounter: Payer: Self-pay | Admitting: Gastroenterology

## 2015-06-26 ENCOUNTER — Telehealth: Payer: Self-pay

## 2015-06-26 NOTE — Telephone Encounter (Signed)
Per procedure note the patient needs a second EGD scheduled with ballon dilation. She is on Xarelto. Would need this held if approved by cardiology. Patient declines to schedule the EGD at this time. She has an appointment for anemia and elevated bili on 07/06/15.

## 2015-06-26 NOTE — Telephone Encounter (Signed)
Yes, would need to hold anricoagulants

## 2015-07-03 ENCOUNTER — Inpatient Hospital Stay (HOSPITAL_COMMUNITY)
Admission: EM | Admit: 2015-07-03 | Discharge: 2015-07-19 | DRG: 432 | Disposition: A | Discharge status: Skilled Nursing Facility | Payer: Medicare Other | Attending: Internal Medicine | Admitting: Internal Medicine

## 2015-07-03 ENCOUNTER — Encounter (HOSPITAL_COMMUNITY): Payer: Self-pay | Admitting: Emergency Medicine

## 2015-07-03 ENCOUNTER — Emergency Department (HOSPITAL_COMMUNITY): Payer: Medicare Other

## 2015-07-03 DIAGNOSIS — E43 Unspecified severe protein-calorie malnutrition: Secondary | ICD-10-CM | POA: Diagnosis present

## 2015-07-03 DIAGNOSIS — F1010 Alcohol abuse, uncomplicated: Secondary | ICD-10-CM | POA: Diagnosis present

## 2015-07-03 DIAGNOSIS — Z79899 Other long term (current) drug therapy: Secondary | ICD-10-CM | POA: Diagnosis not present

## 2015-07-03 DIAGNOSIS — J690 Pneumonitis due to inhalation of food and vomit: Secondary | ICD-10-CM | POA: Diagnosis not present

## 2015-07-03 DIAGNOSIS — E878 Other disorders of electrolyte and fluid balance, not elsewhere classified: Secondary | ICD-10-CM | POA: Diagnosis present

## 2015-07-03 DIAGNOSIS — L8990 Pressure ulcer of unspecified site, unspecified stage: Secondary | ICD-10-CM | POA: Diagnosis present

## 2015-07-03 DIAGNOSIS — E876 Hypokalemia: Secondary | ICD-10-CM | POA: Diagnosis present

## 2015-07-03 DIAGNOSIS — F10231 Alcohol dependence with withdrawal delirium: Secondary | ICD-10-CM | POA: Diagnosis not present

## 2015-07-03 DIAGNOSIS — E7220 Disorder of urea cycle metabolism, unspecified: Secondary | ICD-10-CM

## 2015-07-03 DIAGNOSIS — G9340 Encephalopathy, unspecified: Secondary | ICD-10-CM

## 2015-07-03 DIAGNOSIS — G8250 Quadriplegia, unspecified: Secondary | ICD-10-CM | POA: Diagnosis not present

## 2015-07-03 DIAGNOSIS — J988 Other specified respiratory disorders: Secondary | ICD-10-CM | POA: Diagnosis not present

## 2015-07-03 DIAGNOSIS — R1310 Dysphagia, unspecified: Secondary | ICD-10-CM | POA: Diagnosis present

## 2015-07-03 DIAGNOSIS — K7041 Alcoholic hepatic failure with coma: Secondary | ICD-10-CM | POA: Diagnosis present

## 2015-07-03 DIAGNOSIS — Z7901 Long term (current) use of anticoagulants: Secondary | ICD-10-CM

## 2015-07-03 DIAGNOSIS — E870 Hyperosmolality and hypernatremia: Secondary | ICD-10-CM | POA: Diagnosis not present

## 2015-07-03 DIAGNOSIS — K219 Gastro-esophageal reflux disease without esophagitis: Secondary | ICD-10-CM | POA: Diagnosis present

## 2015-07-03 DIAGNOSIS — I471 Supraventricular tachycardia: Secondary | ICD-10-CM | POA: Diagnosis present

## 2015-07-03 DIAGNOSIS — R000 Tachycardia, unspecified: Secondary | ICD-10-CM | POA: Diagnosis present

## 2015-07-03 DIAGNOSIS — J9600 Acute respiratory failure, unspecified whether with hypoxia or hypercapnia: Secondary | ICD-10-CM

## 2015-07-03 DIAGNOSIS — J9602 Acute respiratory failure with hypercapnia: Secondary | ICD-10-CM | POA: Diagnosis not present

## 2015-07-03 DIAGNOSIS — E162 Hypoglycemia, unspecified: Secondary | ICD-10-CM | POA: Diagnosis present

## 2015-07-03 DIAGNOSIS — N390 Urinary tract infection, site not specified: Secondary | ICD-10-CM | POA: Diagnosis present

## 2015-07-03 DIAGNOSIS — F10239 Alcohol dependence with withdrawal, unspecified: Secondary | ICD-10-CM | POA: Diagnosis present

## 2015-07-03 DIAGNOSIS — Z881 Allergy status to other antibiotic agents status: Secondary | ICD-10-CM

## 2015-07-03 DIAGNOSIS — G312 Degeneration of nervous system due to alcohol: Secondary | ICD-10-CM | POA: Diagnosis present

## 2015-07-03 DIAGNOSIS — I10 Essential (primary) hypertension: Secondary | ICD-10-CM | POA: Diagnosis present

## 2015-07-03 DIAGNOSIS — Z853 Personal history of malignant neoplasm of breast: Secondary | ICD-10-CM | POA: Diagnosis not present

## 2015-07-03 DIAGNOSIS — D689 Coagulation defect, unspecified: Secondary | ICD-10-CM | POA: Diagnosis present

## 2015-07-03 DIAGNOSIS — K7010 Alcoholic hepatitis without ascites: Secondary | ICD-10-CM | POA: Diagnosis present

## 2015-07-03 DIAGNOSIS — Z86711 Personal history of pulmonary embolism: Secondary | ICD-10-CM

## 2015-07-03 DIAGNOSIS — K7682 Hepatic encephalopathy: Secondary | ICD-10-CM

## 2015-07-03 DIAGNOSIS — J9690 Respiratory failure, unspecified, unspecified whether with hypoxia or hypercapnia: Secondary | ICD-10-CM

## 2015-07-03 DIAGNOSIS — J9622 Acute and chronic respiratory failure with hypercapnia: Secondary | ICD-10-CM | POA: Diagnosis present

## 2015-07-03 DIAGNOSIS — D638 Anemia in other chronic diseases classified elsewhere: Secondary | ICD-10-CM | POA: Diagnosis present

## 2015-07-03 DIAGNOSIS — R740 Nonspecific elevation of levels of transaminase and lactic acid dehydrogenase [LDH]: Secondary | ICD-10-CM

## 2015-07-03 DIAGNOSIS — Z882 Allergy status to sulfonamides status: Secondary | ICD-10-CM

## 2015-07-03 DIAGNOSIS — K7291 Hepatic failure, unspecified with coma: Secondary | ICD-10-CM

## 2015-07-03 DIAGNOSIS — Z86718 Personal history of other venous thrombosis and embolism: Secondary | ICD-10-CM

## 2015-07-03 DIAGNOSIS — F10939 Alcohol use, unspecified with withdrawal, unspecified: Secondary | ICD-10-CM

## 2015-07-03 DIAGNOSIS — J9601 Acute respiratory failure with hypoxia: Secondary | ICD-10-CM | POA: Diagnosis present

## 2015-07-03 DIAGNOSIS — R609 Edema, unspecified: Secondary | ICD-10-CM | POA: Diagnosis not present

## 2015-07-03 DIAGNOSIS — J9621 Acute and chronic respiratory failure with hypoxia: Secondary | ICD-10-CM | POA: Diagnosis present

## 2015-07-03 DIAGNOSIS — Z8249 Family history of ischemic heart disease and other diseases of the circulatory system: Secondary | ICD-10-CM

## 2015-07-03 DIAGNOSIS — K760 Fatty (change of) liver, not elsewhere classified: Secondary | ICD-10-CM | POA: Diagnosis present

## 2015-07-03 DIAGNOSIS — Z801 Family history of malignant neoplasm of trachea, bronchus and lung: Secondary | ICD-10-CM | POA: Diagnosis not present

## 2015-07-03 DIAGNOSIS — F419 Anxiety disorder, unspecified: Secondary | ICD-10-CM | POA: Diagnosis present

## 2015-07-03 DIAGNOSIS — R410 Disorientation, unspecified: Secondary | ICD-10-CM | POA: Diagnosis present

## 2015-07-03 DIAGNOSIS — E785 Hyperlipidemia, unspecified: Secondary | ICD-10-CM | POA: Diagnosis present

## 2015-07-03 DIAGNOSIS — Z87891 Personal history of nicotine dependence: Secondary | ICD-10-CM | POA: Diagnosis not present

## 2015-07-03 DIAGNOSIS — F341 Dysthymic disorder: Secondary | ICD-10-CM | POA: Diagnosis present

## 2015-07-03 DIAGNOSIS — Z9049 Acquired absence of other specified parts of digestive tract: Secondary | ICD-10-CM | POA: Diagnosis present

## 2015-07-03 DIAGNOSIS — Z9981 Dependence on supplemental oxygen: Secondary | ICD-10-CM

## 2015-07-03 DIAGNOSIS — R4182 Altered mental status, unspecified: Secondary | ICD-10-CM | POA: Diagnosis not present

## 2015-07-03 DIAGNOSIS — R0902 Hypoxemia: Secondary | ICD-10-CM

## 2015-07-03 DIAGNOSIS — K7290 Hepatic failure, unspecified without coma: Secondary | ICD-10-CM

## 2015-07-03 DIAGNOSIS — F10230 Alcohol dependence with withdrawal, uncomplicated: Secondary | ICD-10-CM

## 2015-07-03 DIAGNOSIS — K7030 Alcoholic cirrhosis of liver without ascites: Secondary | ICD-10-CM | POA: Diagnosis present

## 2015-07-03 DIAGNOSIS — E440 Moderate protein-calorie malnutrition: Secondary | ICD-10-CM | POA: Insufficient documentation

## 2015-07-03 DIAGNOSIS — Z87440 Personal history of urinary (tract) infections: Secondary | ICD-10-CM

## 2015-07-03 DIAGNOSIS — R7401 Elevation of levels of liver transaminase levels: Secondary | ICD-10-CM

## 2015-07-03 LAB — URINALYSIS, ROUTINE W REFLEX MICROSCOPIC
GLUCOSE, UA: NEGATIVE mg/dL
HGB URINE DIPSTICK: NEGATIVE
Ketones, ur: 15 mg/dL — AB
Nitrite: POSITIVE — AB
Protein, ur: NEGATIVE mg/dL
Specific Gravity, Urine: 1.025 (ref 1.005–1.030)
Urobilinogen, UA: 4 mg/dL — ABNORMAL HIGH (ref 0.0–1.0)
pH: 6 (ref 5.0–8.0)

## 2015-07-03 LAB — PROTIME-INR
INR: 3.36 — AB (ref 0.00–1.49)
Prothrombin Time: 33.3 seconds — ABNORMAL HIGH (ref 11.6–15.2)

## 2015-07-03 LAB — CBC
HEMATOCRIT: 32.3 % — AB (ref 36.0–46.0)
Hemoglobin: 10.6 g/dL — ABNORMAL LOW (ref 12.0–15.0)
MCH: 42.2 pg — AB (ref 26.0–34.0)
MCHC: 32.8 g/dL (ref 30.0–36.0)
MCV: 128.7 fL — AB (ref 78.0–100.0)
Platelets: 231 10*3/uL (ref 150–400)
RBC: 2.51 MIL/uL — AB (ref 3.87–5.11)
RDW: 21.2 % — AB (ref 11.5–15.5)
WBC: 15.2 10*3/uL — ABNORMAL HIGH (ref 4.0–10.5)

## 2015-07-03 LAB — COMPREHENSIVE METABOLIC PANEL
ALBUMIN: 2.2 g/dL — AB (ref 3.5–5.0)
ALT: 98 U/L — AB (ref 14–54)
AST: 287 U/L — ABNORMAL HIGH (ref 15–41)
Alkaline Phosphatase: 147 U/L — ABNORMAL HIGH (ref 38–126)
Anion gap: 21 — ABNORMAL HIGH (ref 5–15)
BUN: 9 mg/dL (ref 6–20)
CALCIUM: 8.9 mg/dL (ref 8.9–10.3)
CHLORIDE: 99 mmol/L — AB (ref 101–111)
CO2: 21 mmol/L — ABNORMAL LOW (ref 22–32)
Creatinine, Ser: 1.05 mg/dL — ABNORMAL HIGH (ref 0.44–1.00)
GFR calc Af Amer: >60 mL/min (ref >60)
GFR calc non Af Amer: 53 mL/min — ABNORMAL LOW (ref >60)
GLUCOSE: 89 mg/dL (ref 65–99)
POTASSIUM: 2.9 mmol/L — AB (ref 3.5–5.1)
Sodium: 141 mmol/L (ref 135–145)
Total Bilirubin: 18.6 mg/dL (ref 0.3–1.2)
Total Protein: 6.7 g/dL (ref 6.5–8.1)

## 2015-07-03 LAB — BLOOD GAS, VENOUS
ACID-BASE DEFICIT: 0.6 mmol/L (ref 0.0–2.0)
Bicarbonate: 21.6 mEq/L (ref 20.0–24.0)
O2 Saturation: 43.3 %
PATIENT TEMPERATURE: 98.6
PH VEN: 7.498 — AB (ref 7.250–7.300)
TCO2: 20 mmol/L (ref 0–100)
pCO2, Ven: 28.1 mmHg — ABNORMAL LOW (ref 45.0–50.0)

## 2015-07-03 LAB — AMMONIA: Ammonia: 47 umol/L — ABNORMAL HIGH (ref 9–35)

## 2015-07-03 LAB — URINE MICROSCOPIC-ADD ON

## 2015-07-03 LAB — ACETAMINOPHEN LEVEL: Acetaminophen (Tylenol), Serum: <10 ug/mL — ABNORMAL LOW (ref 10–30)

## 2015-07-03 LAB — APTT: aPTT: 46 seconds — ABNORMAL HIGH (ref 24–37)

## 2015-07-03 LAB — MRSA PCR SCREENING: MRSA by PCR: NEGATIVE

## 2015-07-03 LAB — HEPARIN LEVEL (UNFRACTIONATED): Heparin Unfractionated: 1.32 IU/mL — ABNORMAL HIGH (ref 0.30–0.70)

## 2015-07-03 LAB — MAGNESIUM: MAGNESIUM: 1.5 mg/dL — AB (ref 1.7–2.4)

## 2015-07-03 MED ORDER — SODIUM CHLORIDE 0.9 % IV SOLN
INTRAVENOUS | Status: AC
  Administered 2015-07-03: 17:00:00 via INTRAVENOUS

## 2015-07-03 MED ORDER — SODIUM CHLORIDE 0.9 % IV BOLUS (SEPSIS)
2000.0000 mL | Freq: Once | INTRAVENOUS | Status: AC
  Administered 2015-07-03: 2000 mL via INTRAVENOUS

## 2015-07-03 MED ORDER — LORAZEPAM 2 MG/ML IJ SOLN
0.0000 mg | Freq: Four times a day (QID) | INTRAMUSCULAR | Status: DC
Start: 2015-07-03 — End: 2015-07-04
  Administered 2015-07-03 – 2015-07-04 (×4): 2 mg via INTRAVENOUS
  Filled 2015-07-03 (×4): qty 1

## 2015-07-03 MED ORDER — THIAMINE HCL 100 MG/ML IJ SOLN
200.0000 mg | Freq: Once | INTRAMUSCULAR | Status: AC
  Administered 2015-07-03: 200 mg via INTRAVENOUS
  Filled 2015-07-03: qty 2

## 2015-07-03 MED ORDER — THIAMINE HCL 100 MG/ML IJ SOLN
100.0000 mg | Freq: Every day | INTRAMUSCULAR | Status: DC
  Administered 2015-07-04 – 2015-07-10 (×7): 100 mg via INTRAVENOUS
  Filled 2015-07-03 (×7): qty 2

## 2015-07-03 MED ORDER — SODIUM CHLORIDE 0.9 % IV SOLN: INTRAVENOUS | Status: DC

## 2015-07-03 MED ORDER — FOLIC ACID 1 MG PO TABS
1.0000 mg | ORAL_TABLET | Freq: Every day | ORAL | Status: DC
  Administered 2015-07-03: 1 mg via ORAL
  Filled 2015-07-03: qty 1

## 2015-07-03 MED ORDER — ONDANSETRON HCL 4 MG PO TABS: 4.0000 mg | ORAL_TABLET | Freq: Four times a day (QID) | ORAL | Status: DC | PRN

## 2015-07-03 MED ORDER — CETYLPYRIDINIUM CHLORIDE 0.05 % MT LIQD
7.0000 mL | Freq: Two times a day (BID) | OROMUCOSAL | Status: DC
  Administered 2015-07-04 – 2015-07-08 (×9): 7 mL via OROMUCOSAL

## 2015-07-03 MED ORDER — ADULT MULTIVITAMIN W/MINERALS CH
1.0000 | ORAL_TABLET | Freq: Every day | ORAL | Status: DC
  Administered 2015-07-05: 1 via ORAL
  Filled 2015-07-03 (×2): qty 1

## 2015-07-03 MED ORDER — LORAZEPAM 2 MG/ML IJ SOLN: 2.0000 mg | INTRAMUSCULAR | Status: DC | PRN

## 2015-07-03 MED ORDER — POTASSIUM CHLORIDE 10 MEQ/100ML IV SOLN
10.0000 meq | Freq: Once | INTRAVENOUS | Status: AC
  Administered 2015-07-03: 10 meq via INTRAVENOUS
  Filled 2015-07-03: qty 100

## 2015-07-03 MED ORDER — HEPARIN (PORCINE) IN NACL 100-0.45 UNIT/ML-% IJ SOLN
950.0000 [IU]/h | INTRAMUSCULAR | Status: DC
  Administered 2015-07-03: 950 [IU]/h via INTRAVENOUS
  Filled 2015-07-03 (×2): qty 250

## 2015-07-03 MED ORDER — POTASSIUM CHLORIDE 10 MEQ/100ML IV SOLN: 10.0000 meq | INTRAVENOUS | Status: DC

## 2015-07-03 MED ORDER — LORAZEPAM 2 MG/ML IJ SOLN: 0.0000 mg | Freq: Two times a day (BID) | INTRAMUSCULAR | Status: DC

## 2015-07-03 MED ORDER — LORAZEPAM 1 MG PO TABS: 1.0000 mg | ORAL_TABLET | Freq: Four times a day (QID) | ORAL | Status: DC | PRN

## 2015-07-03 MED ORDER — LORAZEPAM 2 MG/ML IJ SOLN
1.0000 mg | Freq: Four times a day (QID) | INTRAMUSCULAR | Status: DC | PRN
  Administered 2015-07-05 (×2): 1 mg via INTRAVENOUS
  Filled 2015-07-03 (×2): qty 1

## 2015-07-03 MED ORDER — POTASSIUM CHLORIDE 10 MEQ/100ML IV SOLN
10.0000 meq | INTRAVENOUS | Status: AC
  Administered 2015-07-03 (×3): 10 meq via INTRAVENOUS
  Filled 2015-07-03 (×3): qty 100

## 2015-07-03 MED ORDER — LACTULOSE ENEMA
300.0000 mL | Freq: Every day | ORAL | Status: DC
  Administered 2015-07-03 – 2015-07-04 (×2): 300 mL via RECTAL
  Filled 2015-07-03 (×2): qty 300

## 2015-07-03 MED ORDER — SODIUM CHLORIDE 0.9 % IJ SOLN
3.0000 mL | Freq: Two times a day (BID) | INTRAMUSCULAR | Status: DC
  Administered 2015-07-03 – 2015-07-14 (×17): 3 mL via INTRAVENOUS

## 2015-07-03 MED ORDER — VITAMIN B-1 100 MG PO TABS: 100.0000 mg | ORAL_TABLET | Freq: Every day | ORAL | Status: DC

## 2015-07-03 MED ORDER — CHLORHEXIDINE GLUCONATE 0.12 % MT SOLN
15.0000 mL | Freq: Two times a day (BID) | OROMUCOSAL | Status: DC
  Administered 2015-07-04 – 2015-07-08 (×10): 15 mL via OROMUCOSAL
  Filled 2015-07-03 (×10): qty 15

## 2015-07-03 MED ORDER — ONDANSETRON HCL 4 MG/2ML IJ SOLN: 4.0000 mg | Freq: Four times a day (QID) | INTRAMUSCULAR | Status: DC | PRN

## 2015-07-03 NOTE — ED Provider Notes (Signed)
CSN: [id number]     Arrival date & time 07/03/15  1223 History   First MD Initiated Contact with Patient 07/03/15 1234     Chief Complaint  Patient presents with  . Altered Mental Status  . Jaundice     (Consider location/radiation/quality/duration/timing/severity/associated sxs/prior Treatment) HPI Level V caveat altered mental status. History is obtained from her husband who accompanies her Patient with increasing generalized weakness over the past several days. Noted to be more confused today by her husband. Her husband reports that she's been a heavy user of alcohol for several years and has recently cut back. Patient denies pain anywhere. No treatment prior to coming here. Past Medical History  Diagnosis Date  . Esophageal stricture   . HLD (hyperlipidemia)   . Dysthymic disorder   . Other and unspecified coagulation defects   . Diaphragmatic hernia without mention of obstruction or gangrene   . PAD (peripheral artery disease)   . Pulmonary embolism 06/2014    "both lungs"  . DVT (deep venous thrombosis) 06/2014    RLE  . GERD (gastroesophageal reflux disease)   . History of hiatal hernia   . Anxiety   . Malignant neoplasm of breast (female), unspecified site 2002    "right", NO BLOOD PRESSURES OR STICKS IN RIGHT ARM  . SVT (supraventricular tachycardia) 11/07/2014  . On home oxygen therapy     "2L at night and prn" (11/08/2014)  . Atherosclerosis   . Alcohol abuse   . Chronic respiratory failure   . Colitis    Past Surgical History  Procedure Laterality Date  . Appendectomy  12/1977  . Cholecystectomy  12/1977  . Breast lumpectomy Right 01/2001  . Tubal ligation  1980's  . Cardiac catheterization  1990's  . Esophagogastroduodenoscopy (egd) with esophageal dilation  X 7  . Esophagogastroduodenoscopy N/A 06/06/2015    Procedure: ESOPHAGOGASTRODUODENOSCOPY (EGD);  Surgeon: Inda Castle, MD;  Location: [facility];  Service: Endoscopy;  Laterality: N/A;   Family  History  Problem Relation Age of Onset  . Heart disease Father   . Heart attack Father   . Colon cancer Neg Hx   . Lung cancer Maternal Grandfather   . Deep vein thrombosis Daughter    History  Substance Use Topics  . Smoking status: Former Smoker -- 1.50 packs/day for 38 years    Types: Cigarettes    Quit date: 12/08/1998  . Smokeless tobacco: Never Used  . Alcohol Use: 27.6 oz/week    32 Shots of liquor, 14 Standard drinks or equivalent per week     Comment: 11/08/2014 "2-3, 1 1/2shot drinks/night"   OB History    No data available     Review of Systems    Allergies  Ciprofloxacin  Home Medications   Prior to Admission medications   Medication Sig Start Date End Date Taking? Authorizing Provider  ALPRAZolam (XANAX) 0.25 MG tablet Take 0.25 mg by mouth daily.     Historical Provider, MD  clobetasol ointment (TEMOVATE) 0.05 % Apply 7.82 application topically as needed. itching 10/09/14   Historical Provider, MD  escitalopram (LEXAPRO) 10 MG tablet Take 10 mg by mouth daily. 11/24/14   Historical Provider, MD  ivabradine (CORLANOR) 5 MG TABS tablet Take 1 tablet (5 mg total) by mouth 2 (two) times daily with a meal. 03/21/15   Dorothy Spark, MD  loperamide (IMODIUM) 2 MG capsule Take 2 mg by mouth daily as needed for diarrhea or loose stools.    Historical Provider, MD  metoCLOPramide (REGLAN) 10 MG tablet Take 1 tablet (10 mg total) by mouth 3 (three) times daily before meals. 06/06/15   Inda Castle, MD  omeprazole (PRILOSEC) 20 MG capsule Take 1 capsule (20 mg total) by mouth daily. Patient not taking: Reported on 05/24/2015 04/09/15   Amy S Esterwood, PA-C  ondansetron (ZOFRAN-ODT) 4 MG disintegrating tablet Take 1 tablet (4 mg total) by mouth 2 (two) times daily. vomiting Patient taking differently: Take 4 mg by mouth 2 (two) times daily. Take two tablets daily prn for vomiting 04/09/15   Amy S Esterwood, PA-C  OXYGEN Inhale 2 L/min into the lungs at bedtime.     Historical Provider, MD  pantoprazole (PROTONIX) 40 MG tablet Take 40 mg by mouth 2 (two) times daily.    Historical Provider, MD  promethazine (PHENERGAN) 25 MG tablet Take 12.5 mg by mouth every 6 (six) hours as needed for nausea or vomiting.    Historical Provider, MD  rivaroxaban (XARELTO) 20 MG TABS tablet Take 20 mg by mouth daily with supper.    Historical Provider, MD   BP 108/61 mmHg  Pulse 116  Temp(Src) 98.4 F (36.9 C) (Rectal)  Resp 30  SpO2 98% Physical Exam  Constitutional:  Acutely ill-appearing  HENT:  Head: Normocephalic and atraumatic.  Mucous membranes dry  Eyes: Conjunctivae are normal. Pupils are equal, round, and reactive to light. Scleral icterus is present.  Neck: Neck supple. No tracheal deviation present. No thyromegaly present.  Cardiovascular: Regular rhythm.   No murmur heard. Tachycardic  Pulmonary/Chest: Effort normal and breath sounds normal.  Tachypnea  Abdominal: Soft. Bowel sounds are normal. She exhibits no distension. There is no tenderness.  Musculoskeletal: Normal range of motion. She exhibits no edema or tenderness.  Neurological: She is alert. Coordination normal.  Oriented to name and hospital. Grossly tremulous cranial nerves II through XII grossly intact moves all extremities no asterixis  Skin: Skin is warm and dry. No rash noted.  Jaundice  Nursing note and vitals reviewed.   ED Course  Procedures (including critical care time) Labs Review Labs Reviewed  BLOOD GAS, VENOUS  COMPREHENSIVE METABOLIC PANEL  AMMONIA  CBC  PROTIME-INR  URINALYSIS, ROUTINE W REFLEX MICROSCOPIC (NOT AT [facility])    Imaging Review No results found.   EKG Interpretation   Date/Time:  Tuesday July 03 2015 14:58:22 EDT Ventricular Rate:  105 PR Interval:  121 QRS Duration: 89 QT Interval:  405 QTC Calculation: 535 R Axis:   63 Text Interpretation:  Sinus tachycardia Low voltage, extremity and  precordial leads Nonspecific T abnrm, anterolateral  leads Prolonged QT  interval No significant change since last tracing Confirmed by Winfred Leeds   MD, Zehava Turski [phone]) on 07/03/2015 3:21:19 PM     3 PM patient is somnolent arousable to verbal similar such treatment with intravenous Ativan and intravenous fluids Results for orders placed or performed during the hospital encounter of 07/03/15  Blood gas, venous  Result Value Ref Range   pH, Ven 7.498 (H) 7.250 - 7.300   pCO2, Ven 28.1 (L) 45.0 - 50.0 mmHg   pO2, Ven BELOW REPORTABLE RANGE. 30.0 - 45.0 mmHg   Bicarbonate 21.6 20.0 - 24.0 mEq/L   TCO2 20.0 0 - 100 mmol/L   Acid-base deficit 0.6 0.0 - 2.0 mmol/L   O2 Saturation 43.3 %   Patient temperature 98.6    Collection site VENOUS    Drawn by COLLECTED BY LABORATORY    Sample type VENOUS   Comprehensive metabolic panel  Result Value Ref Range   Sodium 141 135 - 145 mmol/L   Potassium 2.9 (L) 3.5 - 5.1 mmol/L   Chloride 99 (L) 101 - 111 mmol/L   CO2 21 (L) 22 - 32 mmol/L   Glucose, Bld 89 65 - 99 mg/dL   BUN 9 6 - 20 mg/dL   Creatinine, Ser 1.05 (H) 0.44 - 1.00 mg/dL   Calcium 8.9 8.9 - 10.3 mg/dL   Total Protein 6.7 6.5 - 8.1 g/dL   Albumin 2.2 (L) 3.5 - 5.0 g/dL   AST 287 (H) 15 - 41 U/L   ALT 98 (H) 14 - 54 U/L   Alkaline Phosphatase 147 (H) 38 - 126 U/L   Total Bilirubin 18.6 (HH) 0.3 - 1.2 mg/dL   GFR calc non Af Amer 53 (L) >60 mL/min   GFR calc Af Amer >60 >60 mL/min   Anion gap 21 (H) 5 - 15  CBC  Result Value Ref Range   WBC 15.2 (H) 4.0 - 10.5 K/uL   RBC 2.51 (L) 3.87 - 5.11 MIL/uL   Hemoglobin 10.6 (L) 12.0 - 15.0 g/dL   HCT 32.3 (L) 36.0 - 46.0 %   MCV 128.7 (H) 78.0 - 100.0 fL   MCH 42.2 (H) 26.0 - 34.0 pg   MCHC 32.8 30.0 - 36.0 g/dL   RDW 21.2 (H) 11.5 - 15.5 %   Platelets 231 150 - 400 K/uL  Protime-INR  Result Value Ref Range   Prothrombin Time 33.3 (H) 11.6 - 15.2 seconds   INR 3.36 (H) 0.00 - 1.49  Urinalysis, Routine w reflex microscopic (not at [facility])  Result Value Ref Range   Color, Urine RED (A)  YELLOW   APPearance CLOUDY (A) CLEAR   Specific Gravity, Urine 1.025 1.005 - 1.030   pH 6.0 5.0 - 8.0   Glucose, UA NEGATIVE NEGATIVE mg/dL   Hgb urine dipstick NEGATIVE NEGATIVE   Bilirubin Urine LARGE (A) NEGATIVE   Ketones, ur 15 (A) NEGATIVE mg/dL   Protein, ur NEGATIVE NEGATIVE mg/dL   Urobilinogen, UA 4.0 (H) 0.0 - 1.0 mg/dL   Nitrite POSITIVE (A) NEGATIVE   Leukocytes, UA SMALL (A) NEGATIVE  Ammonia  Result Value Ref Range   Ammonia 47 (H) 9 - 35 umol/L  Urine microscopic-add on  Result Value Ref Range   Squamous Epithelial / LPF FEW (A) RARE   WBC, UA 3-6 <3 WBC/hpf   Bacteria, UA MANY (A) RARE   Casts HYALINE CASTS (A) NEGATIVE   Ct Head Wo Contrast  07/03/2015   CLINICAL DATA:  Altered mental status/disorientation  EXAM: CT HEAD WITHOUT CONTRAST  TECHNIQUE: Contiguous axial images were obtained from the base of the skull through the vertex without intravenous contrast.  COMPARISON:  May 12, 2015  FINDINGS: There is age related volume loss. There is no intracranial mass, hemorrhage, extra-axial fluid collection, or midline shift. There is relatively mild patchy small vessel disease in the centra semiovale bilaterally. There is no new gray-white compartment lesion. No acute infarct evident. Bony calvarium appears intact. Mastoid air cells are clear.  IMPRESSION: Age related volume loss with patchy periventricular small vessel disease. No intracranial mass, hemorrhage, or acute appearing infarct.   Electronically Signed   By: Lowella Grip III M.D.   On: 07/03/2015 13:54    MDM  Patient felt to be encephalopathic and also an alcohol withdrawal. Spoke with Dr.Chiu Plan admit to stepdown unit Diagnoses #1 acute encephalopathy #2 acute liver failure #3 coagulopathy #4 hypokalemia  Final diagnoses:  None   CRITICAL CARE Performed by: Orlie Dakin Total critical care time: 40 minute Critical care time was exclusive of separately billable procedures and treating other  patients. Critical care was necessary to treat or prevent imminent or life-threatening deterioration. Critical care was time spent personally by me on the following activities: development of treatment plan with patient and/or surrogate as well as nursing, discussions with consultants, evaluation of patient's response to treatment, examination of patient, obtaining history from patient or surrogate, ordering and performing treatments and interventions, ordering and review of laboratory studies, ordering and review of radiographic studies, pulse oximetry and re-evaluation of patient's condition.     Orlie Dakin, MD 07/03/15 [phone]

## 2015-07-03 NOTE — ED Notes (Signed)
Per family. Pt drinks etoh frequently. Went to pcp a week ago and was diagnosed with UTI. Pt appears juandiced and weak. Last drink last night (small airplane bottle). Pt disoriented to place and time.

## 2015-07-03 NOTE — Progress Notes (Addendum)
ANTICOAGULATION CONSULT NOTE - Initial Consult  Pharmacy Consult for Heparin Indication: pulmonary embolus  Allergies  Allergen Reactions  . Ciprofloxacin Hives  . Sulfa Antibiotics Hives    Patient Measurements: Height : 61 inches Weight: 147 lb 14.9 oz (67.1 kg)  Heparin Dosing Weight: 61 kg  Vital Signs: Temp: 98.2 F (36.8 C) (07/26 1638) Temp Source: Oral (07/26 1638) BP: 113/59 mmHg (07/26 1800) Pulse Rate: 102 (07/26 1800)  Labs:  Recent Labs  07/03/15 1257 07/03/15 1714  HGB 10.6*  --   HCT 32.3*  --   PLT 231  --   APTT  --  46*  LABPROT 33.3*  --   INR 3.36*  --   HEPARINUNFRC  --  1.32*  CREATININE 1.05*  --     Estimated Creatinine Clearance: 44.3 mL/min (by C-G formula based on Cr of 1.05).   Medical History: Past Medical History  Diagnosis Date  . Esophageal stricture   . HLD (hyperlipidemia)   . Dysthymic disorder   . Other and unspecified coagulation defects   . Diaphragmatic hernia without mention of obstruction or gangrene   . PAD (peripheral artery disease)   . Pulmonary embolism 06/2014    "both lungs"  . DVT (deep venous thrombosis) 06/2014    RLE  . GERD (gastroesophageal reflux disease)   . History of hiatal hernia   . Anxiety   . Malignant neoplasm of breast (female), unspecified site 2002    "right", NO BLOOD PRESSURES OR STICKS IN RIGHT ARM  . SVT (supraventricular tachycardia) 11/07/2014  . On home oxygen therapy     "2L at night and prn" (11/08/2014)  . Atherosclerosis   . Alcohol abuse   . Chronic respiratory failure   . Colitis     Assessment:  69 yr female with significant PMH including ETOH abuse, HTN, DVT & PE.  Patient on Xarelto PTA.  Presents to ED with confusion.  Patient currently on Xarelto 15mg  daily with reported last dose on 7/25 @ 22:00    Liver enzymes elevated  Pertinent anticoagulation labs:  APTT = 46 sec (therapeutic = 66-102sec)  INR = 3.36  Heparin level = 1.32  Xarelto has a known  effect on both INR and Heparin level, causing both to appear falsely elevated  Until effects of Xarelto have worn off, will monitor IV heparin based on aPTT levels  Goal of Therapy:  Heparin level 0.3-0.7 units/ml aPTT 66-102 seconds Monitor platelets by anticoagulation protocol: Yes   Plan:   No heparin bolus  At 10pm (24hr after last reported Xarelto dose), will begin IV heparin gtt @ 950 units/hr  Check aPTT and heparin level 6 hr after heparin started  Will continue to monitor aPTT and heparin level jointly until heparin level is at or below goal range and correlates with a therapeutic aPTT.  At this point, only monitoring heparin levels will be needed, as effects of Xarelto will be diminished.   Follow CBC daily  Fadia Marlar, Toribio Harbour, PharmD 07/03/2015,6:49 PM

## 2015-07-03 NOTE — Progress Notes (Signed)
Utilization Review completed.  Braxson Hollingsworth RN CM  

## 2015-07-03 NOTE — ED Notes (Signed)
Husband reports pt ETOH abuse; has been given airplane bottles to pt to help manage. One airplane bottle at 1800 yesterday last use.

## 2015-07-03 NOTE — ED Notes (Addendum)
Pt continues to be disoriented. Pt encouraged to straighten arm. Elbow immobilizer attempted. Slight swelling and leaking noted around site; infiltration.  Corinna Gab attempted 2 unsuccessful IV attempts. Matt Quick at bedside attempting [place] IV.

## 2015-07-03 NOTE — ED Notes (Signed)
No infiltration noted at new IV site post bolus restart; restarting potassium at present time.

## 2015-07-03 NOTE — ED Notes (Signed)
Pt transported to CT; will collect urine with pt return.

## 2015-07-03 NOTE — ED Notes (Signed)
Jacubowitz at bedside.

## 2015-07-03 NOTE — H&P (Signed)
[facility] History and Physical  Theresa [name] BTY:[id number] DOB: 04-26-1946 DOA: 07/03/2015  Referring physician: Emergency Department PCP:  Melinda Crutch, MD  Specialists:   Chief Complaint: confusion  HPI: Theresa [name] is a 69 y.o. female  With a hx of significant active ETOH abuse, HLD, HTN, DVT with PE on xarelto, hepatic steatosis who presents to the ED with increased confusion. Pt noted to ingest large amounts of ETOH on a daily basis. Pt has been recently trying to cut back ETOH intake but continues to drink. In the ED, alk phos noted to be elevated at 147 with ast of 287 and alt of 98 and total bili of 18.6. Ammonia noted to be 47. Head CT was unremarkable. Pt was started on CIWA and hospitalist consulted for admission.  Review of Systems: Review of Systems  Unable to perform ROS: mental acuity     Past Medical History  Diagnosis Date  . Esophageal stricture   . HLD (hyperlipidemia)   . Dysthymic disorder   . Other and unspecified coagulation defects   . Diaphragmatic hernia without mention of obstruction or gangrene   . PAD (peripheral artery disease)   . Pulmonary embolism 06/2014    "both lungs"  . DVT (deep venous thrombosis) 06/2014    RLE  . GERD (gastroesophageal reflux disease)   . History of hiatal hernia   . Anxiety   . Malignant neoplasm of breast (female), unspecified site 2002    "right", NO BLOOD PRESSURES OR STICKS IN RIGHT ARM  . SVT (supraventricular tachycardia) 11/07/2014  . On home oxygen therapy     "2L at night and prn" (11/08/2014)  . Atherosclerosis   . Alcohol abuse   . Chronic respiratory failure   . Colitis    Past Surgical History  Procedure Laterality Date  . Appendectomy  12/1977  . Cholecystectomy  12/1977  . Breast lumpectomy Right 01/2001  . Tubal ligation  1980's  . Cardiac catheterization  1990's  . Esophagogastroduodenoscopy (egd) with esophageal dilation  X 7  . Esophagogastroduodenoscopy N/A 06/06/2015   Procedure: ESOPHAGOGASTRODUODENOSCOPY (EGD);  Surgeon: Inda Castle, MD;  Location: [facility];  Service: Endoscopy;  Laterality: N/A;   Social History:  reports that she quit smoking about 16 years ago. Her smoking use included Cigarettes. She has a 57 pack-year smoking history. She has never used smokeless tobacco. She reports that she drinks about 27.6 oz of alcohol per week. She reports that she does not use illicit drugs.  where does patient live--home, ALF, SNF? and with whom if at home?  Can patient participate in ADLs?  Allergies  Allergen Reactions  . Ciprofloxacin Hives  . Sulfa Antibiotics Hives    Family History  Problem Relation Age of Onset  . Heart disease Father   . Heart attack Father   . Colon cancer Neg Hx   . Lung cancer Maternal Grandfather   . Deep vein thrombosis Daughter     (be sure to complete)  Prior to Admission medications   Medication Sig Start Date End Date Taking? Authorizing Provider  ALPRAZolam (XANAX) 0.25 MG tablet Take 0.25 mg by mouth daily.    Yes Historical Provider, MD  cephALEXin (KEFLEX) 500 MG capsule Take 500 mg by mouth 3 (three) times daily. 06/24/15  Yes Historical Provider, MD  clobetasol ointment (TEMOVATE) 0.05 % Apply 7.74 application topically as needed. itching 10/09/14  Yes Historical Provider, MD  DULoxetine (CYMBALTA) 30 MG capsule Take 30 mg by mouth daily.  06/13/15  Yes Historical Provider, MD  escitalopram (LEXAPRO) 10 MG tablet Take 10 mg by mouth daily. 11/24/14  Yes Historical Provider, MD  ivabradine (CORLANOR) 5 MG TABS tablet Take 1 tablet (5 mg total) by mouth 2 (two) times daily with a meal. 03/21/15  Yes Dorothy Spark, MD  loperamide (IMODIUM) 2 MG capsule Take 2 mg by mouth daily as needed for diarrhea or loose stools.   Yes Historical Provider, MD  metoCLOPramide (REGLAN) 10 MG tablet Take 1 tablet (10 mg total) by mouth 3 (three) times daily before meals. 06/06/15  Yes Inda Castle, MD  pantoprazole  (PROTONIX) 40 MG tablet Take 40 mg by mouth 2 (two) times daily.   Yes Historical Provider, MD  XARELTO 15 MG TABS tablet Take 15 mg by mouth daily. 06/25/15  Yes Historical Provider, MD  Fe Fum-FA-B Cmp-C-Zn-Mg-Mn-Cu (CENTRATEX) 106-1 MG CAPS Take 1 tablet by mouth daily. 06/25/15   Historical Provider, MD  omeprazole (PRILOSEC) 20 MG capsule Take 1 capsule (20 mg total) by mouth daily. Patient not taking: Reported on 05/24/2015 04/09/15   Amy S Esterwood, PA-C  ondansetron (ZOFRAN-ODT) 4 MG disintegrating tablet Take 1 tablet (4 mg total) by mouth 2 (two) times daily. vomiting Patient not taking: Reported on 07/03/2015 04/09/15   Alfredia Ferguson, PA-C   Physical Exam: Filed Vitals:   07/03/15 1354 07/03/15 1400 07/03/15 1416 07/03/15 1430  BP: 100/67 129/85 129/85 122/61  Pulse: 103 108 104 105  Temp:      TempSrc:      Resp: 32 $Remove'28 24 20  'nOBMGsW$ SpO2: 85% 99% 97% 94%     General:  Lethargic, appears in nad  Eyes: PERRL B, jaundiced  ENT: membranes moist, dentition fair  Neck: trachea midline, neck supple  Cardiovascular: regular, s1, s2  Respiratory: normal resp effort, no wheezing  Abdomen: soft, nondistended  Skin: jaundiced, normal skin turgor, no abnormal skin lesion seen  Musculoskeletal: perfused, no clubbing  Psychiatric: mood/affect normal// no auditory/visual halucinations  Neurologic: cn2-12 grossly intact, strength/sensation intact  Labs on Admission:  Basic Metabolic Panel:  Recent Labs Lab 07/03/15 1257  NA 141  K 2.9*  CL 99*  CO2 21*  GLUCOSE 89  BUN 9  CREATININE 1.05*  CALCIUM 8.9   Liver Function Tests:  Recent Labs Lab 07/03/15 1257  AST 287*  ALT 98*  ALKPHOS 147*  BILITOT 18.6*  PROT 6.7  ALBUMIN 2.2*   No results for input(s): LIPASE, AMYLASE in the last 168 hours.  Recent Labs Lab 07/03/15 1418  AMMONIA 47*   CBC:  Recent Labs Lab 07/03/15 1257  WBC 15.2*  HGB 10.6*  HCT 32.3*  MCV 128.7*  PLT 231   Cardiac Enzymes: No  results for input(s): CKTOTAL, CKMB, CKMBINDEX, TROPONINI in the last 168 hours.  BNP (last 3 results) No results for input(s): BNP in the last 8760 hours.  ProBNP (last 3 results)  Recent Labs  11/08/14 1348  PROBNP 219.1*    CBG: No results for input(s): GLUCAP in the last 168 hours.  Radiological Exams on Admission: Ct Head Wo Contrast  07/03/2015   CLINICAL DATA:  Altered mental status/disorientation  EXAM: CT HEAD WITHOUT CONTRAST  TECHNIQUE: Contiguous axial images were obtained from the base of the skull through the vertex without intravenous contrast.  COMPARISON:  May 12, 2015  FINDINGS: There is age related volume loss. There is no intracranial mass, hemorrhage, extra-axial fluid collection, or midline shift. There is relatively mild patchy  small vessel disease in the centra semiovale bilaterally. There is no new gray-white compartment lesion. No acute infarct evident. Bony calvarium appears intact. Mastoid air cells are clear.  IMPRESSION: Age related volume loss with patchy periventricular small vessel disease. No intracranial mass, hemorrhage, or acute appearing infarct.   Electronically Signed   By: Lowella Grip III M.D.   On: 07/03/2015 13:54    Assessment/Plan Active Problems:   History of pulmonary embolism   Alcohol abuse   Alcohol withdrawal   Hepatic coma/encephalopathy   Hyperammonemia   1. Hepatic encephalopathy 1. Elevated ammonia of 47 2. As pt seems lethargic at present, will cont with lactulose enemas to ensure 2-3 BM daily 3. As pt is encephalopathic, will keep NPO for now 4. Admit to stepdown 2. ETOH abuse/withdrawal 1. CIWA started in ED. Will continue 2. Monitor closely for withdrawal symptoms 3. Last intake was at 6pm on 7/25 3. Hx DVT/PE 1. On therapeutic xarelto prior to admit 2. As pt is NPO, will order heparin gtt per pharmacy 3. INR elevated, see below 4. Hypokalemia 1. Secondary to ETOH abuse 2. Will replace 3. Will check Mg  level 5. Elevated INR 1. Question if related to ETOH liver disease vs drug related finding from xarelto 2. Of note, INR was 2.59 on 11/20/14 while pt was taking xarelto, thus INR may be normally elevated 3. Discussed case with pharmacy. Since 6. DVT prophylaxis 1. Heparin gtt  Code Status: Full Family Communication: Pt in room Disposition Plan: Admit stepdown   [place] [place] [facility] Pager [phone]  If 7PM-7AM, please contact night-coverage [email or web address] Password [facility] 07/03/2015, 3:13 PM

## 2015-07-04 ENCOUNTER — Inpatient Hospital Stay (HOSPITAL_COMMUNITY): Payer: Medicare Other | Admitting: Certified Registered"

## 2015-07-04 ENCOUNTER — Inpatient Hospital Stay (HOSPITAL_COMMUNITY): Payer: Medicare Other

## 2015-07-04 DIAGNOSIS — D689 Coagulation defect, unspecified: Secondary | ICD-10-CM

## 2015-07-04 DIAGNOSIS — J9600 Acute respiratory failure, unspecified whether with hypoxia or hypercapnia: Secondary | ICD-10-CM | POA: Insufficient documentation

## 2015-07-04 DIAGNOSIS — K7010 Alcoholic hepatitis without ascites: Secondary | ICD-10-CM

## 2015-07-04 DIAGNOSIS — E7220 Disorder of urea cycle metabolism, unspecified: Secondary | ICD-10-CM

## 2015-07-04 DIAGNOSIS — F1010 Alcohol abuse, uncomplicated: Secondary | ICD-10-CM

## 2015-07-04 DIAGNOSIS — G9340 Encephalopathy, unspecified: Secondary | ICD-10-CM

## 2015-07-04 LAB — BLOOD GAS, ARTERIAL
ACID-BASE DEFICIT: 3.6 mmol/L — AB (ref 0.0–2.0)
ACID-BASE DEFICIT: 1.3 mmol/L (ref 0.0–2.0)
BICARBONATE: 23.6 meq/L (ref 20.0–24.0)
Bicarbonate: 23 mEq/L (ref 20.0–24.0)
DRAWN BY: 295031
DRAWN BY: 295031
FIO2: 1
FIO2: 1
O2 SAT: 96.6 %
O2 Saturation: 86.8 %
PATIENT TEMPERATURE: 98.6
PATIENT TEMPERATURE: 98.6
PCO2 ART: 51.9 mmHg — AB (ref 35.0–45.0)
PCO2 ART: 43.1 mmHg (ref 35.0–45.0)
PEEP/CPAP: 5 cmH2O
PO2 ART: 63.5 mmHg — AB (ref 80.0–100.0)
PO2 ART: 93.6 mmHg (ref 80.0–100.0)
RATE: 16 resp/min
TCO2: 22 mmol/L (ref 0–100)
TCO2: 22.1 mmol/L (ref 0–100)
VT: 400 mL
pH, Arterial: 7.269 — ABNORMAL LOW (ref 7.350–7.450)
pH, Arterial: 7.357 (ref 7.350–7.450)

## 2015-07-04 LAB — AMMONIA: Ammonia: 39 umol/L — ABNORMAL HIGH (ref 9–35)

## 2015-07-04 LAB — COMPREHENSIVE METABOLIC PANEL
ALT: 71 U/L — ABNORMAL HIGH (ref 14–54)
ANION GAP: 8 (ref 5–15)
AST: 211 U/L — ABNORMAL HIGH (ref 15–41)
Albumin: 1.8 g/dL — ABNORMAL LOW (ref 3.5–5.0)
Alkaline Phosphatase: 121 U/L (ref 38–126)
BILIRUBIN TOTAL: 16.3 mg/dL — AB (ref 0.3–1.2)
BUN: 10 mg/dL (ref 6–20)
CHLORIDE: 110 mmol/L (ref 101–111)
CO2: 27 mmol/L (ref 22–32)
CREATININE: 0.71 mg/dL (ref 0.44–1.00)
Calcium: 7.4 mg/dL — ABNORMAL LOW (ref 8.9–10.3)
GLUCOSE: 55 mg/dL — AB (ref 65–99)
POTASSIUM: 3.6 mmol/L (ref 3.5–5.1)
Sodium: 145 mmol/L (ref 135–145)
TOTAL PROTEIN: 5.3 g/dL — AB (ref 6.5–8.1)

## 2015-07-04 LAB — RAPID URINE DRUG SCREEN, HOSP PERFORMED
AMPHETAMINES: NOT DETECTED
Barbiturates: NOT DETECTED
Benzodiazepines: POSITIVE — AB
Cocaine: NOT DETECTED
OPIATES: NOT DETECTED
Tetrahydrocannabinol: NOT DETECTED

## 2015-07-04 LAB — CBC
HEMATOCRIT: 25.6 % — AB (ref 36.0–46.0)
Hemoglobin: 8.9 g/dL — ABNORMAL LOW (ref 12.0–15.0)
MCH: 44.3 pg — AB (ref 26.0–34.0)
MCHC: 34.8 g/dL (ref 30.0–36.0)
MCV: 127.4 fL — ABNORMAL HIGH (ref 78.0–100.0)
Platelets: 176 10*3/uL (ref 150–400)
RBC: 2.01 MIL/uL — ABNORMAL LOW (ref 3.87–5.11)
RDW: 21.5 % — AB (ref 11.5–15.5)
WBC: 13.7 10*3/uL — ABNORMAL HIGH (ref 4.0–10.5)

## 2015-07-04 LAB — PROTIME-INR
INR: 2.83 — ABNORMAL HIGH (ref 0.00–1.49)
PROTHROMBIN TIME: 29.3 s — AB (ref 11.6–15.2)

## 2015-07-04 LAB — GLUCOSE, CAPILLARY
GLUCOSE-CAPILLARY: 217 mg/dL — AB (ref 65–99)
GLUCOSE-CAPILLARY: 93 mg/dL (ref 65–99)
Glucose-Capillary: 110 mg/dL — ABNORMAL HIGH (ref 65–99)
Glucose-Capillary: 50 mg/dL — ABNORMAL LOW (ref 65–99)

## 2015-07-04 LAB — HEPARIN LEVEL (UNFRACTIONATED)
Heparin Unfractionated: 0.65 IU/mL (ref 0.30–0.70)
Heparin Unfractionated: 0.38 IU/mL (ref 0.30–0.70)

## 2015-07-04 LAB — PROCALCITONIN: PROCALCITONIN: 0.61 ng/mL

## 2015-07-04 LAB — LACTIC ACID, PLASMA: Lactic Acid, Venous: 1.9 mmol/L (ref 0.5–2.0)

## 2015-07-04 LAB — APTT: aPTT: 195 seconds — ABNORMAL HIGH (ref 24–37)

## 2015-07-04 MED ORDER — SODIUM CHLORIDE 0.9 % IV BOLUS (SEPSIS)
500.0000 mL | Freq: Once | INTRAVENOUS | Status: AC
  Administered 2015-07-04: 500 mL via INTRAVENOUS

## 2015-07-04 MED ORDER — FOLIC ACID 5 MG/ML IJ SOLN
1.0000 mg | Freq: Every day | INTRAMUSCULAR | Status: DC
  Administered 2015-07-04 – 2015-07-10 (×7): 1 mg via INTRAVENOUS
  Filled 2015-07-04 (×9): qty 0.2

## 2015-07-04 MED ORDER — DEXTROSE-NACL 5-0.45 % IV SOLN
INTRAVENOUS | Status: AC
  Administered 2015-07-04: 08:00:00 via INTRAVENOUS
  Filled 2015-07-04 (×2): qty 1000

## 2015-07-04 MED ORDER — LIDOCAINE HCL (CARDIAC) 20 MG/ML IV SOLN
INTRAVENOUS | Status: AC
  Filled 2015-07-04: qty 5

## 2015-07-04 MED ORDER — LACTULOSE 10 GM/15ML PO SOLN
20.0000 g | Freq: Three times a day (TID) | ORAL | Status: DC
  Administered 2015-07-04 – 2015-07-06 (×8): 20 g via ORAL
  Filled 2015-07-04 (×8): qty 30

## 2015-07-04 MED ORDER — RANITIDINE HCL 150 MG/10ML PO SYRP
150.0000 mg | ORAL_SOLUTION | Freq: Two times a day (BID) | ORAL | Status: DC
Start: 2015-07-04 — End: 2015-07-05
  Administered 2015-07-04: 150 mg via ORAL
  Filled 2015-07-04 (×3): qty 10

## 2015-07-04 MED ORDER — ETOMIDATE 2 MG/ML IV SOLN
INTRAVENOUS | Status: AC
  Filled 2015-07-04: qty 20

## 2015-07-04 MED ORDER — PROPOFOL 10 MG/ML IV BOLUS: INTRAVENOUS | Status: DC | PRN

## 2015-07-04 MED ORDER — SUCCINYLCHOLINE CHLORIDE 20 MG/ML IJ SOLN
INTRAMUSCULAR | Status: AC
  Filled 2015-07-04: qty 1

## 2015-07-04 MED ORDER — DEXTROSE 5 % IV SOLN
1.0000 g | INTRAVENOUS | Status: DC
  Administered 2015-07-04 – 2015-07-05 (×2): 1 g via INTRAVENOUS
  Filled 2015-07-04 (×2): qty 10

## 2015-07-04 MED ORDER — FENTANYL CITRATE (PF) 100 MCG/2ML IJ SOLN
25.0000 ug | INTRAMUSCULAR | Status: DC | PRN
  Administered 2015-07-04 – 2015-07-13 (×18): 25 ug via INTRAVENOUS
  Filled 2015-07-04 (×17): qty 2

## 2015-07-04 MED ORDER — RIFAXIMIN 200 MG PO TABS
400.0000 mg | ORAL_TABLET | Freq: Three times a day (TID) | ORAL | Status: DC
  Administered 2015-07-04 – 2015-07-15 (×33): 400 mg
  Filled 2015-07-04 (×38): qty 2

## 2015-07-04 MED ORDER — MAGNESIUM SULFATE 2 GM/50ML IV SOLN
2.0000 g | Freq: Once | INTRAVENOUS | Status: AC
  Administered 2015-07-04: 2 g via INTRAVENOUS
  Filled 2015-07-04: qty 50

## 2015-07-04 MED ORDER — PROPOFOL 10 MG/ML IV BOLUS
INTRAVENOUS | Status: DC | PRN
  Administered 2015-07-04: 100 mg via INTRAVENOUS

## 2015-07-04 MED ORDER — SUCCINYLCHOLINE CHLORIDE 20 MG/ML IJ SOLN
INTRAMUSCULAR | Status: DC | PRN
  Administered 2015-07-04: 100 mg via INTRAVENOUS

## 2015-07-04 MED ORDER — PROPOFOL 10 MG/ML IV BOLUS
INTRAVENOUS | Status: AC
  Filled 2015-07-04: qty 20

## 2015-07-04 MED ORDER — FAMOTIDINE 40 MG/5ML PO SUSR
20.0000 mg | Freq: Two times a day (BID) | ORAL | Status: DC
  Filled 2015-07-04: qty 2.5

## 2015-07-04 MED ORDER — ROCURONIUM BROMIDE 50 MG/5ML IV SOLN
INTRAVENOUS | Status: AC
  Filled 2015-07-04: qty 2

## 2015-07-04 MED ORDER — SUCCINYLCHOLINE CHLORIDE 20 MG/ML IJ SOLN: INTRAMUSCULAR | Status: DC | PRN

## 2015-07-04 MED ORDER — PREDNISOLONE 15 MG/5ML PO SOLN
40.0000 mg | Freq: Every day | ORAL | Status: DC
  Administered 2015-07-05: 40 mg
  Filled 2015-07-04 (×3): qty 15

## 2015-07-04 MED ORDER — SODIUM CHLORIDE 0.45 % IV SOLN
INTRAVENOUS | Status: DC
  Filled 2015-07-04 (×2): qty 1000

## 2015-07-04 MED ORDER — DEXTROSE 50 % IV SOLN
INTRAVENOUS | Status: AC
  Administered 2015-07-04: 09:00:00
  Filled 2015-07-04: qty 50

## 2015-07-04 MED ORDER — VITAMIN K1 10 MG/ML IJ SOLN
10.0000 mg | Freq: Every day | INTRAVENOUS | Status: DC
  Administered 2015-07-04: 10 mg via INTRAVENOUS
  Filled 2015-07-04: qty 1

## 2015-07-04 MED ORDER — VITAMINS A & D EX OINT
TOPICAL_OINTMENT | CUTANEOUS | Status: AC
  Administered 2015-07-04: 1
  Filled 2015-07-04: qty 5

## 2015-07-04 MED ORDER — SODIUM CHLORIDE 0.9 % IV BOLUS (SEPSIS)
250.0000 mL | Freq: Once | INTRAVENOUS | Status: AC
  Administered 2015-07-04: 250 mL via INTRAVENOUS

## 2015-07-04 NOTE — Progress Notes (Signed)
Initial Nutrition Assessment  DOCUMENTATION CODES:   Not applicable  INTERVENTION:  - Will monitor for needs with diet advancement  NUTRITION DIAGNOSIS:   Inadequate oral intake related to inability to eat as evidenced by NPO status.  GOAL:   Patient will meet greater than or equal to 90% of their needs  MONITOR:   Diet advancement, Weight trends, Labs, I & O's  REASON FOR ASSESSMENT:   Malnutrition Screening Tool  ASSESSMENT:  68 y.o. 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 Pt noted to ingest large amounts of ETOH on a daily basis. Pt has been recently trying to cut back ETOH intake but continues to drink. In the ED, alk phos noted to be elevated at 147 with ast of 287 and alt of 98 and total bili of 18.6. Ammonia noted to be 47. Head CT was unremarkable. Pt was started on CIWA and hospitalist consulted for admission.  Pt seen for MST. BMI indicates overweight status. She has been NPO since admission. No family present in the room at time of visit and RN reports husband should be back later today; RD will be in a meeting and unable to return to pt's room later this afternoon.  During visit, pt with full body shaking/tremors. Unable to perform physical assessment. Per weight hx review, pt has lost 8 lbs (5% body weight) in the past 4 months which is not significant for time frame.  Not meeting needs. Medications reviewed. Labs reviewed; Ca: 7.4 mmol/L. Last drink: 7/25 PM.   Diet Order:  NPO  Skin:   (jaundice)  Last BM:  7/26  Height:   Ht Readings from Last 1 Encounters:  06/06/15 5' 1" (1.549 m)    Weight:   Wt Readings from Last 1 Encounters:  07/03/15 147 lb 14.9 oz (67.1 kg)    Ideal Body Weight:  47.73 kg (kg)  BMI:  Body mass index is 27.97 kg/(m^2).  Estimated Nutritional Needs:   Kcal:  1400-1600  Protein:  55-65 grams  Fluid:   2.2-2.5 L/day  EDUCATION NEEDS:   No education needs identified at this time      , RD, LDN Inpatient Clinical Dietitian Pager # [phone] After hours/weekend pager # [phone]  

## 2015-07-04 NOTE — Consult Note (Signed)
PULMONARY / CRITICAL CARE MEDICINE   Name: Theresa [name] MRN: [id number] DOB: 10-30-1946    ADMISSION DATE:  07/03/2015 CONSULTATION DATE:  07/04/15  REFERRING MD :  Dr. 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  The patient was recently diagnosed with UTI by PCP.  Admitted per [facility] for hepatic encephalopathy, ETOH withdrawal.  The patient subsequently decompensated on the afternoon of 7/27 and required intubation by anesthesia.  PCCM consulted for evaluation.   STUDIES:  7/26  CT head >> no ICH, mass or acute infarct  SIGNIFICANT EVENTS: 7/26  Admit with altered mental status, hepatic encephalopathy.  Recent UTI  7/27  Decompensation requiring intubation, PCCM consulted    HISTORY OF PRESENT ILLNESS:  69 y/o F with a PMH of HLD, HTN, DVT / PE on Xarelto, hepatic steatosis, esophageal strictures and ETOH abuse who was admitted 7/26 with weakness & confusion.    Of note, the patient was recently diagnosed with UTI by PCP.  Her husband reports that she continues to drink alcohol.  On Monday 7/25, he noticed she was more confused which worsening overnight.  She got uup multiple times and had to be re-directed for normal routine activities like going to the bathroom.  He notes that she has been drinking more heavily over the past few months - a 1/2 gallon of tequila will last 3-4 days.  The confusion is what prompted him to come to the ER.  Initial evaluation, noted elevated LFT's, ammonia, hypoglycemia and INR. Admitted per [facility] for hepatic encephalopathy, ETOH withdrawal.  She was seen by [facility].  The patient became progressively more lethargic over the course of admit.  Subsequently, she decompensated on the afternoon of 7/27 and required intubation by anesthesia.  PCCM consulted for evaluation.    PAST MEDICAL HISTORY  :   has a past medical history of Esophageal stricture; HLD (hyperlipidemia); Dysthymic disorder; Other and unspecified coagulation defects; Diaphragmatic hernia without mention of obstruction or gangrene; PAD (peripheral artery disease); Pulmonary embolism (06/2014); DVT (deep venous thrombosis) (06/2014); GERD (gastroesophageal reflux disease); History of hiatal hernia; Anxiety; Malignant neoplasm of breast (female), unspecified site (2002); SVT (supraventricular tachycardia) (11/07/2014); On home oxygen therapy; Atherosclerosis; Alcohol abuse; Chronic respiratory failure; and Colitis.  has past surgical history that includes Appendectomy (12/1977); Cholecystectomy (12/1977); Breast lumpectomy (Right, 01/2001); Tubal ligation (1980's); Cardiac catheterization (1990's); Esophagogastroduodenoscopy (egd) with esophageal dilation (X 7); and Esophagogastroduodenoscopy (N/A, 06/06/2015).   Prior to Admission medications   Medication Sig Start Date End Date Taking? Authorizing Provider  ALPRAZolam (XANAX) 0.25 MG tablet Take 0.25 mg by mouth daily.    Yes Historical Provider, MD  cephALEXin (KEFLEX) 500 MG capsule Take 500 mg by mouth 3 (three) times daily. 06/24/15  Yes Historical Provider, MD  clobetasol ointment (TEMOVATE) 0.05 % Apply 5.39 application topically as needed. itching 10/09/14  Yes Historical Provider, MD  DULoxetine (CYMBALTA) 30 MG capsule Take 30 mg by mouth daily. 06/13/15  Yes Historical Provider, MD  escitalopram (LEXAPRO) 10 MG tablet Take 10 mg by mouth daily. 11/24/14  Yes Historical Provider, MD  ivabradine (CORLANOR) 5 MG TABS tablet Take 1 tablet (5 mg total) by mouth 2 (two) times daily with a meal. 03/21/15  Yes Dorothy Spark, MD  loperamide (IMODIUM) 2 MG capsule Take 2 mg by mouth daily  as needed for diarrhea or loose stools.   Yes Historical Provider, MD  metoCLOPramide (REGLAN) 10 MG tablet Take 1 tablet (10 mg total) by mouth 3 (three) times daily before meals. 06/06/15  Yes Inda Castle, MD  pantoprazole (PROTONIX) 40 MG tablet Take 40 mg by mouth 2 (two) times daily.   Yes Historical Provider, MD  XARELTO 15 MG TABS tablet Take 15 mg by mouth daily. 06/25/15  Yes Historical Provider, MD  Fe Fum-FA-B Cmp-C-Zn-Mg-Mn-Cu (CENTRATEX) 106-1 MG CAPS Take 1 tablet by mouth daily. 06/25/15   Historical Provider, MD  omeprazole (PRILOSEC) 20 MG capsule Take 1 capsule (20 mg total) by mouth daily. Patient not taking: Reported on 05/24/2015 04/09/15   Amy S Esterwood, PA-C  ondansetron (ZOFRAN-ODT) 4 MG disintegrating tablet Take 1 tablet (4 mg total) by mouth 2 (two) times daily. vomiting Patient not taking: Reported on 07/03/2015 04/09/15   Amy S Esterwood, PA-C   Allergies  Allergen Reactions  . Ciprofloxacin Hives  . Sulfa Antibiotics Hives    FAMILY HISTORY:  has no family status information on file.    SOCIAL HISTORY:  reports that she quit smoking about 16 years ago. Her smoking use included Cigarettes. She has a 57 pack-year smoking history. She has never used smokeless tobacco. She reports that she drinks about 27.6 oz of alcohol per week. She reports that she does not use illicit drugs.  REVIEW OF SYSTEMS:  Unable to complete as patient is altered on mechanical ventilation.   SUBJECTIVE:   VITAL SIGNS: Temp:  [97.3 F (36.3 C)-98.3 F (36.8 C)] 97.3 F (36.3 C) (07/27 1200) Pulse Rate:  [94-108] 94 (07/27 1425) Resp:  [15-35] 35 (07/27 1425) BP: (91-128)/(23-95) 128/59 mmHg (07/27 1425) SpO2:  [85 %-100 %] 95 % (07/27 1425) Weight:  [147 lb 14.9 oz (67.1 kg)] 147 lb 14.9 oz (67.1 kg) (07/26 1605) HEMODYNAMICS:   VENTILATOR SETTINGS:   INTAKE / OUTPUT:  Intake/Output Summary (Last 24 hours) at 07/04/15 1558 Last data filed at 07/04/15 1500  Gross per 24 hour  Intake 2339.99 ml  Output    500 ml  Net 1839.99 ml    PHYSICAL EXAMINATION: General:  Chronically ill appearing female, jaundiced  Neuro:  Obtunded on vent, pupils 61mm=, R HEENT:  OETT, mm  pink/moist, no jvd, scleral icterus  Cardiovascular:  s1s2 rrr, no m/r/g Lungs:  resp's eve/non-labored, lungs bilaterally coarse  Abdomen:  Obese/soft, bsx4 hypoactive  Musculoskeletal:  No acute deformities  Skin:  Warm/dry, no edema, jaundiced  LABS:  CBC  Recent Labs Lab 07/03/15 1257 07/04/15 0400  WBC 15.2* 13.7*  HGB 10.6* 8.9*  HCT 32.3* 25.6*  PLT 231 176   Coag's  Recent Labs Lab 07/03/15 1257 07/03/15 1714 07/04/15 0400  APTT  --  46* 195*  INR 3.36*  --  2.83*   BMET  Recent Labs Lab 07/03/15 1257 07/04/15 0400  NA 141 145  K 2.9* 3.6  CL 99* 110  CO2 21* 27  BUN 9 10  CREATININE 1.05* 0.71  GLUCOSE 89 55*   Electrolytes  Recent Labs Lab 07/03/15 1257 07/03/15 1714 07/04/15 0400  CALCIUM 8.9  --  7.4*  MG  --  1.5*  --    Sepsis Markers No results for input(s): LATICACIDVEN, PROCALCITON, O2SATVEN in the last 168 hours. ABG  Recent Labs Lab 07/04/15 1323  PHART 7.269*  PCO2ART 51.9*  PO2ART 63.5*   Liver Enzymes  Recent Labs Lab 07/03/15 1257 07/04/15 0400  AST 287* 211*  ALT 98* 71*  ALKPHOS 147* 121  BILITOT 18.6* 16.3*  ALBUMIN 2.2* 1.8*   Cardiac Enzymes No results for input(s): TROPONINI, PROBNP in the last 168 hours. Glucose  Recent Labs Lab 07/04/15 0811  GLUCAP 50*    Imaging No results found.   ASSESSMENT / PLAN:  PULMONARY OETT 7/27 >> A: Acute Hypercarbic Respiratory Failure - in the setting of hepatic encephalopathy  Hx PE - on Xarelto  P:   MV Support, 8 cc/kg Wean PEEP / FiO2 for saturations > 92% CXR post intubation and in am  ABG in one hour and in am  PRN albuterol  VAP prevention measures  Anticoagulation as below  CARDIOVASCULAR CVL A:  Tachycardia  Hx HLD, SVT, PAD P:  ICU monitoring of hemodynamics  Heparin gtt per pharmacy for DVT/PE  PICC line ordered per [facility]  RENAL A:   Hypokalemia Hypomagnesemia  P:   Trend BMP  Replace electrolytes as  indicated  GASTROINTESTINAL A:   ETOH  Abdominal Pain, Nausea / Vomiting - seen by GI Severe Protein Calorie Malnutrition  Hx Esophageal Stricture s/p Dilation (06/06/15)  P:   GI Consulted NPO Consider TF in am 7/28 Pepcid BID for stress ulcer prophylaxis   HEMATOLOGIC / ONCOLOGY  A:   Coagulopathy  Anemia  Chronic Anticoagulation for PE/DVT Hx Infiltrating Ductal Carcinoma (2002) P:  Trend CBC Hold heparin gtt per pharmacy, consider restart in am 7/28 Vitamin K x1 7/27 x 3 days (per GI) >> ? Elevation of INR due to Xarelto vs hepatic disease Assess [place] abd, hepatitis panel   INFECTIOUS A:   UTI  P:   UC 7/27 >>   Rocephin, start date 7/27, day 1/x Trend fever curve / WBC  ENDOCRINE A:   Hypoglycemia    P:   D51/2 NS at 18ml/hr CBG Q6 while NPO in the setting of liver dysfunction   NEUROLOGIC A:   Hepatic Encephalopathy  ETOH Abuse / Withdrawal  P:   RASS goal: 0 Allow patient to wake Lactulose PT Xifaxan PT  Thiamine / folate / MVI PRN Ativan per CIWA  FAMILY  - Updates: No family at bedside upon examination.    - Inter-disciplinary family meet or Palliative Care meeting due by: 8/1    Noe Gens, NP-C [facility] Pgr: [phone] or if no answer [phone] 07/04/2015, 3:58 PM

## 2015-07-04 NOTE — Progress Notes (Signed)
ANTICOAGULATION CONSULT NOTE - Follow Up Consult  Pharmacy Consult for Heparin Indication: pulmonary embolus  Allergies  Allergen Reactions  . Ciprofloxacin Hives  . Sulfa Antibiotics Hives    Patient Measurements: Weight: 147 lb 14.9 oz (67.1 kg) Heparin Dosing Weight:   Vital Signs: Temp: 98.3 F (36.8 C) (07/27 0000) Temp Source: Axillary (07/27 0000) BP: 95/49 mmHg (07/27 0600) Pulse Rate: 94 (07/27 0600)  Labs:  Recent Labs  07/03/15 1257 07/03/15 1714 07/04/15 0400 07/04/15 0405  HGB 10.6*  --  8.9*  --   HCT 32.3*  --  25.6*  --   PLT 231  --  176  --   APTT  --  46* 195*  --   LABPROT 33.3*  --  29.3*  --   INR 3.36*  --  2.83*  --   HEPARINUNFRC  --  1.32*  --  0.65  CREATININE 1.05*  --  0.71  --     Estimated Creatinine Clearance: 58.1 mL/min (by C-G formula based on Cr of 0.71).   Medications:  Infusions:  . sodium chloride 75 mL/hr at 07/03/15 1900  . heparin 950 Units/hr (07/03/15 2149)    Assessment: Patient with heparin level at goal and PTT elevated above goal.  Therefore, at this time I am confident that the prior to admission rivaroxaban is no longer affecting anti-Xa (heparin levels) and will use only heparin levels to monitor patient.  No heparin issues noted.  Goal of Therapy:  Heparin level 0.3-0.7 units/ml Monitor platelets by anticoagulation protocol: Yes   Plan:  Continue heparin drip at current rate Recheck level at [address], [place] 07/04/2015,6:13 AM

## 2015-07-04 NOTE — Anesthesia Procedure Notes (Deleted)
Procedures

## 2015-07-04 NOTE — Consult Note (Addendum)
Referring Provider: [facility] Primary Care Physician:   Melinda Crutch, MD Primary Gastroenterologist:  Dr. Deatra Ina  Reason for Consultation:   Abnormal LFTs, encephalopathy      HPI: Theresa [name] is a 69 y.o. female who is known to Dr. Deatra Ina. She has been seen in the office twice this year with complaints of abdominal discomfort and ongoing nausea. Initially it was thought her symptoms may be secondary to polypharmacy. She had had an EGD in 2012 with dilation of an esophageal stricture and had a colonoscopy in 2012 which was normal. 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 The tumor was positive for estrogenic progesterone receptors and negative for age ER-2/neu), DVT, PE in July 2015 and has been on Xarelto. She also has a history of chronic alcohol use. She had a CT of the abdomen and pelvis in December 2015 which showed diffuse hepatic steatosis and severe degenerative changes in the lumbar spine and a moderate hiatal hernia. She had an EGD 06/06/2015 that showed a stricture at the GE junction. She was scheduled to be seen in the GI office this Friday for dysphagia per her husband.  Patient's husband states that the patient was noted to be somewhat confused Monday night when she got up multiple times to go to the bathroom and walking in the opposite direction of the bathroom, needing to be read directed. Yesterday she seemed to be confused as well and so he brought her to the emergency room. He reports that she has been drinking very heavily for the last few months. He states a half gallon of tequila will last her 3 or 4 days. When brought to the emergency room she was noted to have elevated liver test with an alkaline phosphatase of 147, AST 287, ALT 98, total bili 18.6. Ammonia was 47. Her INR has also been  elevated.   Past Medical History  Diagnosis Date  . Esophageal stricture   . HLD (hyperlipidemia)   . Dysthymic disorder   . Other and unspecified coagulation defects   . Diaphragmatic hernia without mention of obstruction or gangrene   . PAD (peripheral artery disease)   . Pulmonary embolism 06/2014    "both lungs"  . DVT (deep venous thrombosis) 06/2014    RLE  . GERD (gastroesophageal reflux disease)   . History of hiatal hernia   . Anxiety   . Malignant neoplasm of breast (female), unspecified site 2002    "right", NO BLOOD PRESSURES OR STICKS IN RIGHT ARM  . SVT (supraventricular tachycardia) 11/07/2014  . On home oxygen therapy     "2L at night and prn" (11/08/2014)  . Atherosclerosis   . Alcohol abuse   . Chronic respiratory failure   . Colitis     Past Surgical History  Procedure Laterality Date  . Appendectomy  12/1977  . Cholecystectomy  12/1977  . Breast lumpectomy Right 01/2001  . Tubal ligation  1980's  . Cardiac catheterization  1990's  . Esophagogastroduodenoscopy (egd) with esophageal dilation  X 7  . Esophagogastroduodenoscopy N/A 06/06/2015    Procedure: ESOPHAGOGASTRODUODENOSCOPY (EGD);  Surgeon: Inda Castle, MD;  Location: [facility];  Service: Endoscopy;  Laterality: N/A;    Prior to Admission medications   Medication Sig Start Date End Date Taking? Authorizing Provider  ALPRAZolam (XANAX) 0.25 MG tablet Take 0.25 mg by mouth daily.  Yes Historical Provider, MD  cephALEXin (KEFLEX) 500 MG capsule Take 500 mg by mouth 3 (three) times daily. 06/24/15  Yes Historical Provider, MD  clobetasol ointment (TEMOVATE) 0.05 % Apply 3.29 application topically as needed. itching 10/09/14  Yes Historical Provider, MD  DULoxetine (CYMBALTA) 30 MG capsule Take 30 mg by mouth daily. 06/13/15  Yes Historical Provider, MD  escitalopram (LEXAPRO) 10 MG tablet Take 10 mg by mouth daily. 11/24/14  Yes Historical Provider, MD  ivabradine (CORLANOR) 5 MG TABS tablet Take 1  tablet (5 mg total) by mouth 2 (two) times daily with a meal. 03/21/15  Yes Dorothy Spark, MD  loperamide (IMODIUM) 2 MG capsule Take 2 mg by mouth daily as needed for diarrhea or loose stools.   Yes Historical Provider, MD  metoCLOPramide (REGLAN) 10 MG tablet Take 1 tablet (10 mg total) by mouth 3 (three) times daily before meals. 06/06/15  Yes Inda Castle, MD  pantoprazole (PROTONIX) 40 MG tablet Take 40 mg by mouth 2 (two) times daily.   Yes Historical Provider, MD  XARELTO 15 MG TABS tablet Take 15 mg by mouth daily. 06/25/15  Yes Historical Provider, MD  Fe Fum-FA-B Cmp-C-Zn-Mg-Mn-Cu (CENTRATEX) 106-1 MG CAPS Take 1 tablet by mouth daily. 06/25/15   Historical Provider, MD  omeprazole (PRILOSEC) 20 MG capsule Take 1 capsule (20 mg total) by mouth daily. Patient not taking: Reported on 05/24/2015 04/09/15   Amy S Esterwood, PA-C  ondansetron (ZOFRAN-ODT) 4 MG disintegrating tablet Take 1 tablet (4 mg total) by mouth 2 (two) times daily. vomiting Patient not taking: Reported on 07/03/2015 04/09/15   Amy S Esterwood, PA-C    Current Facility-Administered Medications  Medication Dose Route Frequency Provider Last Rate Last Dose  . antiseptic oral rinse (CPC / CETYLPYRIDINIUM CHLORIDE 0.05%) solution 7 mL  7 mL Mouth Rinse q12n4p Donne Hazel, MD      . cefTRIAXone (ROCEPHIN) 1 g in dextrose 5 % 50 mL IVPB  1 g Intravenous Q24H Nishant Dhungel, MD   1 g at 07/04/15 1005  . chlorhexidine (PERIDEX) 0.12 % solution 15 mL  15 mL Mouth Rinse BID Donne Hazel, MD   15 mL at 07/04/15 0900  . dextrose 5 % and 0.45% NaCl 1,000 mL infusion   Intravenous Continuous Nishant Dhungel, MD 75 mL/hr at 07/04/15 0821    . folic acid (FOLVITE) tablet 1 mg  1 mg Oral Daily Orlie Dakin, MD   1 mg at 07/03/15 1316  . heparin ADULT infusion 100 units/mL (25000 units/250 mL)  950 Units/hr Intravenous Continuous Leann T Poindexter, RPH 9.5 mL/hr at 07/03/15 2149 950 Units/hr at 07/03/15 2149  . lactulose  (CHRONULAC) enema 200 gm  300 mL Rectal Daily Donne Hazel, MD   300 mL at 07/04/15 1107  . LORazepam (ATIVAN) injection 0-4 mg  0-4 mg Intravenous Q6H Orlie Dakin, MD   2 mg at 07/04/15 0617   Followed by  . [START ON 07/05/2015] LORazepam (ATIVAN) injection 0-4 mg  0-4 mg Intravenous Q12H Orlie Dakin, MD      . LORazepam (ATIVAN) tablet 1 mg  1 mg Oral Q6H PRN Orlie Dakin, MD       Or  . LORazepam (ATIVAN) injection 1 mg  1 mg Intravenous Q6H PRN Orlie Dakin, MD      . LORazepam (ATIVAN) injection 2-3 mg  2-3 mg Intravenous Q1H PRN Donne Hazel, MD      . multivitamin with minerals tablet 1 tablet  1 tablet Oral Daily Orlie Dakin, MD   1 tablet at 07/03/15 1316  . ondansetron (ZOFRAN) tablet 4 mg  4 mg Oral Q6H PRN Donne Hazel, MD       Or  . ondansetron [facility]) injection 4 mg  4 mg Intravenous Q6H PRN Donne Hazel, MD      . sodium chloride 0.45 % 1,000 mL infusion   Intravenous Continuous Nishant Dhungel, MD      . sodium chloride 0.9 % injection 3 mL  3 mL Intravenous Q12H Donne Hazel, MD   3 mL at 07/03/15 2151  . thiamine (VITAMIN B-1) tablet 100 mg  100 mg Oral Daily Orlie Dakin, MD       Or  . thiamine (B-1) injection 100 mg  100 mg Intravenous Daily Orlie Dakin, MD   100 mg at 07/04/15 1155    Allergies as of 07/03/2015 - Review Complete 07/03/2015  Allergen Reaction Noted  . Ciprofloxacin Hives 06/19/2011  . Sulfa antibiotics Hives 07/03/2015    Family History  Problem Relation Age of Onset  . Heart disease Father   . Heart attack Father   . Colon cancer Neg Hx   . Lung cancer Maternal Grandfather   . Deep vein thrombosis Daughter     History   Social History  . Marital Status: Married    Spouse Name: N/A  . Number of Children: 2  . Years of Education: N/A   Occupational History  . Unemployed    Social History Main Topics  . Smoking status: Former Smoker -- 1.50 packs/day for 38 years    Types: Cigarettes    Quit date:  12/08/1998  . Smokeless tobacco: Never Used  . Alcohol Use: 27.6 oz/week    32 Shots of liquor, 14 Standard drinks or equivalent per week     Comment: 11/08/2014 "2-3, 1 1/2shot drinks/night"  . Drug Use: No  . Sexual Activity: Yes    Birth Control/ Protection: None   Other Topics Concern  . Not on file   Social History Narrative    Review of Systems: Unable to obtain as patient is somnolent and not answering questions.  Physical Exam: Vital signs in last 24 hours: Temp:  [97.3 F (36.3 C)-98.3 F (36.8 C)] 97.3 F (36.3 C) (07/27 1200) Pulse Rate:  [94-108] 96 (07/27 1100) Resp:  [15-32] 26 (07/27 1100) BP: (91-129)/(23-95) 107/58 mmHg (07/27 1100) SpO2:  [85 %-100 %] 100 % (07/27 1100) Weight:  [147 lb 14.9 oz (67.1 kg)] 147 lb 14.9 oz (67.1 kg) (07/26 1605) Last BM Date: 07/03/15 General:   Somnolent, moaning, grossly icteric Head:  Normocephalic and atraumatic. Eyes:  Sclera anicteric  Conjunctiva pink. Nose:  No deformity, discharge,  or lesions. Mouth:  No deformity or lesions.   Neck:  Supple; no masses or thyromegaly. Lungs:  Clear throughout to auscultation.    Heart:  Regular rate and rhythm  Abdomen:  Soft,nontender, BS active,nonpalp mass or hsm.   Rectal:  Scant brown stool, tests heme negative Msk:  Symmetrical without gross deformities. . Pulses:  Normal pulses noted. Extremities: No lower extremity edema. Bruising on her upper arms noted Neurologic somnolent Skin: Bruises on upper extremities Psych: somnolent  Intake/Output from previous day: 07/26 0701 - 07/27 0700 In: 1705.7 [I.V.:905.7; IV Piggyback:800] Out: -  Intake/Output this shift: Total I/O In: -  Out: 500 [Urine:500]  Lab Results:  Recent Labs  07/03/15 1257 07/04/15 0400  WBC 15.2* 13.7*  HGB 10.6*  8.9*  HCT 32.3* 25.6*  PLT 231 176   BMET  Recent Labs  07/03/15 1257 07/04/15 0400  NA 141 145  K 2.9* 3.6  CL 99* 110  CO2 21* 27  GLUCOSE 89 55*  BUN 9 10   CREATININE 1.05* 0.71  CALCIUM 8.9 7.4*   LFT  Recent Labs  07/04/15 0400  PROT 5.3*  ALBUMIN 1.8*  AST 211*  ALT 71*  ALKPHOS 121  BILITOT 16.3*   PT/INR  Recent Labs  07/03/15 1257 07/04/15 0400  LABPROT 33.3* 29.3*  INR 3.36* 2.83*   Urine drug screen 07/04/2015 positive for benzodiazepines Ammonia 07/04/2015  47 Urinalysis 07/03/2015 large bilirubin, positive nitrite, small leukocyte Urine culture pending Tylenol level 07/03/15  less than 10.  Procedures: EGD 06/06/15: ENDOSCOPIC IMPRESSION: 1. There was a stricture at the gastroesophageal junction 2. small hiatal hernia 2. EGD was otherwise normal RECOMMENDATIONS: 1. empiric trial of Reglan 10 mg one half-hour before meals and at bedtime 2. Schedule EGD with balloon dilation of distal esophageal stricture (Xarelto will have to be held if approved by cardiology) REPEAT EXAM: eSigned: Inda Castle, MD 06/06/2015 9:31 AM   08/2011: Colonoscopy, Dr Deatra Ina For diarrhea and hx microscopic colitis Grossly normal. Rx with empiric entocort  Pathology: Plasma cell infiltrate, superficial neutrophilic crypt/lamina propria infiltration. Features highly suggestive of infectious colitis.   07/2011 EGD Balloon dilatation of distal esophageal stricture, HH.  12/2010 EGD Balloon dilatation of distal esophageal stricture.   09/2010 EGD ENDOSCOPIC IMPRESSION:  1) Erosions, multiple in the antrum  2) Stricture at the gastroesophageal junction  3) Otherwise normal examination  RECOMMENDATIONS:  1) continue PPI  2) endoscopy with dilitation 3-4 weeks; hold plavix for 7 days  prior to procedure  3) resume plavix in 7 days  04/2010, 03/2009, 09/2005 EGD  Balloon dilatation of distal esophageal stricture, HH.  03/2009 Flex sig: for diarrhea.  Diffuse moderateerythema c/w colitis Pathology: Sigmoid colitis. There are a few areas of minimal active neutrophilic  inflammation predominantly involving the superficial epithelium. The underlying lamina propria shows mildly increased infiltrate and there are increased intraepithelial lymphocytes within the crypt epithelium and the overlying superficial epithelium. findings are usually associated with lymphocytic colitis; however, endoscopic abnormalities and active inflammation are not usually features of lymphocytic colitis. No granulomas or significant chronic changes are identified. Also, the active inflammation in this case is mild, focal and predominantly superficial which can be indicative of bowel prep related changes.  09/2005 Colonoscopy For diarrhea. Normal Study Studies/Results: Ct Head Wo Contrast  07/03/2015   CLINICAL DATA:  Altered mental status/disorientation  EXAM: CT HEAD WITHOUT CONTRAST  TECHNIQUE: Contiguous axial images were obtained from the base of the skull through the vertex without intravenous contrast.  COMPARISON:  May 12, 2015  FINDINGS: There is age related volume loss. There is no intracranial mass, hemorrhage, extra-axial fluid collection, or midline shift. There is relatively mild patchy small vessel disease in the centra semiovale bilaterally. There is no new gray-white compartment lesion. No acute infarct evident. Bony calvarium appears intact. Mastoid air cells are clear.  IMPRESSION: Age related volume loss with patchy periventricular small vessel disease. No intracranial mass, hemorrhage, or acute appearing infarct.   Electronically Signed   By: Lowella Grip III M.D.   On: 07/03/2015 13:54   CT ABDOMEN AND PELVIS WITHOUT CONTRAST  TECHNIQUE: Multidetector CT imaging of the abdomen and pelvis was performed following the standard protocol without IV contrast.  COMPARISON: Prior study from 04/30/2015  FINDINGS: Mild subpleural fibrotic changes noted within the partially visualized lung bases. Visualized lung bases are otherwise clear. Coronary  artery calcifications partially visualized. No pleural or pericardial effusion.  Severe hypoattenuation of the liver is compatible with steatosis. Gallbladder is absent. No biliary dilatation. Spleen, adrenal glands, and pancreas demonstrate a normal unenhanced appearance.  Scattered calcific densities within the bilateral renal hila favored to be vascular in nature. No definite nephrolithiasis. No hydronephrosis. No focal renal lesions identified on this noncontrast examination.  Moderate hiatal hernia present. Stomach within normal limits. Small bowel of normal caliber and appearance without associated inflammatory changes. There is circumferential wall thickening about the cecum and ascending colon without associated inflammatory stranding. Colon otherwise within normal limits.  Bladder largely decompressed but grossly normal. Uterus and ovaries within normal limits for patient age.  The no free air or fluid. No pathologically enlarged intra-abdominal or pelvic lymph nodes identified. Advanced atheromatous plaque present within the intra-abdominal aorta and its branch vessels. No aneurysm.  There is scattered inflammatory fat stranding within the left inguinal region extending anteriorly towards the upper left thigh (series 2, image 87). Finding may reflect mild contusion related to recent fall.  No acute fracture or other osseous abnormality. No worrisome lytic or blastic osseous lesions. Scoliosis with multilevel degenerative disc disease and facet arthropathy noted within the visualized spine.  IMPRESSION: 1. Mild soft tissue stranding within the subcutaneous fat of the left inguinal region extending laterally towards the anterior aspect of the upper left thigh. Finding may reflect mild contusion given history of fall. Correlation with physical exam recommended. 2. No other acute traumatic injury within the abdomen and pelvis. 3. Mild circumferential wall  thickening about the cecum and ascending colon. Correlation for possible infectious or inflammatory colitis recommended. Additionally, correlation with colonoscopy as clinically warranted. 4. Hepatic steatosis. 5. Advanced atheromatous plaque throughout the intra-abdominal aorta and its branch vessels with diffuse 3 vessel coronary artery calcifications. No aneurysm. 6. Moderate hiatal hernia. 7. Scoliosis with advanced multilevel degenerative disc disease and facet arthropathy.   Electronically Signed  By: Jeannine Boga M.D.  On: 05/12/2015 22:57   EXAM: MRI ABDOMEN AND PELVIS WITHOUT AND WITH CONTRAST  TECHNIQUE: Multiplanar multisequence MR imaging of the abdomen and pelvis was performed both before and after the administration of intravenous contrast.  CONTRAST: 90mL MULTIHANCE GADOBENATE DIMEGLUMINE 529 MG/ML IV SOLN  COMPARISON: CT abdomen pelvis dated 11/20/2014.  FINDINGS: Lower chest: Lung bases are clear.  Hepatobiliary: Severe hepatic steatosis. Liver is otherwise within normal limits. No suspicious/enhancing hepatic lesions.  Status post cholecystectomy. No intrahepatic ductal dilatation. Common duct is mildly dilated, measuring 10 mm, but smoothly tapers at the ampulla.  Pancreas: Within normal limits.  Spleen: Lobular splenic contour.  Adrenals/Urinary Tract: Adrenal glands are within normal limits.  Kidneys are within normal limits. No hydronephrosis  Stomach/Bowel: Small hiatal hernia.  No evidence of bowel obstruction.  Mild wall thickening involving the cecum/ascending colon, correlate for infectious/inflammatory colitis.  Vascular/Lymphatic: No evidence of abdominal aortic aneurysm.  No suspicious abdominopelvic lymphadenopathy.  Reproductive: Uterus is unremarkable.  Bilateral ovaries are within normal limits.  Other: No abdominopelvic ascites.  Musculoskeletal: Degenerative changes of the lumbar  spine. S-shaped lumbar scoliosis. No focal osseous lesions.  IMPRESSION: Severe hepatic steatosis.  Status post cholecystectomy. Common duct measures 10 mm and smoothly tapers at the ampulla, likely postsurgical.  Mild wall thickening involving the cecum/ascending colon, correlate for infectious/inflammatory colitis. Correlate with colonoscopy as clinically warranted.   Electronically Signed  By: Bertis Ruddy  [place] Pink M.D.  On: 04/30/2015 14:07  IMPRESSION/PLAN: #1. Acute alcoholic hepatitis with elevated INR. Patient has had imaging that showed severe fatty liver in the past and she may have underlying cirrhosis as well. Hold Xarelto. Ultrasound of abdomen pending. Follow transaminases, bilirubin, alkaline phosphatase and INR closely. Continue CIWA protocol. Hepatitis panel pending. Will add Vit K 10 qd x 3 days to see if it helps correct INR.   #2. Change in mental status. Likely hepatic encephalopathy from alcohol intoxication. Patient continues to be somnolent. Received lactulose enema today. Would continue lactulose and when patient able to take by mouth at [facility].  #3. UTI. Urine culture pending. On Rocephin empirically.  #4. History of DVT/PE. Was previously on Xarelto as an outpatient. Currently on heparin drip.  #5. Protein calorie malnutrition. Likely secondary to alcoholism.  #6. History dysphagia. Patient's husband states patient has been complaining of dysphagia. Recent EGD showed a stricture at GE junction which was not dilated because patient was on Xarelto. Will reassess when patient no longer encephalopathic.(Colon can likely be addressed at later date as well.)   Hvozdovic, Deloris Ping 07/04/2015,  Pager [phone]     Attending physician's note   I have taken a history, examined the patient and reviewed the chart. I agree with the Advanced Practitioner's note, impression and recommendations. Acute alcoholic hepatitis with severe fatty liver noted in the  past. Encephalopathy and etoh intoxication. etoh withdrawal. Discriminate function today is 66, above 32 indicates a potential benefit from corticosteroids. Give Vit K. Will recheck PT/INR tomorrow to be sure Xarelto and/or Vit K deficiency is not contributing to PT/INR elevation. Await abd [place]. Hold Xarelto. Cecum/ascending colon wall thickening on imaging may require elective colonoscopy and dysphagia may require elective EGD with dilation, both likely as an outpatient, when pt fully recovers from this acute illness.   Ladene Artist, MD Marval Regal [phone]

## 2015-07-04 NOTE — Progress Notes (Signed)
ANTICOAGULATION CONSULT NOTE - Follow up  Pharmacy Consult for Heparin Indication: pulmonary embolus  Allergies  Allergen Reactions  . Ciprofloxacin Hives  . Sulfa Antibiotics Hives    Patient Measurements: Height : 61 inches Weight: 147 lb 14.9 oz (67.1 kg)  Heparin Dosing Weight: 61 kg  Vital Signs: Temp: 97.3 F (36.3 C) (07/27 1200) Temp Source: Axillary (07/27 1200) BP: 107/58 mmHg (07/27 1100) Pulse Rate: 96 (07/27 1100)  Labs:  Recent Labs  07/03/15 1257 07/03/15 1714 07/04/15 0400 07/04/15 0405 07/04/15 1128  HGB 10.6*  --  8.9*  --   --   HCT 32.3*  --  25.6*  --   --   PLT 231  --  176  --   --   APTT  --  46* 195*  --   --   LABPROT 33.3*  --  29.3*  --   --   INR 3.36*  --  2.83*  --   --   HEPARINUNFRC  --  1.32*  --  0.65 0.38  CREATININE 1.05*  --  0.71  --   --     Estimated Creatinine Clearance: 58.1 mL/min (by C-G formula based on Cr of 0.71).   Medical History: Past Medical History  Diagnosis Date  . Esophageal stricture   . HLD (hyperlipidemia)   . Dysthymic disorder   . Other and unspecified coagulation defects   . Diaphragmatic hernia without mention of obstruction or gangrene   . PAD (peripheral artery disease)   . Pulmonary embolism 06/2014    "both lungs"  . DVT (deep venous thrombosis) 06/2014    RLE  . GERD (gastroesophageal reflux disease)   . History of hiatal hernia   . Anxiety   . Malignant neoplasm of breast (female), unspecified site 2002    "right", NO BLOOD PRESSURES OR STICKS IN RIGHT ARM  . SVT (supraventricular tachycardia) 11/07/2014  . On home oxygen therapy     "2L at night and prn" (11/08/2014)  . Atherosclerosis   . Alcohol abuse   . Chronic respiratory failure   . Colitis     Assessment: 69 yr female with significant PMH including ETOH abuse, HTN, DVT & PE.  Patient on Xarelto 15 mg daily PTA, last dose 7/25 at 2200.  Presents to ED with hepatic encephalopathy.  Pharmacy consulted to initiate heparin  infusion while patient is NPO.   Heparin level = 0.38, therapeutic but trending down now that effects of Xarelto on heparin level have worn off.  Infusion currently at 950 units/hr.  Monitoring heparin levels only now and aPTT levels have been d/c'd.  Hgb 8.9, platelets 176 No bleeding/complications reported.  CrCl~58 ml/min INR elevated - could be secondary to a false elevation caused by Xarelto and/or secondary to hepatic disease.  LFTs elevated.  Goal of Therapy:  Heparin level 0.3-0.7 units/ml Monitor platelets by anticoagulation protocol: Yes   Plan:   Continue heparin gtt @ 950 units/hr  Repeat heparin level this evening to make sure level doesn't continue to trend down below therapeutic range.  Follow CBC daily  Hershal Coria, PharmD 07/04/2015,12:24 PM

## 2015-07-04 NOTE — Progress Notes (Signed)
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 Patient drinking heavily on a regular basis. In the ED she had transaminitis with mildly elevated total bilirubin and ammonia 47. Patient admitted to stepdown for close monitoring.   Assessment/Plan: Acute hepatic encephalopathy Secondary to alcohol intoxication. Monitoring stepdown. Patient still encephalopathic with minimal improvement in symptoms. Continue daily lactulose enema. Check ultrasound abdomen, monitor dated the ammonia level, LFTs . Check acute hepatitis panel. -Check alcohol metabolites. Urine drug screen positive for benzos only. -Continue neuro checks.  Acute hepatitis with elevated INR Likely alcoholic. Hepatitis panel pending. Maddrey's discriminant score >32 ( high INR more likely in the setting of liver disease rather than due to Xarelto). Discussed with [facility]. Would hold off on stable use until she is evaluated. Possibly has underlying cirrhosis. Check repeat ultrasound of the abdomen. Monitor LFTs and INR closely.  Acute alcohol intoxication Monitor on CIWA. Continue IV thimaine.  Hypoglycemia Received D50 this morning. Switch fluids to D5 half-normal saline.   UTI Send urine culture. Placed on empiric Rocephin.  History of DVT/PE On Xarelto as outpatient. Placed on heparin drip.  Hypokalemia/ hypomagnesemia Replenished.  Protein calorie malnutrition Needs nutritional eval once taking po    DVT prophylaxis: On IV heparin  Diet: Nothing by mouth  Code Status: Full code  Family Communication: Husband at bedside. Discussed in detail about plan. He has been buying her alcohol and have instructed that patient should not be drinking from now on. Husband  also reports that patient not compliant with all her medications.  Disposition Plan: Continue step down monitoring.   Consultants:  [facility]   Procedures:  [place] abd ( ordered)  Antibiotics:  rocephin  HPI/Subjective: Patient seen and examined. Still quite encephalopathic trying to open her eyes to commands. Noted for low blood glucose this morning.  Objective: Filed Vitals:   07/04/15 1200  BP:   Pulse:   Temp: 97.3 F (36.3 C)  Resp:     Intake/Output Summary (Last 24 hours) at 07/04/15 1243 Last data filed at 07/04/15 0600  Gross per 24 hour  Intake 1705.74 ml  Output      0 ml  Net 1705.74 ml   Filed Weights   07/03/15 1605  Weight: 67.1 kg (147 lb 14.9 oz)    Exam:   General: Elderly female lying in bed somnolent with some movement to commands attempting eye opening, appears grossly icteric  HEENT: No pallor, dilated pupils, icteric,  Cardiovascular: Normal S1 and S2, no murmurs rub or gallop  Chest: Clear to auscultation bilaterally, no added sounds  GI: Soft, nondistended, nontender, bowel sounds present  Musculoskeletal: Warm, no edema  CNS: Somnolent  Data Reviewed: Basic Metabolic Panel:  Recent Labs Lab 07/03/15 1257 07/03/15 1714 07/04/15 0400  NA 141  --  145  K 2.9*  --  3.6  CL 99*  --  110  CO2 21*  --  27  GLUCOSE 89  --  55*  BUN 9  --  10  CREATININE 1.05*  --  0.71  CALCIUM 8.9  --  7.4*  MG  --  1.5*  --    Liver Function Tests:  Recent Labs Lab 07/03/15 1257 07/04/15 0400  AST 287* 211*  ALT 98* 71*  ALKPHOS 147* 121  BILITOT 18.6* 16.3*  PROT 6.7 5.3*  ALBUMIN 2.2* 1.8*   No results for input(s): LIPASE, AMYLASE in the last 168 hours.  Recent Labs Lab 07/03/15 1418 07/04/15 0400  AMMONIA 47* 39*   CBC:  Recent Labs Lab 07/03/15 1257 07/04/15 0400  WBC 15.2* 13.7*  HGB 10.6* 8.9*  HCT 32.3* 25.6*  MCV 128.7* 127.4*  PLT 231 176   Cardiac Enzymes: No results for input(s): CKTOTAL, CKMB,  CKMBINDEX, TROPONINI in the last 168 hours. BNP (last 3 results) No results for input(s): BNP in the last 8760 hours.  ProBNP (last 3 results)  Recent Labs  11/08/14 1348  PROBNP 219.1*    CBG:  Recent Labs Lab 07/04/15 0811  GLUCAP 50*    Recent Results (from the past 240 hour(s))  MRSA PCR Screening     Status: None   Collection Time: 07/03/15  4:34 PM  Result Value Ref Range Status   MRSA by PCR NEGATIVE NEGATIVE Final    Comment:        The GeneXpert MRSA Assay (FDA approved for NASAL specimens only), is one component of a comprehensive MRSA colonization surveillance program. It is not intended to diagnose MRSA infection nor to guide or monitor treatment for MRSA infections.      Studies: Ct Head Wo Contrast  07/03/2015   CLINICAL DATA:  Altered mental status/disorientation  EXAM: CT HEAD WITHOUT CONTRAST  TECHNIQUE: Contiguous axial images were obtained from the base of the skull through the vertex without intravenous contrast.  COMPARISON:  May 12, 2015  FINDINGS: There is age related volume loss. There is no intracranial mass, hemorrhage, extra-axial fluid collection, or midline shift. There is relatively mild patchy small vessel disease in the centra semiovale bilaterally. There is no new gray-white compartment lesion. No acute infarct evident. Bony calvarium appears intact. Mastoid air cells are clear.  IMPRESSION: Age related volume loss with patchy periventricular small vessel disease. No intracranial mass, hemorrhage, or acute appearing infarct.   Electronically Signed   By: Lowella Grip III M.D.   On: 07/03/2015 13:54    Scheduled Meds: . antiseptic oral rinse  7 mL Mouth Rinse q12n4p  . cefTRIAXone (ROCEPHIN)  IV  1 g Intravenous Q24H  . chlorhexidine  15 mL Mouth Rinse BID  . folic acid  1 mg Oral Daily  . lactulose  300 mL Rectal Daily  . LORazepam  0-4 mg Intravenous Q6H   Followed by  . [START ON 07/05/2015] LORazepam  0-4 mg Intravenous Q12H   . magnesium sulfate 1 - 4 g bolus IVPB  2 g Intravenous Once  . multivitamin with minerals  1 tablet Oral Daily  . sodium chloride  3 mL Intravenous Q12H  . thiamine  100 mg Oral Daily   Or  . thiamine  100 mg Intravenous Daily   Continuous Infusions: . dextrose 5 % and 0.45% NaCl 1,000 mL infusion 75 mL/hr at 07/04/15 0821  . heparin 950 Units/hr (07/03/15 2149)  . sodium chloride 0.45 % 1,000 mL infusion       Time spent: 35 minutes  Shaketha Jeon, [place]  [facility] Pager [phone] If 7PM-7AM, please contact night-coverage at [email or web address], password [facility] 07/04/2015, 12:43 PM  LOS: 1 day

## 2015-07-04 NOTE — Anesthesia Procedure Notes (Signed)
Procedure Name: Intubation Date/Time: 07/04/2015 3:50 PM Performed by: Noralyn Pick D Pre-anesthesia Checklist: Patient identified, Emergency Drugs available, Suction available and Patient being monitored Patient Re-evaluated:Patient Re-evaluated prior to inductionOxygen Delivery Method: Ambu bag Preoxygenation: Pre-oxygenation with 100% oxygen Intubation Type: IV induction, Rapid sequence and Cricoid Pressure applied Laryngoscope Size: Mac and 3 Grade View: Grade II Tube type: Subglottic suction tube Tube size: 7.0 mm Number of attempts: 1 Airway Equipment and Method: Stylet and Oral airway Placement Confirmation: ETT inserted through vocal cords under direct vision,  positive ETCO2 and breath sounds checked- equal and bilateral Secured at: 20 cm Tube secured with: Tape Dental Injury: Teeth and Oropharynx as per pre-operative assessment

## 2015-07-05 ENCOUNTER — Inpatient Hospital Stay (HOSPITAL_COMMUNITY): Payer: Medicare Other

## 2015-07-05 DIAGNOSIS — D689 Coagulation defect, unspecified: Secondary | ICD-10-CM

## 2015-07-05 DIAGNOSIS — E440 Moderate protein-calorie malnutrition: Secondary | ICD-10-CM | POA: Insufficient documentation

## 2015-07-05 DIAGNOSIS — J9602 Acute respiratory failure with hypercapnia: Secondary | ICD-10-CM

## 2015-07-05 LAB — PROTIME-INR
INR: 1.63 — AB (ref 0.00–1.49)
Prothrombin Time: 19.4 seconds — ABNORMAL HIGH (ref 11.6–15.2)

## 2015-07-05 LAB — BLOOD GAS, ARTERIAL
ACID-BASE DEFICIT: 0.9 mmol/L (ref 0.0–2.0)
Bicarbonate: 23.1 mEq/L (ref 20.0–24.0)
Drawn by: 11249
FIO2: 0.6
LHR: 16 {breaths}/min
MECHVT: 400 mL
O2 SAT: 98.6 %
PEEP/CPAP: 5 cmH2O
Patient temperature: 98.4
TCO2: 21.7 mmol/L (ref 0–100)
pCO2 arterial: 37.7 mmHg (ref 35.0–45.0)
pH, Arterial: 7.404 (ref 7.350–7.450)
pO2, Arterial: 111 mmHg — ABNORMAL HIGH (ref 80.0–100.0)

## 2015-07-05 LAB — COMPREHENSIVE METABOLIC PANEL
ALK PHOS: 132 U/L — AB (ref 38–126)
ALT: 67 U/L — ABNORMAL HIGH (ref 14–54)
ANION GAP: 9 (ref 5–15)
AST: 186 U/L — ABNORMAL HIGH (ref 15–41)
Albumin: 1.8 g/dL — ABNORMAL LOW (ref 3.5–5.0)
BUN: 10 mg/dL (ref 6–20)
CO2: 24 mmol/L (ref 22–32)
Calcium: 7.3 mg/dL — ABNORMAL LOW (ref 8.9–10.3)
Chloride: 109 mmol/L (ref 101–111)
Creatinine, Ser: 0.46 mg/dL (ref 0.44–1.00)
GLUCOSE: 100 mg/dL — AB (ref 65–99)
Potassium: 3.5 mmol/L (ref 3.5–5.1)
SODIUM: 142 mmol/L (ref 135–145)
Total Bilirubin: 15.5 mg/dL — ABNORMAL HIGH (ref 0.3–1.2)
Total Protein: 5.4 g/dL — ABNORMAL LOW (ref 6.5–8.1)

## 2015-07-05 LAB — LACTIC ACID, PLASMA: Lactic Acid, Venous: 1.6 mmol/L (ref 0.5–2.0)

## 2015-07-05 LAB — CBC
HCT: 27.5 % — ABNORMAL LOW (ref 36.0–46.0)
HEMOGLOBIN: 9.5 g/dL — AB (ref 12.0–15.0)
MCH: 44.2 pg — AB (ref 26.0–34.0)
MCHC: 34.5 g/dL (ref 30.0–36.0)
MCV: 127.9 fL — ABNORMAL HIGH (ref 78.0–100.0)
PLATELETS: 181 10*3/uL (ref 150–400)
RBC: 2.15 MIL/uL — ABNORMAL LOW (ref 3.87–5.11)
RDW: 20.5 % — ABNORMAL HIGH (ref 11.5–15.5)
WBC: 15 10*3/uL — ABNORMAL HIGH (ref 4.0–10.5)

## 2015-07-05 LAB — GLUCOSE, CAPILLARY
GLUCOSE-CAPILLARY: 99 mg/dL (ref 65–99)
GLUCOSE-CAPILLARY: 112 mg/dL — AB (ref 65–99)
Glucose-Capillary: 109 mg/dL — ABNORMAL HIGH (ref 65–99)
Glucose-Capillary: 105 mg/dL — ABNORMAL HIGH (ref 65–99)
Glucose-Capillary: 94 mg/dL (ref 65–99)

## 2015-07-05 LAB — PROCALCITONIN: PROCALCITONIN: 0.51 ng/mL

## 2015-07-05 LAB — URINE CULTURE: CULTURE: NO GROWTH

## 2015-07-05 LAB — ALCOHOL METABOLITE (ETG), URINE

## 2015-07-05 LAB — APTT: aPTT: 37 seconds (ref 24–37)

## 2015-07-05 MED ORDER — HEPARIN SODIUM (PORCINE) 5000 UNIT/ML IJ SOLN
5000.0000 [IU] | Freq: Three times a day (TID) | INTRAMUSCULAR | Status: DC
  Administered 2015-07-05 – 2015-07-08 (×11): 5000 [IU] via SUBCUTANEOUS
  Filled 2015-07-05 (×11): qty 1

## 2015-07-05 MED ORDER — ONDANSETRON HCL 4 MG/2ML IJ SOLN
4.0000 mg | Freq: Four times a day (QID) | INTRAMUSCULAR | Status: DC | PRN
  Filled 2015-07-05: qty 2

## 2015-07-05 MED ORDER — SODIUM CHLORIDE 0.9 % IJ SOLN
10.0000 mL | Freq: Two times a day (BID) | INTRAMUSCULAR | Status: DC
  Administered 2015-07-05 – 2015-07-14 (×14): 10 mL

## 2015-07-05 MED ORDER — PREDNISOLONE 15 MG/5ML PO SOLN
40.0000 mg | Freq: Every day | ORAL | Status: DC
  Administered 2015-07-06 – 2015-07-15 (×10): 40 mg
  Filled 2015-07-05 (×12): qty 15

## 2015-07-05 MED ORDER — SODIUM CHLORIDE 0.9 % IJ SOLN: 10.0000 mL | INTRAMUSCULAR | Status: DC | PRN

## 2015-07-05 MED ORDER — CEFTRIAXONE SODIUM IN DEXTROSE 40 MG/ML IV SOLN
2.0000 g | INTRAVENOUS | Status: AC
  Administered 2015-07-06 – 2015-07-10 (×5): 2 g via INTRAVENOUS
  Filled 2015-07-05 (×5): qty 50

## 2015-07-05 MED ORDER — POTASSIUM CHLORIDE 20 MEQ/15ML (10%) PO SOLN
40.0000 meq | Freq: Once | ORAL | Status: AC
  Administered 2015-07-05: 40 meq
  Filled 2015-07-05: qty 30

## 2015-07-05 MED ORDER — LORAZEPAM 2 MG/ML IJ SOLN
1.0000 mg | INTRAMUSCULAR | Status: DC | PRN
  Administered 2015-07-05: 1 mg via INTRAVENOUS
  Administered 2015-07-05 (×2): 2 mg via INTRAVENOUS
  Administered 2015-07-06 (×3): 4 mg via INTRAVENOUS
  Filled 2015-07-05: qty 2
  Filled 2015-07-05 (×4): qty 1
  Filled 2015-07-05 (×2): qty 2

## 2015-07-05 MED ORDER — SODIUM CHLORIDE 0.9 % IV SOLN
INTRAVENOUS | Status: DC
  Administered 2015-07-05 (×2): via INTRAVENOUS
  Administered 2015-07-06: 75 mL/h via INTRAVENOUS

## 2015-07-05 MED ORDER — RANITIDINE HCL 150 MG/10ML PO SYRP
150.0000 mg | ORAL_SOLUTION | Freq: Two times a day (BID) | ORAL | Status: DC
  Administered 2015-07-05 – 2015-07-15 (×21): 150 mg
  Filled 2015-07-05 (×20): qty 10

## 2015-07-05 NOTE — Progress Notes (Signed)
Nutrition Follow-up  DOCUMENTATION CODES:   Non-severe (moderate) malnutrition in context of chronic illness  INTERVENTION:   If pt remains NPO/intubated for >24 hours, recommend initiation of nutrition support.  Recommendations:  Initiate Vital AF 1.2 @ 15 ml/hr via OGT and increase by 10 ml every 4 hours to goal rate of 45 ml/hr.  30 ml Prostat BID.   Tube feeding regimen provides 1496 kcal (100% of needs), 111 grams of protein, and 876 ml of H2O.   RD to continue to monitor  NUTRITION DIAGNOSIS:   Inadequate oral intake related to inability to eat as evidenced by NPO status.  Ongoing.  GOAL:   Patient will meet greater than or equal to 90% of their needs  Not meeting currently.  MONITOR:   Diet advancement, Weight trends, Labs, I & O's  REASON FOR ASSESSMENT:   Ventilator    ASSESSMENT:   69 y.o. 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 Pt noted to ingest large amounts of ETOH on a daily basis. Pt has been recently trying to cut back ETOH intake but continues to drink. In the ED, alk phos noted to be elevated at 147 with ast of 287 and alt of 98 and total bili of 18.6. Ammonia noted to be 47. Head CT was unremarkable. Pt was started on CIWA and hospitalist consulted for admission.  Pt intubated 7/27. Currently NPO.  Per pt's husband in room, pt was not eating well PTA. Pt had poor appetite d/t nausea and would not eat much for a couple of weeks. Pt's husband estimates weight loss is around 10 lb. Per weight history documentation, pt's weight has remained stable.   Patient is currently intubated on ventilator support MV: 9.2 L/min Temp (24hrs), Avg:98.5 F (36.9 C), Min:97.8 F (36.6 C), Max:99.1 F (37.3 C)  Propofol: none Nutrition-Focused physical exam completed. Findings are mild/moderate fat depletion, mild/moderate muscle depletion, and no edema.    Labs reviewed.   Diet Order:  Diet NPO time specified  Skin:   (jaundice)  Last BM:  7/28  Height:   Ht Readings from Last 1 Encounters:  07/04/15 $RemoveB'5\' 2"'fXFcqqco$  (1.575 m)    Weight:   Wt Readings from Last 1 Encounters:  07/05/15 151 lb 14.4 oz (68.9 kg)    Ideal Body Weight:  50 kg  BMI:  Body mass index is 27.78 kg/(m^2).  Estimated Nutritional Needs:   Kcal:  1411  Protein:  100-110g  Fluid:  2L/day  EDUCATION NEEDS:   No education needs identified at this time  Clayton Bibles, MS, RD, LDN Pager: [phone] After Hours Pager: [phone]

## 2015-07-05 NOTE — Consult Note (Signed)
PULMONARY / CRITICAL CARE MEDICINE   Name: Theresa [name] MRN: [id number] DOB: 1946/07/03    ADMISSION DATE:  07/03/2015 CONSULTATION DATE:  07/04/15  REFERRING MD :  Dr. 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  The patient was recently diagnosed with UTI by PCP.  Admitted per [facility] for hepatic encephalopathy, ETOH withdrawal.  The patient subsequently decompensated on the afternoon of 7/27 and required intubation by anesthesia.  PCCM consulted for evaluation.   STUDIES:  7/26  CT head >> no ICH, mass or acute infarct  SIGNIFICANT EVENTS: 7/26  Admit with altered mental status, hepatic encephalopathy.  Recent UTI  7/27  Decompensation requiring intubation, PCCM consulted   SUBJECTIVE:  Easily agitated overnight.  Decreased UOP.  Heparin gtt off.   VITAL SIGNS: Temp:  [97.3 F (36.3 C)-99.1 F (37.3 C)] 99.1 F (37.3 C) (07/28 0800) Pulse Rate:  [94-104] 97 (07/28 0743) Resp:  [15-35] 21 (07/28 0743) BP: (101-128)/(51-95) 123/58 mmHg (07/28 0743) SpO2:  [85 %-100 %] 98 % (07/28 0743) FiO2 (%):  [50 %-100 %] 50 % (07/28 0743) Weight:  [151 lb 14.4 oz (68.9 kg)] 151 lb 14.4 oz (68.9 kg) (07/28 0119) HEMODYNAMICS:   VENTILATOR SETTINGS: Vent Mode:  [-] PRVC FiO2 (%):  [50 %-100 %] 50 % Set Rate:  [16 bmp] 16 bmp Vt Set:  [400 mL] 400 mL PEEP:  [5 cmH20] 5 cmH20 Plateau Pressure:  [5 cmH20-17 cmH20] 5 cmH20 INTAKE / OUTPUT:  Intake/Output Summary (Last 24 hours) at 07/05/15 6712 Last data filed at 07/05/15 0800  Gross per 24 hour  Intake 1887.75 ml  Output   1105 ml  Net 782.75 ml    PHYSICAL EXAMINATION: General:  Chronically ill appearing female, jaundiced  Neuro:  Sedated on vent, RASS -1, easily agitated per nurse, just received PRN ativan, pupils 42mm=, R HEENT:  OETT, mm pink/moist, no jvd,  scleral icterus  Cardiovascular:  s1s2 rrr, no m/r/g Lungs:  resp's eve/non-labored, lungs bilaterally coarse  Abdomen:  Obese/soft, bsx4 hypoactive  Musculoskeletal:  No acute deformities  Skin:  Warm/dry, no edema, jaundiced  LABS:  CBC  Recent Labs Lab 07/03/15 1257 07/04/15 0400 07/05/15 0424  WBC 15.2* 13.7* 15.0*  HGB 10.6* 8.9* 9.5*  HCT 32.3* 25.6* 27.5*  PLT 231 176 181   Coag's  Recent Labs Lab 07/03/15 1257 07/03/15 1714 07/04/15 0400 07/05/15 0424  APTT  --  46* 195*  --   INR 3.36*  --  2.83* 1.63*   BMET  Recent Labs Lab 07/03/15 1257 07/04/15 0400 07/05/15 0424  NA 141 145 142  K 2.9* 3.6 3.5  CL 99* 110 109  CO2 21* 27 24  BUN 9 10 10   CREATININE 1.05* 0.71 0.46  GLUCOSE 89 55* 100*   Electrolytes  Recent Labs Lab 07/03/15 1257 07/03/15 1714 07/04/15 0400 07/05/15 0424  CALCIUM 8.9  --  7.4* 7.3*  MG  --  1.5*  --   --    Sepsis Markers  Recent Labs Lab 07/04/15 2040 07/05/15 0424 07/05/15 0440  LATICACIDVEN 1.9  --  1.6  PROCALCITON 0.61 0.51  --    ABG  Recent Labs Lab 07/04/15 1323 07/04/15 1715 07/05/15 0500  PHART 7.269* 7.357 7.404  PCO2ART 51.9* 43.1 37.7  PO2ART 63.5* 93.6 111*  Liver Enzymes  Recent Labs Lab 07/03/15 1257 07/04/15 0400 07/05/15 0424  AST 287* 211* 186*  ALT 98* 71* 67*  ALKPHOS 147* 121 132*  BILITOT 18.6* 16.3* 15.5*  ALBUMIN 2.2* 1.8* 1.8*   Cardiac Enzymes No results for input(s): TROPONINI, PROBNP in the last 168 hours. Glucose  Recent Labs Lab 07/04/15 0811 07/04/15 0905 07/04/15 1759 07/04/15 2347 07/05/15 0603  GLUCAP 50* 217* 93 110* 99    Imaging US Abdomen Complete  07/04/2015   CLINICAL DATA:  Elevated liver function tests. Jaundice. Alcohol abuse. Prior cholecystectomy.  EXAM: ULTRASOUND ABDOMEN COMPLETE  COMPARISON:  11/09/2014  FINDINGS: Technically difficult exam due to large patient habitus.  Gallbladder: Surgically absent.  Common bile duct: Diameter:  8 mm, within normal limits status post cholecystectomy.  Liver: Diffusely increased echogenicity of the hepatic parenchyma, consistent with diffuse hepatic steatosis. No focal mass lesion identified. No evidence of ascites.  IVC: Not visualized secondary to hepatic steatosis.  Pancreas: Not well visualized due to large habitus and overlying bowel gas.  Spleen: Size and appearance within normal limits.  Right Kidney: Length: 10.4 cm. Echogenicity within normal limits. No mass or hydronephrosis visualized.  Left Kidney: Length: 9.5 cm. Echogenicity within normal limits. No mass or hydronephrosis visualized.  Abdominal aorta: No aneurysm visualized.  Other findings: None.  IMPRESSION: Prior cholecystectomy. No evidence of biliary ductal dilatation or other acute findings.  Severe hepatic steatosis.  No evidence of hydronephrosis.   Electronically Signed   By: Earle Gell M.D.   On: 07/04/2015 21:56   Dg Chest Port 1 View  07/05/2015   CLINICAL DATA:  Check endotracheal tube position, respiratory failure  EXAM: PORTABLE CHEST - 1 VIEW  COMPARISON:  07/04/2015  FINDINGS: The endotracheal tube is again noted 2.4 cm above the carina. A nasogastric catheter is now seen within the stomach. The cardiac shadow is stable. Bibasilar atelectatic changes slightly worse on the left than the right are noted. No acute bony abnormality is seen.  IMPRESSION: Bibasilar atelectasis slightly worse on the left.  Tubes and lines as described.   Electronically Signed   By: Inez Catalina M.D.   On: 07/05/2015 07:34   Dg Chest Port 1 View  07/04/2015   CLINICAL DATA:  Hypoxia  EXAM: PORTABLE CHEST - 1 VIEW  COMPARISON:  May 12, 2015  FINDINGS: Endotracheal tube tip is 4.5 cm above the carina. There is atelectatic change in the lung bases. Lungs elsewhere clear. Heart size and pulmonary vascularity are normal. No adenopathy. There is atherosclerotic change in the aorta.  IMPRESSION: Bibasilar atelectasis. Endotracheal tube as described  without pneumothorax.   Electronically Signed   By: Lowella Grip III M.D.   On: 07/04/2015 16:19     ASSESSMENT / PLAN:  PULMONARY OETT 7/27 >> A: Acute Hypercarbic Respiratory Failure - in the setting of hepatic encephalopathy  Hx PE/DVT (2015)- on Xarelto  P:   MV Support, 8 cc/kg Wean PEEP / FiO2 for saturations > 92% F/u CXR  Daily SBT when mental status allows  PRN albuterol  VAP prevention measures  Anticoagulation as below   CARDIOVASCULAR CVL A:  Tachycardia  Hx HLD, SVT, PAD P:  Cont off heparin gtt for now  For PICC placement  Cont prednisone    RENAL A:   Hypokalemia Hypomagnesemia  UOP dwindling  P:   Trend BMP  Mg replaced  NS @75ml /hr    GASTROINTESTINAL A:   ETOH  Abdominal Pain, Nausea / Vomiting - seen  by GI Severe Protein Calorie Malnutrition  Hx Esophageal Stricture s/p Dilation (06/06/15)  P:   GI following  NPO Consider TF in am 7/28 Pepcid BID for stress ulcer prophylaxis  Thiamine, folate, MVI    HEMATOLOGIC / ONCOLOGY  A:   Coagulopathy  Anemia  Chronic Anticoagulation for PE/DVT Hx Infiltrating Ductal Carcinoma (2002) P:  Trend CBC F/u ptt now  Cont hold heparin gtt for now - PE 1 year ago, ?duration of anticoagulation planned.  Auto-anticoagulated at this point with liver dz D/c Vit K  Assess [place] abd, hepatitis panel  F/u coags daily SCD's  GI following  Rifaximin  Lactulose    INFECTIOUS A:   UTI  P:   UC 7/27 >>   Rocephin, start date 7/27, day 2/x Trend fever curve / WBC   ENDOCRINE A:   Hypoglycemia  -improved  P:   NS at 74ml/hr CBG Q4    NEUROLOGIC A:   Hepatic Encephalopathy  ETOH Abuse / Withdrawal  P:   RASS goal: 0 Lactulose PT Xifaxan PT  Thiamine / folate / MVI PRN Ativan Allow to wake as much as possible and assess for weaning    FAMILY  - Updates: No family available 7/28  - Inter-disciplinary family meet or Palliative Care meeting due by: 8/1   Nickolas Madrid,  NP 07/05/2015  8:28 AM Pager: [phone] or [phone]

## 2015-07-05 NOTE — Progress Notes (Addendum)
[facility] Progress Note  Subjective:   Pt initially seen yesterday with alcoholic hepatitis, encephalopathy, ETOH intoxication/withdrawal.  Discriminant function was 66, and it was felt there may be a benefit from corticosteroids.  Pt decompensated yesterday afternnon and was intubated. Currently on xifaxan and lactulose for encephalopathy--ammonia was 39 on 7/27. On thiamine/folate/ prn ativan for ETOH withdrawal. INR 1.63 today--received Vit K 10 mg yesterday. T bili 15.5, AST 186, ALT 67, alk phos 132.   Objective:  Vital signs in last 24 hours: Temp:  [97.3 F (36.3 C)-99.1 F (37.3 C)] 99.1 F (37.3 C) (07/28 0800) Pulse Rate:  [94-104] 103 (07/28 0900) Resp:  [15-35] 20 (07/28 0900) BP: (101-128)/(51-95) 116/76 mmHg (07/28 0900) SpO2:  [85 %-100 %] 99 % (07/28 0900) FiO2 (%):  [50 %-100 %] 50 % (07/28 0900) Weight:  [151 lb 14.4 oz (68.9 kg)] 151 lb 14.4 oz (68.9 kg) (07/28 0119) Last BM Date: 07/05/15 General:   Jaundiced, ill appearing, intubated Heart:  Regular rate and rhythm; no murmurs Pulm: clear Abdomen:   Soft,nontender, BS active,nonpalp mass or hsm.   Extremities:  Without edema. Neurologic:sedated, on vent    Intake/Output from previous day: 07/27 0701 - 07/28 0700 In: 1887.8 [I.V.:1737.8; NG/GT:100; IV Piggyback:50] Out: 1095 [Urine:595; Emesis/NG output:500] Intake/Output this shift: Total I/O In: 256.3 [I.V.:86.3; NG/GT:120; IV Piggyback:50] Out: 10 [Urine:10]  Lab Results:  Recent Labs  07/03/15 1257 07/04/15 0400 07/05/15 0424  WBC 15.2* 13.7* 15.0*  HGB 10.6* 8.9* 9.5*  HCT 32.3* 25.6* 27.5*  PLT 231 176 181   BMET  Recent Labs  07/03/15 1257 07/04/15 0400 07/05/15 0424  NA 141 145 142  K 2.9* 3.6 3.5  CL 99* 110 109  CO2 21* 27 24  GLUCOSE 89 55* 100*  BUN _0 CREATININE 1.05* 0.71 0.46  CALCIUM 8.9 7.4* 7.3*   LFT  Recent Labs  07/05/15 0424  PROT 5.4*  ALBUMIN 1.8*  AST 186*  ALT 67*    ALKPHOS 132*  BILITOT 15.5*   PT/INR  Recent Labs  07/04/15 0400 07/05/15 0424  LABPROT 29.3* 19.4*  INR 2.83* 1.63*      Ct Head Wo Contrast  07/03/2015   CLINICAL DATA:  Altered mental status/disorientation  EXAM: CT HEAD WITHOUT CONTRAST  TECHNIQUE: Contiguous axial images were obtained from the base of the skull through the vertex without intravenous contrast.  COMPARISON:  May 12, 2015  FINDINGS: There is age related volume loss. There is no intracranial mass, hemorrhage, extra-axial fluid collection, or midline shift. There is relatively mild patchy small vessel disease in the centra semiovale bilaterally. There is no new gray-white compartment lesion. No acute infarct evident. Bony calvarium appears intact. Mastoid air cells are clear.  IMPRESSION: Age related volume loss with patchy periventricular small vessel disease. No intracranial mass, hemorrhage, or acute appearing infarct.   Electronically Signed   By: Lowella Grip III M.D.   On: 07/03/2015 13:54   US Abdomen Complete  07/04/2015   CLINICAL DATA:  Elevated liver function tests. Jaundice. Alcohol abuse. Prior cholecystectomy.  EXAM: ULTRASOUND ABDOMEN COMPLETE  COMPARISON:  11/09/2014  FINDINGS: Technically difficult exam due to large patient habitus.  Gallbladder: Surgically absent.  Common bile duct: Diameter: 8 mm, within normal limits status post cholecystectomy.  Liver: Diffusely increased echogenicity of the hepatic parenchyma, consistent with diffuse hepatic steatosis. No focal mass lesion identified. No evidence of ascites.  IVC: Not visualized secondary to hepatic steatosis.  Pancreas:  Not well visualized due to large habitus and overlying bowel gas.  Spleen: Size and appearance within normal limits.  Right Kidney: Length: 10.4 cm. Echogenicity within normal limits. No mass or hydronephrosis visualized.  Left Kidney: Length: 9.5 cm. Echogenicity within normal limits. No mass or hydronephrosis visualized.  Abdominal  aorta: No aneurysm visualized.  Other findings: None.  IMPRESSION: Prior cholecystectomy. No evidence of biliary ductal dilatation or other acute findings.  Severe hepatic steatosis.  No evidence of hydronephrosis.   Electronically Signed   By: Earle Gell M.D.   On: 07/04/2015 21:56   Dg Chest Port 1 View  07/05/2015   CLINICAL DATA:  Check endotracheal tube position, respiratory failure  EXAM: PORTABLE CHEST - 1 VIEW  COMPARISON:  07/04/2015  FINDINGS: The endotracheal tube is again noted 2.4 cm above the carina. A nasogastric catheter is now seen within the stomach. The cardiac shadow is stable. Bibasilar atelectatic changes slightly worse on the left than the right are noted. No acute bony abnormality is seen.  IMPRESSION: Bibasilar atelectasis slightly worse on the left.  Tubes and lines as described.   Electronically Signed   By: Inez Catalina M.D.   On: 07/05/2015 07:34   Dg Chest Port 1 View  07/04/2015   CLINICAL DATA:  Hypoxia  EXAM: PORTABLE CHEST - 1 VIEW  COMPARISON:  May 12, 2015  FINDINGS: Endotracheal tube tip is 4.5 cm above the carina. There is atelectatic change in the lung bases. Lungs elsewhere clear. Heart size and pulmonary vascularity are normal. No adenopathy. There is atherosclerotic change in the aorta.  IMPRESSION: Bibasilar atelectasis. Endotracheal tube as described without pneumothorax.   Electronically Signed   By: Lowella Grip III M.D.   On: 07/04/2015 16:19    ASSESSMENT/PLAN:  Alcoholic hepatitis with encephalopathy/coagulaopathy. PT/INR 19.4/1.63. Prednisolone 40 mg daily started yesterday. On rocephin for SBP prophyllaxis. Cont to monitor PT/INR, LFTs. Continue lactulose/xifaxan, recheck ammonia.  Continue thiamine/folate/prn ativan for withdrawal.  Acute resp failure  Esophageal stricture--pt had EGD 6/29 and was to schedule repeat EGD with dilation as outpatient--can be followed as outpatient, as can thickening of cecum/ascending colon wall noted on CT.    LOS: 2 days   Theresa [name], Theresa [name] 07/05/2015, Pager [phone]     Attending physician's note   I have taken an interval history, reviewed the chart and examined the patient. I agree with the Advanced Practitioner's note, impression and recommendations. Alcoholic hepatitis, alcohol withdrawal, altered mental status. Remains obtunded and intubated on vent. Ammonia only 39 yesterday and repeat is pending. PT/INR substantially improved with giving Vit K/holding Xarelto so the majority of the PT/INR elevation yesterday was not due to liver dysfunction. Discriminant function today has decreased to 35 (above 32 is indication for corticosteroid use) so will continue corticosteroids for 28 day course. Had conversation with husband at bedside that alcoholic hepatitis can be fatal and if she recovers it could take weeks. Emphasized need to completely discontinue alcohol use.   Pricilla Riffle. Fuller Plan, MD [facility] Regal [phone] pager Mon-Fri 8a-5p [phone] weekends, holidays and 5p-8a or per [facility]

## 2015-07-05 NOTE — Progress Notes (Signed)
Peripherally Inserted Central Catheter/Midline Placement  The IV Nurse has discussed with the patient and/or persons authorized to consent for the patient, the purpose of this procedure and the potential benefits and risks involved with this procedure.  The benefits include less needle sticks, lab draws from the catheter and patient may be discharged home with the catheter.  Risks include, but not limited to, infection, bleeding, blood clot (thrombus formation), and puncture of an artery; nerve damage and irregular heat beat.  Alternatives to this procedure were also discussed.  PICC/Midline Placement Documentation        Henderson [place] 07/05/2015, 11:12 AM Phone consent obtained from husband.

## 2015-07-05 NOTE — Care Management Important Message (Signed)
Important Message  Patient Details  Name: Theresa [name] MRN: [id number] Date of Birth: 1946/11/06   Medicare Important Message Given:  [facility] notification given    Camillo Flaming 07/05/2015, 1:05 [place] Message  Patient Details  Name: Theresa [name] MRN: [id number] Date of Birth: [birth year]   Medicare Important Message Given:  Yes-second notification given    Camillo Flaming 07/05/2015, 1:05 PM

## 2015-07-06 ENCOUNTER — Ambulatory Visit: Payer: Medicare Other | Admitting: Gastroenterology

## 2015-07-06 ENCOUNTER — Inpatient Hospital Stay (HOSPITAL_COMMUNITY): Payer: Medicare Other

## 2015-07-06 DIAGNOSIS — F10231 Alcohol dependence with withdrawal delirium: Secondary | ICD-10-CM

## 2015-07-06 LAB — RENAL FUNCTION PANEL
ALBUMIN: 1.6 g/dL — AB (ref 3.5–5.0)
Anion gap: 5 (ref 5–15)
BUN: 11 mg/dL (ref 6–20)
CHLORIDE: 115 mmol/L — AB (ref 101–111)
CO2: 23 mmol/L (ref 22–32)
Calcium: 7.2 mg/dL — ABNORMAL LOW (ref 8.9–10.3)
Creatinine, Ser: 0.61 mg/dL (ref 0.44–1.00)
GFR calc non Af Amer: >60 mL/min (ref >60)
Glucose, Bld: 100 mg/dL — ABNORMAL HIGH (ref 65–99)
PHOSPHORUS: 1.5 mg/dL — AB (ref 2.5–4.6)
POTASSIUM: 4 mmol/L (ref 3.5–5.1)
Sodium: 143 mmol/L (ref 135–145)

## 2015-07-06 LAB — GLUCOSE, CAPILLARY
GLUCOSE-CAPILLARY: 93 mg/dL (ref 65–99)
Glucose-Capillary: 91 mg/dL (ref 65–99)
Glucose-Capillary: 95 mg/dL (ref 65–99)
Glucose-Capillary: 87 mg/dL (ref 65–99)
Glucose-Capillary: 91 mg/dL (ref 65–99)

## 2015-07-06 LAB — HEPATIC FUNCTION PANEL
ALK PHOS: 129 U/L — AB (ref 38–126)
ALT: 72 U/L — AB (ref 14–54)
AST: 171 U/L — AB (ref 15–41)
Albumin: 1.8 g/dL — ABNORMAL LOW (ref 3.5–5.0)
Bilirubin, Direct: 9.4 mg/dL — ABNORMAL HIGH (ref 0.1–0.5)
Indirect Bilirubin: 8.1 mg/dL — ABNORMAL HIGH (ref 0.3–0.9)
Total Bilirubin: 17.5 mg/dL — ABNORMAL HIGH (ref 0.3–1.2)
Total Protein: 5.7 g/dL — ABNORMAL LOW (ref 6.5–8.1)

## 2015-07-06 LAB — CBC
HCT: 25.6 % — ABNORMAL LOW (ref 36.0–46.0)
Hemoglobin: 9 g/dL — ABNORMAL LOW (ref 12.0–15.0)
MCH: 44.6 pg — ABNORMAL HIGH (ref 26.0–34.0)
MCHC: 35.2 g/dL (ref 30.0–36.0)
MCV: 126.7 fL — AB (ref 78.0–100.0)
Platelets: 173 10*3/uL (ref 150–400)
RBC: 2.02 MIL/uL — ABNORMAL LOW (ref 3.87–5.11)
RDW: 20.5 % — ABNORMAL HIGH (ref 11.5–15.5)
WBC: 12.9 10*3/uL — ABNORMAL HIGH (ref 4.0–10.5)

## 2015-07-06 LAB — HEPATITIS PANEL, ACUTE
HCV Ab: <0.1 s/co ratio (ref 0.0–0.9)
HEP B C IGM: NEGATIVE
Hep A IgM: NEGATIVE
Hepatitis B Surface Ag: NEGATIVE

## 2015-07-06 LAB — PROTIME-INR
INR: 1.67 — ABNORMAL HIGH (ref 0.00–1.49)
Prothrombin Time: 19.7 seconds — ABNORMAL HIGH (ref 11.6–15.2)

## 2015-07-06 LAB — PROCALCITONIN: PROCALCITONIN: 0.34 ng/mL

## 2015-07-06 LAB — AMMONIA: Ammonia: 47 umol/L — ABNORMAL HIGH (ref 9–35)

## 2015-07-06 LAB — APTT: APTT: 39 s — AB (ref 24–37)

## 2015-07-06 LAB — FIBRINOGEN: Fibrinogen: 181 mg/dL — ABNORMAL LOW (ref 204–475)

## 2015-07-06 LAB — MAGNESIUM: MAGNESIUM: 2 mg/dL (ref 1.7–2.4)

## 2015-07-06 MED ORDER — SODIUM PHOSPHATE 3 MMOLE/ML IV SOLN
30.0000 mmol | Freq: Once | INTRAVENOUS | Status: AC
  Administered 2015-07-06: 30 mmol via INTRAVENOUS
  Filled 2015-07-06: qty 10

## 2015-07-06 MED ORDER — ADULT MULTIVITAMIN LIQUID CH
5.0000 mL | Freq: Every day | ORAL | Status: DC
  Administered 2015-07-06 – 2015-07-15 (×10): 5 mL
  Filled 2015-07-06 (×19): qty 5

## 2015-07-06 NOTE — Progress Notes (Addendum)
Discussed with pharmacist about grouping medication administrations via OG tube together when possible.  Even after OG tube is clamp for 1-1.5 hours, medications are still evident in OG drainage when intermittent suction is restarted.  One bm in the early am though lactulose was administered as scheduled.

## 2015-07-06 NOTE — Progress Notes (Addendum)
[facility] Progress Note  Subjective:     PT/INR 19.7/1.67.Tbili 17.5, alk phos 129, AST 171, ALT 72 .Remians sedated, intubated. Has moved extremities, but has not been noted to have purposeful movement. BUN/creat stable, but has had decreased uine output. Ammonia 47. Currently on xifaxan and lactulose for encephalopathy--ammonia was 39 on 7/27. On rocephin for prevention SBP.   Objective:  Vital signs in last 24 hours: Temp:  [97.9 F (36.6 C)-98.9 F (37.2 C)] 97.9 F (36.6 C) (07/29 0800) Pulse Rate:  [92-106] 102 (07/29 0900) Resp:  [14-24] 24 (07/29 0900) BP: (102-159)/(66-104) 132/84 mmHg (07/29 0900) SpO2:  [94 %-98 %] 97 % (07/29 0900) FiO2 (%):  [40 %-50 %] 40 % (07/29 0900) Last BM Date: 07/06/15 General:   Sedated, jaundiced Heart:  Regular rate and rhythm; no murmurs Pulm;decreased breath sounds Abdomen:  Soft, nontender and nondistended. Normal bowel sounds, without guarding, and without rebound.     Intake/Output from previous day: 07/28 0701 - 07/29 0700 In: 2276.3 [I.V.:1661.3; NG/GT:280; IV Piggyback:300] Out: 593 [Urine:193; Emesis/NG output:400] Intake/Output this shift: Total I/O In: -  Out: 200 [Emesis/NG output:200]  Lab Results:  Recent Labs  07/04/15 0400 07/05/15 0424 07/06/15 0400  WBC 13.7* 15.0* 12.9*  HGB 8.9* 9.5* 9.0*  HCT 25.6* 27.5* 25.6*  PLT 176 181 173   BMET  Recent Labs  07/04/15 0400 07/05/15 0424 07/06/15 0400  NA 145 142 143  K 3.6 3.5 4.0  CL 110 109 115*  CO2 _0 GLUCOSE 55* 100* 100*  BUN _1 CREATININE 0.71 0.46 0.61  CALCIUM 7.4* 7.3* 7.2*   LFT  Recent Labs  07/06/15 0523  PROT 5.7*  ALBUMIN 1.8*  AST 171*  ALT 72*  ALKPHOS 129*  BILITOT 17.5*  BILIDIR 9.4*  IBILI 8.1*   PT/INR  Recent Labs  07/05/15 0424 07/06/15 0400  LABPROT 19.4* 19.7*  INR 1.63* 1.67*   Hepatitis Panel  Recent Labs  07/04/15 2040  HEPBSAG Negative  HCVAB <0.1  HEPAIGM  Negative  HEPBIGM Negative    US Abdomen Complete  07/04/2015   CLINICAL DATA:  Elevated liver function tests. Jaundice. Alcohol abuse. Prior cholecystectomy.  EXAM: ULTRASOUND ABDOMEN COMPLETE  COMPARISON:  11/09/2014  FINDINGS: Technically difficult exam due to large patient habitus.  Gallbladder: Surgically absent.  Common bile duct: Diameter: 8 mm, within normal limits status post cholecystectomy.  Liver: Diffusely increased echogenicity of the hepatic parenchyma, consistent with diffuse hepatic steatosis. No focal mass lesion identified. No evidence of ascites.  IVC: Not visualized secondary to hepatic steatosis.  Pancreas: Not well visualized due to large habitus and overlying bowel gas.  Spleen: Size and appearance within normal limits.  Right Kidney: Length: 10.4 cm. Echogenicity within normal limits. No mass or hydronephrosis visualized.  Left Kidney: Length: 9.5 cm. Echogenicity within normal limits. No mass or hydronephrosis visualized.  Abdominal aorta: No aneurysm visualized.  Other findings: None.  IMPRESSION: Prior cholecystectomy. No evidence of biliary ductal dilatation or other acute findings.  Severe hepatic steatosis.  No evidence of hydronephrosis.   Electronically Signed   By: Earle Gell M.D.   On: 07/04/2015 21:56   Dg Chest Port 1 View  07/06/2015   CLINICAL DATA:  Respiratory failure.  EXAM: PORTABLE CHEST - 1 VIEW  COMPARISON:  July 05, 2015.  FINDINGS: Stable cardiomediastinal silhouette. Endotracheal tube is in grossly good position with distal tip approximately 3 cm above the carina. Nasogastric tube is  seen entering the stomach. Left-sided PICC line is noted with distal tip overlying the expected position of the SVC. No pneumothorax is noted. Mild right basilar subsegmental atelectasis is noted. Stable left basilar opacity is noted concerning for pneumonia or atelectasis with associated pleural effusion. Bony thorax is intact.  IMPRESSION: Endotracheal and nasogastric tubes are  in grossly good position. Mild right basilar subsegmental atelectasis noted. Stable larger left basilar opacity is noted concerning for pneumonia or atelectasis with associated effusion.   Electronically Signed   By: James  Green Jr, M.D.   On: 07/06/2015 07:24   Dg Chest Port 1 View  07/05/2015   CLINICAL DATA:  Check endotracheal tube position, respiratory failure  EXAM: PORTABLE CHEST - 1 VIEW  COMPARISON:  07/04/2015  FINDINGS: The endotracheal tube is again noted 2.4 cm above the carina. A nasogastric catheter is now seen within the stomach. The cardiac shadow is stable. Bibasilar atelectatic changes slightly worse on the left than the right are noted. No acute bony abnormality is seen.  IMPRESSION: Bibasilar atelectasis slightly worse on the left.  Tubes and lines as described.   Electronically Signed   By: Mark  Lukens M.D.   On: 07/05/2015 07:34   Dg Chest Port 1 View  07/04/2015   CLINICAL DATA:  Hypoxia  EXAM: PORTABLE CHEST - 1 VIEW  COMPARISON:  May 12, 2015  FINDINGS: Endotracheal tube tip is 4.5 cm above the carina. There is atelectatic change in the lung bases. Lungs elsewhere clear. Heart size and pulmonary vascularity are normal. No adenopathy. There is atherosclerotic change in the aorta.  IMPRESSION: Bibasilar atelectasis. Endotracheal tube as described without pneumothorax.   Electronically Signed   By: William  Woodruff III M.D.   On: 07/04/2015 16:19    ASSESSMENT/PLAN:  68 yo female with alcoholic hepatitis, alcohol withdrawal, encephalopathy. Sedated, intubated. PT/INR without much change. T bili with increase to 17.5. Discrimant function today 38, so would continue corticosteroids.    LOS: 3 days   Hvozdovic, Lori P PA-C 07/06/2015, Pager [phone]     Attending physician's note   I have taken an interval history, reviewed the chart and examined the patient. I agree with the Advanced Practitioner's note, impression and recommendations. Alcoholic hepatitis, alcohol  withdrawal, altered mental status. Remains sedated and intubated on vent. LFTs and t. bili stable. PT/INR stable. Ammonia 47. Discriminant function 38 today and since it is consistently over 32 would continue corticosteroids for 28 day course. Continue current mgmt. We will plan to see again on Monday. Please call for questions problems over weekend.    T. , MD FACG [phone] pager Mon-Fri 8a-5p [phone] weekends, holidays and 5p-8a or per Amion 

## 2015-07-06 NOTE — Progress Notes (Signed)
[facility] Progress Note Patient Name: Theresa [name] DOB: [birth year] MRN: [id number]   Date of Service  07/06/2015  HPI/Events of Note  hypophosphatemia  eICU Interventions  Phos replaced     Intervention Category Intermediate Interventions: Electrolyte abnormality - evaluation and management  Camdynn Maranto 07/06/2015, 5:26 AM

## 2015-07-06 NOTE — Care Management Note (Signed)
Case Management Note  Patient Details  Name: Theresa [name] MRN: [id number] Date of Birth: 23-May-1946  Subjective/Objective:           etoh abuse/intubated on [id number]         Action/Plan: Date:  July 06, 2015 U.R. performed for needs and level of care. Will continue to follow for Case Management needs.  Velva Harman, RN, BSN, [place]   [phone]  Expected Discharge Date:   (unknown)               Expected Discharge Plan:  Home/Self Care  In-House Referral:  Clinical Social Work  Discharge planning Services  CM Consult  Post Acute Care Choice:  NA Choice offered to:  NA  DME Arranged:  N/A DME Agency:  NA  HH Arranged:  NA HH Agency:  NA  Status of Service:  In process, will continue to follow  Medicare Important Message Given:  Yes-second notification given Date Medicare IM Given:    Medicare IM give by:    Date Additional Medicare IM Given:    Additional Medicare Important Message give by:     If discussed at [place] of Stay Meetings, dates discussed:    Additional Comments:  Leeroy Cha, RN 07/06/2015, 10:08 AM

## 2015-07-06 NOTE — Care Management Note (Deleted)
Case Management Note  Patient Details  Name: Theresa [name] MRN: [id number] Date of Birth: [birth year]  Subjective/Objective:                 etoh abuse and resp failure   Action/Plan: Date:  July 06, 2015 U.R. performed for needs and level of care. Will continue to follow for Case Management needs.  Velva Harman, RN, BSN, [place]   [phone]  Expected Discharge Date:   (unknown)               Expected Discharge Plan:  Home/Self Care  In-House Referral:  Clinical Social Work  Discharge planning Services  CM Consult  Post Acute Care Choice:  NA Choice offered to:  NA  DME Arranged:  N/A DME Agency:  NA  HH Arranged:  NA HH Agency:  NA  Status of Service:  In process, will continue to follow  Medicare Important Message Given:  Yes-second notification given Date Medicare IM Given:    Medicare IM give by:    Date Additional Medicare IM Given:    Additional Medicare Important Message give by:     If discussed at [place] of Stay Meetings, dates discussed:    Additional Comments:  Leeroy Cha, RN 07/06/2015, 10:11 AM

## 2015-07-06 NOTE — Progress Notes (Signed)
PULMONARY / CRITICAL CARE MEDICINE   Name: Theresa [name] MRN: [id number] DOB: 07-Dec-1946    ADMISSION DATE:  07/03/2015 CONSULTATION DATE:  07/04/15  REFERRING MD :  Dr. 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  The patient was recently diagnosed with UTI by PCP.  Admitted per [facility] for hepatic encephalopathy, ETOH withdrawal.  The patient subsequently decompensated on the afternoon of 7/27 and required intubation by anesthesia.  PCCM consulted for evaluation.   STUDIES:  7/26  CT head >> no ICH, mass or acute infarct  SIGNIFICANT EVENTS: 7/26  Admit with altered mental status, hepatic encephalopathy.  Recent UTI  7/27  Decompensation requiring intubation, PCCM consulted   SUBJECTIVE:   Patient had no acute events overnight. She did receive Ativan IV overnight. Family reports she has not had purposeful movement but has been spontaneously moving her extremities. Urine output continues to remain marginal.  ROS:  Unable to obtain as the patient is sedated and intubated.  VITAL SIGNS: Temp:  [97.9 F (36.6 C)-98.9 F (37.2 C)] 97.9 F (36.6 C) (07/29 0800) Pulse Rate:  [92-106] 102 (07/29 0900) Resp:  [14-24] 24 (07/29 0900) BP: (102-159)/(66-104) 132/84 mmHg (07/29 0900) SpO2:  [94 %-98 %] 97 % (07/29 0900) FiO2 (%):  [40 %-50 %] 40 % (07/29 0900) HEMODYNAMICS:   VENTILATOR SETTINGS: Vent Mode:  [-] PSV;CPAP FiO2 (%):  [40 %-50 %] 40 % Set Rate:  [16 bmp] 16 bmp Vt Set:  [400 mL] 400 mL PEEP:  [5 cmH20] 5 cmH20 Pressure Support:  [5 cmH20] 5 cmH20 Plateau Pressure:  [10 cmH20-18 cmH20] 10 cmH20 INTAKE / OUTPUT:  Intake/Output Summary (Last 24 hours) at 07/06/15 1005 Last data filed at 07/06/15 0800  Gross per 24 hour  Intake   2020 ml  Output    783 ml  Net   1237 ml    PHYSICAL  EXAMINATION: General:  Sedated. No acute distress. Husband and daughter-in-law at bedside.  Integument:  Warm & dry. Jaundice. No rash on exposed skin. Bruise noted on left hand dorsum & right forearm. HEENT:  Moist mucus membranes. Scleral icterus noted. Endotracheal tube in place. PERRL. Cardiovascular:  Regular rate. No edema. No appreciable JVD.  Pulmonary:  Slightly decreased aeration bilateral lung bases. Symmetric chest wall rise on ventilator.  Abdomen: Soft. Normal bowel sounds. Nondistended.  Neurological:  Patient spontaneously moving all 4 extremities. Does not follow commands. Remains sedated.  LABS:  CBC  Recent Labs Lab 07/04/15 0400 07/05/15 0424 07/06/15 0400  WBC 13.7* 15.0* 12.9*  HGB 8.9* 9.5* 9.0*  HCT 25.6* 27.5* 25.6*  PLT 176 181 173   Coag's  Recent Labs Lab 07/04/15 0400 07/05/15 0424 07/05/15 1100 07/06/15 0400  APTT 195*  --  37 39*  INR 2.83* 1.63*  --  1.67*   BMET  Recent Labs Lab 07/04/15 0400 07/05/15 0424 07/06/15 0400  NA 145 142 143  K 3.6 3.5 4.0  CL 110 109 115*  CO2 27 24 23   BUN 10 10 11   CREATININE 0.71 0.46 0.61  GLUCOSE 55* 100* 100*   Electrolytes  Recent Labs Lab 07/03/15 1714 07/04/15 0400 07/05/15 0424 07/06/15 0400  CALCIUM  --  7.4* 7.3* 7.2*  MG 1.5*  --   --  2.0  PHOS  --   --   --  1.5*   Sepsis Markers  Recent Labs Lab 07/04/15 2040 07/05/15 0424 07/05/15 0440 07/06/15 0400  LATICACIDVEN 1.9  --  1.6  --   PROCALCITON 0.61 0.51  --  0.34   ABG  Recent Labs Lab 07/04/15 1323 07/04/15 1715 07/05/15 0500  PHART 7.269* 7.357 7.404  PCO2ART 51.9* 43.1 37.7  PO2ART 63.5* 93.6 111*   Liver Enzymes  Recent Labs Lab 07/04/15 0400 07/05/15 0424 07/06/15 0400 07/06/15 0523  AST 211* 186*  --  171*  ALT 71* 67*  --  72*  ALKPHOS 121 132*  --  129*  BILITOT 16.3* 15.5*  --  17.5*  ALBUMIN 1.8* 1.8* 1.6* 1.8*   Cardiac Enzymes No results for input(s): TROPONINI, PROBNP in the last  168 hours. Glucose  Recent Labs Lab 07/05/15 1130 07/05/15 1547 07/05/15 2017 07/05/15 2326 07/06/15 0508 07/06/15 0751  GLUCAP 109* 112* 105* 94 91 95    Imaging Dg Chest Port 1 View  07/06/2015   CLINICAL DATA:  Respiratory failure.  EXAM: PORTABLE CHEST - 1 VIEW  COMPARISON:  July 05, 2015.  FINDINGS: Stable cardiomediastinal silhouette. Endotracheal tube is in grossly good position with distal tip approximately 3 cm above the carina. Nasogastric tube is seen entering the stomach. Left-sided PICC line is noted with distal tip overlying the expected position of the SVC. No pneumothorax is noted. Mild right basilar subsegmental atelectasis is noted. Stable left basilar opacity is noted concerning for pneumonia or atelectasis with associated pleural effusion. Bony thorax is intact.  IMPRESSION: Endotracheal and nasogastric tubes are in grossly good position. Mild right basilar subsegmental atelectasis noted. Stable larger left basilar opacity is noted concerning for pneumonia or atelectasis with associated effusion.   Electronically Signed   By: Marijo Conception, M.D.   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   P:   MV Support, 8 cc/kg Wean PEEP / FiO2 for saturations > 92% Daily SBT when mental status allows  PRN albuterol  VAP prevention measures  Holding systemic anticoagulation  CARDIOVASCULAR A:  Tachycardia  Hx HLD, SVT, PAD  P:  Continue to monitor on telemetry  RENAL A:   Hypophosphatemia-replacing IV Hypokalemia-resolved Hypomagnesemia -resolved UOP - decreasing but positive fluid balance & question early hepatorenal syndrome  P:   Trend BUN/creatinine daily Continue to monitor renal output NS @75ml /hr   GASTROINTESTINAL A:   ETOH  Abdominal Pain, Nausea / Vomiting - seen by GI Severe Protein  Calorie Malnutrition  Hx Esophageal Stricture s/p Dilation (2/77/82)  Alcoholic hepatitis  P:   GI following  NPO  Consider TF if unable to extubate 24-48 hours Pepcid BID for stress ulcer prophylaxis  Thiamine, folate, MVI  Lactulose & rifaximin Prednisone  For alcoholic hepatitis day #4/23  HEMATOLOGIC / ONCOLOGY  A:   Coagulopathy  Anemia  Chronic Anticoagulation for PE/DVT - one year ago Hx Infiltrating Ductal Carcinoma (2002)  P:  Trend hemoglobin with daily CBC Trending daily PT, PTT, & fibrinogen Holding systemic anticoagulation - PE 1 year ago  SCD's & heparin subcutaneous every 8 hours for DVT prophylaxis   INFECTIOUS A:   Possible UTI  Possible Bilateral LL Aspiration Pna  P:   Monitoring for fever Continuing to monitor daily leukocyte count with CBC  UC 7/27 >> negative  Rocephin, start date 7/27, day 3/x  ENDOCRINE A:   Hypoglycemia - resolved.  P:   NS at 49ml/hr CBG Q4    NEUROLOGIC A:   Hepatic Encephalopathy  ETOH Abuse / Withdrawal   P:   RASS goal: 0 Lactulose  Xifaxan  Thiamine / folate / MVI IV PRN Ativan Continuing daily sedation vacation    FAMILY  - Updates: Husband and daughter-in-law at bedside today. Discussed her current clinical state with hepatic encephalopathy and alcohol withdrawal causing her altered mentation and need for endotracheal intubation. Updated them on her prognosis should she continue to consume alcohol which is dismal. They are appropriately concerned and involved. They're awaiting rounding by GI to further assess them specific organ questions.  - Inter-disciplinary family meet or Palliative Care meeting due by: 8/1  I have spent a total of 45 minutes of critical care time today caring for this patient, updating the patient's family at bedside regarding her clinical state, & reviewing the patient's electronic medical record.  Sonia Baller Ashok Cordia, M.D. [facility] Pager:   [phone] After 3pm or if no response, call [phone]   07/06/2015  10:05 AM

## 2015-07-07 DIAGNOSIS — L8990 Pressure ulcer of unspecified site, unspecified stage: Secondary | ICD-10-CM | POA: Insufficient documentation

## 2015-07-07 LAB — FIBRINOGEN: FIBRINOGEN: 181 mg/dL — AB (ref 204–475)

## 2015-07-07 LAB — CBC
HEMATOCRIT: 28.1 % — AB (ref 36.0–46.0)
HEMOGLOBIN: 9.4 g/dL — AB (ref 12.0–15.0)
MCH: 42.9 pg — ABNORMAL HIGH (ref 26.0–34.0)
MCHC: 33.5 g/dL (ref 30.0–36.0)
MCV: 128.3 fL — ABNORMAL HIGH (ref 78.0–100.0)
Platelets: 168 10*3/uL (ref 150–400)
RBC: 2.19 MIL/uL — ABNORMAL LOW (ref 3.87–5.11)
RDW: 20.3 % — ABNORMAL HIGH (ref 11.5–15.5)
WBC: 16.2 10*3/uL — ABNORMAL HIGH (ref 4.0–10.5)

## 2015-07-07 LAB — GLUCOSE, CAPILLARY
GLUCOSE-CAPILLARY: 81 mg/dL (ref 65–99)
GLUCOSE-CAPILLARY: 73 mg/dL (ref 65–99)
GLUCOSE-CAPILLARY: 98 mg/dL (ref 65–99)
Glucose-Capillary: 88 mg/dL (ref 65–99)

## 2015-07-07 LAB — PROTIME-INR
INR: 1.58 — AB (ref 0.00–1.49)
PROTHROMBIN TIME: 18.9 s — AB (ref 11.6–15.2)

## 2015-07-07 LAB — RENAL FUNCTION PANEL
ALBUMIN: 1.7 g/dL — AB (ref 3.5–5.0)
Anion gap: 6 (ref 5–15)
BUN: 14 mg/dL (ref 6–20)
CALCIUM: 7 mg/dL — AB (ref 8.9–10.3)
CO2: 22 mmol/L (ref 22–32)
CREATININE: 0.51 mg/dL (ref 0.44–1.00)
Chloride: 117 mmol/L — ABNORMAL HIGH (ref 101–111)
GFR calc Af Amer: >60 mL/min (ref >60)
GFR calc non Af Amer: >60 mL/min (ref >60)
Glucose, Bld: 85 mg/dL (ref 65–99)
POTASSIUM: 3.6 mmol/L (ref 3.5–5.1)
Phosphorus: 2 mg/dL — ABNORMAL LOW (ref 2.5–4.6)
Sodium: 145 mmol/L (ref 135–145)

## 2015-07-07 LAB — MAGNESIUM: Magnesium: 1.9 mg/dL (ref 1.7–2.4)

## 2015-07-07 LAB — APTT: aPTT: 40 seconds — ABNORMAL HIGH (ref 24–37)

## 2015-07-07 MED ORDER — SODIUM CHLORIDE 0.45 % IV SOLN
INTRAVENOUS | Status: DC
  Administered 2015-07-07: 10 mL/h via INTRAVENOUS

## 2015-07-07 MED ORDER — VITAL HIGH PROTEIN PO LIQD
1000.0000 mL | ORAL | Status: DC
  Administered 2015-07-07 (×3)
  Filled 2015-07-07 (×2): qty 1000

## 2015-07-07 MED ORDER — SODIUM CHLORIDE 0.9 % IV BOLUS (SEPSIS)
500.0000 mL | Freq: Once | INTRAVENOUS | Status: AC
  Administered 2015-07-07: 500 mL via INTRAVENOUS

## 2015-07-07 MED ORDER — LACTULOSE 10 GM/15ML PO SOLN
30.0000 g | Freq: Three times a day (TID) | ORAL | Status: DC
  Administered 2015-07-07 – 2015-07-08 (×3): 30 g via ORAL
  Filled 2015-07-07 (×3): qty 45

## 2015-07-07 NOTE — Progress Notes (Signed)
[place] Progress Note Patient Name: Theresa [name] DOB: 1946/04/24 MRN: [id number]   Date of Service  07/07/2015  HPI/Events of Note  Oliguria. Urine output 175 mL over 12 hours. Question of hepatorenal.   eICU Interventions  Will bolus with 0.9 NaCl 500 mL IV over 30 minutes now.      Intervention Category Intermediate Interventions: Oliguria - evaluation and management  Sommer,Steven Eugene 07/07/2015, 6:56 PM

## 2015-07-07 NOTE — Progress Notes (Signed)
PULMONARY / CRITICAL CARE MEDICINE   Name: Theresa [name] MRN: [id number] DOB: 1946-11-21    ADMISSION DATE:  07/03/2015 CONSULTATION DATE:  07/04/15  REFERRING MD :  Dr. 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  The patient was recently diagnosed with UTI by PCP.  Admitted per [facility] for hepatic encephalopathy, ETOH withdrawal.  The patient subsequently decompensated on the afternoon of 7/27 and required intubation by anesthesia.  PCCM consulted for evaluation.   STUDIES:  7/26  CT head >> no ICH, mass or acute infarct  SIGNIFICANT EVENTS: 7/26  Admit with altered mental status, hepatic encephalopathy.  Recent UTI  7/27  Decompensation requiring intubation, PCCM consulted   SUBJECTIVE:   Output from ogt  VITAL SIGNS: Temp:  [97.7 F (36.5 C)-98.9 F (37.2 C)] 98.1 F (36.7 C) (07/30 0315) Pulse Rate:  [90-102] 102 (07/30 0600) Resp:  [0-24] 18 (07/30 0600) BP: (109-153)/(66-104) 130/81 mmHg (07/30 0600) SpO2:  [96 %-100 %] 99 % (07/30 0600) FiO2 (%):  [40 %] 40 % (07/30 0308) HEMODYNAMICS:   VENTILATOR SETTINGS: Vent Mode:  [-] PRVC FiO2 (%):  [40 %] 40 % Set Rate:  [15 bmp-16 bmp] 15 bmp Vt Set:  [400 mL-500 mL] 500 mL PEEP:  [5 cmH20] 5 cmH20 Pressure Support:  [5 cmH20] 5 cmH20 Plateau Pressure:  [10 cmH20-15 cmH20] 12 cmH20 INTAKE / OUTPUT:  Intake/Output Summary (Last 24 hours) at 07/07/15 0717 Last data filed at 07/07/15 0600  Gross per 24 hour  Intake   2103 ml  Output    865 ml  Net   1238 ml    PHYSICAL EXAMINATION: General: grimacing to pain, yellow Neuro: perr 5 mm, moves feet to sternal rub HEENT: jvd wnl PULM: CTA anterior CV: s1 s2 RRR no r slight distant GI: soft, bs hypo, NT, nd , obese Extremities: limited edema   LABS:  CBC  Recent Labs Lab 07/05/15 0424  07/06/15 0400 07/07/15 0422  WBC 15.0* 12.9* 16.2*  HGB 9.5* 9.0* 9.4*  HCT 27.5* 25.6* 28.1*  PLT 181 173 168   Coag's  Recent Labs Lab 07/05/15 0424 07/05/15 1100 07/06/15 0400 07/07/15 0422  APTT  --  37 39* 40*  INR 1.63*  --  1.67* 1.58*   BMET  Recent Labs Lab 07/05/15 0424 07/06/15 0400 07/07/15 0422  NA 142 143 145  K 3.5 4.0 3.6  CL 109 115* 117*  CO2 24 23 22   BUN 10 11 14   CREATININE 0.46 0.61 0.51  GLUCOSE 100* 100* 85   Electrolytes  Recent Labs Lab 07/03/15 1714  07/05/15 0424 07/06/15 0400 07/07/15 0422  CALCIUM  --   < > 7.3* 7.2* 7.0*  MG 1.5*  --   --  2.0 1.9  PHOS  --   --   --  1.5* 2.0*  < > = values in this interval not displayed. Sepsis Markers  Recent Labs Lab 07/04/15 2040 07/05/15 0424 07/05/15 0440 07/06/15 0400  LATICACIDVEN 1.9  --  1.6  --   PROCALCITON 0.61 0.51  --  0.34   ABG  Recent Labs Lab 07/04/15 1323 07/04/15 1715 07/05/15 0500  PHART 7.269* 7.357 7.404  PCO2ART 51.9* 43.1 37.7  PO2ART 63.5* 93.6 111*   Liver Enzymes  Recent Labs Lab 07/04/15 0400 07/05/15 0424 07/06/15  0400 07/06/15 0523 07/07/15 0422  AST 211* 186*  --  171*  --   ALT 71* 67*  --  72*  --   ALKPHOS 121 132*  --  129*  --   BILITOT 16.3* 15.5*  --  17.5*  --   ALBUMIN 1.8* 1.8* 1.6* 1.8* 1.7*   Cardiac Enzymes No results for input(s): TROPONINI, PROBNP in the last 168 hours. Glucose  Recent Labs Lab 07/06/15 0751 07/06/15 1140 07/06/15 1558 07/06/15 1942 07/06/15 2333 07/07/15 0311  GLUCAP 95 87 91 93 88 81    Imaging No results found.   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   P:   pcxr has improved Wean PEEP / FiO2 for saturations > 92% Daily SBT can perfrom, cpap 5 / ps 5, not able to extubate with current neurostatus PRN albuterol, dc if not a home  ed, will  confirm VAP prevention Holding systemic anticoagulation  CARDIOVASCULAR A:  Tachycardia resolved Hx HLD, SVT, PAD  P:  Continue to monitor on telemetry Allow pos balance  RENAL A:   Hypophosphatemia Hypomagnesemia -resolved UOP - decreasing but positive fluid balance & question early hepatorenal syndrome hyperchloremia P:   Avoid saline with cl rise Consider kvo Replace phos Chem in am   GASTROINTESTINAL A:   ETOH  Abdominal Pain, Nausea / Vomiting - seen by GI Severe Protein Calorie Malnutrition  Hx Esophageal Stricture s/p Dilation (0/24/09)  Alcoholic hepatitis  P:    Consider TF today, output is unimpressive to me in terms of volume, start at 10 cc/hr and advance n 24 hr if tolerated Pepcid BID for stress ulcer prophylaxis  Thiamine, folate, MVI  Lactulose & rifaximin, increase lactulose Prednisone  For alcoholic hepatitis day #7/35 Repeat lft in am   HEMATOLOGIC / ONCOLOGY  A:   Coagulopathy  Anemia  Chronic Anticoagulation for PE/DVT - one year ago Hx Infiltrating Ductal Carcinoma (2002) Slight hemoconcentration WBC on cbc  P:  Holding systemic anticoagulation - PE 1 year ago, however may consider heparin infusion in am  SCD's & heparin subcutaneous every 8 hours for DVT prophylaxis  INFECTIOUS A:   Possible UTI  Possible Bilateral LL Aspiration Pna  P:   Monitoring for fever Continuing to monitor daily leukocyte count with CBC  UC 7/27 >> negative  Rocephin, start date 7/27>>>  Will consider 7 days and dc  ENDOCRINE A:   Hypoglycemia - resolved.  P:   CBG Q4    NEUROLOGIC A:   Hepatic Encephalopathy  ETOH Abuse / Withdrawal   P:   RASS goal: 0 Lactulose increase dose Xifaxan maintain Thiamine / folate / MVI IV PRN Ativan, limit with bili, dc Continuing daily sedation vacation  fent if needed  Ccm time 30 min   Lavon Paganini. Titus Mould, MD, [place] Pgr: [place] [facility]

## 2015-07-07 NOTE — Progress Notes (Signed)
The husband and son would like to speak with social work and case management about rehab, after patient is extubated.

## 2015-07-08 ENCOUNTER — Inpatient Hospital Stay (HOSPITAL_COMMUNITY): Payer: Medicare Other

## 2015-07-08 DIAGNOSIS — R609 Edema, unspecified: Secondary | ICD-10-CM

## 2015-07-08 LAB — BASIC METABOLIC PANEL
ANION GAP: 5 (ref 5–15)
Anion gap: 4 — ABNORMAL LOW (ref 5–15)
BUN: 18 mg/dL (ref 6–20)
BUN: 19 mg/dL (ref 6–20)
CALCIUM: 7.6 mg/dL — AB (ref 8.9–10.3)
CO2: 24 mmol/L (ref 22–32)
CO2: 23 mmol/L (ref 22–32)
Calcium: 7.5 mg/dL — ABNORMAL LOW (ref 8.9–10.3)
Chloride: 119 mmol/L — ABNORMAL HIGH (ref 101–111)
Chloride: 116 mmol/L — ABNORMAL HIGH (ref 101–111)
Creatinine, Ser: 0.51 mg/dL (ref 0.44–1.00)
Creatinine, Ser: 0.54 mg/dL (ref 0.44–1.00)
GFR calc Af Amer: >60 mL/min (ref >60)
GFR calc non Af Amer: >60 mL/min (ref >60)
GLUCOSE: 121 mg/dL — AB (ref 65–99)
Glucose, Bld: 130 mg/dL — ABNORMAL HIGH (ref 65–99)
POTASSIUM: 4 mmol/L (ref 3.5–5.1)
Potassium: 3.4 mmol/L — ABNORMAL LOW (ref 3.5–5.1)
SODIUM: 144 mmol/L (ref 135–145)
Sodium: 147 mmol/L — ABNORMAL HIGH (ref 135–145)

## 2015-07-08 LAB — GLUCOSE, CAPILLARY
GLUCOSE-CAPILLARY: 135 mg/dL — AB (ref 65–99)
GLUCOSE-CAPILLARY: 131 mg/dL — AB (ref 65–99)
GLUCOSE-CAPILLARY: 142 mg/dL — AB (ref 65–99)
Glucose-Capillary: 141 mg/dL — ABNORMAL HIGH (ref 65–99)
Glucose-Capillary: 129 mg/dL — ABNORMAL HIGH (ref 65–99)
Glucose-Capillary: 124 mg/dL — ABNORMAL HIGH (ref 65–99)
Glucose-Capillary: 96 mg/dL (ref 65–99)
Glucose-Capillary: 120 mg/dL — ABNORMAL HIGH (ref 65–99)
Glucose-Capillary: 140 mg/dL — ABNORMAL HIGH (ref 65–99)

## 2015-07-08 LAB — RENAL FUNCTION PANEL
ALBUMIN: 1.6 g/dL — AB (ref 3.5–5.0)
Anion gap: 5 (ref 5–15)
BUN: 18 mg/dL (ref 6–20)
CALCIUM: 7.4 mg/dL — AB (ref 8.9–10.3)
CO2: 23 mmol/L (ref 22–32)
Chloride: 120 mmol/L — ABNORMAL HIGH (ref 101–111)
Creatinine, Ser: 0.53 mg/dL (ref 0.44–1.00)
Glucose, Bld: 120 mg/dL — ABNORMAL HIGH (ref 65–99)
Phosphorus: 1 mg/dL — CL (ref 2.5–4.6)
Potassium: 3.4 mmol/L — ABNORMAL LOW (ref 3.5–5.1)
Sodium: 148 mmol/L — ABNORMAL HIGH (ref 135–145)

## 2015-07-08 LAB — COMPREHENSIVE METABOLIC PANEL
ALBUMIN: 1.6 g/dL — AB (ref 3.5–5.0)
ALK PHOS: 120 U/L (ref 38–126)
ALT: 90 U/L — AB (ref 14–54)
ANION GAP: 7 (ref 5–15)
AST: 222 U/L — AB (ref 15–41)
BILIRUBIN TOTAL: 14.8 mg/dL — AB (ref 0.3–1.2)
BUN: 18 mg/dL (ref 6–20)
CHLORIDE: 118 mmol/L — AB (ref 101–111)
CO2: 23 mmol/L (ref 22–32)
Calcium: 7.6 mg/dL — ABNORMAL LOW (ref 8.9–10.3)
Creatinine, Ser: 0.53 mg/dL (ref 0.44–1.00)
GFR calc Af Amer: >60 mL/min (ref >60)
GFR calc non Af Amer: >60 mL/min (ref >60)
Glucose, Bld: 142 mg/dL — ABNORMAL HIGH (ref 65–99)
POTASSIUM: 3.6 mmol/L (ref 3.5–5.1)
SODIUM: 148 mmol/L — AB (ref 135–145)
Total Protein: 5.4 g/dL — ABNORMAL LOW (ref 6.5–8.1)

## 2015-07-08 LAB — APTT: aPTT: 40 seconds — ABNORMAL HIGH (ref 24–37)

## 2015-07-08 LAB — CBC WITH DIFFERENTIAL/PLATELET
BASOS ABS: 0 10*3/uL (ref 0.0–0.1)
Basophils Relative: 0 % (ref 0–1)
EOS PCT: 0 % (ref 0–5)
Eosinophils Absolute: 0 10*3/uL (ref 0.0–0.7)
HEMATOCRIT: 26.8 % — AB (ref 36.0–46.0)
HEMOGLOBIN: 9 g/dL — AB (ref 12.0–15.0)
LYMPHS ABS: 1.6 10*3/uL (ref 0.7–4.0)
Lymphocytes Relative: 12 % (ref 12–46)
MCH: 43.1 pg — ABNORMAL HIGH (ref 26.0–34.0)
MCHC: 33.6 g/dL (ref 30.0–36.0)
MCV: 128.2 fL — AB (ref 78.0–100.0)
MONOS PCT: 11 % (ref 3–12)
Monocytes Absolute: 1.5 10*3/uL — ABNORMAL HIGH (ref 0.1–1.0)
NEUTROS ABS: 10.6 10*3/uL — AB (ref 1.7–7.7)
Neutrophils Relative %: 77 % (ref 43–77)
Platelets: 176 10*3/uL (ref 150–400)
RBC: 2.09 MIL/uL — AB (ref 3.87–5.11)
RDW: 19.9 % — ABNORMAL HIGH (ref 11.5–15.5)
WBC: 13.7 10*3/uL — AB (ref 4.0–10.5)

## 2015-07-08 LAB — HEPARIN LEVEL (UNFRACTIONATED): Heparin Unfractionated: <0.1 IU/mL — ABNORMAL LOW (ref 0.30–0.70)

## 2015-07-08 LAB — PROTIME-INR
INR: 1.58 — AB (ref 0.00–1.49)
Prothrombin Time: 18.9 seconds — ABNORMAL HIGH (ref 11.6–15.2)

## 2015-07-08 LAB — FIBRINOGEN: Fibrinogen: 199 mg/dL — ABNORMAL LOW (ref 204–475)

## 2015-07-08 LAB — MAGNESIUM: Magnesium: 2 mg/dL (ref 1.7–2.4)

## 2015-07-08 MED ORDER — HEPARIN (PORCINE) IN NACL 100-0.45 UNIT/ML-% IJ SOLN
950.0000 [IU]/h | INTRAMUSCULAR | Status: DC
  Administered 2015-07-08 – 2015-07-09 (×2): 950 [IU]/h via INTRAVENOUS
  Filled 2015-07-08 (×3): qty 250

## 2015-07-08 MED ORDER — PRO-STAT SUGAR FREE PO LIQD
30.0000 mL | Freq: Two times a day (BID) | ORAL | Status: DC
  Administered 2015-07-08 – 2015-07-09 (×3): 30 mL
  Filled 2015-07-08 (×4): qty 30

## 2015-07-08 MED ORDER — VITAL AF 1.2 CAL PO LIQD
1000.0000 mL | ORAL | Status: DC
  Administered 2015-07-08 – 2015-07-09 (×2): 1000 mL
  Filled 2015-07-08 (×2): qty 1000

## 2015-07-08 MED ORDER — DEXTROSE 5 % IV SOLN
INTRAVENOUS | Status: DC
  Administered 2015-07-08 – 2015-07-11 (×5): via INTRAVENOUS

## 2015-07-08 MED ORDER — POTASSIUM PHOSPHATES 15 MMOLE/5ML IV SOLN
30.0000 mmol | Freq: Once | INTRAVENOUS | Status: AC
  Administered 2015-07-08: 30 mmol via INTRAVENOUS
  Filled 2015-07-08: qty 10

## 2015-07-08 MED ORDER — LACTULOSE 10 GM/15ML PO SOLN
30.0000 g | Freq: Two times a day (BID) | ORAL | Status: DC
  Administered 2015-07-08 – 2015-07-09 (×3): 30 g via ORAL
  Filled 2015-07-08 (×3): qty 45

## 2015-07-08 NOTE — Progress Notes (Signed)
VASCULAR LAB PRELIMINARY  PRELIMINARY  PRELIMINARY  PRELIMINARY  Bilateral upper extremity venous duplex completed.    Preliminary report:  There is no DVT noted in the bilateral upper extremities.  There is superficial thrombosis noted in the left cephalic vein (PICC line is in this vein) beginning in the [facility] and extending into the shoulder. There is significant interstitial fluid throughout the bilateral upper extremities, etiology unknown.   Nik Gorrell, RVT 07/08/2015, 6:09 PM

## 2015-07-08 NOTE — Progress Notes (Signed)
Nutrition Follow-up  DOCUMENTATION CODES:   Non-severe (moderate) malnutrition in context of chronic illness  INTERVENTION:   D/C Vital HP Initiate Vital AF 1.2 @ 40 ml/hr via OGT and increase by 5 ml every 4 hours to goal rate of 45 ml/hr.  30 ml Prostat BID.  Tube feeding regimen provides 1496 kcal (100% of needs), 111 grams of protein, and 876 ml of H2O.   RD to continue to monitor  NUTRITION DIAGNOSIS:   Inadequate oral intake related to inability to eat as evidenced by NPO status.  Ongoing.  GOAL:   Patient will meet greater than or equal to 90% of their needs  Progressing.  MONITOR:   Diet advancement, Weight trends, Labs, I & O's  REASON FOR ASSESSMENT:   Consult Enteral/tube feeding initiation and management  ASSESSMENT:   69 y.o. 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 Pt noted to ingest large amounts of ETOH on a daily basis. Pt has been recently trying to cut back ETOH intake but continues to drink. In the ED, alk phos noted to be elevated at 147 with ast of 287 and alt of 98 and total bili of 18.6. Ammonia noted to be 47. Head CT was unremarkable. Pt was started on CIWA and hospitalist consulted for admission.  Pt tolerating TF. Currently receiving Vital HP from TF protocol. Vital AF 1.2 meets her estimated needs. Ordered recommended TF regimen while intubated.  Patient is currently intubated on ventilator support MV: 5.9 L/min Temp (24hrs), Avg:98.6 F (37 C), Min:97.8 F (36.6 C), Max:99.4 F (37.4 C)  Propofol: none  Labs reviewed: CBGs: 124-135 Elevated Na Low K, Phos Mg WNL   Diet Order:  Diet NPO time specified  Skin:  Wound (see comment) (Stage II buttocks ulcer)  Last BM:  7/31  Height:   Ht Readings from Last 1 Encounters:  07/05/15 5' 2" (1.575 m)    Weight:   Wt Readings from Last 1 Encounters:  07/08/15 168  lb 3.4 oz (76.3 kg)    Ideal Body Weight:  50 kg  BMI:  Body mass index is 30.76 kg/(m^2).  Estimated Nutritional Needs:   Kcal:  1342  Protein:  100-110g  Fluid:  1.6L/day  EDUCATION NEEDS:   No education needs identified at this time  Clayton Bibles, MS, RD, LDN Pager: [phone] After Hours Pager: [phone]

## 2015-07-08 NOTE — Progress Notes (Signed)
[place] Progress Note Patient Name: Theresa [name] DOB: 10-Oct-1946 MRN: [id number]   Date of Service  07/08/2015  HPI/Events of Note  Superficial thrombosis noted on upper ext [place].  No DVT  eICU Interventions  Consider moving PICC to other arm in am     Intervention Category Minor Interventions: Other:  Mauri Brooklyn, Mamie Nick 07/08/2015, 6:59 PM

## 2015-07-08 NOTE — Progress Notes (Signed)
07-08-15  1850     Called to check pt's left arm, due to report of superficial thrombosis in left cephalic vein, extending from left ac, to shoulder area;  Both arms are edematous, discolored, pitting edema noted, even in hands;  picc was placed 7-29--16; less  edema noted above the picc  insertion site;  Spoke with RN who was in the pt's room; she will notify MD;  Raynelle Fanning RN IV Team

## 2015-07-08 NOTE — Progress Notes (Addendum)
PULMONARY / CRITICAL CARE MEDICINE   Name: Theresa [name] MRN: [id number] DOB: Aug 31, 1946    ADMISSION DATE:  07/03/2015 CONSULTATION DATE:  07/04/15  REFERRING MD :  Dr. 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  The patient was recently diagnosed with UTI by PCP.  Admitted per [facility] for hepatic encephalopathy, ETOH withdrawal.  The patient subsequently decompensated on the afternoon of 7/27 and required intubation by anesthesia.  PCCM consulted for evaluation.   STUDIES:  7/26  CT head >> no ICH, mass or acute infarct  SIGNIFICANT EVENTS: 7/26  Admit with altered mental status, hepatic encephalopathy.  Recent UTI  7/27  Decompensation requiring intubation, PCCM consulted   SUBJECTIVE:   Tolerated TF BM noted on lactulose concerns from RN about low output  VITAL SIGNS: Temp:  [97.9 F (36.6 C)-99.4 F (37.4 C)] 99 F (37.2 C) (07/31 0400) Pulse Rate:  [96-110] 97 (07/31 0600) Resp:  [12-24] 16 (07/31 0600) BP: (94-142)/(49-96) 97/73 mmHg (07/31 0606) SpO2:  [96 %-100 %] 96 % (07/31 0600) FiO2 (%):  [40 %] 40 % (07/31 0400) Weight:  [76.3 kg (168 lb 3.4 oz)] 76.3 kg (168 lb 3.4 oz) (07/31 0400) HEMODYNAMICS:   VENTILATOR SETTINGS: Vent Mode:  [-] PRVC FiO2 (%):  [40 %] 40 % Set Rate:  [15 bmp] 15 bmp Vt Set:  [500 mL] 500 mL PEEP:  [5 cmH20] 5 cmH20 Pressure Support:  [5 cmH20-8 cmH20] 8 cmH20 Plateau Pressure:  [11 cmH20-14 cmH20] 14 cmH20 INTAKE / OUTPUT:  Intake/Output Summary (Last 24 hours) at 07/08/15 0723 Last data filed at 07/08/15 0600  Gross per 24 hour  Intake  997.5 ml  Output   1485 ml  Net -487.5 ml    PHYSICAL EXAMINATION: General: grimacing to pain, yellow Neuro: perr 5 mm, moves feet to sternal rub and upper ext HEENT: jvd wnl PULM: CTA anterior CV: s1 s2 RRR no r slight  distant GI: soft, bs hypo, NT, nd , obese Extremities: no edema   LABS:  CBC  Recent Labs Lab 07/06/15 0400 07/07/15 0422 07/08/15 0415  WBC 12.9* 16.2* 13.7*  HGB 9.0* 9.4* 9.0*  HCT 25.6* 28.1* 26.8*  PLT 173 168 176   Coag's  Recent Labs Lab 07/06/15 0400 07/07/15 0422 07/08/15 0415  APTT 39* 40* 40*  INR 1.67* 1.58* 1.58*   BMET  Recent Labs Lab 07/06/15 0400 07/07/15 0422 07/08/15 0415  NA 143 145 148*  K 4.0 3.6 3.6  CL 115* 117* 118*  CO2 23 22 23   BUN 11 14 18   CREATININE 0.61 0.51 0.53  GLUCOSE 100* 85 142*   Electrolytes  Recent Labs Lab 07/06/15 0400 07/07/15 0422 07/08/15 0415  CALCIUM 7.2* 7.0* 7.6*  MG 2.0 1.9 2.0  PHOS 1.5* 2.0*  --    Sepsis Markers  Recent Labs Lab 07/04/15 2040 07/05/15 0424 07/05/15 0440 07/06/15 0400  LATICACIDVEN 1.9  --  1.6  --   PROCALCITON 0.61 0.51  --  0.34   ABG  Recent Labs Lab 07/04/15 1323 07/04/15 1715 07/05/15 0500  PHART 7.269* 7.357 7.404  PCO2ART 51.9* 43.1 37.7  PO2ART 63.5* 93.6 111*   Liver Enzymes  Recent Labs Lab 07/05/15 0424  07/06/15 0523 07/07/15 0422 07/08/15 0415  AST 186*  --  171*  --  222*  ALT 67*  --  72*  --  90*  ALKPHOS 132*  --  129*  --  120  BILITOT 15.5*  --  17.5*  --  14.8*  ALBUMIN 1.8*  < > 1.8* 1.7* 1.6*  < > = values in this interval not displayed. Cardiac Enzymes No results for input(s): TROPONINI, PROBNP in the last 168 hours. Glucose  Recent Labs Lab 07/06/15 2333 07/07/15 0311 07/07/15 0737 07/07/15 1207 07/07/15 2103 07/08/15 0104  GLUCAP 88 81 73 98 129* 135*    Imaging No results found.   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   P:   pcxr has improved Daily SBT, wean again cppa 5 ps 5, can wean for 4-6 hrs, unable to extubate until neurostatus improves VAP prevention Holding  systemic anticoagulation  CARDIOVASCULAR A:  Tachycardia resolved Hx HLD, SVT, PAD  P:  Continue to monitor on telemetry  RENAL A:   Urine output is appropriate for her weight- NO EVIDENCE ARF hyperchloremia P:   Avoid saline with cl rise NA rising from stool output, reduce lactulose slight  Add d5w Consider kvo Chem in am   GASTROINTESTINAL A:   ETOH  Abdominal Pain, Nausea / Vomiting - seen by GI Severe Protein Calorie Malnutrition  Hx Esophageal Stricture s/p Dilation (1/61/09)  Alcoholic hepatitis  P:    Continue TF to goal Pepcid BID Thiamine, folate, MVI  Lactulose & rifaximin, reduce lactulose to bid had projectile stool and neg 950 cc just in stool Prednisone  For alcoholic hepatitis per GI, concern ineffective Bili in am   HEMATOLOGIC / ONCOLOGY  A:   Coagulopathy  Anemia  Chronic Anticoagulation for PE/DVT - one year ago Hx Infiltrating Ductal Carcinoma (2002) Slight hemoconcentration WBC on cbc  P:  Holding systemic anticoagulation - PE 1 year ago, however may consider heparin infusion after ct done and no bleed SCD's & heparin subcutaneous every 8 hours for DVT prophylaxis  INFECTIOUS A:   Possible UTI  Possible Bilateral LL Aspiration Pna  P:   Monitoring for fever Continuing to monitor daily leukocyte count with CBC  UC 7/27 >> negative  Rocephin, start date 7/27>>>  Will consider 7 days ad stop date  ENDOCRINE A:   Hypoglycemia - resolved.  P:   CBG Q4    NEUROLOGIC A:   Hepatic Encephalopathy  ETOH Abuse / Withdrawal  At risk ICP elevation  P:   Neurostatus stagnant , last ammonia unimpressive = CT head eeg  RASS goal: 0 Lactulose to bid Xifaxan maintain Thiamine / folate / MVI IV NO benzo, see bili Continuing daily sedation vacation  fent if needed Correct na  Ccm time 30 min   Lavon Paganini. Titus Mould, MD, [place] Pgr: [place] [facility]

## 2015-07-08 NOTE — Progress Notes (Signed)
[place] Progress Note Patient Name: Theresa [name] DOB: [birth year] MRN: [id number]   Date of Service  07/08/2015  HPI/Events of Note  Patient on chronic anticoag as outpatient for history of thromboembolism  eICU Interventions  Heparin drip started     Intervention Category Minor Interventions: Other:  Mauri [place], P 07/08/2015, 10:50 PM

## 2015-07-08 NOTE — Progress Notes (Signed)
ANTICOAGULATION CONSULT NOTE - Initial Consult  Pharmacy Consult for IV Heparin Indication: VTE Treatment/Hx PE (on Xarelto PTA)  Allergies  Allergen Reactions  . Ciprofloxacin Hives  . Sulfa Antibiotics Hives    Patient Measurements: Height: 5\' 2"  (157.5 cm) Weight: 168 lb 3.4 oz (76.3 kg) IBW/kg (Calculated) : 50.1 Heparin Dosing Weight: 57 kg  Vital Signs: Temp: 99.6 F (37.6 C) (07/31 2313) Temp Source: Axillary (07/31 2313) BP: 92/67 mmHg (07/31 1945) Pulse Rate: 126 (07/31 2318)  Labs:  Recent Labs  07/06/15 0400 07/07/15 0422 07/08/15 0415 07/08/15 0731 07/08/15 1940  HGB 9.0* 9.4* 9.0*  --   --   HCT 25.6* 28.1* 26.8*  --   --   PLT 173 168 176  --   --   APTT 39* 40* 40*  --   --   LABPROT 19.7* 18.9* 18.9*  --   --   INR 1.67* 1.58* 1.58*  --   --   CREATININE 0.61 0.51 0.53 0.53  0.51 0.54    Estimated Creatinine Clearance: 63.5 mL/min (by C-G formula based on Cr of 0.54).   Medical History: Past Medical History  Diagnosis Date  . Esophageal stricture   . HLD (hyperlipidemia)   . Dysthymic disorder   . Other and unspecified coagulation defects   . Diaphragmatic hernia without mention of obstruction or gangrene   . PAD (peripheral artery disease)   . Pulmonary embolism 06/2014    "both lungs"  . DVT (deep venous thrombosis) 06/2014    RLE  . GERD (gastroesophageal reflux disease)   . History of hiatal hernia   . Anxiety   . Malignant neoplasm of breast (female), unspecified site 2002    "right", NO BLOOD PRESSURES OR STICKS IN RIGHT ARM  . SVT (supraventricular tachycardia) 11/07/2014  . On home oxygen therapy     "2L at night and prn" (11/08/2014)  . Atherosclerosis   . Alcohol abuse   . Chronic respiratory failure   . Colitis     Medications:  Prescriptions prior to admission  Medication Sig Dispense Refill Last Dose  . ALPRAZolam (XANAX) 0.25 MG tablet Take 0.25 mg by mouth daily.    07/02/2015 at Unknown time  . cephALEXin (KEFLEX)  500 MG capsule Take 500 mg by mouth 3 (three) times daily.  0 07/03/2015 at Unknown time  . clobetasol ointment (TEMOVATE) 0.05 % Apply 9.83 application topically as needed. itching  1 Past Week at Unknown time  . DULoxetine (CYMBALTA) 30 MG capsule Take 30 mg by mouth daily.  0 07/03/2015 at Unknown time  . escitalopram (LEXAPRO) 10 MG tablet Take 10 mg by mouth daily.  0 Past Week at Unknown time  . ivabradine (CORLANOR) 5 MG TABS tablet Take 1 tablet (5 mg total) by mouth 2 (two) times daily with a meal. 180 tablet 1 Past Week at Unknown time  . loperamide (IMODIUM) 2 MG capsule Take 2 mg by mouth daily as needed for diarrhea or loose stools.   Past Month at Unknown time  . metoCLOPramide (REGLAN) 10 MG tablet Take 1 tablet (10 mg total) by mouth 3 (three) times daily before meals. 30 tablet 2 Past Month at Unknown time  . pantoprazole (PROTONIX) 40 MG tablet Take 40 mg by mouth 2 (two) times daily.   Past Week at Unknown time  . XARELTO 15 MG TABS tablet Take 15 mg by mouth daily.  0 07/02/2015 at 2200  . Fe Fum-FA-B Cmp-C-Zn-Mg-Mn-Cu (CENTRATEX) 106-1 MG CAPS  Take 1 tablet by mouth daily.  0 unknown  . omeprazole (PRILOSEC) 20 MG capsule Take 1 capsule (20 mg total) by mouth daily. (Patient not taking: Reported on 05/24/2015) 30 capsule 6 Not Taking at Unknown time  . ondansetron (ZOFRAN-ODT) 4 MG disintegrating tablet Take 1 tablet (4 mg total) by mouth 2 (two) times daily. vomiting (Patient not taking: Reported on 07/03/2015) 60 tablet 6 Not Taking at Unknown time   Scheduled:  . antiseptic oral rinse  7 mL Mouth Rinse q12n4p  . cefTRIAXone (ROCEPHIN)  IV  2 g Intravenous Q24H  . chlorhexidine  15 mL Mouth Rinse BID  . feeding supplement (PRO-STAT SUGAR FREE 64)  30 mL Per Tube BID  . folic acid  1 mg Intravenous Daily  . lactulose  30 g Oral BID  . multivitamin  5 mL Per Tube Daily  . prednisoLONE  40 mg Per Tube QAC breakfast  . ranitidine  150 mg Per Tube BID  . rifaximin  400 mg Per Tube  3 times per day  . sodium chloride  10-40 mL Intracatheter Q12H  . sodium chloride  3 mL Intravenous Q12H  . thiamine  100 mg Intravenous Daily   Infusions:  . dextrose 50 mL/hr at 07/08/15 0759  . feeding supplement (VITAL AF 1.2 CAL) 1,000 mL (07/08/15 1124)  . heparin      Assessment:  69 yr female with significant PMH including ETOH abuse, HTN, DVT & PE.  Patient  on Xarelto 15mg  daily PTA with reported last dose on 7/25 @ 22:00   Liver enzymes elevated  Pertinent anticoagulation labs: aPtt = 40 , INR=1.59, HL=  Goal of Therapy:  Heparin level 0.3-0.7 units/ml Monitor platelets by anticoagulation protocol: Yes   Plan:   Heparin 950 units/hr (9.5 ml/hr)  No bolus  Daily HL/CBC  Check 1st HL 8/1 0600  Dorrene German 07/08/2015,11:24 PM

## 2015-07-09 ENCOUNTER — Other Ambulatory Visit (HOSPITAL_COMMUNITY): Payer: Medicare Other

## 2015-07-09 DIAGNOSIS — J988 Other specified respiratory disorders: Secondary | ICD-10-CM

## 2015-07-09 LAB — CBC WITH DIFFERENTIAL/PLATELET
Basophils Absolute: 0 K/uL (ref 0.0–0.1)
Basophils Relative: 0 % (ref 0–1)
Eosinophils Absolute: 0 K/uL (ref 0.0–0.7)
Eosinophils Relative: 0 % (ref 0–5)
HCT: 26.3 % — ABNORMAL LOW (ref 36.0–46.0)
Hemoglobin: 8.8 g/dL — ABNORMAL LOW (ref 12.0–15.0)
Lymphocytes Relative: 14 % (ref 12–46)
Lymphs Abs: 2.2 K/uL (ref 0.7–4.0)
MCH: 42.7 pg — ABNORMAL HIGH (ref 26.0–34.0)
MCHC: 33.5 g/dL (ref 30.0–36.0)
MCV: 127.7 fL — ABNORMAL HIGH (ref 78.0–100.0)
Monocytes Absolute: 2.1 K/uL — ABNORMAL HIGH (ref 0.1–1.0)
Monocytes Relative: 13 % — ABNORMAL HIGH (ref 3–12)
Neutro Abs: 11.6 K/uL — ABNORMAL HIGH (ref 1.7–7.7)
Neutrophils Relative %: 73 % (ref 43–77)
Platelets: 170 K/uL (ref 150–400)
RBC: 2.06 MIL/uL — ABNORMAL LOW (ref 3.87–5.11)
RDW: 18.9 % — ABNORMAL HIGH (ref 11.5–15.5)
WBC: 15.9 K/uL — ABNORMAL HIGH (ref 4.0–10.5)

## 2015-07-09 LAB — PROTIME-INR
INR: 1.53 — ABNORMAL HIGH (ref 0.00–1.49)
Prothrombin Time: 18.4 seconds — ABNORMAL HIGH (ref 11.6–15.2)

## 2015-07-09 LAB — GLUCOSE, CAPILLARY
GLUCOSE-CAPILLARY: 119 mg/dL — AB (ref 65–99)
Glucose-Capillary: 133 mg/dL — ABNORMAL HIGH (ref 65–99)
Glucose-Capillary: 125 mg/dL — ABNORMAL HIGH (ref 65–99)
Glucose-Capillary: 144 mg/dL — ABNORMAL HIGH (ref 65–99)
Glucose-Capillary: 158 mg/dL — ABNORMAL HIGH (ref 65–99)

## 2015-07-09 LAB — BASIC METABOLIC PANEL
ANION GAP: 4 — AB (ref 5–15)
Anion gap: 4 — ABNORMAL LOW (ref 5–15)
BUN: 20 mg/dL (ref 6–20)
BUN: 20 mg/dL (ref 6–20)
CO2: 25 mmol/L (ref 22–32)
CO2: 24 mmol/L (ref 22–32)
Calcium: 7.6 mg/dL — ABNORMAL LOW (ref 8.9–10.3)
Calcium: 7.9 mg/dL — ABNORMAL LOW (ref 8.9–10.3)
Chloride: 115 mmol/L — ABNORMAL HIGH (ref 101–111)
Chloride: 114 mmol/L — ABNORMAL HIGH (ref 101–111)
Creatinine, Ser: 0.54 mg/dL (ref 0.44–1.00)
GFR calc non Af Amer: >60 mL/min (ref >60)
Glucose, Bld: 125 mg/dL — ABNORMAL HIGH (ref 65–99)
Glucose, Bld: 157 mg/dL — ABNORMAL HIGH (ref 65–99)
POTASSIUM: 4.7 mmol/L (ref 3.5–5.1)
Potassium: 3.5 mmol/L (ref 3.5–5.1)
SODIUM: 144 mmol/L (ref 135–145)
SODIUM: 142 mmol/L (ref 135–145)

## 2015-07-09 LAB — RENAL FUNCTION PANEL
Albumin: 1.5 g/dL — ABNORMAL LOW (ref 3.5–5.0)
Anion gap: 5 (ref 5–15)
BUN: 20 mg/dL (ref 6–20)
CO2: 25 mmol/L (ref 22–32)
Calcium: 7.7 mg/dL — ABNORMAL LOW (ref 8.9–10.3)
Chloride: 114 mmol/L — ABNORMAL HIGH (ref 101–111)
Creatinine, Ser: 0.5 mg/dL (ref 0.44–1.00)
GFR calc Af Amer: >60 mL/min
GFR calc non Af Amer: >60 mL/min
Glucose, Bld: 139 mg/dL — ABNORMAL HIGH (ref 65–99)
Phosphorus: 1.8 mg/dL — ABNORMAL LOW (ref 2.5–4.6)
Potassium: 3.6 mmol/L (ref 3.5–5.1)
Sodium: 144 mmol/L (ref 135–145)

## 2015-07-09 LAB — COMPREHENSIVE METABOLIC PANEL
ALT: 104 U/L — ABNORMAL HIGH (ref 14–54)
AST: 243 U/L — ABNORMAL HIGH (ref 15–41)
Albumin: 1.5 g/dL — ABNORMAL LOW (ref 3.5–5.0)
Alkaline Phosphatase: 115 U/L (ref 38–126)
Anion gap: 4 — ABNORMAL LOW (ref 5–15)
BUN: 21 mg/dL — ABNORMAL HIGH (ref 6–20)
CALCIUM: 7.6 mg/dL — AB (ref 8.9–10.3)
CHLORIDE: 113 mmol/L — AB (ref 101–111)
CO2: 25 mmol/L (ref 22–32)
Creatinine, Ser: 0.52 mg/dL (ref 0.44–1.00)
GFR calc Af Amer: >60 mL/min (ref >60)
GFR calc non Af Amer: >60 mL/min (ref >60)
Glucose, Bld: 138 mg/dL — ABNORMAL HIGH (ref 65–99)
Potassium: 3.6 mmol/L (ref 3.5–5.1)
Sodium: 142 mmol/L (ref 135–145)
TOTAL PROTEIN: 5.3 g/dL — AB (ref 6.5–8.1)
Total Bilirubin: 12.1 mg/dL — ABNORMAL HIGH (ref 0.3–1.2)

## 2015-07-09 LAB — ETHYL GLUCURONIDE, URINE
Ethyl glucuronide, quant ur confirm: 1018 ng/mL — ABNORMAL HIGH (ref <500)
Ethyl sulfate: 765 ng/mL — ABNORMAL HIGH (ref <100)

## 2015-07-09 LAB — FIBRINOGEN: FIBRINOGEN: 206 mg/dL (ref 204–475)

## 2015-07-09 LAB — HEPARIN LEVEL (UNFRACTIONATED): Heparin Unfractionated: 0.28 IU/mL — ABNORMAL LOW (ref 0.30–0.70)

## 2015-07-09 LAB — MAGNESIUM: Magnesium: 1.9 mg/dL (ref 1.7–2.4)

## 2015-07-09 MED ORDER — CHLORHEXIDINE GLUCONATE 0.12 % MT SOLN
15.0000 mL | Freq: Two times a day (BID) | OROMUCOSAL | Status: DC
  Administered 2015-07-09 – 2015-07-15 (×14): 15 mL via OROMUCOSAL
  Filled 2015-07-09 (×9): qty 15

## 2015-07-09 MED ORDER — VITAL AF 1.2 CAL PO LIQD
1000.0000 mL | ORAL | Status: DC
  Administered 2015-07-10: 1000 mL
  Filled 2015-07-09 (×5): qty 1000

## 2015-07-09 MED ORDER — POTASSIUM CHLORIDE 20 MEQ/15ML (10%) PO SOLN
40.0000 meq | Freq: Once | ORAL | Status: AC
  Administered 2015-07-09: 40 meq
  Filled 2015-07-09: qty 30

## 2015-07-09 MED ORDER — SODIUM CHLORIDE 0.9 % IJ SOLN
10.0000 mL | INTRAMUSCULAR | Status: DC | PRN
  Administered 2015-07-09 (×2): 10 mL
  Filled 2015-07-09 (×2): qty 40

## 2015-07-09 MED ORDER — CETYLPYRIDINIUM CHLORIDE 0.05 % MT LIQD
7.0000 mL | Freq: Four times a day (QID) | OROMUCOSAL | Status: DC
  Administered 2015-07-09 – 2015-07-15 (×26): 7 mL via OROMUCOSAL

## 2015-07-09 MED ORDER — LACTULOSE 10 GM/15ML PO SOLN
30.0000 g | Freq: Three times a day (TID) | ORAL | Status: DC
  Administered 2015-07-09 – 2015-07-10 (×5): 30 g via ORAL
  Filled 2015-07-09 (×5): qty 45

## 2015-07-09 MED ORDER — HEPARIN (PORCINE) IN NACL 100-0.45 UNIT/ML-% IJ SOLN
1050.0000 [IU]/h | INTRAMUSCULAR | Status: DC
  Administered 2015-07-09: 1050 [IU]/h via INTRAVENOUS
  Filled 2015-07-09: qty 250

## 2015-07-09 MED ORDER — SODIUM CHLORIDE 0.9 % IJ SOLN
10.0000 mL | Freq: Two times a day (BID) | INTRAMUSCULAR | Status: DC
  Administered 2015-07-09 – 2015-07-18 (×15): 10 mL

## 2015-07-09 NOTE — Progress Notes (Addendum)
[place] Progress Note Patient Name: Theresa [name] DOB: 1946/10/02 MRN: [id number]   Date of Service  07/09/2015  HPI/Events of Note  L arm more swollen, L anticubital area more discolored and L hand more swollen and discolored. Patient has superficial thrombosis noted in the left cephalic vein (PICC line is in this vein) beginning in the [facility] and extending into the shoulder. Patient will likely need the PICC line removed, however, she has no other venous access. Will defer removal until alternate venous access can be secured. Bedside nurse states that the patient has palpable radial and ulnar pulses in the L arm.  eICU Interventions  Will order: 1. Hold Heparin IV infusion until patient is re-evaluated by primary team.  2. Elevate R arm.     Intervention Category Intermediate Interventions: Other:  Fani Rotondo Cornelia Copa 07/09/2015, 6:04 AM

## 2015-07-09 NOTE — Progress Notes (Signed)
NUTRITION NOTE  Patient is currently intubated on ventilator support MV: 9.9 L/min Temp (24hrs), Avg:99 F (37.2 C), Min:98.2 F (36.8 C), Max:100.2 F (37.9 C)  Propofol: none  Re-estimated needs based on current Ve: Kcal: 1605 Protein: 90-112 grams  Pt currently receiving Vital AF 1.2 @ 45 mL/hr with Prostat once/day which is providing 1396 kcal, 96 grams protein, and 876 mL free water; not meeting needs.  Will change TF regimen: Vital AF 1.2 @ 55 mL/hr to provide 1584 kcal (99% needs), 99 grams protein, and 1071 mL free water.  Drips: Heparin @ 950 units/hour.  RD will continue to follow per protocol.    Jarome Matin, RD, LDN Inpatient Clinical Dietitian Pager # [phone] After hours/weekend pager # [phone]

## 2015-07-09 NOTE — Progress Notes (Signed)
PULMONARY / CRITICAL CARE MEDICINE   Name: Theresa [name] MRN: [id number] DOB: 1946/02/04    ADMISSION DATE:  07/03/2015 CONSULTATION DATE:  07/04/15  REFERRING MD :  Dr. Clementeen Graham   CHIEF COMPLAINT:  Respiratory Failure   INITIAL PRESENTATION: 69 yo female presented with weakness and confusion after being tx for UTI as outpt.  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  Admitted with hepatic encephalopathy, VDRF, and ETOH withdrawal.  STUDIES:  7/26 CT head >> no ICH, mass or acute infarct 7/31 CT head: no acute findings 7/31 LUE [place]: superficial thrombus; begins AC end extends into shoulder.   SIGNIFICANT EVENTS: 7/26  Admit with altered mental status, hepatic encephalopathy.  Recent UTI  7/27  Decompensation requiring intubation, PCCM consulted  7/27 steroids started for ETOH hepatitis  7/31 left UE swelling. Large superficial thrombus in arm of PICC  VITAL SIGNS: Temp:  [98.2 F (36.8 C)-100.2 F (37.9 C)] 98.2 F (36.8 C) (08/01 0800) Pulse Rate:  [103-126] 106 (08/01 0600) Resp:  [14-30] 16 (08/01 0600) BP: (92-156)/(37-85) 127/60 mmHg (08/01 0600) SpO2:  [99 %-100 %] 100 % (08/01 0600) FiO2 (%):  [30 %-40 %] 30 % (08/01 0752) Weight:  [74.7 kg (164 lb 10.9 oz)] 74.7 kg (164 lb 10.9 oz) (08/01 0057) HEMODYNAMICS:   VENTILATOR SETTINGS: Vent Mode:  [-] PRVC FiO2 (%):  [30 %-40 %] 30 % Set Rate:  [15 bmp] 15 bmp Vt Set:  [500 mL] 500 mL PEEP:  [5 cmH20] 5 cmH20 Pressure Support:  [8 cmH20] 8 cmH20 Plateau Pressure:  [12 cmH20-19 cmH20] 12 cmH20   INTAKE / OUTPUT:  Intake/Output Summary (Last 24 hours) at 07/09/15 0815 Last data filed at 07/09/15 0600  Gross per 24 hour  Intake 3186.83 ml  Output   1220 ml  Net 1966.83 ml    PHYSICAL EXAMINATION: General: grimacing to pain, jaundice  Neuro: perrL 5 mm, moves feet to sternal rub and upper ext HEENT: jvd wnl PULM: CTA anterior, scattered rhonchi CV: s1 s2 RRR no r slight  distant GI: soft, bs hypo, NT, nd , obese Extremities:diffuse anasarca and ecchymosis    LABS:  CBC  Recent Labs Lab 07/07/15 0422 07/08/15 0415 07/09/15 0535  WBC 16.2* 13.7* 15.9*  HGB 9.4* 9.0* 8.8*  HCT 28.1* 26.8* 26.3*  PLT 168 176 170   Coag's  Recent Labs Lab 07/06/15 0400 07/07/15 0422 07/08/15 0415  APTT 39* 40* 40*  INR 1.67* 1.58* 1.58*   BMET  Recent Labs Lab 07/08/15 0731 07/08/15 1940 07/09/15 0535  NA 148*  147* 144 144  142  K 3.4*  3.4* 4.0 3.6  3.6  CL 120*  119* 116* 114*  113*  CO2 23  24 23 25  25   BUN 18  18 19 20   21*  CREATININE 0.53  0.51 0.54 0.50  0.52  GLUCOSE 120*  121* 130* 139*  138*   Electrolytes  Recent Labs Lab 07/07/15 0422 07/08/15 0415 07/08/15 0731 07/08/15 1940 07/09/15 0535  CALCIUM 7.0* 7.6* 7.4*  7.5* 7.6* 7.7*  7.6*  MG 1.9 2.0  --   --  1.9  PHOS 2.0*  --  1.0*  --  1.8*   Sepsis Markers  Recent Labs Lab 07/04/15 2040 07/05/15 0424 07/05/15 0440 07/06/15 0400  LATICACIDVEN 1.9  --  1.6  --   PROCALCITON 0.61 0.51  --  0.34   ABG  Recent Labs Lab 07/04/15 1323 07/04/15  1715 07/05/15 0500  PHART 7.269* 7.357 7.404  PCO2ART 51.9* 43.1 37.7  PO2ART 63.5* 93.6 111*   Liver Enzymes  Recent Labs Lab 07/06/15 0523  07/08/15 0415 07/08/15 0731 07/09/15 0535  AST 171*  --  222*  --  243*  ALT 72*  --  90*  --  104*  ALKPHOS 129*  --  120  --  115  BILITOT 17.5*  --  14.8*  --  12.1*  ALBUMIN 1.8*  < > 1.6* 1.6* 1.5*  1.5*  < > = values in this interval not displayed.  Glucose  Recent Labs Lab 07/08/15 1131 07/08/15 1619 07/08/15 2003 07/08/15 2312 07/09/15 0331 07/09/15 0745  GLUCAP 96 142* 120* 140* 133* 125*    Imaging Ct Head Wo Contrast  07/08/2015   CLINICAL DATA:  Hepatic encephalopathy.  EXAM: CT HEAD WITHOUT CONTRAST  TECHNIQUE: Contiguous axial images were obtained from the base of the skull through the vertex without intravenous contrast.   COMPARISON:  Radiograph 07/03/2015  FINDINGS: No acute intracranial hemorrhage. No focal mass lesion. No CT evidence of acute infarction. No midline shift or mass effect. No hydrocephalus. Basilar cisterns are patent.  There is mild periventricular white matter hypodensities. Age-appropriate mild cortical atrophy.  Paranasal sinuses and  mastoid air cells are clear.  IMPRESSION: 1. No acute intracranial findings. 2. Mild white matter microvascular disease.   Electronically Signed   By: Suzy Bouchard M.D.   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   P:   VAP prevention PSV as tolerates.  PRN CXR   CARDIOVASCULAR A:  Tachycardia resolved Hx HLD, SVT, PAD  P:  Continue to monitor on telemetry  RENAL A:   Urine output is appropriate for her weight- NO EVIDENCE ARF hyperchloremia P:   Avoid saline with cl rise NA rising from stool output, reduce lactulose slight  Cont D5W Consider kvo Chem in am   GASTROINTESTINAL A:   ETOH  Abdominal Pain, Nausea / Vomiting - seen by GI Severe Protein Calorie Malnutrition  Hx Esophageal Stricture s/p Dilation (2/97/98)  Alcoholic hepatitis Hepatic steatosis LFTs elevated some  P:    Continue TF to goal Pepcid BID Thiamine, folate, MVI  Lactulose & rifaximin, reduced lactulose to bid had projectile stool  Prednisone  For alcoholic hepatitis per GI day #4/28 Trend BILI   HEMATOLOGIC / ONCOLOGY  A:   Coagulopathy  Anemia  Chronic Anticoagulation for PE/DVT - one year ago Hx Infiltrating Ductal Carcinoma (2002) Superficial thrombus Left arm Slight hemoconcentration WBC on cbc  P:  D/c PICC line; place in right OR place R IJ CVL  Elevate LUE Resume IV heparin  Trend cbc Transfuse per typical ICU protocol  INFECTIOUS A:   Possible  UTI  Possible Bilateral LL Aspiration Pna UC 7/27 >> negative P:   Monitoring for fever Continuing to monitor daily leukocyte count with CBC Rocephin, start date 7/27>>> Will consider 7 days ad stop date  ENDOCRINE A:   Hypoglycemia - resolved.  P:   CBG Q4    NEUROLOGIC A:   Hepatic Encephalopathy  ETOH Abuse / Withdrawal  At risk ICP elevation  CT head obtained 7/31 w/out acute findings.  P:    eeg today  RASS goal: 0 Lactulose to bid Xifaxan maintain Thiamine /  folate / MVI IV NO benzo Continuing daily sedation vacation  fent if needed Correct na   Summary statement  No sig improvement.. Need to be sure not seizing, monitor for new infections and cont supportive care. To date no objective data to suggest this is irreversible, but she is at high risk for recurring complications. For today goal is to ensure no seizure and change out PICC. Otherwise simply cont supportive care.   Erick Colace ACNP-BC [place] Pager # [phone] OR # [phone] if no answer   Reviewed above, examined.  She remains unresponsive.  Has not received any sedation/analgesia since 7/29.  CT head was unremarkable.  EEG scheduled for today.  She is jaundiced and liver enzymes remained elevated.  She is tolerating pressure support, but mental status precludes extubation trial.  IV team will be changing PICC line in Lt arm.  Resume heparin gtt after this.  Continue prednisone and lactulose.  Updated pt's family at bedside.    CC time by me independent of APP time is 35 minutes.  Chesley Mires, MD [facility] Pulmonary/Critical Care 07/09/2015, 11:12 AM Pager:  [phone] After 3pm call: [phone]

## 2015-07-09 NOTE — Progress Notes (Signed)
L PICC D/C per MD order/41cm intact/vaseline pressure dsg applied/no bleeding noted/floor RN aware.

## 2015-07-09 NOTE — Progress Notes (Signed)
Peripherally Inserted Central Catheter/Midline Placement  The IV Nurse has discussed with the patient and/or persons authorized to consent for the patient, the purpose of this procedure and the potential benefits and risks involved with this procedure.  The benefits include less needle sticks, lab draws from the catheter and patient may be discharged home with the catheter.  Risks include, but not limited to, infection, bleeding, blood clot (thrombus formation), and puncture of an artery; nerve damage and irregular heat beat.  Alternatives to this procedure were also discussed.  PICC/Midline Placement Documentation  PICC / Midline Double Lumen 51/70/01 PICC Left Cephalic 41 cm 1 cm (Active)  Indication for Insertion or Continuance of Line Limited venous access - need for IV therapy >5 days (PICC only) 07/09/2015  8:00 AM  Exposed Catheter (cm) 1 cm 07/05/2015 11:15 AM  Site Assessment Edematous;Clean;Dry;Intact 07/09/2015  8:00 AM  Lumen #1 Status Infusing 07/09/2015  8:00 AM  Lumen #2 Status Flushed;Saline locked 07/09/2015  8:00 AM  Dressing Type Transparent;Occlusive 07/09/2015  8:00 AM  Dressing Status Clean;Dry;Intact;Antimicrobial disc in place 07/09/2015  8:00 AM  Line Care Connections checked and tightened 07/09/2015  8:00 AM  Dressing Change Due 07/12/15 07/09/2015  8:00 AM     PICC / Midline Double Lumen 74/94/49 PICC Right Basilic 40 cm 0 cm (Active)  Indication for Insertion or Continuance of Line Limited venous access - need for IV therapy >5 days (PICC only) 07/09/2015 11:00 AM  Exposed Catheter (cm) 0 cm 07/09/2015 11:00 AM  Dressing Change Due 07/16/15 07/09/2015 11:00 AM       Jule Economy Horton 07/09/2015, 11:40 AM

## 2015-07-09 NOTE — Progress Notes (Signed)
ANTICOAGULATION CONSULT NOTE - Follow up  Pharmacy Consult for IV Heparin Indication: VTE Treatment/Hx PE (on Xarelto PTA)  Allergies  Allergen Reactions  . Ciprofloxacin Hives  . Sulfa Antibiotics Hives    Patient Measurements: Height: 5\' 2"  (157.5 cm) Weight: 164 lb 10.9 oz (74.7 kg) IBW/kg (Calculated) : 50.1 Heparin Dosing Weight: 57 kg  Vital Signs: Temp: 98.1 F (36.7 C) (08/01 2000) Temp Source: Axillary (08/01 2000) BP: 138/59 mmHg (08/01 2053) Pulse Rate: 107 (08/01 2053)  Labs:  Recent Labs  07/07/15 0422 07/08/15 0415  07/08/15 1940 07/08/15 2300 07/09/15 0535 07/09/15 0800 07/09/15 1245 07/09/15 2035  HGB 9.4* 9.0*  --   --   --  8.8*  --   --   --   HCT 28.1* 26.8*  --   --   --  26.3*  --   --   --   PLT 168 176  --   --   --  170  --   --   --   APTT 40* 40*  --   --   --   --   --   --   --   LABPROT 18.9* 18.9*  --   --   --   --   --  18.4*  --   INR 1.58* 1.58*  --   --   --   --   --  1.53*  --   HEPARINUNFRC  --   --   --   --  <0.10*  --   --   --  0.28*  CREATININE 0.51 0.53  < > 0.54  --  0.50  0.52 0.54  --   --   < > = values in this interval not displayed.  Estimated Creatinine Clearance: 62.8 mL/min (by C-G formula based on Cr of 0.54).   Medical History: Past Medical History  Diagnosis Date  . Esophageal stricture   . HLD (hyperlipidemia)   . Dysthymic disorder   . Other and unspecified coagulation defects   . Diaphragmatic hernia without mention of obstruction or gangrene   . PAD (peripheral artery disease)   . Pulmonary embolism 06/2014    "both lungs"  . DVT (deep venous thrombosis) 06/2014    RLE  . GERD (gastroesophageal reflux disease)   . History of hiatal hernia   . Anxiety   . Malignant neoplasm of breast (female), unspecified site 2002    "right", NO BLOOD PRESSURES OR STICKS IN RIGHT ARM  . SVT (supraventricular tachycardia) 11/07/2014  . On home oxygen therapy     "2L at night and prn" (11/08/2014)  .  Atherosclerosis   . Alcohol abuse   . Chronic respiratory failure   . Colitis     Assessment: 69 yr female with significant PMH including ETOH abuse, HTN, DVT & PE.Patient was on Xarelto 15mg  daily PTA with reported last dose on 7/25 @ 22:00.  Heparin drip was started while patient was NPO then later discontinued by CCM due to liver disease and since patient had completed treatment course for PE after 1 year.  On 7/31, heparin infusion resumed after LUE [place] found large superficial thrombus in arm of PICC.  Heparin held 8/1 AM for IV Team to change PICC to right arm and resumed after placement.  Today, 07/09/2015:  Heparin infusion resumed at previous rate of 950 units/hr this afternoon.  Liver enzymes elevated  Hgb low but relatively stable. Platelets WNL/stable.  CrCl~62 ml/min  Heparin level  at 2035 = 0.28, slightly subtherapeutic  No bleeding/line issues reported per nursing.  Goal of Therapy:  Heparin level 0.3-0.7 units/ml Monitor platelets by anticoagulation protocol: Yes   Plan:   Increase heparin drip to 1050 units/hr.  Check heparin level 6 hours after heparin rate change.  Daily CBC and heparin level.  Monitor closely for s/s of bleeding.   Lindell Spar, PharmD, BCPS Pager: [phone] 07/09/2015 9:29 PM

## 2015-07-09 NOTE — Progress Notes (Signed)
Date:  July 09, 2015 U.R. performed for needs and level of care. Remain on full vent support. Will continue to follow for Case Management needs.  Velva Harman, RN, BSN, [place]   [phone]

## 2015-07-09 NOTE — Progress Notes (Signed)
ANTICOAGULATION CONSULT NOTE - Follow up  Pharmacy Consult for IV Heparin Indication: VTE Treatment/Hx PE (on Xarelto PTA)  Allergies  Allergen Reactions  . Ciprofloxacin Hives  . Sulfa Antibiotics Hives    Patient Measurements: Height: 5\' 2"  (157.5 cm) Weight: 164 lb 10.9 oz (74.7 kg) IBW/kg (Calculated) : 50.1 Heparin Dosing Weight: 57 kg  Vital Signs: Temp: 98.6 F (37 C) (08/01 1200) Temp Source: Oral (08/01 1200) BP: 96/38 mmHg (08/01 1203) Pulse Rate: 107 (08/01 0820)  Labs:  Recent Labs  07/07/15 0422 07/08/15 0415  07/08/15 1940 07/08/15 2300 07/09/15 0535 07/09/15 0800  HGB 9.4* 9.0*  --   --   --  8.8*  --   HCT 28.1* 26.8*  --   --   --  26.3*  --   PLT 168 176  --   --   --  170  --   APTT 40* 40*  --   --   --   --   --   LABPROT 18.9* 18.9*  --   --   --   --   --   INR 1.58* 1.58*  --   --   --   --   --   HEPARINUNFRC  --   --   --   --  <0.10*  --   --   CREATININE 0.51 0.53  < > 0.54  --  0.50  0.52 0.54  < > = values in this interval not displayed.  Estimated Creatinine Clearance: 62.8 mL/min (by C-G formula based on Cr of 0.54).   Medical History: Past Medical History  Diagnosis Date  . Esophageal stricture   . HLD (hyperlipidemia)   . Dysthymic disorder   . Other and unspecified coagulation defects   . Diaphragmatic hernia without mention of obstruction or gangrene   . PAD (peripheral artery disease)   . Pulmonary embolism 06/2014    "both lungs"  . DVT (deep venous thrombosis) 06/2014    RLE  . GERD (gastroesophageal reflux disease)   . History of hiatal hernia   . Anxiety   . Malignant neoplasm of breast (female), unspecified site 2002    "right", NO BLOOD PRESSURES OR STICKS IN RIGHT ARM  . SVT (supraventricular tachycardia) 11/07/2014  . On home oxygen therapy     "2L at night and prn" (11/08/2014)  . Atherosclerosis   . Alcohol abuse   . Chronic respiratory failure   . Colitis     Assessment: 69 yr female with  significant PMH including ETOH abuse, HTN, DVT & PE.Patient was on Xarelto 15mg  daily PTA with reported last dose on 7/25 @ 22:00.  Heparin drip was started while patient was NPO then later discontinued by CCM due to liver disease and since patient had completed treatment course for PE after 1 year.  On 7/31, heparin infusion resumed after LUE [place] found large superficial thrombus in arm of PICC.  Heparin held 8/1 AM for IV Team to change PICC to right arm and resumed after placement.  Today, 07/09/2015:  Heparin infusion resumed at previous rate of 950 units/hr this afternoon.  Liver enzymes elevated  Hgb low but relatively stable. Platelets WNL/stable.  CrCl~62 ml/min  Goal of Therapy:  Heparin level 0.3-0.7 units/ml Monitor platelets by anticoagulation protocol: Yes   Plan:  F/u heparin level this evening.  Hershal Coria 07/09/2015,1:02 PM

## 2015-07-09 NOTE — Progress Notes (Addendum)
Patient ID: Theresa [name], female   DOB: [birth year], 69 y.o.   MRN: [id number]   Following peripherally- notes reviewed  Remains intubated Acute encephalopathy, and ETOH hepatitis- DF was 38 so started steroids last week Also on CIWA Nothing new to add to critical care management.   ________________________________________________________________________  Velora Heckler GI MD note:  I personally examined the patient, reviewed the data and agree with the assessment and plan described above.  Currently on xifaxan 550bid, lactulose via NG tube, prednisolone for severe alc hepatitis.   Owens Loffler, MD [facility] Gastroenterology Pager [phone]

## 2015-07-09 NOTE — Care Management Important Message (Signed)
Important Message  Patient Details  Name: Theresa [name] MRN: [id number] Date of Birth: July 11, 1946   Medicare Important Message Given:  Yes-third notification given    Shelda Altes 07/09/2015, 4:05 PMImportant Message  Patient Details  Name: Theresa [name] MRN: [id number] Date of Birth: Oct 29, 1946   Medicare Important Message Given:  Yes-third notification given    Shelda Altes 07/09/2015, 4:04 PM

## 2015-07-10 ENCOUNTER — Inpatient Hospital Stay (HOSPITAL_COMMUNITY): Payer: Medicare Other

## 2015-07-10 ENCOUNTER — Inpatient Hospital Stay (HOSPITAL_COMMUNITY)
Admit: 2015-07-10 | Discharge: 2015-07-10 | Disposition: A | Discharge status: Home / Self Care | Payer: Medicare Other | Attending: Acute Care | Admitting: Acute Care

## 2015-07-10 DIAGNOSIS — R4182 Altered mental status, unspecified: Secondary | ICD-10-CM

## 2015-07-10 LAB — RENAL FUNCTION PANEL
ALBUMIN: 1.5 g/dL — AB (ref 3.5–5.0)
Anion gap: 5 (ref 5–15)
BUN: 21 mg/dL — AB (ref 6–20)
CHLORIDE: 115 mmol/L — AB (ref 101–111)
CO2: 25 mmol/L (ref 22–32)
CREATININE: 0.42 mg/dL — AB (ref 0.44–1.00)
Calcium: 7.7 mg/dL — ABNORMAL LOW (ref 8.9–10.3)
GFR calc Af Amer: >60 mL/min (ref >60)
Glucose, Bld: 149 mg/dL — ABNORMAL HIGH (ref 65–99)
PHOSPHORUS: 1.4 mg/dL — AB (ref 2.5–4.6)
Potassium: 3.9 mmol/L (ref 3.5–5.1)
Sodium: 145 mmol/L (ref 135–145)

## 2015-07-10 LAB — GLUCOSE, CAPILLARY
GLUCOSE-CAPILLARY: 130 mg/dL — AB (ref 65–99)
GLUCOSE-CAPILLARY: 126 mg/dL — AB (ref 65–99)
GLUCOSE-CAPILLARY: 125 mg/dL — AB (ref 65–99)
GLUCOSE-CAPILLARY: 149 mg/dL — AB (ref 65–99)
Glucose-Capillary: 135 mg/dL — ABNORMAL HIGH (ref 65–99)
Glucose-Capillary: 135 mg/dL — ABNORMAL HIGH (ref 65–99)

## 2015-07-10 LAB — APTT: APTT: 164 s — AB (ref 24–37)

## 2015-07-10 LAB — CBC
HEMATOCRIT: 25 % — AB (ref 36.0–46.0)
Hemoglobin: 8.1 g/dL — ABNORMAL LOW (ref 12.0–15.0)
MCH: 41.3 pg — AB (ref 26.0–34.0)
MCHC: 32.4 g/dL (ref 30.0–36.0)
MCV: 127.6 fL — ABNORMAL HIGH (ref 78.0–100.0)
PLATELETS: 144 10*3/uL — AB (ref 150–400)
RBC: 1.96 MIL/uL — AB (ref 3.87–5.11)
RDW: 18 % — ABNORMAL HIGH (ref 11.5–15.5)
WBC: 13.4 10*3/uL — ABNORMAL HIGH (ref 4.0–10.5)

## 2015-07-10 LAB — PROTIME-INR
INR: 1.62 — ABNORMAL HIGH (ref 0.00–1.49)
Prothrombin Time: 19.3 seconds — ABNORMAL HIGH (ref 11.6–15.2)

## 2015-07-10 LAB — HEPARIN LEVEL (UNFRACTIONATED)
HEPARIN UNFRACTIONATED: 0.41 [IU]/mL (ref 0.30–0.70)
Heparin Unfractionated: 0.29 IU/mL — ABNORMAL LOW (ref 0.30–0.70)
Heparin Unfractionated: 0.29 IU/mL — ABNORMAL LOW (ref 0.30–0.70)

## 2015-07-10 LAB — FIBRINOGEN: FIBRINOGEN: 246 mg/dL (ref 204–475)

## 2015-07-10 LAB — MAGNESIUM: MAGNESIUM: 1.9 mg/dL (ref 1.7–2.4)

## 2015-07-10 MED ORDER — POTASSIUM & SODIUM PHOSPHATES 280-160-250 MG PO PACK
2.0000 | PACK | Freq: Three times a day (TID) | ORAL | Status: AC
  Administered 2015-07-10 (×3): 2 via ORAL
  Filled 2015-07-10 (×3): qty 2

## 2015-07-10 MED ORDER — MAGNESIUM SULFATE 2 GM/50ML IV SOLN
2.0000 g | Freq: Once | INTRAVENOUS | Status: AC
  Administered 2015-07-10: 2 g via INTRAVENOUS
  Filled 2015-07-10: qty 50

## 2015-07-10 MED ORDER — FOLIC ACID 1 MG PO TABS
1.0000 mg | ORAL_TABLET | Freq: Every day | ORAL | Status: DC
  Administered 2015-07-11 – 2015-07-15 (×5): 1 mg
  Filled 2015-07-10 (×5): qty 1

## 2015-07-10 MED ORDER — VITAMIN B-1 100 MG PO TABS
100.0000 mg | ORAL_TABLET | Freq: Every day | ORAL | Status: DC
  Administered 2015-07-11 – 2015-07-15 (×5): 100 mg
  Filled 2015-07-10 (×5): qty 1

## 2015-07-10 MED ORDER — HEPARIN (PORCINE) IN NACL 100-0.45 UNIT/ML-% IJ SOLN
1200.0000 [IU]/h | INTRAMUSCULAR | Status: DC
  Administered 2015-07-10 – 2015-07-11 (×2): 1200 [IU]/h via INTRAVENOUS
  Filled 2015-07-10 (×5): qty 250

## 2015-07-10 MED ORDER — HEPARIN (PORCINE) IN NACL 100-0.45 UNIT/ML-% IJ SOLN
1100.0000 [IU]/h | INTRAMUSCULAR | Status: DC
  Filled 2015-07-10: qty 250

## 2015-07-10 NOTE — Progress Notes (Signed)
[place] Progress Note Patient Name: DERA VANAKEN DOB: 1946-03-13 MRN: [id number]   Date of Service  07/10/2015  HPI/Events of Note    eICU Interventions  hypophos -repleted     Intervention Category Intermediate Interventions: Electrolyte abnormality - evaluation and management  Jolin Benavides V. 07/10/2015, 5:49 AM

## 2015-07-10 NOTE — Progress Notes (Signed)
PULMONARY / CRITICAL CARE MEDICINE   Name: Theresa [name] MRN: [id number] DOB: 29-Jul-1946    ADMISSION DATE:  07/03/2015 CONSULTATION DATE:  07/04/15  REFERRING MD :  Dr. Clementeen Graham   CHIEF COMPLAINT:  Respiratory Failure   INITIAL PRESENTATION: 69 yo female presented with weakness and confusion after being tx for UTI as outpt.  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  Admitted with hepatic encephalopathy, VDRF, and ETOH withdrawal.  STUDIES:  7/26 CT head >> no ICH, mass or acute infarct 7/31 CT head: no acute findings 7/31 LUE [place]: superficial thrombus; begins AC end extends into shoulder.  8/2 EEG>>>  SIGNIFICANT EVENTS: 7/26  Admit with altered mental status, hepatic encephalopathy.  Recent UTI  7/27  Decompensation requiring intubation, PCCM consulted  7/27 steroids started for ETOH hepatitis  7/31 left UE swelling. Large superficial thrombus in arm of PICC 8/1 weaned all day. PICC replaced and left PICC removed. Heparin resumed.  8/2 weaning. Localizing to noxious stim. EEG done  VITAL SIGNS: Temp:  [97.8 F (36.6 C)-99.4 F (37.4 C)] 98.2 F (36.8 C) (08/02 0805) Pulse Rate:  [82-107] 88 (08/02 0800) Resp:  [14-24] 18 (08/02 0800) BP: (91-158)/(35-105) 144/105 mmHg (08/02 0800) SpO2:  [100 %] 100 % (08/02 0800) FiO2 (%):  [30 %] 30 % (08/02 0805) Weight:  [74.3 kg (163 lb 12.8 oz)] 74.3 kg (163 lb 12.8 oz) (08/02 0134) HEMODYNAMICS:   VENTILATOR SETTINGS: Vent Mode:  [-] Other (Comment) FiO2 (%):  [30 %] 30 % Set Rate:  [15 bmp-16 bmp] 15 bmp Vt Set:  [500 mL] 500 mL PEEP:  [5 cmH20] 5 cmH20 Pressure Support:  [5 cmH20] 5 cmH20 Plateau Pressure:  [14 cmH20-17 cmH20] 14 cmH20   INTAKE / OUTPUT:  Intake/Output Summary (Last 24 hours) at 07/10/15 0841 Last data filed at 07/10/15 0800  Gross per 24 hour  Intake 3155.32 ml  Output   1785 ml  Net 1370.32 ml    PHYSICAL EXAMINATION: General: grimacing to pain, jaundice   Neuro: perrL 5 mm, a little more purposeful  HEENT: jvd wnl PULM:  scattered rhonchi CV: s1 s2 RRR no r slight distant GI: soft, bs hypo, NT, nd , obese Extremities:diffuse anasarca and ecchymosis    LABS:  CBC  Recent Labs Lab 07/08/15 0415 07/09/15 0535 07/10/15 0355  WBC 13.7* 15.9* 13.4*  HGB 9.0* 8.8* 8.1*  HCT 26.8* 26.3* 25.0*  PLT 176 170 144*   Coag's  Recent Labs Lab 07/07/15 0422 07/08/15 0415 07/09/15 1245 07/10/15 0355  APTT 40* 40*  --  164*  INR 1.58* 1.58* 1.53* 1.62*   BMET  Recent Labs Lab 07/09/15 0800 07/09/15 2035 07/10/15 0355  NA 144 142 145  K 3.5 4.7 3.9  CL 115* 114* 115*  CO2 25 24 25   BUN 20 20 21*  CREATININE 0.54 <0.30* 0.42*  GLUCOSE 125* 157* 149*   Electrolytes  Recent Labs Lab 07/08/15 0415 07/08/15 0731  07/09/15 0535 07/09/15 0800 07/09/15 2035 07/10/15 0355  CALCIUM 7.6* 7.4*  7.5*  < > 7.7*  7.6* 7.6* 7.9* 7.7*  MG 2.0  --   --  1.9  --   --  1.9  PHOS  --  1.0*  --  1.8*  --   --  1.4*  < > = values in this interval not displayed. Sepsis Markers  Recent Labs Lab 07/04/15 2040 07/05/15 0424 07/05/15 0440 07/06/15 0400  LATICACIDVEN 1.9  --  1.6  --   PROCALCITON 0.61 0.51  --  0.34   ABG  Recent Labs Lab 07/04/15 1323 07/04/15 1715 07/05/15 0500  PHART 7.269* 7.357 7.404  PCO2ART 51.9* 43.1 37.7  PO2ART 63.5* 93.6 111*   Liver Enzymes  Recent Labs Lab 07/06/15 0523  07/08/15 0415 07/08/15 0731 07/09/15 0535 07/10/15 0355  AST 171*  --  222*  --  243*  --   ALT 72*  --  90*  --  104*  --   ALKPHOS 129*  --  120  --  115  --   BILITOT 17.5*  --  14.8*  --  12.1*  --   ALBUMIN 1.8*  < > 1.6* 1.6* 1.5*  1.5* 1.5*  < > = values in this interval not displayed.  Glucose  Recent Labs Lab 07/09/15 1202 07/09/15 1559 07/09/15 1945 07/10/15 0018 07/10/15 0444 07/10/15 0734  GLUCAP 119* 144* 158* 135* 130* 126*    Imaging Dg Chest Port 1 View  07/10/2015   CLINICAL DATA:   Acute respiratory failure.  EXAM: PORTABLE CHEST - 1 VIEW  COMPARISON:  07/08/2015 .  FINDINGS: Interim removal left PICC line. Right PICC line present with tip at cavoatrial junction. Enteric tube stable position. Heart size stable. Chronic interstitial prominence. Low lung volumes with bibasilar atelectasis. Mild infiltrate left lung base cannot be excluded. Small left pleural effusion. No pneumothorax.  IMPRESSION: 1. Interim removal of left PICC line. Right PICC line has been placed. Its tip is at the cavoatrial junction. Endotracheal tube and NG tube in good anatomic position. 2. Low lung volumes with bibasilar atelectasis. Mild left base infiltrate cannot be excluded. Small left pleural effusion. 3. Chronic interstitial lung disease.   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  Tolerates PSV of 5 cmH2O  P:   VAP prevention PSV as tolerates.  PRN CXR   CARDIOVASCULAR A:  Tachycardia resolved Hx HLD, SVT, PAD  P:  Continue to monitor on telemetry  RENAL A:   Urine output is appropriate for her weight- NO EVIDENCE ARF Hyperchloremia Hypophosphatemia P:   Avoid saline with cl rise Cont D5W Chem in am  Replaced PO4, will recheck in am   GASTROINTESTINAL A:   ETOH  Abdominal Pain, Nausea / Vomiting - seen by GI Severe Protein Calorie Malnutrition  Hx Esophageal Stricture s/p Dilation (7/98/92)  Alcoholic hepatitis Hepatic steatosis LFTs elevated some PCM  P:    Continue TF to goal Pepcid BID Thiamine, folate, MVI  Lactulose & rifaximin, reduced lactulose to bid had projectile stool  Prednisone  For alcoholic hepatitis per GI day #5/28 Trend BILI   HEMATOLOGIC / ONCOLOGY  A:   Coagulopathy  Anemia  Chronic Anticoagulation  for PE/DVT - one year ago Hx Infiltrating Ductal Carcinoma (2002) Superficial thrombus Left arm (PICC line) Slight hemoconcentration WBC on cbc  P:  Elevate LUE cont IV heparin  Trend cbc Transfuse per typical ICU protocol  INFECTIOUS A:   Possible UTI  Possible Bilateral LL Aspiration Pna UC 7/27 >> negative P:   Monitoring for fever Continuing to monitor daily leukocyte count with CBC Rocephin, start date 7/27>>> Will consider 7 days ad stop date  ENDOCRINE A:   Hypoglycemia - resolved.  P:   CBG Q4   NEUROLOGIC A:   Hepatic Encephalopathy  ETOH Abuse / Withdrawal  At risk ICP elevation  CT head obtained 7/31 w/out acute findings.  P:   eeg today  RASS goal: 0 Lactulose bid Xifaxan maintain Thiamine / folate / MVI IV NO benzo Continuing daily sedation vacation  fent if needed   Summary statement  No sig improvement., doubt seizing, but need to ensure. Will monitor for new infections and cont supportive care.   Erick Colace ACNP-BC [place] Pager # [phone] OR # [phone] if no answer  She is tolerating pressure support >> mental status precludes extubation trial.  She grimaces with painful stimuli, and moves extremities w/o purpose.  Does not follow commands.  Remains jaundiced.  Heart rate regular, no wheeze, abdomen soft, decreased edema.  Labs show Hb 8.1, PLT 144, INR 1.62.  CXR shows ATX.  Continue vent support until mental status better.  Continue heparin gtt for hx of PE/VT and UE DVT.  Continue lactulose, rifaximin.  Continue prednisone for ETOH hepatitis.  F/u EEG.  CC time 35 minutes by me independent of APP time.  Chesley Mires, MD [facility] Pulmonary/Critical Care 07/10/2015, 10:40 AM Pager:  [phone] After 3pm call: [phone]

## 2015-07-10 NOTE — Progress Notes (Signed)
ANTICOAGULATION CONSULT NOTE - Follow up  Pharmacy Consult for IV Heparin Indication: VTE Treatment/Hx PE (on Xarelto PTA)  Allergies  Allergen Reactions  . Ciprofloxacin Hives  . Sulfa Antibiotics Hives    Patient Measurements: Height: 5\' 2"  (157.5 cm) Weight: 163 lb 12.8 oz (74.3 kg) IBW/kg (Calculated) : 50.1 Heparin Dosing Weight: 57 kg  Vital Signs: Temp: 97.4 F (36.3 C) (08/02 1134) Temp Source: Axillary (08/02 1134) BP: 106/34 mmHg (08/02 1200) Pulse Rate: 89 (08/02 1200)  Labs:  Recent Labs  07/08/15 0415  07/09/15 0535 07/09/15 0800 07/09/15 1245 07/09/15 2035 07/10/15 0355 07/10/15 0400 07/10/15 1120  HGB 9.0*  --  8.8*  --   --   --  8.1*  --   --   HCT 26.8*  --  26.3*  --   --   --  25.0*  --   --   PLT 176  --  170  --   --   --  144*  --   --   APTT 40*  --   --   --   --   --  164*  --   --   LABPROT 18.9*  --   --   --  18.4*  --  19.3*  --   --   INR 1.58*  --   --   --  1.53*  --  1.62*  --   --   HEPARINUNFRC  --   < >  --   --   --  0.28*  --  0.29* 0.29*  CREATININE 0.53  < > 0.50  0.52 0.54  --  <0.30* 0.42*  --   --   < > = values in this interval not displayed.  Estimated Creatinine Clearance: 62.7 mL/min (by C-G formula based on Cr of 0.42).   Medical History: Past Medical History  Diagnosis Date  . Esophageal stricture   . HLD (hyperlipidemia)   . Dysthymic disorder   . Other and unspecified coagulation defects   . Diaphragmatic hernia without mention of obstruction or gangrene   . PAD (peripheral artery disease)   . Pulmonary embolism 06/2014    "both lungs"  . DVT (deep venous thrombosis) 06/2014    RLE  . GERD (gastroesophageal reflux disease)   . History of hiatal hernia   . Anxiety   . Malignant neoplasm of breast (female), unspecified site 2002    "right", NO BLOOD PRESSURES OR STICKS IN RIGHT ARM  . SVT (supraventricular tachycardia) 11/07/2014  . On home oxygen therapy     "2L at night and prn" (11/08/2014)  .  Atherosclerosis   . Alcohol abuse   . Chronic respiratory failure   . Colitis     Assessment: 69 yr female with significant PMH including ETOH abuse, HTN, DVT & PE.Patient was on Xarelto 15mg  daily PTA with reported last dose on 7/25 @ 22:00.  Heparin drip was started while patient was NPO then later discontinued by CCM due to liver disease and since patient had completed treatment course for PE after 1 year.  On 7/31, heparin infusion resumed after LUE [place] found large superficial thrombus in arm of PICC.  Heparin held 8/1 AM for IV Team to change PICC to right arm and resumed after placement.  Today, 07/10/2015:   Heparin level remains slightly therapeutic at 0.29 despite increasing the rate to 1100 units/hr.  Liver enzymes elevated  Hgb 8.1, platelets 144K. Entire CBC drifted down (was slightly hemoconcentrated).  CrCl~62 ml/min  No bleeding/line issues reported.  Goal of Therapy:  Heparin level 0.3-0.7 units/ml Monitor platelets by anticoagulation protocol: Yes   Plan:   Increase heparin drip to 1200 units/hr.  Check heparin level 6 hours after heparin rate change.  Daily CBC and heparin level.  Monitor closely for s/s of bleeding.   Hershal Coria, PharmD, BCPS Pager: [phone] 07/10/2015 12:36 PM

## 2015-07-10 NOTE — Progress Notes (Signed)
ANTICOAGULATION CONSULT NOTE - Follow up  Pharmacy Consult for IV Heparin Indication: VTE Treatment/Hx PE (on Xarelto PTA)  Allergies  Allergen Reactions  . Ciprofloxacin Hives  . Sulfa Antibiotics Hives    Patient Measurements: Height: 5\' 2"  (157.5 cm) Weight: 163 lb 12.8 oz (74.3 kg) IBW/kg (Calculated) : 50.1 Heparin Dosing Weight: 57 kg  Vital Signs: Temp: 99.3 F (37.4 C) (08/02 0000) Temp Source: Oral (08/02 0000) BP: 105/50 mmHg (08/02 0427) Pulse Rate: 89 (08/02 0427)  Labs:  Recent Labs  07/08/15 0415  07/08/15 2300 07/09/15 0535 07/09/15 0800 07/09/15 1245 07/09/15 2035 07/10/15 0355 07/10/15 0400  HGB 9.0*  --   --  8.8*  --   --   --  8.1*  --   HCT 26.8*  --   --  26.3*  --   --   --  25.0*  --   PLT 176  --   --  170  --   --   --  144*  --   APTT 40*  --   --   --   --   --   --   --   --   LABPROT 18.9*  --   --   --   --  18.4*  --   --   --   INR 1.58*  --   --   --   --  1.53*  --   --   --   HEPARINUNFRC  --   --  <0.10*  --   --   --  0.28*  --  0.29*  CREATININE 0.53  < >  --  0.50  0.52 0.54  --  <0.30* 0.42*  --   < > = values in this interval not displayed.  Estimated Creatinine Clearance: 62.7 mL/min (by C-G formula based on Cr of 0.42).   Medical History: Past Medical History  Diagnosis Date  . Esophageal stricture   . HLD (hyperlipidemia)   . Dysthymic disorder   . Other and unspecified coagulation defects   . Diaphragmatic hernia without mention of obstruction or gangrene   . PAD (peripheral artery disease)   . Pulmonary embolism 06/2014    "both lungs"  . DVT (deep venous thrombosis) 06/2014    RLE  . GERD (gastroesophageal reflux disease)   . History of hiatal hernia   . Anxiety   . Malignant neoplasm of breast (female), unspecified site 2002    "right", NO BLOOD PRESSURES OR STICKS IN RIGHT ARM  . SVT (supraventricular tachycardia) 11/07/2014  . On home oxygen therapy     "2L at night and prn" (11/08/2014)  .  Atherosclerosis   . Alcohol abuse   . Chronic respiratory failure   . Colitis     Assessment: 69 yr female with significant PMH including ETOH abuse, HTN, DVT & PE.Patient was on Xarelto 15mg  daily PTA with reported last dose on 7/25 @ 22:00.  Heparin drip was started while patient was NPO then later discontinued by CCM due to liver disease and since patient had completed treatment course for PE after 1 year.  On 7/31, heparin infusion resumed after LUE [place] found large superficial thrombus in arm of PICC.  Heparin held 8/1 AM for IV Team to change PICC to right arm and resumed after placement.  07/09/15  Heparin infusion resumed at previous rate of 950 units/hr this afternoon.  Liver enzymes elevated  Hgb low but relatively stable. Platelets WNL/stable.  CrCl~62 ml/min  Heparin level at 2035 = 0.28, slightly subtherapeutic  No bleeding/line issues reported per nursing. Today, 8/2  0400 HL=0.29, RN briefly stopped heparin to draw labs (not HL, that was from foot), no other problems reported.  Will increase rate only slightly.   Goal of Therapy:  Heparin level 0.3-0.7 units/ml Monitor platelets by anticoagulation protocol: Yes   Plan:   Increase heparin drip to 1100 units/hr.  Check heparin level 6 hours after heparin rate change.  Daily CBC and heparin level.  Monitor closely for s/s of bleeding.    Dorrene German 07/09/2015 9:29 PM

## 2015-07-10 NOTE — Progress Notes (Signed)
ANTICOAGULATION CONSULT NOTE - Follow up  Pharmacy Consult for IV Heparin Indication: VTE Treatment/Hx PE (on Xarelto PTA)  Allergies  Allergen Reactions  . Ciprofloxacin Hives  . Sulfa Antibiotics Hives    Patient Measurements: Height: 5\' 2"  (157.5 cm) Weight: 163 lb 12.8 oz (74.3 kg) IBW/kg (Calculated) : 50.1 Heparin Dosing Weight: 57 kg  Vital Signs: Temp: 98.1 F (36.7 C) (08/02 1616) Temp Source: Oral (08/02 1616) BP: 119/59 mmHg (08/02 1956) Pulse Rate: 93 (08/02 1956)  Labs:  Recent Labs  07/08/15 0415  07/09/15 0535 07/09/15 0800 07/09/15 1245 07/09/15 2035 07/10/15 0355 07/10/15 0400 07/10/15 1120 07/10/15 1928  HGB 9.0*  --  8.8*  --   --   --  8.1*  --   --   --   HCT 26.8*  --  26.3*  --   --   --  25.0*  --   --   --   PLT 176  --  170  --   --   --  144*  --   --   --   APTT 40*  --   --   --   --   --  164*  --   --   --   LABPROT 18.9*  --   --   --  18.4*  --  19.3*  --   --   --   INR 1.58*  --   --   --  1.53*  --  1.62*  --   --   --   HEPARINUNFRC  --   < >  --   --   --  0.28*  --  0.29* 0.29* 0.41  CREATININE 0.53  < > 0.50  0.52 0.54  --  <0.30* 0.42*  --   --   --   < > = values in this interval not displayed.  Estimated Creatinine Clearance: 62.7 mL/min (by C-G formula based on Cr of 0.42).   Medical History: Past Medical History  Diagnosis Date  . Esophageal stricture   . HLD (hyperlipidemia)   . Dysthymic disorder   . Other and unspecified coagulation defects   . Diaphragmatic hernia without mention of obstruction or gangrene   . PAD (peripheral artery disease)   . Pulmonary embolism 06/2014    "both lungs"  . DVT (deep venous thrombosis) 06/2014    RLE  . GERD (gastroesophageal reflux disease)   . History of hiatal hernia   . Anxiety   . Malignant neoplasm of breast (female), unspecified site 2002    "right", NO BLOOD PRESSURES OR STICKS IN RIGHT ARM  . SVT (supraventricular tachycardia) 11/07/2014  . On home oxygen  therapy     "2L at night and prn" (11/08/2014)  . Atherosclerosis   . Alcohol abuse   . Chronic respiratory failure   . Colitis     Assessment: 69 yr female with significant PMH including ETOH abuse, HTN, DVT & PE.Patient was on Xarelto 15mg  daily PTA with reported last dose on 7/25 @ 22:00.  Heparin drip was started while patient was NPO then later discontinued by CCM due to liver disease and since patient had completed treatment course for PE after 1 year.  On 7/31, heparin infusion resumed after LUE [place] found large superficial thrombus in arm of PICC.  Heparin held 8/1 AM for IV Team to change PICC to right arm and resumed after placement.  Today, 07/10/2015:   Heparin level now therapeutic at 0.41  on 1200 units/hr.  Liver enzymes elevated  Hgb 8.1, platelets 144K. Entire CBC drifted down (was slightly hemoconcentrated).  CrCl~62 ml/min  No bleeding/line issues reported.  Goal of Therapy:  Heparin level 0.3-0.7 units/ml Monitor platelets by anticoagulation protocol: Yes   Plan:   Continue heparin at 1200 units/hr.  Daily CBC and heparin level.  Monitor closely for s/s of bleeding.  Peggyann Juba, PharmD, BCPS Pager: [phone]  07/10/2015 8:07 PM

## 2015-07-10 NOTE — Progress Notes (Signed)
Offsite EEG completed at [facility]. Results pending. 

## 2015-07-11 LAB — GLUCOSE, CAPILLARY
GLUCOSE-CAPILLARY: 99 mg/dL (ref 65–99)
GLUCOSE-CAPILLARY: 126 mg/dL — AB (ref 65–99)
Glucose-Capillary: 181 mg/dL — ABNORMAL HIGH (ref 65–99)
Glucose-Capillary: 95 mg/dL (ref 65–99)
Glucose-Capillary: 99 mg/dL (ref 65–99)

## 2015-07-11 LAB — CBC
HEMATOCRIT: 26.6 % — AB (ref 36.0–46.0)
HEMOGLOBIN: 8.8 g/dL — AB (ref 12.0–15.0)
MCH: 41.7 pg — ABNORMAL HIGH (ref 26.0–34.0)
MCHC: 33.1 g/dL (ref 30.0–36.0)
MCV: 126.1 fL — ABNORMAL HIGH (ref 78.0–100.0)
PLATELETS: 163 10*3/uL (ref 150–400)
RBC: 2.11 MIL/uL — ABNORMAL LOW (ref 3.87–5.11)
RDW: 17.2 % — ABNORMAL HIGH (ref 11.5–15.5)
WBC: 15.7 10*3/uL — AB (ref 4.0–10.5)

## 2015-07-11 LAB — PROTIME-INR
INR: 1.41 (ref 0.00–1.49)
PROTHROMBIN TIME: 17.3 s — AB (ref 11.6–15.2)

## 2015-07-11 LAB — COMPREHENSIVE METABOLIC PANEL
ALK PHOS: 96 U/L (ref 38–126)
ALT: 143 U/L — AB (ref 14–54)
AST: 239 U/L — AB (ref 15–41)
Albumin: 1.7 g/dL — ABNORMAL LOW (ref 3.5–5.0)
Anion gap: 3 — ABNORMAL LOW (ref 5–15)
BUN: 18 mg/dL (ref 6–20)
CO2: 26 mmol/L (ref 22–32)
Calcium: 7.7 mg/dL — ABNORMAL LOW (ref 8.9–10.3)
Chloride: 112 mmol/L — ABNORMAL HIGH (ref 101–111)
Creatinine, Ser: 0.41 mg/dL — ABNORMAL LOW (ref 0.44–1.00)
GFR calc Af Amer: >60 mL/min (ref >60)
Glucose, Bld: 114 mg/dL — ABNORMAL HIGH (ref 65–99)
Potassium: 4.4 mmol/L (ref 3.5–5.1)
Sodium: 141 mmol/L (ref 135–145)
Total Bilirubin: 9.6 mg/dL — ABNORMAL HIGH (ref 0.3–1.2)
Total Protein: 5.4 g/dL — ABNORMAL LOW (ref 6.5–8.1)

## 2015-07-11 LAB — FIBRINOGEN: Fibrinogen: 285 mg/dL (ref 204–475)

## 2015-07-11 LAB — MAGNESIUM: Magnesium: 2 mg/dL (ref 1.7–2.4)

## 2015-07-11 LAB — HEPARIN LEVEL (UNFRACTIONATED): HEPARIN UNFRACTIONATED: 0.45 [IU]/mL (ref 0.30–0.70)

## 2015-07-11 LAB — APTT: APTT: 164 s — AB (ref 24–37)

## 2015-07-11 LAB — PROCALCITONIN: PROCALCITONIN: 0.38 ng/mL

## 2015-07-11 MED ORDER — LACTULOSE 10 GM/15ML PO SOLN
30.0000 g | Freq: Two times a day (BID) | ORAL | Status: DC
  Administered 2015-07-11 – 2015-07-15 (×9): 30 g
  Filled 2015-07-11 (×10): qty 45

## 2015-07-11 NOTE — Progress Notes (Signed)
PULMONARY / CRITICAL CARE MEDICINE   Name: Theresa [name] MRN: [id number] DOB: 16-Jun-1946    ADMISSION DATE:  07/03/2015 CONSULTATION DATE:  07/04/15  REFERRING MD :  Dr. Clementeen Graham   CHIEF COMPLAINT:  Respiratory Failure   INITIAL PRESENTATION: 69 yo female presented with weakness and confusion after being tx for UTI as outpt.  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  Admitted with hepatic encephalopathy, VDRF, and ETOH withdrawal.  STUDIES:  7/26 CT head >> no ICH, mass or acute infarct 7/31 CT head: no acute findings 7/31 LUE [place]: superficial thrombus; begins AC end extends into shoulder.  8/2 EEG>>>  SIGNIFICANT EVENTS: 7/26  Admit with altered mental status, hepatic encephalopathy.  Recent UTI  7/27  Decompensation requiring intubation, PCCM consulted  7/27 steroids started for ETOH hepatitis  7/31 left UE swelling. Large superficial thrombus in arm of PICC 8/1 weaned all day. PICC replaced and left PICC removed. Heparin resumed.  8/2 weaning. Localizing to noxious stim. EEG done   SUBJ - afebrile More awake  No obvious pain Loose stools +  VITAL SIGNS: Temp:  [97.4 F (36.3 C)-99.7 F (37.6 C)] 98.5 F (36.9 C) (08/03 0500) Pulse Rate:  [77-101] 89 (08/03 0806) Resp:  [12-26] 16 (08/03 0806) BP: (96-156)/(34-97) 115/73 mmHg (08/03 0806) SpO2:  [96 %-100 %] 100 % (08/03 0806) FiO2 (%):  [30 %] 30 % (08/03 0853) Weight:  [74.6 kg (164 lb 7.4 oz)] 74.6 kg (164 lb 7.4 oz) (08/03 0500) HEMODYNAMICS:   VENTILATOR SETTINGS: Vent Mode:  [-] CPAP;PSV FiO2 (%):  [30 %] 30 % Set Rate:  [15 bmp] 15 bmp Vt Set:  [500 mL] 500 mL PEEP:  [5 cmH20] 5 cmH20 Pressure Support:  [5 cmH20] 5 cmH20 Plateau Pressure:  [14 cmH20-17 cmH20] 15 cmH20   INTAKE / OUTPUT:  Intake/Output Summary (Last 24 hours) at 07/11/15 0916 Last data filed at 07/11/15 0600  Gross per 24 hour  Intake 2633.22 ml  Output   1655 ml  Net 978.22 ml    PHYSICAL  EXAMINATION: General: grimacing to pain, jaundice  Neuro: perrL 5 mm, a little more purposeful , grips with left hand, responds to name HEENT: jvd wnl PULM:  scattered rhonchi CV: s1 s2 RRR no r slight distant GI: soft, bs hypo, NT, nd , obese Extremities:diffuse anasarca and ecchymosis    LABS:  CBC  Recent Labs Lab 07/09/15 0535 07/10/15 0355 07/11/15 0440  WBC 15.9* 13.4* 15.7*  HGB 8.8* 8.1* 8.8*  HCT 26.3* 25.0* 26.6*  PLT 170 144* 163   Coag's  Recent Labs Lab 07/08/15 0415 07/09/15 1245 07/10/15 0355 07/11/15 0435  APTT 40*  --  164* 164*  INR 1.58* 1.53* 1.62* 1.41   BMET  Recent Labs Lab 07/09/15 2035 07/10/15 0355 07/11/15 0440  NA 142 145 141  K 4.7 3.9 4.4  CL 114* 115* 112*  CO2 24 25 26   BUN 20 21* 18  CREATININE <0.30* 0.42* 0.41*  GLUCOSE 157* 149* 114*   Electrolytes  Recent Labs Lab 07/08/15 0731  07/09/15 0535  07/09/15 2035 07/10/15 0355 07/11/15 0440  CALCIUM 7.4*  7.5*  < > 7.7*  7.6*  < > 7.9* 7.7* 7.7*  MG  --   --  1.9  --   --  1.9 2.0  PHOS 1.0*  --  1.8*  --   --  1.4*  --   < > = values in this interval  not displayed. Sepsis Markers  Recent Labs Lab 07/04/15 2040 07/05/15 0424 07/05/15 0440 07/06/15 0400  LATICACIDVEN 1.9  --  1.6  --   PROCALCITON 0.61 0.51  --  0.34   ABG  Recent Labs Lab 07/04/15 1323 07/04/15 1715 07/05/15 0500  PHART 7.269* 7.357 7.404  PCO2ART 51.9* 43.1 37.7  PO2ART 63.5* 93.6 111*   Liver Enzymes  Recent Labs Lab 07/08/15 0415  07/09/15 0535 07/10/15 0355 07/11/15 0440  AST 222*  --  243*  --  239*  ALT 90*  --  104*  --  143*  ALKPHOS 120  --  115  --  96  BILITOT 14.8*  --  12.1*  --  9.6*  ALBUMIN 1.6*  < > 1.5*  1.5* 1.5* 1.7*  < > = values in this interval not displayed.  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 Underlying ILD noted on CT 11/2014 MS prevents extubation.  Tolerates PSV of 5 cmH2O  P:   VAP prevention SBT as tolerates.  PRN CXR   CARDIOVASCULAR A:  Tachycardia resolved Hx HLD, SVT, PAD  P:  Continue to monitor on telemetry  RENAL A:   Urine output is appropriate for her weight- NO EVIDENCE ARF Hyperchloremia Hypophosphatemia P:   Avoid saline with cl rise Cont D5W Chem daily Replaced PO4, will recheck in am   GASTROINTESTINAL A:   ETOH  Abdominal Pain, Nausea / Vomiting - seen by GI Severe Protein Calorie Malnutrition  Hx Esophageal Stricture s/p Dilation (4/88/89)  Alcoholic hepatitis Hepatic steatosis Bili trending down   P:    Continue TF to goal Pepcid BID Thiamine, folate, MVI  Lactulose & rifaximin, reduced lactulose to bid Prednisone  For alcoholic hepatitis per GI day x28 ds , started 7/26   HEMATOLOGIC / ONCOLOGY  A:   Coagulopathy  Anemia  Chronic Anticoagulation for PE/DVT - one year ago Hx Infiltrating Ductal Carcinoma (2002) Superficial thrombus Left arm (PICC line) Slight hemoconcentration WBC on cbc  P:  Elevate LUE cont IV heparin  Trend cbc Transfuse per typical ICU protocol  INFECTIOUS A:   Possible UTI  Possible Bilateral LL Aspiration Pna UC 7/27 >> negative P:   Monitoring for fever Continuing to monitor daily leukocyte count with CBC Rocephin, start date 7/27>>> Will consider 7 days ad stop date, use pct todecide  ENDOCRINE A:   Hypoglycemia - resolved.  P:   CBG Q4   NEUROLOGIC A:   Hepatic Encephalopathy  ETOH Abuse / Withdrawal  At risk ICP elevation  CT head obtained 7/31 w/out acute findings.  P:   Await eeg results RASS goal: 0 Lactulose bid Xifaxan maintain Thiamine / folate / MVI IV NO  benzo Continuing daily sedation vacation  fent prn   Summary  - Improving mental status, main issue limiting extubation Will monitor for new infections and cont supportive care.   The patient is critically ill with multiple organ systems failure and requires high complexity decision making for assessment and support, frequent evaluation and titration of therapies, application of advanced monitoring technologies and extensive interpretation of multiple databases. Critical Care Time devoted to patient care services  described in this note independent of APP time is 32 minutes.    Kara Mead MD. [facility]. [place] [facility] Pager [phone] If no response call [phone]    07/11/2015, 9:16 AM

## 2015-07-11 NOTE — Progress Notes (Signed)
ANTICOAGULATION CONSULT NOTE - Follow up  Pharmacy Consult for IV Heparin Indication: VTE Treatment/Hx PE (Xarelto PTA)  Allergies  Allergen Reactions  . Ciprofloxacin Hives  . Sulfa Antibiotics Hives    Patient Measurements: Height: 5\' 2"  (157.5 cm) Weight: 164 lb 7.4 oz (74.6 kg) IBW/kg (Calculated) : 50.1 Heparin Dosing Weight: 57 kg  Vital Signs: Temp: 98.5 F (36.9 C) (08/03 0500) Temp Source: Axillary (08/03 0000) BP: 126/68 mmHg (08/03 0700) Pulse Rate: 97 (08/03 0700)  Labs:  Recent Labs  07/09/15 0535  07/09/15 1245 07/09/15 2035 07/10/15 0355  07/10/15 1120 07/10/15 1928 07/11/15 0435 07/11/15 0440  HGB 8.8*  --   --   --  8.1*  --   --   --   --  8.8*  HCT 26.3*  --   --   --  25.0*  --   --   --   --  26.6*  PLT 170  --   --   --  144*  --   --   --   --  163  APTT  --   --   --   --  164*  --   --   --  164*  --   LABPROT  --   --  18.4*  --  19.3*  --   --   --  17.3*  --   INR  --   --  1.53*  --  1.62*  --   --   --  1.41  --   HEPARINUNFRC  --   --   --  0.28*  --   < > 0.29* 0.41 0.45  --   CREATININE 0.50  0.52  < >  --  <0.30* 0.42*  --   --   --   --  0.41*  < > = values in this interval not displayed.  Estimated Creatinine Clearance: 62.8 mL/min (by C-G formula based on Cr of 0.41).  Assessment: 69 yr female with significant PMH including ETOH abuse, HTN, DVT & PE.Patient was on Xarelto 15mg  daily PTA with reported last dose on 7/25 @ 22:00.  Heparin drip was started while patient was NPO then later discontinued by CCM due to liver disease and since patient had completed treatment course for PE after 1 year.  On 7/31, heparin infusion was resumed after LUE [place] found large superficial thrombus in arm of PICC.  Heparin held 8/1 AM for IV Team to change PICC to right arm and resumed after placement.  Today, 07/11/2015:   Heparin level remains therapeutic at 0.45 on Heparin at 1200 units/hr.  Liver enzymes remain elevated (8/3)  Hgb 8.8 is  slightly improved, platelets 163K remain WNL.    CrCl~ 62 ml/min  No bleeding/line issues reported.  Goal of Therapy:  Heparin level 0.3-0.7 units/ml Monitor platelets by anticoagulation protocol: Yes   Plan:   Continue heparin IV infusion at 1200 units/hr.  Daily CBC and heparin level.  Monitor closely for s/s of bleeding.  Gretta Arab PharmD, BCPS Pager [phone] 07/11/2015 7:28 AM

## 2015-07-11 NOTE — Progress Notes (Signed)
NUTRITION NOTE  Call received from RN who states concern about residuals yesterday and today. She states that residuals <300cc yesterday evening and RN turned TF off for 4 hours and then restarted with slow advancement.  RN reports that for the past 2 residual checks residuals have been 180cc and TF seems to be "sitting" in pt's stomach rather than passing through digestive track. She states that Vital AF 1.2 currently running at 40 mL/hr (goal 55 mL/hr) due to concern for motility and aspiration risk.  RN states that no orders currently in place for prokinetic. RD recommends trial of prokinetic as Vital AF 1.2 is a peptide-based formula with only 5.1 grams fiber/L (with current goal rate, pt would receive 6.7 grams of fiber/24 hours). RN asks if TF should continue to be advanced to goal and RD advises to do so.   Will see pt tomorrow (8/4) for full follow-up.    Jarome Matin, RD, LDN Inpatient Clinical Dietitian Pager # [phone] After hours/weekend pager # [phone]

## 2015-07-12 ENCOUNTER — Inpatient Hospital Stay (HOSPITAL_COMMUNITY): Payer: Medicare Other

## 2015-07-12 LAB — GLUCOSE, CAPILLARY
GLUCOSE-CAPILLARY: 127 mg/dL — AB (ref 65–99)
GLUCOSE-CAPILLARY: 107 mg/dL — AB (ref 65–99)
GLUCOSE-CAPILLARY: 126 mg/dL — AB (ref 65–99)
Glucose-Capillary: 117 mg/dL — ABNORMAL HIGH (ref 65–99)
Glucose-Capillary: 122 mg/dL — ABNORMAL HIGH (ref 65–99)
Glucose-Capillary: 123 mg/dL — ABNORMAL HIGH (ref 65–99)
Glucose-Capillary: 141 mg/dL — ABNORMAL HIGH (ref 65–99)

## 2015-07-12 LAB — RENAL FUNCTION PANEL
Albumin: 1.7 g/dL — ABNORMAL LOW (ref 3.5–5.0)
Anion gap: 4 — ABNORMAL LOW (ref 5–15)
BUN: 15 mg/dL (ref 6–20)
CHLORIDE: 106 mmol/L (ref 101–111)
CO2: 24 mmol/L (ref 22–32)
CREATININE: 0.37 mg/dL — AB (ref 0.44–1.00)
Calcium: 7.5 mg/dL — ABNORMAL LOW (ref 8.9–10.3)
GFR calc Af Amer: >60 mL/min (ref >60)
Glucose, Bld: 230 mg/dL — ABNORMAL HIGH (ref 65–99)
POTASSIUM: 4 mmol/L (ref 3.5–5.1)
Phosphorus: 2.2 mg/dL — ABNORMAL LOW (ref 2.5–4.6)
SODIUM: 134 mmol/L — AB (ref 135–145)

## 2015-07-12 LAB — FIBRINOGEN: Fibrinogen: 327 mg/dL (ref 204–475)

## 2015-07-12 LAB — CBC
HCT: 25.3 % — ABNORMAL LOW (ref 36.0–46.0)
Hemoglobin: 8 g/dL — ABNORMAL LOW (ref 12.0–15.0)
MCH: 39.6 pg — ABNORMAL HIGH (ref 26.0–34.0)
MCHC: 31.6 g/dL (ref 30.0–36.0)
MCV: 125.2 fL — AB (ref 78.0–100.0)
Platelets: 148 10*3/uL — ABNORMAL LOW (ref 150–400)
RBC: 2.02 MIL/uL — AB (ref 3.87–5.11)
RDW: 16.5 % — AB (ref 11.5–15.5)
WBC: 13.3 10*3/uL — ABNORMAL HIGH (ref 4.0–10.5)

## 2015-07-12 LAB — APTT: aPTT: 155 seconds — ABNORMAL HIGH (ref 24–37)

## 2015-07-12 LAB — HEPARIN LEVEL (UNFRACTIONATED): Heparin Unfractionated: 0.42 IU/mL (ref 0.30–0.70)

## 2015-07-12 LAB — PROTIME-INR
INR: 1.45 (ref 0.00–1.49)
PROTHROMBIN TIME: 17.7 s — AB (ref 11.6–15.2)

## 2015-07-12 LAB — PROCALCITONIN: PROCALCITONIN: 0.38 ng/mL

## 2015-07-12 LAB — MAGNESIUM: MAGNESIUM: 1.8 mg/dL (ref 1.7–2.4)

## 2015-07-12 MED ORDER — SODIUM PHOSPHATE 3 MMOLE/ML IV SOLN
30.0000 mmol | Freq: Once | INTRAVENOUS | Status: AC
  Administered 2015-07-12: 30 mmol via INTRAVENOUS
  Filled 2015-07-12: qty 10

## 2015-07-12 MED ORDER — PRO-STAT SUGAR FREE PO LIQD
30.0000 mL | Freq: Two times a day (BID) | ORAL | Status: DC
  Administered 2015-07-12 – 2015-07-14 (×6): 30 mL
  Filled 2015-07-12 (×7): qty 30

## 2015-07-12 MED ORDER — PRO-STAT SUGAR FREE PO LIQD: 30.0000 mL | Freq: Every day | ORAL | Status: DC

## 2015-07-12 MED ORDER — VITAL AF 1.2 CAL PO LIQD
1000.0000 mL | ORAL | Status: DC
  Administered 2015-07-12 – 2015-07-13 (×2): 1000 mL
  Filled 2015-07-12 (×5): qty 1000

## 2015-07-12 NOTE — Care Management Note (Signed)
Case Management Note  Patient Details  Name: Theresa [name] MRN: [id number] Date of Birth: February 01, 1946  Subjective/Objective:                    Action/Plan:   Expected Discharge Date:   (unknown)               Expected Discharge Plan:  Home/Self Care  In-House Referral:  Clinical Social Work  Discharge planning Services  CM Consult  Post Acute Care Choice:  NA Choice offered to:  NA  DME Arranged:  N/A DME Agency:  NA  HH Arranged:  NA HH Agency:  NA  Status of Service:  In process, will continue to follow  Medicare Important Message Given:  Yes-third notification given Date Medicare IM Given:    Medicare IM give by:    Date Additional Medicare IM Given:    Additional Medicare Important Message give by:     If discussed at [place] of Stay Meetings, dates discussed:  [id number]  Additional Comments:  Leeroy Cha, RN 07/12/2015, 10:36 AM

## 2015-07-12 NOTE — Progress Notes (Signed)
Date:  July 12, 2015 U.R. performed for needs and level of care. Will continue to follow for Case Management needs.  Ansel Ferrall, RN, BSN, CCM   [phone] 

## 2015-07-12 NOTE — Progress Notes (Signed)
[facility] PULMONARY / CRITICAL CARE MEDICINE   Name: Theresa [name] MRN: [id number] DOB: 1946-11-27    ADMISSION DATE:  07/03/2015 CONSULTATION DATE:  07/04/15  REFERRING MD :  Dr. Clementeen Graham   CHIEF COMPLAINT:  Respiratory Failure   INITIAL PRESENTATION: 69 yo female presented with weakness and confusion after being tx for UTI as outpt.  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  Admitted with hepatic encephalopathy, VDRF, and ETOH withdrawal.  STUDIES:  7/26 CT head >> no ICH, mass or acute infarct 7/31 CT head: no acute findings 7/31 LUE [place]: superficial thrombus; begins AC end extends into shoulder.  8/2 EEG>>>  SIGNIFICANT EVENTS: 7/26  Admit with altered mental status, hepatic encephalopathy.  Recent UTI  7/27  Decompensation requiring intubation, PCCM consulted  7/27 steroids started for ETOH hepatitis  7/31 left UE swelling. Large superficial thrombus in arm of PICC 8/1 weaned all day. PICC replaced and left PICC removed. Heparin resumed.  8/2 weaning. Localizing to noxious stim. EEG done 8/4 more awake, following some commands EEG still pending   SUBJ -  More awake.   VITAL SIGNS: Temp:  [97.8 F (36.6 C)-99.3 F (37.4 C)] 97.8 F (36.6 C) (08/04 0800) Pulse Rate:  [95-107] 107 (08/04 0500) Resp:  [15-24] 18 (08/04 0500) BP: (85-138)/(27-80) 138/55 mmHg (08/04 0623) SpO2:  [93 %-100 %] 93 % (08/04 0500) FiO2 (%):  [30 %] 30 % (08/04 0755) Weight:  [75.5 kg (166 lb 7.2 oz)] 75.5 kg (166 lb 7.2 oz) (08/04 0400) HEMODYNAMICS:   VENTILATOR SETTINGS: Vent Mode:  [-] CPAP;PSV FiO2 (%):  [30 %] 30 % Set Rate:  [15 bmp] 15 bmp Vt Set:  [500 mL] 500 mL PEEP:  [5 cmH20] 5 cmH20 Pressure Support:  [5 cmH20-8 cmH20] 5 cmH20 Plateau Pressure:  [14 cmH20-16 cmH20] 16 cmH20   INTAKE / OUTPUT:  Intake/Output Summary (Last 24 hours) at 07/12/15 0901 Last data filed at 07/12/15 0700  Gross per 24 hour  Intake 2834.5 ml  Output   2165 ml  Net   669.5 ml    PHYSICAL EXAMINATION: General: grimacing to pain, jaundice, more responsive Neuro: perrL 5 mm, a little more purposeful ,now follows commands HEENT: jvd wnl PULM:  scattered rhonchi CV: s1 s2 RRR no r slight distant GI: soft, bs hypo, NT, nd , obese Extremities:diffuse anasarca and ecchymosis    LABS:  CBC  Recent Labs Lab 07/10/15 0355 07/11/15 0440 07/12/15 0522  WBC 13.4* 15.7* 13.3*  HGB 8.1* 8.8* 8.0*  HCT 25.0* 26.6* 25.3*  PLT 144* 163 148*   Coag's  Recent Labs Lab 07/10/15 0355 07/11/15 0435 07/12/15 0625  APTT 164* 164* 155*  INR 1.62* 1.41 1.45   BMET  Recent Labs Lab 07/10/15 0355 07/11/15 0440 07/12/15 0522  NA 145 141 134*  K 3.9 4.4 4.0  CL 115* 112* 106  CO2 25 26 24   BUN 21* 18 15  CREATININE 0.42* 0.41* 0.37*  GLUCOSE 149* 114* 230*   Electrolytes  Recent Labs Lab 07/09/15 0535  07/10/15 0355 07/11/15 0440 07/12/15 0522  CALCIUM 7.7*  7.6*  < > 7.7* 7.7* 7.5*  MG 1.9  --  1.9 2.0 1.8  PHOS 1.8*  --  1.4*  --  2.2*  < > = values in this interval not displayed. Sepsis Markers  Recent Labs Lab 07/06/15 0400 07/11/15 1031 07/12/15 0522  PROCALCITON 0.34 0.38 0.38   ABG No results for input(s): PHART,  PCO2ART, PO2ART in the last 168 hours. Liver Enzymes  Recent Labs Lab 07/08/15 0415  07/09/15 0535 07/10/15 0355 07/11/15 0440 07/12/15 0522  AST 222*  --  243*  --  239*  --   ALT 90*  --  104*  --  143*  --   ALKPHOS 120  --  115  --  96  --   BILITOT 14.8*  --  12.1*  --  9.6*  --   ALBUMIN 1.6*  < > 1.5*  1.5* 1.5* 1.7* 1.7*  < > = values in this interval not displayed.  Glucose  Recent Labs Lab 07/11/15 1218 07/11/15 1559 07/11/15 1958 07/12/15 0026 07/12/15 0406 07/12/15 0728  GLUCAP 99 126* 122* 123* 127* 117*    Imaging Dg Chest Port 1 View  07/12/2015   CLINICAL DATA:  Respiratory failure.  EXAM: PORTABLE CHEST - 1 VIEW  COMPARISON:  07/10/2015 .  FINDINGS: Endotracheal tube, NG  tube, right PICC line in stable position. Mediastinum and hilar structures are stable. Low lung volumes with mild bibasilar atelectasis and/or infiltrates. No pleural effusion or pneumothorax. Heart size normal .  IMPRESSION: 1. Lines and tubes in stable position. 2. Low lung volumes with persistent subsegmental atelectasis. Persistent mild left base infiltrate.   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 Underlying ILD noted on CT 11/2014 MS prevents extubation.  Tolerates PSV of 5 cmH2O  P:   VAP prevention SBT as tolerates.  PRN CXR   CARDIOVASCULAR A:  Tachycardia resolved Hx HLD, SVT, PAD  P:  Continue to monitor on telemetry  RENAL A:   Urine output is appropriate for her weight- NO EVIDENCE ARF Hyperchloremia Hypophosphatemia P:   Avoid saline with cl rise kvo IVF Chem daily Replaced PO4, will recheck in am   GASTROINTESTINAL A:   ETOH  Abdominal Pain, Nausea / Vomiting - seen by GI Severe Protein Calorie Malnutrition  Hx Esophageal Stricture s/p Dilation (12/09/09)  Alcoholic hepatitis Hepatic steatosis Bili trending down   P:    Continue TF to goal Pepcid BID Thiamine, folate, MVI  Lactulose & rifaximin, reduced lactulose to bid Prednisone  For alcoholic hepatitis per GI day x28 ds , started 7/26   HEMATOLOGIC / ONCOLOGY  A:   Coagulopathy  Anemia  Chronic Anticoagulation for PE/DVT - one year ago Hx Infiltrating Ductal Carcinoma (2002) Superficial thrombus Left arm (PICC line) Slight hemoconcentration WBC on cbc  P:  Elevate LUE cont IV heparin  Trend cbc Transfuse per typical ICU protocol  INFECTIOUS A:   Possible UTI  Possible Bilateral LL Aspiration Pna UC 7/27 >> negative P:   Monitoring for fever Continuing to  monitor daily leukocyte count with CBC Rocephin, start date 7/27>>>8/2 Will consider 7 days ad stop date, use pct todecide  ENDOCRINE A:   Hypoglycemia - resolved.  P:   CBG Q4   NEUROLOGIC A:   Hepatic Encephalopathy  ETOH Abuse / Withdrawal  At risk ICP elevation  CT head obtained 7/31 w/out acute findings.  P:   Await eeg results RASS goal: 0 Lactulose bid Xifaxan maintain Thiamine / folate / MVI IV NO benzo Continuing daily sedation vacation  fent prn   Summary  - Improving mental status, main issue limiting extubation  Will monitor for new infections and cont supportive care. Hope that we can support her over the weekend and possibly extubate soon after if MS cont to improve.      07/12/2015, 9:01 AM

## 2015-07-12 NOTE — Care Management Important Message (Signed)
Important Message  Patient Details  Name: LAURIANN MILILLO MRN: [id number] Date of Birth: 1946-01-10   Medicare Important Message Given:  Yes-fourth notification given    Camillo Flaming 07/12/2015, 1:33 [place] Message  Patient Details  Name: TANNYA GONET MRN: [id number] Date of Birth: 07-27-1946   Medicare Important Message Given:  Yes-fourth notification given    Camillo Flaming 07/12/2015, 1:33 PM

## 2015-07-12 NOTE — Procedures (Signed)
History: 69 yo F with altered mental status   Technique: This is a 19 channel routine scalp EEG performed at the bedside with bipolar and monopolar montages arranged in accordance to the international 10/20 system of electrode placement. One channel was dedicated to EKG recording.    Background: The background consists of irregular diffuse delta with some intermixed theta at times as well. There are occasional bifrontally predominant discharges with triphasic morphology and an AP lag. No clear posterior rhythm is seen.    Photic stimulation: Physiologic driving is not performed  EEG Abnormalities: 1) Triphasic waves 2) Generalized irregular slow activity 3) absent PDR  Clinical Interpretation: This EEG is consistent with a generalized non-specific cerebral dysfunction(encephalopathy). There was no seizure or seizure predisposition recorded on this study.   Roland Rack, MD [facility] [phone]  If 7pm- 7am, please page neurology on call as listed in [place].

## 2015-07-12 NOTE — Progress Notes (Addendum)
Nutrition Follow-up  DOCUMENTATION CODES:   Non-severe (moderate) malnutrition in context of chronic illness  INTERVENTION:   If patient has residuals >500 ml, consider addition of prokinetic agent (Reglan). Decrease goal to Vital AF 1.2 @ 45 ml/hr which will meet new estimated needs with addition of 30 ml Prostat BID. TF regimen provides 1496 kcal (100% of needs), 111 grams of protein, and 876 ml of H2O.  RD to continue to monitor   NUTRITION DIAGNOSIS:   Inadequate oral intake related to inability to eat as evidenced by NPO status.  Ongoing.  GOAL:   Patient will meet greater than or equal to 90% of their needs  Meeting.  MONITOR:   Vent status, Labs, Weight trends, TF tolerance, Skin, I & O's  ASSESSMENT:   69 y.o. 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 Pt noted to ingest large amounts of ETOH on a daily basis. Pt has been recently trying to cut back ETOH intake but continues to drink. In the ED, alk phos noted to be elevated at 147 with ast of 287 and alt of 98 and total bili of 18.6. Ammonia noted to be 47. Head CT was unremarkable. Pt was started on CIWA and hospitalist consulted for admission.  Pt with residuals ranging from 0-350 ml over the past day. If residuals increase to >500 ml then need to consider adding prokinetic agent.  Patient is currently intubated on ventilator support MV: 11.1 L/min Temp (24hrs), Avg:98.5 F (36.9 C), Min:97.8 F (36.6 C), Max:99.3 F (37.4 C)  Propofol: none  Re-estimated needs d/t change in vent settings.   Labs reviewed: Low Na, Creatinine, Phos Mg WNL  Diet Order:  Diet NPO time specified  Skin:  Wound (see comment) (Stage II buttocks ulcer)  Last BM:  8/3  Height:   Ht Readings from Last 1 Encounters:  07/05/15 _0  (1.575 m)    Weight:   Wt Readings from Last 1 Encounters:  07/12/15 166 lb 7.2  oz (75.5 kg)    Ideal Body Weight:  50 kg  BMI:  Body mass index is 30.44 kg/(m^2).  Estimated Nutritional Needs:   Kcal:  1432   Protein:  105-115g  Fluid:  1.5L/day  EDUCATION NEEDS:   No education needs identified at this time  Clayton Bibles, MS, RD, LDN Pager: [phone] After Hours Pager: [phone]

## 2015-07-12 NOTE — Progress Notes (Signed)
ANTICOAGULATION CONSULT NOTE - Follow up  Pharmacy Consult for IV Heparin Indication: VTE Treatment/Hx PE (Xarelto PTA)  Allergies  Allergen Reactions  . Ciprofloxacin Hives  . Sulfa Antibiotics Hives    Patient Measurements: Height: 5\' 2"  (157.5 cm) Weight: 166 lb 7.2 oz (75.5 kg) IBW/kg (Calculated) : 50.1 Heparin Dosing Weight: 57 kg  Vital Signs: Temp: 99.3 F (37.4 C) (08/04 0400) Temp Source: Axillary (08/04 0400) BP: 105/66 mmHg (08/04 0335) Pulse Rate: 103 (08/04 0335)  Labs:  Recent Labs  07/10/15 0355  07/10/15 1928 07/11/15 0435 07/11/15 0440 07/12/15 0522 07/12/15 0625  HGB 8.1*  --   --   --  8.8* 8.0*  --   HCT 25.0*  --   --   --  26.6* 25.3*  --   PLT 144*  --   --   --  163 148*  --   APTT 164*  --   --  164*  --   --  155*  LABPROT 19.3*  --   --  17.3*  --   --  17.7*  INR 1.62*  --   --  1.41  --   --  1.45  HEPARINUNFRC  --   < > 0.41 0.45  --   --  0.42  CREATININE 0.42*  --   --   --  0.41* 0.37*  --   < > = values in this interval not displayed.  Estimated Creatinine Clearance: 63.2 mL/min (by C-G formula based on Cr of 0.37).  Assessment: 69 yr female with significant PMH including ETOH abuse, HTN, DVT & PE.Patient was on Xarelto 15mg  daily PTA with reported last dose on 7/25 @ 22:00.  Heparin drip was started while patient was NPO then later discontinued by CCM due to liver disease and since patient had completed treatment course for PE after 1 year.  On 7/31, heparin infusion was resumed after LUE [place] found large superficial thrombus in arm of PICC.  Heparin held 8/1 AM for IV Team to change PICC to right arm and resumed after placement.  Today, 07/12/2015:   Heparin level remains therapeutic at 0.42 on Heparin at 1200 units/hr.  Liver enzymes remain elevated (8/3)  Hgb 8, slightly decreased, platelets 148k are decreased.    No bleeding/line issues reported.  CrCl~ 63 ml/min   Goal of Therapy:  Heparin level 0.3-0.7  units/ml Monitor platelets by anticoagulation protocol: Yes   Plan:   Continue heparin IV infusion at 1200 units/hr.  Daily CBC and heparin level.  Monitor closely for s/s of bleeding.  Gretta Arab PharmD, BCPS Pager [phone] 07/12/2015 7:07 AM

## 2015-07-13 LAB — CBC
HCT: 26.1 % — ABNORMAL LOW (ref 36.0–46.0)
Hemoglobin: 8.6 g/dL — ABNORMAL LOW (ref 12.0–15.0)
MCH: 41.1 pg — AB (ref 26.0–34.0)
MCHC: 33 g/dL (ref 30.0–36.0)
MCV: 124.9 fL — AB (ref 78.0–100.0)
PLATELETS: 166 10*3/uL (ref 150–400)
RBC: 2.09 MIL/uL — ABNORMAL LOW (ref 3.87–5.11)
RDW: 16.3 % — ABNORMAL HIGH (ref 11.5–15.5)
WBC: 16.9 10*3/uL — ABNORMAL HIGH (ref 4.0–10.5)

## 2015-07-13 LAB — APTT: APTT: 138 s — AB (ref 24–37)

## 2015-07-13 LAB — FIBRINOGEN: FIBRINOGEN: 345 mg/dL (ref 204–475)

## 2015-07-13 LAB — COMPREHENSIVE METABOLIC PANEL
ALBUMIN: 1.8 g/dL — AB (ref 3.5–5.0)
ALK PHOS: 105 U/L (ref 38–126)
ALT: 188 U/L — ABNORMAL HIGH (ref 14–54)
AST: 249 U/L — ABNORMAL HIGH (ref 15–41)
Anion gap: 5 (ref 5–15)
BUN: 19 mg/dL (ref 6–20)
CO2: 24 mmol/L (ref 22–32)
Calcium: 7.8 mg/dL — ABNORMAL LOW (ref 8.9–10.3)
Chloride: 109 mmol/L (ref 101–111)
Creatinine, Ser: 0.42 mg/dL — ABNORMAL LOW (ref 0.44–1.00)
GFR calc Af Amer: >60 mL/min (ref >60)
GFR calc non Af Amer: >60 mL/min (ref >60)
Glucose, Bld: 105 mg/dL — ABNORMAL HIGH (ref 65–99)
Potassium: 4 mmol/L (ref 3.5–5.1)
Sodium: 138 mmol/L (ref 135–145)
Total Bilirubin: 9.5 mg/dL — ABNORMAL HIGH (ref 0.3–1.2)
Total Protein: 5.4 g/dL — ABNORMAL LOW (ref 6.5–8.1)

## 2015-07-13 LAB — GLUCOSE, CAPILLARY
GLUCOSE-CAPILLARY: 98 mg/dL (ref 65–99)
GLUCOSE-CAPILLARY: 108 mg/dL — AB (ref 65–99)
Glucose-Capillary: 129 mg/dL — ABNORMAL HIGH (ref 65–99)
Glucose-Capillary: 98 mg/dL (ref 65–99)
Glucose-Capillary: 155 mg/dL — ABNORMAL HIGH (ref 65–99)
Glucose-Capillary: 143 mg/dL — ABNORMAL HIGH (ref 65–99)

## 2015-07-13 LAB — PROCALCITONIN: Procalcitonin: 0.5 ng/mL

## 2015-07-13 LAB — PROTIME-INR
INR: 1.41 (ref 0.00–1.49)
Prothrombin Time: 17.4 seconds — ABNORMAL HIGH (ref 11.6–15.2)

## 2015-07-13 LAB — PHOSPHORUS: PHOSPHORUS: 3.1 mg/dL (ref 2.5–4.6)

## 2015-07-13 LAB — HEPARIN LEVEL (UNFRACTIONATED): Heparin Unfractionated: 0.35 IU/mL (ref 0.30–0.70)

## 2015-07-13 LAB — AMMONIA: Ammonia: 30 umol/L (ref 9–35)

## 2015-07-13 LAB — MAGNESIUM: Magnesium: 1.9 mg/dL (ref 1.7–2.4)

## 2015-07-13 MED ORDER — FENTANYL CITRATE (PF) 100 MCG/2ML IJ SOLN
25.0000 ug | INTRAMUSCULAR | Status: DC | PRN
  Administered 2015-07-13 (×2): 25 ug via INTRAVENOUS
  Administered 2015-07-13 (×3): 50 ug via INTRAVENOUS
  Administered 2015-07-13: 25 ug via INTRAVENOUS
  Administered 2015-07-14: 50 ug via INTRAVENOUS
  Administered 2015-07-14 (×3): 25 ug via INTRAVENOUS
  Administered 2015-07-14: 50 ug via INTRAVENOUS
  Administered 2015-07-14 (×6): 25 ug via INTRAVENOUS
  Administered 2015-07-14: 50 ug via INTRAVENOUS
  Administered 2015-07-14 – 2015-07-15 (×2): 25 ug via INTRAVENOUS
  Filled 2015-07-13 (×20): qty 2

## 2015-07-13 MED ORDER — ENOXAPARIN SODIUM 80 MG/0.8ML ~~LOC~~ SOLN
1.0000 mg/kg | Freq: Two times a day (BID) | SUBCUTANEOUS | Status: DC
  Administered 2015-07-13 – 2015-07-15 (×6): 75 mg via SUBCUTANEOUS
  Filled 2015-07-13 (×10): qty 0.8

## 2015-07-13 MED ORDER — SODIUM CHLORIDE 0.9 % IV BOLUS (SEPSIS)
500.0000 mL | Freq: Once | INTRAVENOUS | Status: AC
  Administered 2015-07-13: 500 mL via INTRAVENOUS

## 2015-07-13 MED ORDER — DEXMEDETOMIDINE HCL IN NACL 200 MCG/50ML IV SOLN
0.4000 ug/kg/h | INTRAVENOUS | Status: DC
  Administered 2015-07-13: 0.4 ug/kg/h via INTRAVENOUS
  Administered 2015-07-13: 0.5 ug/kg/h via INTRAVENOUS
  Filled 2015-07-13 (×3): qty 50

## 2015-07-13 NOTE — Progress Notes (Signed)
[place] Progress Note Patient Name: Theresa [name] DOB: 1946-08-20 MRN: [id number]   Date of Service  07/13/2015  HPI/Events of Note  BP low.  eICU Interventions  Will give fluid bolus.     Intervention Category Major Interventions: Other:  Gedalia Mcmillon 07/13/2015, 10:04 PM

## 2015-07-13 NOTE — Progress Notes (Addendum)
ANTICOAGULATION CONSULT NOTE - Follow up  Pharmacy Consult for IV Heparin --> SQ Enoxaparin Indication: VTE Treatment/Hx PE (Xarelto PTA)  Allergies  Allergen Reactions  . Ciprofloxacin Hives  . Sulfa Antibiotics Hives    Patient Measurements: Height: 5\' 2"  (157.5 cm) Weight: 165 lb 5.5 oz (75 kg) IBW/kg (Calculated) : 50.1 Heparin Dosing Weight: 57 kg  Vital Signs: Temp: 97.8 F (36.6 C) (08/05 0347) Temp Source: Axillary (08/05 0347) BP: 126/97 mmHg (08/05 0600) Pulse Rate: 104 (08/05 0600)  Labs:  Recent Labs  07/11/15 0435  07/11/15 0440 07/12/15 0522 07/12/15 0625 07/13/15 0345 07/13/15 0519  HGB  --   < > 8.8* 8.0*  --   --  8.6*  HCT  --   --  26.6* 25.3*  --   --  26.1*  PLT  --   --  163 148*  --   --  166  APTT 164*  --   --   --  155* 138*  --   LABPROT 17.3*  --   --   --  17.7* 17.4*  --   INR 1.41  --   --   --  1.45 1.41  --   HEPARINUNFRC 0.45  --   --   --  0.42 0.35  --   CREATININE  --   --  0.41* 0.37*  --   --  0.42*  < > = values in this interval not displayed.  Estimated Creatinine Clearance: 63 mL/min (by C-G formula based on Cr of 0.42).  Assessment: 69 yr female with significant PMH including ETOH abuse, HTN, DVT & PE.Patient was on Xarelto 15mg  daily PTA with reported last dose on 7/25 @ 22:00.  Heparin drip was started while patient was NPO then later discontinued by CCM due to liver disease and since patient had completed treatment course for PE after 1 year.  On 7/31, heparin infusion was resumed after LUE [place] found large superficial thrombus in arm of PICC.  Heparin held 8/1 AM for IV Team to change PICC to right arm and resumed after placement.  Today, 07/13/2015:   Heparin level remains therapeutic at 0.35 on heparin infusion at 1200 units/hr.  Liver enzymes remain elevated  Hgb low, but stable at 8.6, Pltc WNL   No bleeding/line issues reported.  CrCl~ 63 ml/min  Goal of Therapy:  Heparin level 0.3-0.7 units/ml Monitor  platelets by anticoagulation protocol: Yes   Plan:   Continue heparin IV infusion at 1200 units/hr.  Will re-check heparin level at noon to confirm that it remains therapeutic, as heparin level has been trending down steadily over past 3 days.   Daily CBC and heparin level.  D/C daily aPTTs.  Monitor closely for s/s of bleeding.   Lindell Spar, PharmD, BCPS Pager: [phone] 07/13/2015 7:29 AM   Addendum:  New orders received from PCCM to convert patient from IV heparin to SQ enoxaparin.  Plan:  D/C heparin infusion now.  D/C heparin levels.  Start enoxaparin 1 mg/kg (75 mg ) SQ q12h 1 hour after heparin infusion discontinued.  Monitor CBC, renal function, and for s/s of bleeding.   Lindell Spar, PharmD, BCPS Pager: [phone] 07/13/2015 9:28 AM

## 2015-07-13 NOTE — Progress Notes (Signed)
[place] Progress Note Patient Name: KAREE FORGE DOB: 1946-02-13 MRN: [id number]   Date of Service  07/13/2015  HPI/Events of Note  Hypotension , on precedex gtt  eICU Interventions  Dc precedex  NS bolus - 1L     Intervention Category Intermediate Interventions: Hypotension - evaluation and management  Darlis Wragg V. 07/13/2015, 7:41 PM

## 2015-07-13 NOTE — Progress Notes (Signed)
PT BP just dropped to 69/36. PT on precedex drip at 0.4. Drip turned off and MD paged. MD said to continue to monitor patient, stop precedex, and give a 500 cc bolus.

## 2015-07-13 NOTE — Progress Notes (Signed)
pro PULMONARY / CRITICAL CARE MEDICINE   Name: Theresa [name] MRN: [id number] DOB: May 11, 1946    ADMISSION DATE:  07/03/2015 CONSULTATION DATE:  07/04/15  REFERRING MD :  Dr. Clementeen Graham   CHIEF COMPLAINT:  Respiratory Failure   INITIAL PRESENTATION: 69 yo female presented with weakness and confusion after being tx for UTI as outpt.  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  Admitted with hepatic encephalopathy, VDRF, and ETOH withdrawal.  STUDIES:  7/26 CT head >> no ICH, mass or acute infarct 7/31 CT head: no acute findings 7/31 LUE [place]: superficial thrombus; begins AC end extends into shoulder.  8/2 EEG>>>non-specific cerebral dysfunction  SIGNIFICANT EVENTS: 7/26  Admit with altered mental status, hepatic encephalopathy.  Recent UTI  7/27  Decompensation requiring intubation, PCCM consulted  7/27 steroids started for ETOH hepatitis  7/31 left UE swelling. Large superficial thrombus in arm of PICC 8/1 weaned all day. PICC replaced and left PICC removed. Heparin resumed.  8/2 weaning. Localizing to noxious stim. EEG done 8/4 more awake, following some commands EEG still pending   SUBJ - afebrile No obvious pain More awake.  Loose stools  VITAL SIGNS: Temp:  [97.8 F (36.6 C)-99.7 F (37.6 C)] 98.6 F (37 C) (08/05 0800) Pulse Rate:  [96-115] 104 (08/05 0600) Resp:  [16-23] 16 (08/05 0600) BP: (113-155)/(20-97) 126/97 mmHg (08/05 0600) SpO2:  [96 %-100 %] 100 % (08/05 0600) FiO2 (%):  [30 %] 30 % (08/05 0724) Weight:  [165 lb 5.5 oz (75 kg)] 165 lb 5.5 oz (75 kg) (08/05 0600) HEMODYNAMICS:   VENTILATOR SETTINGS: Vent Mode:  [-] PSV FiO2 (%):  [30 %] 30 % Set Rate:  [15 bmp] 15 bmp Vt Set:  [500 mL] 500 mL PEEP:  [5 cmH20] 5 cmH20 Pressure Support:  [5 cmH20-8 cmH20] 5 cmH20 Plateau Pressure:  [14 cmH20-21 cmH20] 21 cmH20   INTAKE / OUTPUT:  Intake/Output Summary (Last 24 hours) at 07/13/15 0857 Last data filed at 07/13/15 0600  Gross per 24 hour  Intake 1844.5 ml  Output   1810 ml  Net   34.5 ml    PHYSICAL EXAMINATION: General: chr ill , jaundice, more responsive Neuro: perrL 5 mm, a little more purposeful , follows commands HEENT: jvd wnl PULM:  scattered rhonchi CV: s1 s2 RRR no r slight distant GI: soft, bs hypo, NT, nd , obese Extremities:diffuse anasarca and ecchymosis    LABS:  CBC  Recent Labs Lab 07/11/15 0440 07/12/15 0522 07/13/15 0519  WBC 15.7* 13.3* 16.9*  HGB 8.8* 8.0* 8.6*  HCT 26.6* 25.3* 26.1*  PLT 163 148* 166   Coag's  Recent Labs Lab 07/11/15 0435 07/12/15 0625 07/13/15 0345  APTT 164* 155* 138*  INR 1.41 1.45 1.41   BMET  Recent Labs Lab 07/11/15 0440 07/12/15 0522 07/13/15 0519  NA 141 134* 138  K 4.4 4.0 4.0  CL 112* 106 109  CO2 26 24 24   BUN 18 15 19   CREATININE 0.41* 0.37* 0.42*  GLUCOSE 114* 230* 105*   Electrolytes  Recent Labs Lab 07/10/15 0355 07/11/15 0440 07/12/15 0522 07/13/15 0519  CALCIUM 7.7* 7.7* 7.5* 7.8*  MG 1.9 2.0 1.8 1.9  PHOS 1.4*  --  2.2* 3.1   Sepsis Markers  Recent Labs Lab 07/11/15 1031 07/12/15 0522 07/13/15 0519  PROCALCITON 0.38 0.38 0.50   ABG No results for input(s): PHART, PCO2ART, PO2ART in the last 168 hours. Liver Enzymes  Recent Labs Lab  07/09/15 0535  07/11/15 0440 07/12/15 0522 07/13/15 0519  AST 243*  --  239*  --  249*  ALT 104*  --  143*  --  188*  ALKPHOS 115  --  96  --  105  BILITOT 12.1*  --  9.6*  --  9.5*  ALBUMIN 1.5*  1.5*  < > 1.7* 1.7* 1.8*  < > = values in this interval not displayed.  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 Underlying  ILD noted on CT 11/2014 Tolerates PSV of 5 cmH2O  P:   VAP prevention SBT as tolerates -getting closer to extubation - hope in 24h when more awake consitently PRN CXR   CARDIOVASCULAR A:  Tachycardia resolved Hx HLD, SVT, PAD  P:  Continue to monitor on telemetry  RENAL A:   Urine output is appropriate for her weight- NO EVIDENCE ARF Hyperchloremia Hypophosphatemia P:   Avoid saline with cl rise kvo IVF Chem daily  recheck mg,phos  in am   GASTROINTESTINAL A:   ETOH  Abdominal Pain, Nausea / Vomiting - seen by GI Severe Protein Calorie Malnutrition  Hx Esophageal Stricture s/p Dilation (0/34/74)  Alcoholic hepatitis Hepatic steatosis Bili plateau -ing   P:    Continue TF to goal Pepcid BID Thiamine, folate, MVI  Lactulose & rifaximin, reduced lactulose to bid Prednisone  For alcoholic hepatitis per GI day x28 ds , started 7/26   HEMATOLOGIC / ONCOLOGY  A:   Coagulopathy  Anemia  Chronic Anticoagulation for PE/DVT - one year ago Hx Infiltrating Ductal Carcinoma (2002) Superficial thrombus Left arm (PICC line) Slight hemoconcentration WBC on cbc  P:  Elevate LUE dc IV heparin - use lovenox  Trend cbc Transfuse per typical ICU protocol  INFECTIOUS A:   Possible UTI  Possible Bilateral LL Aspiration Pna UC 7/27 >> negative P:   Monitoring for fever Continuing to monitor daily leukocyte count with CBC Rocephin, start date 7/27>>>8/2   ENDOCRINE A:   Hypoglycemia - resolved.  P:   CBG Q4   NEUROLOGIC A:   Hepatic Encephalopathy  ETOH Abuse / Withdrawal  At risk ICP elevation  CT head obtained 7/31 w/out acute findings.  P:   RASS goal: 0 Lactulose bid Xifaxan maintain Thiamine / folate / MVI IV NO benzo fent prn   Updates - husband daily  Summary  - Improving mental status, main issue limiting extubation Will monitor for new infections and cont supportive care. Hope to extubate soon  if MS cont to improve.    The patient is  critically ill with multiple organ systems failure and requires high complexity decision making for assessment and support, frequent evaluation and titration of therapies, application of advanced monitoring technologies and extensive interpretation of multiple databases. Critical Care Time devoted to patient care services described in this note independent of APP time is 32 minutes.   Kara Mead MD. [facility]. [place] [facility] Pager [phone] If no response call [phone]    07/13/2015, 8:57 AM

## 2015-07-14 ENCOUNTER — Inpatient Hospital Stay (HOSPITAL_COMMUNITY): Payer: Medicare Other

## 2015-07-14 LAB — PHOSPHORUS: Phosphorus: 2.6 mg/dL (ref 2.5–4.6)

## 2015-07-14 LAB — RENAL FUNCTION PANEL
ANION GAP: 4 — AB (ref 5–15)
Albumin: 1.8 g/dL — ABNORMAL LOW (ref 3.5–5.0)
BUN: 21 mg/dL — AB (ref 6–20)
CALCIUM: 7.8 mg/dL — AB (ref 8.9–10.3)
CO2: 24 mmol/L (ref 22–32)
Chloride: 114 mmol/L — ABNORMAL HIGH (ref 101–111)
Creatinine, Ser: 0.43 mg/dL — ABNORMAL LOW (ref 0.44–1.00)
GFR calc Af Amer: >60 mL/min (ref >60)
GFR calc non Af Amer: >60 mL/min (ref >60)
GLUCOSE: 118 mg/dL — AB (ref 65–99)
PHOSPHORUS: 2.6 mg/dL (ref 2.5–4.6)
Potassium: 4.1 mmol/L (ref 3.5–5.1)
Sodium: 142 mmol/L (ref 135–145)

## 2015-07-14 LAB — CBC
HCT: 22.3 % — ABNORMAL LOW (ref 36.0–46.0)
HEMOGLOBIN: 7.3 g/dL — AB (ref 12.0–15.0)
MCH: 40.6 pg — ABNORMAL HIGH (ref 26.0–34.0)
MCHC: 32.7 g/dL (ref 30.0–36.0)
MCV: 123.9 fL — ABNORMAL HIGH (ref 78.0–100.0)
PLATELETS: 146 10*3/uL — AB (ref 150–400)
RBC: 1.8 MIL/uL — AB (ref 3.87–5.11)
RDW: 15.7 % — ABNORMAL HIGH (ref 11.5–15.5)
WBC: 16.4 10*3/uL — ABNORMAL HIGH (ref 4.0–10.5)

## 2015-07-14 LAB — PROTIME-INR
INR: 1.39 (ref 0.00–1.49)
Prothrombin Time: 17.1 seconds — ABNORMAL HIGH (ref 11.6–15.2)

## 2015-07-14 LAB — GLUCOSE, CAPILLARY
GLUCOSE-CAPILLARY: 128 mg/dL — AB (ref 65–99)
GLUCOSE-CAPILLARY: 149 mg/dL — AB (ref 65–99)
GLUCOSE-CAPILLARY: 167 mg/dL — AB (ref 65–99)
Glucose-Capillary: 116 mg/dL — ABNORMAL HIGH (ref 65–99)
Glucose-Capillary: 106 mg/dL — ABNORMAL HIGH (ref 65–99)
Glucose-Capillary: 166 mg/dL — ABNORMAL HIGH (ref 65–99)

## 2015-07-14 MED ORDER — HALOPERIDOL LACTATE 5 MG/ML IJ SOLN
2.0000 mg | Freq: Four times a day (QID) | INTRAMUSCULAR | Status: DC | PRN
  Administered 2015-07-14 (×3): 2 mg via INTRAVENOUS
  Filled 2015-07-14 (×2): qty 1

## 2015-07-14 MED ORDER — DEXAMETHASONE SODIUM PHOSPHATE 4 MG/ML IJ SOLN
2.0000 mg | Freq: Four times a day (QID) | INTRAMUSCULAR | Status: AC
  Administered 2015-07-14 (×2): 2 mg via INTRAVENOUS
  Filled 2015-07-14 (×2): qty 1

## 2015-07-14 MED ORDER — HALOPERIDOL LACTATE 5 MG/ML IJ SOLN
INTRAMUSCULAR | Status: AC
  Filled 2015-07-14: qty 1

## 2015-07-14 NOTE — Progress Notes (Signed)
PCCM Attending: Patient had no cuff leak after 20 minutes on PS 0/5 FiO2 0.3. Decadron 2mg  IV x2doses. Repeat SBT AM.  Sonia Baller. Ashok Cordia, M.D. [place] [facility] Critical [facility] Pager:  [phone] After 3pm or if no response, call [phone]

## 2015-07-14 NOTE — Progress Notes (Signed)
pro PULMONARY / CRITICAL CARE MEDICINE   Name: Theresa [name] MRN: [id number] DOB: 1946/08/05    ADMISSION DATE:  07/03/2015 CONSULTATION DATE:  07/04/15  REFERRING MD :  Dr. Clementeen Graham   CHIEF COMPLAINT:  Respiratory Failure   INITIAL PRESENTATION: 69 yo female presented with weakness and confusion after being tx for UTI as outpt.  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  Admitted with hepatic encephalopathy, VDRF, and ETOH withdrawal.  STUDIES:  7/26 CT head >> no ICH, mass or acute infarct 7/31 CT head: no acute findings 7/31 LUE [place]: superficial thrombus; begins AC end extends into shoulder.  8/2 EEG>>>non-specific cerebral dysfunction  SIGNIFICANT EVENTS: 7/26  Admit with altered mental status, hepatic encephalopathy.  Recent UTI  7/27  Decompensation requiring intubation, PCCM consulted  7/27 steroids started for ETOH hepatitis  7/31 left UE swelling. Large superficial thrombus in arm of PICC 8/1 weaned all day. PICC replaced and left PICC removed. Heparin resumed.  8/2 weaning. Localizing to noxious stim. EEG done 8/4 more awake, following some commands EEG still pending   SUBJECTIVE:  Continuing to have loose stool with rectal foley in place. More alert this morning & following commands. Nods no to any pain or difficulty breathing. Patient did have transient hypotension overnight on Precedex gtt and responded to d/c & fluid bolus.  ROS:  Unobtainable as patient is intubated.  VITAL SIGNS: Temp:  [98.1 F (36.7 C)-99.1 F (37.3 C)] 99.1 F (37.3 C) (08/06 0400) Pulse Rate:  [72-118] 102 (08/06 0800) Resp:  [11-25] 16 (08/06 0800) BP: (57-117)/(14-96) 117/38 mmHg (08/06 0800) SpO2:  [97 %-100 %] 98 % (08/06 0800) FiO2 (%):  [30 %] 30 % (08/06 0800) Weight:  [169 lb 5 oz (76.8 kg)] 169 lb 5 oz (76.8 kg) (08/06 0500) HEMODYNAMICS:   VENTILATOR SETTINGS: Vent Mode:  [-] PRVC FiO2 (%):  [30 %] 30 % Set Rate:  [15 bmp] 15 bmp Vt Set:   [500 mL] 500 mL PEEP:  [5 cmH20] 5 cmH20 Plateau Pressure:  [14 cmH20-16 cmH20] 15 cmH20   INTAKE / OUTPUT:  Intake/Output Summary (Last 24 hours) at 07/14/15 0904 Last data filed at 07/14/15 0800  Gross per 24 hour  Intake 1750.52 ml  Output   1085 ml  Net 665.52 ml    PHYSICAL EXAMINATION: General: Awake. No distress. Husband at bedside. Neuro: Following commands with appropriate hand raising. Moving all 4 extremities on command.  HEENT: ETT in place. MMM.  PULM:  Coarse BS bilaterally on vent. ETT in place. Symmetric chest rise. CV: Regular rate. No edema. Unable to appreciate JVD. GI: Soft. ND. Normal BS. Integument:  Bruising of various ages. Warm & dry. No rash.   LABS:  CBC  Recent Labs Lab 07/12/15 0522 07/13/15 0519 07/14/15 0405  WBC 13.3* 16.9* 16.4*  HGB 8.0* 8.6* 7.3*  HCT 25.3* 26.1* 22.3*  PLT 148* 166 146*   Coag's  Recent Labs Lab 07/11/15 0435 07/12/15 0625 07/13/15 0345 07/14/15 0405  APTT 164* 155* 138*  --   INR 1.41 1.45 1.41 1.39   BMET  Recent Labs Lab 07/12/15 0522 07/13/15 0519 07/14/15 0405  NA 134* 138 142  K 4.0 4.0 4.1  CL 106 109 114*  CO2 24 24 24   BUN 15 19 21*  CREATININE 0.37* 0.42* 0.43*  GLUCOSE 230* 105* 118*   Electrolytes  Recent Labs Lab 07/11/15 0440 07/12/15 0522 07/13/15 0519 07/14/15 0405  CALCIUM 7.7* 7.5*  7.8* 7.8*  MG 2.0 1.8 1.9  --   PHOS  --  2.2* 3.1 2.6  2.6   Sepsis Markers  Recent Labs Lab 07/11/15 1031 07/12/15 0522 07/13/15 0519  PROCALCITON 0.38 0.38 0.50   ABG No results for input(s): PHART, PCO2ART, PO2ART in the last 168 hours. Liver Enzymes  Recent Labs Lab 07/09/15 0535  07/11/15 0440 07/12/15 0522 07/13/15 0519 07/14/15 0405  AST 243*  --  239*  --  249*  --   ALT 104*  --  143*  --  188*  --   ALKPHOS 115  --  96  --  105  --   BILITOT 12.1*  --  9.6*  --  9.5*  --   ALBUMIN 1.5*  1.5*  < > 1.7* 1.7* 1.8* 1.8*  < > = values in this interval not  displayed.  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 Resolved  Tachycardia - resolved Hx HLD, SVT, PAD  P:  IVF bolus overnight Continue to monitor on telemetry  RENAL A:   Hyperchloremia Hypophosphatemia - Resolved  P:   Monitor UOP Monitor renal fx w/ daily BUN/Creatinine kvo IVF Trending electrolytes daily  GASTROINTESTINAL A:   ETOH  Abdominal Pain, Nausea / Vomiting - seen by GI Severe Protein Calorie Malnutrition  Hx Esophageal Stricture s/p Dilation (07/02/35)  Alcoholic hepatitis Hepatic steatosis Bili plateau -ing   P:    Hepatic function panel in AM Holding TF pending possible extubation Pepcid BID Thiamine, folate, MVI  Lactulose bid & rifaximin Prednisone  For alcoholic hepatitis per GI day x28 ds , started 7/26   HEMATOLOGIC / ONCOLOGY  A:   Coagulopathy  Anemia - stable Leukocytosis - stable w/o fever Chronic Anticoagulation for PE/DVT - one year ago Hx Infiltrating Ductal Carcinoma (2002) Superficial thrombus Left arm (PICC line) Slight hemoconcentration WBC on cbc  P:  Elevate LUE Lovenox [place] bid Monitoring daily Hgb w/ CBC Trending leukocytosis w/ daily CBC Transfuse per typical ICU protocol  INFECTIOUS A:   Possible UTI - Ctx neg Possible Bilateral LL Aspiration Pna  P:   Monitoring for fever Trending Leukocytosis daily Reculture for any fever >101F  Rocephin, start date  7/27>>>8/2  UC 7/27 >> negative  ENDOCRINE A:   Hypoglycemia - resolved.  P:   CBG Q4   NEUROLOGIC A:   Hepatic Encephalopathy  ETOH Abuse / Withdrawal  At risk ICP elevation    P:   RASS goal: 0 Lactulose bid Xifaxan maintain Thiamine / folate / MVI IV NO benzo fent prn  CT head obtained 7/31 w/out acute findings.   Family Updates - husband updated at bedside again today regarding plans for possible extubation.  Today's Summary  - Mental status markedly improved from when I saw her last. Leukocyte count stable without fever. SBT for possible extubation today.  I have spent a total of 32 minutes of critical care time today, caring for the patient,  updating the patient's husband, and discussing the plan of care with RN & RT at bedside.   Sonia Baller Ashok Cordia, M.D. [facility] Pulmonary & Critical Care Pager:  [phone] After 3pm or if no response, call [phone] 07/14/2015, 9:04 AM

## 2015-07-15 LAB — CBC
HEMATOCRIT: 25.6 % — AB (ref 36.0–46.0)
HEMOGLOBIN: 8.3 g/dL — AB (ref 12.0–15.0)
MCH: 39.7 pg — AB (ref 26.0–34.0)
MCHC: 32.4 g/dL (ref 30.0–36.0)
MCV: 122.5 fL — ABNORMAL HIGH (ref 78.0–100.0)
Platelets: 207 10*3/uL (ref 150–400)
RBC: 2.09 MIL/uL — ABNORMAL LOW (ref 3.87–5.11)
RDW: 15.4 % (ref 11.5–15.5)
WBC: 19.2 10*3/uL — ABNORMAL HIGH (ref 4.0–10.5)

## 2015-07-15 LAB — RENAL FUNCTION PANEL
ALBUMIN: 1.7 g/dL — AB (ref 3.5–5.0)
Anion gap: 5 (ref 5–15)
BUN: 22 mg/dL — ABNORMAL HIGH (ref 6–20)
CO2: 24 mmol/L (ref 22–32)
Calcium: 8.2 mg/dL — ABNORMAL LOW (ref 8.9–10.3)
Chloride: 113 mmol/L — ABNORMAL HIGH (ref 101–111)
Creatinine, Ser: <0.3 mg/dL — ABNORMAL LOW (ref 0.44–1.00)
GLUCOSE: 144 mg/dL — AB (ref 65–99)
PHOSPHORUS: 2.4 mg/dL — AB (ref 2.5–4.6)
Potassium: 4.2 mmol/L (ref 3.5–5.1)
SODIUM: 142 mmol/L (ref 135–145)

## 2015-07-15 LAB — GLUCOSE, CAPILLARY
GLUCOSE-CAPILLARY: 143 mg/dL — AB (ref 65–99)
GLUCOSE-CAPILLARY: 170 mg/dL — AB (ref 65–99)
Glucose-Capillary: 154 mg/dL — ABNORMAL HIGH (ref 65–99)
Glucose-Capillary: 136 mg/dL — ABNORMAL HIGH (ref 65–99)
Glucose-Capillary: 113 mg/dL — ABNORMAL HIGH (ref 65–99)
Glucose-Capillary: 166 mg/dL — ABNORMAL HIGH (ref 65–99)

## 2015-07-15 LAB — HEPATIC FUNCTION PANEL
ALT: 225 U/L — AB (ref 14–54)
AST: 247 U/L — ABNORMAL HIGH (ref 15–41)
Albumin: 1.8 g/dL — ABNORMAL LOW (ref 3.5–5.0)
Alkaline Phosphatase: 110 U/L (ref 38–126)
Bilirubin, Direct: 3.9 mg/dL — ABNORMAL HIGH (ref 0.1–0.5)
Indirect Bilirubin: 3.5 mg/dL — ABNORMAL HIGH (ref 0.3–0.9)
Total Bilirubin: 7.4 mg/dL — ABNORMAL HIGH (ref 0.3–1.2)
Total Protein: 5.5 g/dL — ABNORMAL LOW (ref 6.5–8.1)

## 2015-07-15 LAB — PROTIME-INR
INR: 1.21 (ref 0.00–1.49)
PROTHROMBIN TIME: 15.5 s — AB (ref 11.6–15.2)

## 2015-07-15 LAB — MAGNESIUM: MAGNESIUM: 1.9 mg/dL (ref 1.7–2.4)

## 2015-07-15 MED ORDER — VITAMIN B-1 100 MG PO TABS
100.0000 mg | ORAL_TABLET | Freq: Every day | ORAL | Status: DC
  Administered 2015-07-16 – 2015-07-19 (×4): 100 mg via ORAL
  Filled 2015-07-15 (×4): qty 1

## 2015-07-15 MED ORDER — RIFAXIMIN 200 MG PO TABS
400.0000 mg | ORAL_TABLET | Freq: Three times a day (TID) | ORAL | Status: DC
  Administered 2015-07-15 – 2015-07-16 (×5): 400 mg via ORAL
  Filled 2015-07-15 (×11): qty 2

## 2015-07-15 MED ORDER — FUROSEMIDE 10 MG/ML IJ SOLN
INTRAMUSCULAR | Status: AC
  Filled 2015-07-15: qty 2

## 2015-07-15 MED ORDER — FUROSEMIDE 10 MG/ML IJ SOLN
20.0000 mg | Freq: Once | INTRAMUSCULAR | Status: AC
  Administered 2015-07-15: 20 mg via INTRAVENOUS

## 2015-07-15 MED ORDER — LACTULOSE 10 GM/15ML PO SOLN
30.0000 g | Freq: Two times a day (BID) | ORAL | Status: DC
  Administered 2015-07-15 – 2015-07-19 (×8): 30 g via ORAL
  Filled 2015-07-15 (×9): qty 45

## 2015-07-15 MED ORDER — PREDNISONE 20 MG PO TABS
40.0000 mg | ORAL_TABLET | Freq: Every day | ORAL | Status: DC
  Filled 2015-07-15: qty 2

## 2015-07-15 MED ORDER — ADULT MULTIVITAMIN W/MINERALS CH
1.0000 | ORAL_TABLET | Freq: Every day | ORAL | Status: DC
  Administered 2015-07-16 – 2015-07-19 (×4): 1 via ORAL
  Filled 2015-07-15 (×4): qty 1

## 2015-07-15 MED ORDER — RANITIDINE HCL 150 MG/10ML PO SYRP
150.0000 mg | ORAL_SOLUTION | Freq: Two times a day (BID) | ORAL | Status: DC
  Administered 2015-07-15: 150 mg via ORAL
  Filled 2015-07-15 (×3): qty 10

## 2015-07-15 MED ORDER — CETYLPYRIDINIUM CHLORIDE 0.05 % MT LIQD
7.0000 mL | Freq: Two times a day (BID) | OROMUCOSAL | Status: DC
  Administered 2015-07-16 – 2015-07-19 (×6): 7 mL via OROMUCOSAL

## 2015-07-15 MED ORDER — FOLIC ACID 1 MG PO TABS
1.0000 mg | ORAL_TABLET | Freq: Every day | ORAL | Status: DC
  Administered 2015-07-16 – 2015-07-19 (×4): 1 mg via ORAL
  Filled 2015-07-15 (×4): qty 1

## 2015-07-15 MED ORDER — MIDAZOLAM HCL 2 MG/2ML IJ SOLN
INTRAMUSCULAR | Status: DC
Start: 2015-07-15 — End: 2015-07-15
  Filled 2015-07-15: qty 2

## 2015-07-15 MED ORDER — ALPRAZOLAM 0.25 MG PO TABS
0.2500 mg | ORAL_TABLET | Freq: Every evening | ORAL | Status: DC | PRN
  Administered 2015-07-15 – 2015-07-18 (×3): 0.25 mg via ORAL
  Filled 2015-07-15 (×3): qty 1

## 2015-07-15 NOTE — Progress Notes (Signed)
Patient extubated over bougie to nasal cannula. Good cough.  Sonia Baller Ashok Cordia, M.D.  Pulmonary & Critical Care Pager:  [phone] After 3pm or if no response, call [phone]

## 2015-07-15 NOTE — Progress Notes (Signed)
[facility] Progress Note Patient Name: Theresa [name] DOB: February 21, 1946 MRN: [id number]   Date of Service  07/15/2015  HPI/Events of Note  Patient with anxiety and agitation   eICU Interventions  Review of home med - xanax at night Restart xanax at night (0.25mg )     Intervention Category Minor Interventions: Agitation / anxiety - evaluation and management  Khaled Herda 07/15/2015, 10:00 PM

## 2015-07-15 NOTE — Progress Notes (Signed)
pro PULMONARY / CRITICAL CARE MEDICINE   Name: Theresa [name] MRN: [id number] DOB: 08-29-1946    ADMISSION DATE:  07/03/2015 CONSULTATION DATE:  07/04/15  REFERRING MD :  Dr. Clementeen Graham   CHIEF COMPLAINT:  Respiratory Failure   INITIAL PRESENTATION: 69 yo female presented with weakness and confusion after being tx for UTI as outpt.  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  Admitted with hepatic encephalopathy, VDRF, and ETOH withdrawal.  STUDIES:  7/26 CT head >> no ICH, mass or acute infarct 7/31 CT head: no acute findings 7/31 LUE [place]: superficial thrombus; begins AC end extends into shoulder.  8/2 EEG>>>non-specific cerebral dysfunction  SIGNIFICANT EVENTS: 7/26  Admit with altered mental status, hepatic encephalopathy.  Recent UTI  7/27  Decompensation requiring intubation, PCCM consulted  7/27 steroids started for ETOH hepatitis  7/31 left UE swelling. Large superficial thrombus in arm of PICC 8/1 weaned all day. PICC replaced and left PICC removed. Heparin resumed.  8/2 weaning. Localizing to noxious stim. EEG done 8/4 more awake, following some commands EEG still pending   SUBJECTIVE: No events overnight. Volume status even. Remains intubated after no cuff leak yesterday.  ROS:  Unobtainable as patient is intubated.  VITAL SIGNS: Temp:  [97.4 F (36.3 C)-99.4 F (37.4 C)] 98.3 F (36.8 C) (08/07 0900) Pulse Rate:  [86-116] 114 (08/07 0900) Resp:  [13-25] 22 (08/07 0900) BP: (87-187)/(33-125) 125/44 mmHg (08/07 0900) SpO2:  [95 %-100 %] 99 % (08/07 0900) FiO2 (%):  [30 %] 30 % (08/07 0800) Weight:  [165 lb 12.6 oz (75.2 kg)] 165 lb 12.6 oz (75.2 kg) (08/07 0500) HEMODYNAMICS:   VENTILATOR SETTINGS: Vent Mode:  [-] PSV FiO2 (%):  [30 %] 30 % Set Rate:  [15 bmp] 15 bmp Vt Set:  [500 mL] 500 mL PEEP:  [5 cmH20] 5 cmH20 Pressure Support:  [5 cmH20] 5 cmH20 Plateau Pressure:  [14 cmH20-26 cmH20] 18 cmH20   INTAKE /  OUTPUT:  Intake/Output Summary (Last 24 hours) at 07/15/15 1108 Last data filed at 07/15/15 0900  Gross per 24 hour  Intake   1585 ml  Output   1650 ml  Net    -65 ml    PHYSICAL EXAMINATION: General: Awake. Appears appropriate. Husband at bedside. Neuro: Following commands with appropriate hand raising. Moving all 4 extremities on command & equally. HEENT: ETT in place. MMM.  PULM:  Coarse BS bilaterally on vent. ETT in place. Symmetric chest rise. CV: Regular rate. No edema.  GI: Soft. ND. Normal BS. Integument:  Bruising of various ages. Warm & dry. No rash.   LABS:  CBC  Recent Labs Lab 07/12/15 0522 07/13/15 0519 07/14/15 0405  WBC 13.3* 16.9* 16.4*  HGB 8.0* 8.6* 7.3*  HCT 25.3* 26.1* 22.3*  PLT 148* 166 146*   Coag's  Recent Labs Lab 07/11/15 0435 07/12/15 0625 07/13/15 0345 07/14/15 0405 07/15/15 0540  APTT 164* 155* 138*  --   --   INR 1.41 1.45 1.41 1.39 1.21   BMET  Recent Labs Lab 07/13/15 0519 07/14/15 0405 07/15/15 0540  NA 138 142 142  K 4.0 4.1 4.2  CL 109 114* 113*  CO2 24 24 24   BUN 19 21* 22*  CREATININE 0.42* 0.43* <0.30*  GLUCOSE 105* 118* 144*   Electrolytes  Recent Labs Lab 07/12/15 0522 07/13/15 0519 07/14/15 0405 07/15/15 0540  CALCIUM 7.5* 7.8* 7.8* 8.2*  MG 1.8 1.9  --  1.9  PHOS 2.2*  3.1 2.6  2.6 2.4*   Sepsis Markers  Recent Labs Lab 07/11/15 1031 07/12/15 0522 07/13/15 0519  PROCALCITON 0.38 0.38 0.50   ABG No results for input(s): PHART, PCO2ART, PO2ART in the last 168 hours. Liver Enzymes  Recent Labs Lab 07/11/15 0440  07/13/15 0519 07/14/15 0405 07/15/15 0540  AST 239*  --  249*  --  247*  ALT 143*  --  188*  --  225*  ALKPHOS 96  --  105  --  110  BILITOT 9.6*  --  9.5*  --  7.4*  ALBUMIN 1.7*  < > 1.8* 1.8* 1.8*  1.7*  < > = values in this interval not displayed.  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 Resolved  Tachycardia - resolved Hx HLD, SVT, PAD  P:  Continue to monitor on telemetry  RENAL A:   Hyperchloremia - Improving Hypophosphatemia - Resolved  P:   Monitor UOP Monitor renal fx w/ daily BUN/Creatinine kvo IVF Trending electrolytes daily Gentle Diuresis w/ Lasix 20mg  IV once  GASTROINTESTINAL A:   ETOH  Abdominal Pain, Nausea / Vomiting - seen by GI Severe Protein Calorie Malnutrition  Hx Esophageal Stricture s/p Dilation (1/61/09)  Alcoholic hepatitis Hepatic steatosis Bili plateau -ing   P:    Holding TF pending extubation Swallow eval later today Pepcid BID Thiamine, folate, MVI  Lactulose bid & rifaximin once able to swallow Prednisone  For alcoholic hepatitis per GI day x28 ds , started 7/26   HEMATOLOGIC / ONCOLOGY  A:   Coagulopathy  Anemia - stable Leukocytosis - stable w/o fever Chronic Anticoagulation for PE/DVT - one year ago Hx Infiltrating Ductal Carcinoma (2002) Superficial thrombus Left arm (PICC line) Slight hemoconcentration WBC on cbc  P:  Elevate LUE Lovenox [place] bid Monitoring daily Hgb w/ CBC Trending leukocytosis w/ daily CBC Transfuse per typical ICU protocol  INFECTIOUS A:   Possible UTI - Ctx neg Possible Bilateral LL Aspiration Pna  P:   Monitoring for fever Trending Leukocytosis daily Reculture for any fever >101F  Rocephin, start date 7/27>>>8/2  UC 7/27 >> negative  ENDOCRINE A:   Hypoglycemia - resolved.  P:    CBG Q4   NEUROLOGIC A:   Hepatic Encephalopathy - resolved ETOH Abuse / Withdrawal  At risk ICP elevation   P:   RASS goal: 0 Lactulose bid once able to swallow Xifaxan once able to swallow Thiamine / folate / MVI IV NO benzo CT head obtained 7/31 w/out acute findings.   Family Updates - husband updated at bedside again today regarding plans for possible extubation. Also discussed with son-in-law via phone.  Today's Summary  - Mental status excellent. Extubation over bougie today to nasal cannula. Chest PT via bed. Swallow evaluation in a couple of hours if she remains stable. Gentle diuresis with Lasix IV.  I have spent a total of 36 minutes of critical care time today, caring for the patient,  updating the patient's husband, and discussing the plan of care with RN & RT at bedside.   Sonia Baller Ashok Cordia, M.D. [facility] Pulmonary & Critical Care Pager:  [phone] After 3pm or if no response, call [phone] 07/15/2015, 11:08 AM

## 2015-07-16 LAB — RENAL FUNCTION PANEL
ALBUMIN: 1.9 g/dL — AB (ref 3.5–5.0)
Anion gap: 5 (ref 5–15)
BUN: 21 mg/dL — AB (ref 6–20)
CHLORIDE: 112 mmol/L — AB (ref 101–111)
CO2: 25 mmol/L (ref 22–32)
CREATININE: 0.36 mg/dL — AB (ref 0.44–1.00)
Calcium: 8.1 mg/dL — ABNORMAL LOW (ref 8.9–10.3)
GFR calc Af Amer: >60 mL/min (ref >60)
GFR calc non Af Amer: >60 mL/min (ref >60)
Glucose, Bld: 73 mg/dL (ref 65–99)
POTASSIUM: 3.8 mmol/L (ref 3.5–5.1)
Phosphorus: 2.5 mg/dL (ref 2.5–4.6)
SODIUM: 142 mmol/L (ref 135–145)

## 2015-07-16 LAB — MAGNESIUM: Magnesium: 1.7 mg/dL (ref 1.7–2.4)

## 2015-07-16 LAB — PROTIME-INR
INR: 1.25 (ref 0.00–1.49)
Prothrombin Time: 15.9 seconds — ABNORMAL HIGH (ref 11.6–15.2)

## 2015-07-16 LAB — GLUCOSE, CAPILLARY
GLUCOSE-CAPILLARY: 123 mg/dL — AB (ref 65–99)
GLUCOSE-CAPILLARY: 160 mg/dL — AB (ref 65–99)
Glucose-Capillary: 59 mg/dL — ABNORMAL LOW (ref 65–99)
Glucose-Capillary: 69 mg/dL (ref 65–99)
Glucose-Capillary: 63 mg/dL — ABNORMAL LOW (ref 65–99)
Glucose-Capillary: 72 mg/dL (ref 65–99)
Glucose-Capillary: 130 mg/dL — ABNORMAL HIGH (ref 65–99)
Glucose-Capillary: 124 mg/dL — ABNORMAL HIGH (ref 65–99)

## 2015-07-16 LAB — CBC
HCT: 24.6 % — ABNORMAL LOW (ref 36.0–46.0)
Hemoglobin: 7.6 g/dL — ABNORMAL LOW (ref 12.0–15.0)
MCH: 38.2 pg — AB (ref 26.0–34.0)
MCHC: 30.9 g/dL (ref 30.0–36.0)
MCV: 123.6 fL — ABNORMAL HIGH (ref 78.0–100.0)
Platelets: 188 10*3/uL (ref 150–400)
RBC: 1.99 MIL/uL — ABNORMAL LOW (ref 3.87–5.11)
RDW: 15.3 % (ref 11.5–15.5)
WBC: 14.7 10*3/uL — AB (ref 4.0–10.5)

## 2015-07-16 MED ORDER — DEXTROSE 50 % IV SOLN
INTRAVENOUS | Status: AC
  Administered 2015-07-16: 25 mL
  Filled 2015-07-16: qty 50

## 2015-07-16 MED ORDER — METOPROLOL TARTRATE 12.5 MG HALF TABLET
12.5000 mg | ORAL_TABLET | Freq: Two times a day (BID) | ORAL | Status: DC
  Administered 2015-07-16 – 2015-07-19 (×7): 12.5 mg via ORAL
  Filled 2015-07-16 (×8): qty 1

## 2015-07-16 MED ORDER — RIVAROXABAN 15 MG PO TABS
15.0000 mg | ORAL_TABLET | Freq: Every day | ORAL | Status: DC
  Administered 2015-07-16 – 2015-07-19 (×4): 15 mg via ORAL
  Filled 2015-07-16 (×5): qty 1

## 2015-07-16 MED ORDER — PREDNISOLONE 15 MG/5ML PO SOLN
40.0000 mg | Freq: Every day | ORAL | Status: DC
  Administered 2015-07-16 – 2015-07-19 (×4): 40 mg via ORAL
  Filled 2015-07-16 (×6): qty 15

## 2015-07-16 NOTE — Care Management Important Message (Signed)
Important Message  Patient Details  Name: Theresa [name] MRN: [id number] Date of Birth: 12/28/1945   Medicare Important Message Given:  [facility] notification given    Camillo Flaming 07/16/2015, 12:01 [place] Message  Patient Details  Name: Theresa [name] MRN: [id number] Date of Birth: [birth year]   Medicare Important Message Given:  Yes-second notification given    Camillo Flaming 07/16/2015, 12:01 PM

## 2015-07-16 NOTE — Progress Notes (Signed)
ANTICOAGULATION CONSULT NOTE - Follow up  Pharmacy Consult for Lovenox >> Xarelto Indication: VTE Treatment/Hx PE (Xarelto PTA)  Allergies  Allergen Reactions  . Ciprofloxacin Hives  . Sulfa Antibiotics Hives    Patient Measurements: Height: 5\' 2"  (157.5 cm) Weight: 165 lb 12.6 oz (75.2 kg) IBW/kg (Calculated) : 50.1  Vital Signs: Temp: 97.3 F (36.3 C) (08/08 0500) Temp Source: Oral (08/08 0000) BP: 147/118 mmHg (08/08 0800) Pulse Rate: 95 (08/08 0800)  Labs:  Recent Labs  07/14/15 0405 07/15/15 0540 07/15/15 1200 07/16/15 0458  HGB 7.3*  --  8.3* 7.6*  HCT 22.3*  --  25.6* 24.6*  PLT 146*  --  207 188  LABPROT 17.1* 15.5*  --  15.9*  INR 1.39 1.21  --  1.25  CREATININE 0.43* <0.30*  --  0.36*    Estimated Creatinine Clearance: 63 mL/min (by C-G formula based on Cr of 0.36).  Assessment: 69 yr female with significant PMH including ETOH abuse, HTN, DVT & PE.Patient was on Xarelto 15mg  daily PTA with reported last dose on 7/25.  Heparin drip was started while patient was NPO then later discontinued by CCM due to liver disease and since patient had completed treatment course for PE after 1 year.  On 7/31, heparin infusion was resumed after LUE [place] found large superficial thrombus in arm of PICC.  Switched to Lovenox 8/5; now to return to Xarelto today.  Today, 07/16/2015:   Hgb lower at 7.6, Pltc WNL. Hx chronic anemia, MD following with standing order to transfuse; no visible bleeding  No bleeding issues reported.  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  Also note that patient was previously on reduced dose of Xarelto per  PCP for clot ~1 yr ago.  Discussed with CCM that no data exists regarding Xarelto treatment in this patient, particularly in initial 21-day window.  CCM feels that given this LUE clot is low risk and precipitating factor is known (PICC) and has been removed, will not treat as a new clot, but will extend patient's previous chronic Xarelto dose indefinitely.   Resume Xarelto 15 mg PO daily w/c  CBC, SCr at least q72 hr while on Xarelto  F/u for signs of bleeding or thrombosis  Reuel Boom, PharmD, BCPS Pager: [phone] 07/16/2015, 8:49 AM

## 2015-07-16 NOTE — Progress Notes (Signed)
Inpatient Rehabilitation  Patient was screened by Gerlean Ren for appropriateness for an Inpatient Acute Rehab consult.  At this time, we are not recommending Inpatient Rehab consult due to pt's limited activity tolerance for therapies.   If pt. able to progress her activity tolerance over the next session or two, please contact  admissions coordinator and we will be glad to look at her again.   Thank you!  [place] Admissions Coordinator Cell [phone] Office [phone]

## 2015-07-16 NOTE — Evaluation (Signed)
Physical Therapy Evaluation Patient Details Name: Theresa [name] MRN: [id number] DOB: [birth year] Today's Date: 07/16/2015   History of Present Illness  69 yo female admitted 7/26 with hepatic encephalopathy, VDRF, and ETOH withdrawal. Intubated 7/27 and extubated 07/15/15. Hx of ETOH abuse, PE, DVT, SVT, colitis.   Clinical Impression  On eval, pt required Max assist +2 for rolling and Total assist +2 for sit<>supine. Noted pt to have increased tone in bilateral LEs with significantly plantarflexed and internally rotated feel and ankles. Unable to attempt standing or transfer on today. Will follow.     Follow Up Recommendations CIR;Supervision/Assistance - 24 hour    Equipment Recommendations   (to be determined)    Recommendations for Other Services Rehab consult     Precautions / Restrictions Precautions Precautions: Fall Restrictions Weight Bearing Restrictions: No      Mobility  Bed Mobility Overal bed mobility: Needs Assistance Bed Mobility: Supine to Sit;Sit to Supine Rolling: Max assist;+2 for physical assistance   Supine to sit: +2 for physical assistance;Total assist Sit to supine: +2 for physical assistance;Total assist   General bed mobility comments: use of bed bad to help roll, pt needed assist to place UE on bedrail  and to bed knee prior to roll to attempt to self assist. significant assist to bring LEs over to EOB and bring trunk to upright.   Transfers                 General transfer comment: did not attempt this session.   Ambulation/Gait                Stairs            Wheelchair Mobility    Modified Rankin (Stroke Patients Only)       Balance Overall balance assessment: Needs assistance Sitting-balance support: Bilateral upper extremity supported;Feet supported Sitting balance-Leahy Scale: Fair Sitting balance - Comments: Sat EOB at least 10 minutes with level of assist eventually progressing to Min guard-static                                      Pertinent Vitals/Pain Pain Assessment: No/denies pain    Home Living Family/patient expects to be discharged to:: Unsure Living Arrangements: Spouse/significant other Available Help at Discharge: Family           Home Equipment: Gilford Rile - 2 wheels      Prior Function Level of Independence: Independent         Comments: pt states she did her own bathing, dressing and other ADL without difficulty.      Hand Dominance   Dominant Hand: Right    Extremity/Trunk Assessment   Upper Extremity Assessment: Defer to OT evaluation           Lower Extremity Assessment: RLE deficits/detail;LLE deficits/detail RLE Deficits / Details: Strength at hip ~2/5, knee ~1/5, DF/PF 0/5. Increased tone. Foot/ankle significantly plantarflexed and internally rotated-unable to be ranged to neutral LLE Deficits / Details: Strength at hip ~2/5, knee ~1/5, DF/PF 0/5. Increased tone. Foot/ankle significantly plantarflexed and internally rotated-unable to be ranged to neutral  Cervical / Trunk Assessment: Normal  Communication   Communication: No difficulties  Cognition Arousal/Alertness: Lethargic Behavior During Therapy: Flat affect Overall Cognitive Status: No family/caregiver present to determine baseline cognitive functioning  General Comments General comments (skin integrity, edema, etc.): sat EOB  with min guard assist static sitting and min assist with reaching activity.    Exercises        Assessment/Plan    PT Assessment Patient needs continued PT services  PT Diagnosis Difficulty walking;Abnormality of gait;Generalized weakness   PT Problem List Decreased strength;Decreased range of motion;Decreased activity tolerance;Decreased balance;Decreased mobility;Decreased coordination;Decreased knowledge of use of DME;Impaired tone  PT Treatment Interventions DME instruction;Gait training;Functional mobility  training;Therapeutic activities;Patient/family education;Balance training;Therapeutic exercise;Neuromuscular re-education   PT Goals (Current goals can be found in the Care Plan section) Acute Rehab PT Goals Patient Stated Goal: agreeable to therapy PT Goal Formulation: With patient Time For Goal Achievement: 07/30/15 Potential to Achieve Goals: Good    Frequency Min 3X/week   Barriers to discharge        Co-evaluation   Reason for Co-Treatment: Complexity of the patient's impairments (multi-system involvement);For patient/therapist safety   OT goals addressed during session: ADL's and self-care;Proper use of Adaptive equipment and DME       End of Session   Activity Tolerance: Patient limited by fatigue Patient left: in bed;with call bell/phone within reach           Time: 1005-1038 PT Time Calculation (min) (ACUTE ONLY): 33 min   Charges:   PT Evaluation $Initial PT Evaluation Tier I: 1 Procedure     PT G Codes:        Weston Anna, MPT Pager: [phone]

## 2015-07-16 NOTE — Progress Notes (Signed)
Physician transfer Summary       Patient ID: Theresa [name] MRN: [id number] DOB/AGE: [birth year] 69 y.o.  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 Resolved  Tachycardia - resolved Hx HLD Hx SVT PAD Hyperchloremia - Improving Hypophosphatemia - Resolved ETOH  Abdominal Pain, Nausea / Vomiting - seen by GI Severe Protein Calorie Malnutrition  Hx Esophageal Stricture s/p Dilation (05/31/75)  Alcoholic hepatitis Hepatic steatosis Coagulopathy  Anemia - stable Leukocytosis - stable w/o fever Chronic Anticoagulation for PE/DVT - one year ago Hx Infiltrating Ductal Carcinoma (2002) Superficial thrombus Left arm (PICC line) Slight hemoconcentration WBC on cbc Possible UTI - Ctx neg Possible Bilateral LL Aspiration Pna:  Hepatic Encephalopathy - resolved ETOH Abuse / Withdrawal  At risk ICP elevation   Detailed Hospital Course:   69 yo female presented with weakness and confusion after being tx for UTI as outpt. 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 Admitted 7/26 with hepatic encephalopathy, VDRF, and ETOH withdrawal. High lights of her hospital course include:   7/26 Admit with altered mental status, hepatic encephalopathy. Recent UTI  7/27 Decompensation requiring intubation, PCCM consulted  7/27 steroids started for ETOH hepatitis  7/31 left UE swelling. Large superficial thrombus in arm of PICC 8/1 weaned all day. PICC replaced and left PICC removed. Heparin resumed.  8/2 weaning. Localizing to noxious stim. EEG done 8/4 more awake, following some commands. EEG:  consistent with a generalized non-specific cerebral dysfunction(encephalopathy). There was no seizure or seizure predisposition recorded on this  study.  8/5 making more improvement.  8/7 extubated over bougie as had no cuff leak from ETT.  8/8 awake, alert, following commands and appropriate. Tolerating diet, physical therapy started. Transferred to IM service and transferred to [place].   active problems   Acute hepatic encephalopathy: in setting of ETOH. Remarkably improved.  Plan Continue lactulose BID and xifaxan daily Continue thiamine and folate Have discussed importance of alcohol cessation at length w/ family.  D/c flexiseal (was placed while pt was encephalopathic) Ok to resume her low dose xanax at [facility] but would caution against additional benzos  Acute alcoholic hepatitis  Hepatic steatosis Plan Continue prednisolone to complete total of 28d (started 7/26) Trend LFTs and T bili  HTN Plan Add 12.5 lopressor BID as this will also help decrease risk of ascites.    Physical deconditioning and severe protein calorie malnutrition Plan Continue PT/OT Adv diet Will need SNF for further rehab efforts.   H/o PE/DVT LUE superficial thrombus at PICC site.  Anemia of chronic disease and critical illness Plan Resume Xarelto at prior dosing. Will not load given liver disease and LUE was only superficial thrombus from [facility] tests/ studies  Consults: GI   Discharge Exam: BP 143/45 mmHg  Pulse 94  Temp(Src) 97.3 F (36.3 C) (Oral)  Resp 16  Ht 5\' 2"  (1.575 m)  Wt 75.2 kg (165 lb 12.6 oz)  BMI 30.31 kg/m2  SpO2 100%  General: Awake. Appears appropriate. Husband at bedside. Neuro: Following commands with appropriate hand raising. Oriented  HEENT:MMM, no JVD. Phonation strong. No upper airway noises  PULM: clear  CV: Regular rate. No edema.  GI: Soft. ND. Normal BS. Integument: Bruising of various ages. Warm & dry. No rash.  Labs  at discharge Lab Results  Component Value Date   CREATININE 0.36* 07/16/2015   BUN 21* 07/16/2015   NA 142 07/16/2015   K 3.8 07/16/2015     CL 112* 07/16/2015   CO2 25 07/16/2015   Lab Results  Component Value Date   WBC 14.7* 07/16/2015   HGB 7.6* 07/16/2015   HCT 24.6* 07/16/2015   MCV 123.6* 07/16/2015   PLT 188 07/16/2015   Lab Results  Component Value Date   ALT 225* 07/15/2015   AST 247* 07/15/2015   ALKPHOS 110 07/15/2015   BILITOT 7.4* 07/15/2015   Lab Results  Component Value Date   INR 1.25 07/16/2015   INR 1.21 07/15/2015   INR 1.39 07/14/2015    Current radiology studies No results found.  Disposition:  01-Home or Self Care   . antiseptic oral rinse  7 mL Mouth Rinse BID  . enoxaparin (LOVENOX) injection  1 mg/kg Subcutaneous Q12H  . folic acid  1 mg Oral Daily  . lactulose  30 g Oral BID  . metoprolol tartrate  12.5 mg Oral BID  . multivitamin with minerals  1 tablet Oral Daily  . prednisoLONE  40 mg Oral Q breakfast  . rifaximin  400 mg Oral 3 times per day  . sodium chloride  10-40 mL Intracatheter Q12H  . thiamine  100 mg Oral Daily     Discharged Condition: Medically stable to d/c to Medsurg. IM service will assume care. Care at this point to focus on rehab efforts and d/c planning.    Physician Statement:  Patient successfully extubated 8/7 & continuing to wean FiO2. Continuing treatment of alcoholic hepatitis with Prednisone and on CIWA. Resumed home Xanax qhs last evening. Plan to transition to oral anticoagulation given h/o clot & off of Lovenox. PT/OT consulted to provide recommendations on need for rehab. Husband at bedside and notified of plan to transition to hospitalist service and out of ICU today. I did discuss the need to completely abstain from alcohol with the patient yesterday afternoon without husband present and explained that it will kill her if she does not. .   SignedSonia Baller. Ashok Cordia, M.D. [facility] Pager:  [phone] After 3pm or if no response, call [phone]  07/16/2015, 8:34 AM

## 2015-07-16 NOTE — Progress Notes (Signed)
Date:  July 16, 2015 U.R. performed for needs and level of care. Will continue to follow for Case Management needs.  Rhonda Davis, RN, BSN, CCM   [phone] 

## 2015-07-16 NOTE — Evaluation (Signed)
Occupational Therapy Evaluation Patient Details Name: Theresa [name] MRN: [id number] DOB: [birth year] Today's Date: 07/16/2015    History of Present Illness Theresa [name] is a 69 y.o. 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 Per notes pt  noted to ingest large amounts of ETOH on a daily basis.  Pt intubated from 07/04/15 to 07/15/15.    Clinical Impression   Pt with significant weakness from prolonged best rest period. Worked on bed mobility and sitting EOB this visit. Will benefit from continued OT to progress ADL independence for next venue. Pt currently requiring max total assist X2 for all ADL and functional transfers currently.     Follow Up Recommendations  CIR;Supervision/Assistance - 24 hour    Equipment Recommendations  3 in 1 bedside comode    Recommendations for Other Services Rehab consult     Precautions / Restrictions Precautions Precautions: Fall Restrictions Weight Bearing Restrictions: No      Mobility Bed Mobility Overal bed mobility: Needs Assistance Bed Mobility: Rolling;Supine to Sit;Sit to Supine Rolling: Max assist;+2 for physical assistance   Supine to sit: +2 for physical assistance;Total assist Sit to supine: +2 for physical assistance;Total assist   General bed mobility comments: use of bed bad to help roll, pt needed assist to place UE on bedrail to attempt to self assist. significant assist to bring LEs over to EOB and bring trunk to upright.   Transfers                 General transfer comment: did not attempt this session.     Balance                                            ADL Overall ADL's : Needs assistance/impaired Eating/Feeding: Minimal assistance;Bed level   Grooming: Wash/dry face;Bed level;Set up   Upper Body Bathing: Sitting;Moderate assistance   Lower Body Bathing: Bed  level;+2 for physical assistance;Total assistance   Upper Body Dressing : Maximal assistance;Bed level   Lower Body Dressing: Total assistance;+2 for physical assistance;Bed level                 General ADL Comments: Sat EOB for several minutes with min guard assist for static sitting but with dynamic reaching activity pt required min assist for balance. Pt alittle groggy during session but able to participate. Pt did roll in bed but with much assist and guidance to use UEs to reach for rail and then assist for trunk to roll completely. Assisted total assist with periarea hygiene. Small amount of BM cleaned up from pt from flexiseal being removed just prior to session. See PT notes for specifics regarding weakness in LEs--didn't attempt standing this visit.      Vision     Perception     Praxis      Pertinent Vitals/Pain Pain Assessment: No/denies pain     Hand Dominance Right   Extremity/Trunk Assessment Upper Extremity Assessment Upper Extremity Assessment: Generalized weakness (grossly 3/5 strength throughout bilaterally. Poor grip strength bilaterally)           Communication Communication Communication: No difficulties   Cognition Arousal/Alertness: Lethargic (groggy/sleepy at start of session but able to wake enough to participate in session) Behavior During Therapy: [facility] for tasks assessed/performed Overall Cognitive  Status: No family/caregiver present to determine baseline cognitive functioning (caregiver present at start of session but stepped out. Uncertain of baseline cognition)                     General Comments       Exercises       Shoulder Instructions      Home Living Family/patient expects to be discharged to:: Unsure Living Arrangements: Spouse/significant other                                      Prior Functioning/Environment Level of Independence: Independent        Comments: pt states she did her own  bathing, dressing and other ADL without difficulty.     OT Diagnosis: Generalized weakness   OT Problem List: Decreased strength;Decreased knowledge of use of DME or AE   OT Treatment/Interventions: Self-care/ADL training;Patient/family education;Therapeutic activities;DME and/or AE instruction    OT Goals(Current goals can be found in the care plan section) Acute Rehab OT Goals Patient Stated Goal: agreeable to therapy OT Goal Formulation: With patient Time For Goal Achievement: 07/30/15 Potential to Achieve Goals: Good  OT Frequency: Min 2X/week   Barriers to D/C:            Co-evaluation PT/OT/SLP Co-Evaluation/Treatment: Yes Reason for Co-Treatment: Complexity of the patient's impairments (multi-system involvement);For patient/therapist safety   OT goals addressed during session: ADL's and self-care;Proper use of Adaptive equipment and DME      End of Session Nurse Communication: Mobility status  Activity Tolerance: Patient tolerated treatment well Patient left: in bed;with call bell/phone within reach   Time: 1006-1038 OT Time Calculation (min): 32 min Charges:  OT General Charges $OT Visit: 1 Procedure OT Evaluation $Initial OT Evaluation Tier I: 1 Procedure G-Codes:    Jules Schick  [phone] 07/16/2015, 12:13 PM

## 2015-07-17 ENCOUNTER — Encounter (HOSPITAL_COMMUNITY): Payer: Self-pay | Admitting: Radiology

## 2015-07-17 DIAGNOSIS — Z86711 Personal history of pulmonary embolism: Secondary | ICD-10-CM

## 2015-07-17 DIAGNOSIS — J9600 Acute respiratory failure, unspecified whether with hypoxia or hypercapnia: Secondary | ICD-10-CM

## 2015-07-17 DIAGNOSIS — G8250 Quadriplegia, unspecified: Secondary | ICD-10-CM

## 2015-07-17 LAB — CBC
HCT: 24.3 % — ABNORMAL LOW (ref 36.0–46.0)
Hemoglobin: 7.7 g/dL — ABNORMAL LOW (ref 12.0–15.0)
MCH: 38.7 pg — AB (ref 26.0–34.0)
MCHC: 31.7 g/dL (ref 30.0–36.0)
MCV: 122.1 fL — AB (ref 78.0–100.0)
Platelets: 203 10*3/uL (ref 150–400)
RBC: 1.99 MIL/uL — ABNORMAL LOW (ref 3.87–5.11)
RDW: 14.8 % (ref 11.5–15.5)
WBC: 12.2 10*3/uL — AB (ref 4.0–10.5)

## 2015-07-17 LAB — RENAL FUNCTION PANEL
ALBUMIN: 1.8 g/dL — AB (ref 3.5–5.0)
ANION GAP: 5 (ref 5–15)
BUN: 16 mg/dL (ref 6–20)
CO2: 25 mmol/L (ref 22–32)
Calcium: 7.9 mg/dL — ABNORMAL LOW (ref 8.9–10.3)
Chloride: 113 mmol/L — ABNORMAL HIGH (ref 101–111)
Creatinine, Ser: 0.42 mg/dL — ABNORMAL LOW (ref 0.44–1.00)
GFR calc Af Amer: >60 mL/min (ref >60)
GFR calc non Af Amer: >60 mL/min (ref >60)
GLUCOSE: 77 mg/dL (ref 65–99)
PHOSPHORUS: 3.3 mg/dL (ref 2.5–4.6)
POTASSIUM: 3.9 mmol/L (ref 3.5–5.1)
Sodium: 143 mmol/L (ref 135–145)

## 2015-07-17 LAB — GLUCOSE, CAPILLARY
GLUCOSE-CAPILLARY: 74 mg/dL (ref 65–99)
GLUCOSE-CAPILLARY: 108 mg/dL — AB (ref 65–99)
Glucose-Capillary: 100 mg/dL — ABNORMAL HIGH (ref 65–99)
Glucose-Capillary: 87 mg/dL (ref 65–99)

## 2015-07-17 LAB — MAGNESIUM: MAGNESIUM: 1.8 mg/dL (ref 1.7–2.4)

## 2015-07-17 LAB — CK: CK TOTAL: 109 U/L (ref 38–234)

## 2015-07-17 LAB — PROTIME-INR
INR: 1.61 — AB (ref 0.00–1.49)
Prothrombin Time: 19.2 seconds — ABNORMAL HIGH (ref 11.6–15.2)

## 2015-07-17 MED ORDER — RIFAXIMIN 550 MG PO TABS
550.0000 mg | ORAL_TABLET | Freq: Two times a day (BID) | ORAL | Status: DC
  Administered 2015-07-17 – 2015-07-19 (×5): 550 mg via ORAL
  Filled 2015-07-17 (×6): qty 1

## 2015-07-17 NOTE — Consult Note (Signed)
NEURO HOSPITALIST CONSULT NOTE   Referring physician:Dr Elgergawy Reason for Consult: bilateral weakness upper and lower extremities  HPI:                                                                                                                                          Theresa [name] is an 69 y.o. female with multiple medical problems including hyperlipidemia, DVT previously on Xarelto, PAD, ETOH abuse, chronic respiratory failure, right breast malignancy s/p lumpectomy, admitted to [facility] due to acute hyperbaric respiratory failure secondary to hepatic encephalopathy and alcoholic hepatitis with alcohol withdrawal. Patient was admitted to the ICU, intubated on 7/27 and successfully extubated om 8/7. She has been treated with steroids for alcoholic hepatitis. A neurological consultation was requested for evaluation of severe generalized weakness. I did review all available clinical notes as well as CT brain that did not reveal acute abnormalities. Patient husband is at the bedside and informs me that his wife has been getting progressively weak over the past year, not able to do much but up until this hospitalization she was still capable to ambulate around the house with a cane or a walker. Husband stated that for that reason she was at some point evaluated by outpatient neurology. She was noted to have severe weakness and inability to stand up and ambulate after extubation. Further, husband report that " her feet deformity was not present before coming in". Patient reports numbness, tingling, pain and swelling of her feet and legs but denies HA, vertigo, double vision, difficulty swallowing, slurred speech, language dysfunction, visual disturbances, bladder or bowel impairment. Review of the inpatient records reveals no evidence of sepsis, multiorgan failure, or use of depolarizing anesthetic agents.  Past Medical History  Diagnosis Date  . Esophageal stricture   . HLD  (hyperlipidemia)   . Dysthymic disorder   . Other and unspecified coagulation defects   . Diaphragmatic hernia without mention of obstruction or gangrene   . PAD (peripheral artery disease)   . Pulmonary embolism 06/2014    "both lungs"  . DVT (deep venous thrombosis) 06/2014    RLE  . GERD (gastroesophageal reflux disease)   . History of hiatal hernia   . Anxiety   . Malignant neoplasm of breast (female), unspecified site 2002    "right", NO BLOOD PRESSURES OR STICKS IN RIGHT ARM  . SVT (supraventricular tachycardia) 11/07/2014  . On home oxygen therapy     "2L at night and prn" (11/08/2014)  . Atherosclerosis   . Alcohol abuse   . Chronic respiratory failure   . Colitis     Past Surgical History  Procedure Laterality Date  . Appendectomy  12/1977  . Cholecystectomy  12/1977  . Breast lumpectomy Right 01/2001  . Tubal ligation  1980's  .  Cardiac catheterization  1990's  . Esophagogastroduodenoscopy (egd) with esophageal dilation  X 7  . Esophagogastroduodenoscopy N/A 06/06/2015    Procedure: ESOPHAGOGASTRODUODENOSCOPY (EGD);  Surgeon: Inda Castle, MD;  Location: [facility];  Service: Endoscopy;  Laterality: N/A;    Family History  Problem Relation Age of Onset  . Heart disease Father   . Heart attack Father   . Colon cancer Neg Hx   . Lung cancer Maternal Grandfather   . Deep vein thrombosis Daughter     Family History: no epilepsy, MS, brain tumor, or brain aneurysms.   Social History:  reports that she quit smoking about 16 years ago. Her smoking use included Cigarettes. She has a 57 pack-year smoking history. She has never used smokeless tobacco. She reports that she drinks about 27.6 oz of alcohol per week. She reports that she does not use illicit drugs.  Allergies  Allergen Reactions  . Ciprofloxacin Hives  . Sulfa Antibiotics Hives    MEDICATIONS:                                                                                                                      Scheduled: . antiseptic oral rinse  7 mL Mouth Rinse BID  . folic acid  1 mg Oral Daily  . lactulose  30 g Oral BID  . metoprolol tartrate  12.5 mg Oral BID  . multivitamin with minerals  1 tablet Oral Daily  . prednisoLONE  40 mg Oral Q breakfast  . rifaximin  550 mg Oral BID  . rivaroxaban  15 mg Oral Q breakfast  . sodium chloride  10-40 mL Intracatheter Q12H  . thiamine  100 mg Oral Daily     ROS:                                                                                                                                       History obtained from husband, chart review and the patient  General ROS: negative for - chills, fatigue, fever, night sweats, or weight gain Psychological ROS: negative for - behavioral disorder, hallucinations, memory difficulties, or suicidal ideation Ophthalmic ROS: negative for - blurry vision, double vision, eye pain or loss of vision ENT ROS: negative for - epistaxis, nasal discharge, oral lesions, sore throat, tinnitus or vertigo Allergy and Immunology ROS: negative for - hives or itchy/watery eyes Hematological and Lymphatic ROS: negative for - bleeding  problems, bruising or swollen lymph nodes Endocrine ROS: negative for - galactorrhea, hair pattern changes, polydipsia/polyuria or temperature intolerance Respiratory ROS: negative for - cough or hemoptysis Cardiovascular ROS: negative for - chest pain, or irregular heartbeat Gastrointestinal ROS: negative for - abdominal pain, diarrhea, hematemesis, nausea/vomiting or stool incontinence Genito-Urinary ROS: negative for - dysuria, hematuria, incontinence or urinary frequency/urgency Musculoskeletal ROS: negative for - joint swelling Neurological ROS: as noted in HPI Dermatological ROS: negative for rash and skin lesion changes   Physical exam: pleasant female in no apparent distress. Blood pressure 108/61, pulse 78, temperature 98 F (36.7 C), temperature source Oral, resp. rate 18, height 5'  2" (1.575 m), weight 75.2 kg (165 lb 12.6 oz), SpO2 98 %. Eyes: jaundice Head: normocephalic. Neck: supple, no bruits, no JVD. Cardiac: no murmurs. Lungs: clear. Abdomen: soft, no tender, no mass. Extremities: bilateral LE edema. Inwards feet deviation. High arch feet. Skin: no rash  Neurologic Examination:                                                                                                      General: Mental Status: Alert, oriented, thought content appropriate.  Speech fluent without evidence of aphasia.  Able to follow 3 step commands without difficulty. Cranial Nerves: II: Discs flat bilaterally; Visual fields grossly normal, pupils equal, round, reactive to light and accommodation III,IV, VI: ptosis not present, extra-ocular motions intact bilaterally V,VII: smile symmetric, facial light touch sensation normal bilaterally VIII: hearing normal bilaterally IX,X: uvula rises symmetrically XI: bilateral shoulder shrug XII: midline tongue extension without atrophy or fasciculations Motor: There is 4/5 weakness proximally and distally in the upper extremities. 3/5 right and 4/5 left hip flexors, knee extensors 2/5, 0/5 foot evertors, 2/5 dorsiflexors. Tone and bulk:normal tone throughout; no atrophy noted Sensory: Pinprick and light touch intact throughout, bilaterally. Joint position sense impaired at the toes Deep Tendon Reflexes:  Brisk at the biceps and triceps, but areflexia at the patella and ankle bilaterally. Plantars: Right: downgoing   Left: downgoing Cerebellar: normal finger-to-nose,  Patient with profound bilateral LE weakness that precludes heel-to-shin testing Gait: Patient is unable to stand up and walk, thus gait can not be assessed.  No results found for: CHOL  Results for orders placed or performed during the hospital encounter of 07/03/15 (from the past 48 hour(s))  Glucose, capillary     Status: Abnormal   Collection Time: 07/15/15  4:49 PM   Result Value Ref Range   Glucose-Capillary 166 (H) 65 - 99 mg/dL  Glucose, capillary     Status: Abnormal   Collection Time: 07/15/15  7:39 PM  Result Value Ref Range   Glucose-Capillary 143 (H) 65 - 99 mg/dL   Comment 1 Notify RN    Comment 2 Document in Chart   Glucose, capillary     Status: Abnormal   Collection Time: 07/15/15 11:58 PM  Result Value Ref Range   Glucose-Capillary 123 (H) 65 - 99 mg/dL   Comment 1 Notify RN    Comment 2 Document in Chart   Glucose, capillary  Status: None   Collection Time: 07/16/15  4:47 AM  Result Value Ref Range   Glucose-Capillary 69 65 - 99 mg/dL  Protime-INR     Status: Abnormal   Collection Time: 07/16/15  4:58 AM  Result Value Ref Range   Prothrombin Time 15.9 (H) 11.6 - 15.2 seconds   INR 1.25 0.00 - 1.49  Renal function panel     Status: Abnormal   Collection Time: 07/16/15  4:58 AM  Result Value Ref Range   Sodium 142 135 - 145 mmol/L   Potassium 3.8 3.5 - 5.1 mmol/L   Chloride 112 (H) 101 - 111 mmol/L   CO2 25 22 - 32 mmol/L   Glucose, Bld 73 65 - 99 mg/dL   BUN 21 (H) 6 - 20 mg/dL   Creatinine, Ser 0.36 (L) 0.44 - 1.00 mg/dL   Calcium 8.1 (L) 8.9 - 10.3 mg/dL   Phosphorus 2.5 2.5 - 4.6 mg/dL   Albumin 1.9 (L) 3.5 - 5.0 g/dL   GFR calc non Af Amer >60 >60 mL/min   GFR calc Af Amer >60 >60 mL/min    Comment: (NOTE) The eGFR has been calculated using the CKD EPI equation. This calculation has not been validated in all clinical situations. eGFR's persistently <60 mL/min signify possible Chronic Kidney Disease.    Anion gap 5 5 - 15  Magnesium     Status: None   Collection Time: 07/16/15  4:58 AM  Result Value Ref Range   Magnesium 1.7 1.7 - 2.4 mg/dL  CBC     Status: Abnormal   Collection Time: 07/16/15  4:58 AM  Result Value Ref Range   WBC 14.7 (H) 4.0 - 10.5 K/uL   RBC 1.99 (L) 3.87 - 5.11 MIL/uL   Hemoglobin 7.6 (L) 12.0 - 15.0 g/dL   HCT 24.6 (L) 36.0 - 46.0 %   MCV 123.6 (H) 78.0 - 100.0 fL   MCH 38.2 (H)  26.0 - 34.0 pg   MCHC 30.9 30.0 - 36.0 g/dL   RDW 15.3 11.5 - 15.5 %   Platelets 188 150 - 400 K/uL  Glucose, capillary     Status: Abnormal   Collection Time: 07/16/15  5:02 AM  Result Value Ref Range   Glucose-Capillary 59 (L) 65 - 99 mg/dL  Glucose, capillary     Status: Abnormal   Collection Time: 07/16/15  5:27 AM  Result Value Ref Range   Glucose-Capillary 160 (H) 65 - 99 mg/dL  Glucose, capillary     Status: Abnormal   Collection Time: 07/16/15  7:48 AM  Result Value Ref Range   Glucose-Capillary 63 (L) 65 - 99 mg/dL   Comment 1 Notify RN    Comment 2 Document in Chart   Glucose, capillary     Status: None   Collection Time: 07/16/15 12:44 PM  Result Value Ref Range   Glucose-Capillary 72 65 - 99 mg/dL  Glucose, capillary     Status: Abnormal   Collection Time: 07/16/15  4:00 PM  Result Value Ref Range   Glucose-Capillary 130 (H) 65 - 99 mg/dL  Glucose, capillary     Status: Abnormal   Collection Time: 07/16/15  8:30 PM  Result Value Ref Range   Glucose-Capillary 124 (H) 65 - 99 mg/dL  Protime-INR     Status: Abnormal   Collection Time: 07/17/15  4:15 AM  Result Value Ref Range   Prothrombin Time 19.2 (H) 11.6 - 15.2 seconds   INR 1.61 (H) 0.00 - 1.49  Renal function panel     Status: Abnormal   Collection Time: 07/17/15  4:15 AM  Result Value Ref Range   Sodium 143 135 - 145 mmol/L   Potassium 3.9 3.5 - 5.1 mmol/L   Chloride 113 (H) 101 - 111 mmol/L   CO2 25 22 - 32 mmol/L   Glucose, Bld 77 65 - 99 mg/dL   BUN 16 6 - 20 mg/dL   Creatinine, Ser 0.42 (L) 0.44 - 1.00 mg/dL   Calcium 7.9 (L) 8.9 - 10.3 mg/dL   Phosphorus 3.3 2.5 - 4.6 mg/dL   Albumin 1.8 (L) 3.5 - 5.0 g/dL   GFR calc non Af Amer >60 >60 mL/min   GFR calc Af Amer >60 >60 mL/min    Comment: (NOTE) The eGFR has been calculated using the CKD EPI equation. This calculation has not been validated in all clinical situations. eGFR's persistently <60 mL/min signify possible Chronic Kidney Disease.     Anion gap 5 5 - 15  Magnesium     Status: None   Collection Time: 07/17/15  4:15 AM  Result Value Ref Range   Magnesium 1.8 1.7 - 2.4 mg/dL  CBC     Status: Abnormal   Collection Time: 07/17/15  4:15 AM  Result Value Ref Range   WBC 12.2 (H) 4.0 - 10.5 K/uL   RBC 1.99 (L) 3.87 - 5.11 MIL/uL   Hemoglobin 7.7 (L) 12.0 - 15.0 g/dL   HCT 24.3 (L) 36.0 - 46.0 %   MCV 122.1 (H) 78.0 - 100.0 fL   MCH 38.7 (H) 26.0 - 34.0 pg   MCHC 31.7 30.0 - 36.0 g/dL   RDW 14.8 11.5 - 15.5 %   Platelets 203 150 - 400 K/uL  Glucose, capillary     Status: None   Collection Time: 07/17/15  7:26 AM  Result Value Ref Range   Glucose-Capillary 74 65 - 99 mg/dL  Glucose, capillary     Status: Abnormal   Collection Time: 07/17/15 11:56 AM  Result Value Ref Range   Glucose-Capillary 100 (H) 65 - 99 mg/dL    No results found.  Assessment/Plan: 69 y/o with multiple medical problems as delineated above, and reported progressive generalized weakness over the past year, now worsened to the point that she can not stand up and walk. Neuro-exam remarkable for significant proximal and distal weakness bilateral upper and lower extremities, bilateral inward feet deviation, areflexia in the lower extremities but rather brisk reflexes upper extremities. Patient had 10 days ICU stay, with concurrent use of steroids for alcoholic hepatitis, but no evidence of multiorgan failure, sepsis, or use of neuromuscular blocking agents. 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 The inward feet deviation is reportedly a new finding during this hospitalization and could be resulting from an entrapment neuropathy. I doubt a cervical cors process as the etiology of this patient's quadriparesis, and the temporal evolution is not quite consistent with an acute demyelinating neuropathy. Ordered MRI cervical spine, CK. EMG/NCV studies are not available in the hospital  but will be required to better elucidate patient syndrome.  Will follow up after testing.  Dorian Pod, MD 07/17/2015, 4:28 PM  [facility]

## 2015-07-17 NOTE — Progress Notes (Addendum)
Nutrition Follow-up  DOCUMENTATION CODES:   Non-severe (moderate) malnutrition in context of chronic illness  INTERVENTION:  - Continue Regular diet - Will order Ensure Enlive once/day which will provides 350 kcal and 20 grams of protein - RD will continue to monitor for needs  NUTRITION DIAGNOSIS:   Inadequate oral intake related to inability to eat as evidenced by NPO status. -resolving with recent diet advancement and 25-100% intakes  GOAL:   Patient will meet greater than or equal to 90% of their needs  MONITOR:   PO intake, Supplement acceptance, Weight trends, Labs  ASSESSMENT:   69 y.o. 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 Pt noted to ingest large amounts of ETOH on a daily basis. Pt has been recently trying to cut back ETOH intake but continues to drink. In the ED, alk phos noted to be elevated at 147 with ast of 287 and alt of 98 and total bili of 18.6. Ammonia noted to be 47. Head CT was unremarkable. Pt was started on CIWA and hospitalist consulted for admission.  Pt was extubated 8/7 and moved from ICU to [place] on 8/8. Diet advancement with 25% dinner 8/7 and 100% lunch and dinner recorded from yesterday (8/8).  Pt with some confusion per RN. Pt reports she had an omelet for breakfast and denies abdominal pain or nausea with eating. Pt likely meeting needs currently but will add Ensure Enlive once/day to supplement.  Medications reviewed. Labs reviewed; CBGs: 59-166 mg/dl, Cl: 113 mmol/L, creatinine low, Ca: 7.9 mg/dL.  ADDENDUM: Needs adjusted s/p extubation.  Diet Order:  Diet regular Room service appropriate?: Yes; Fluid consistency:: Thin  Skin:  Wound (see comment) (Stage II buttocks ulcer)  Last BM:  8/8  Height:   Ht Readings from Last 1 Encounters:  07/05/15 _0  (1.575 m)    Weight:   Wt Readings from Last 1 Encounters:  07/15/15  165 lb 12.6 oz (75.2 kg)    Ideal Body Weight:  50 kg  BMI:  Body mass index is 30.31 kg/(m^2).  Estimated Nutritional Needs:   Kcal:  1500-1700  Protein:  75-85 grams  Fluid:  1.5L/day  EDUCATION NEEDS:   No education needs identified at this time     Jarome Matin, RD, LDN Inpatient Clinical Dietitian Pager # [phone] After hours/weekend pager # [phone]

## 2015-07-17 NOTE — Progress Notes (Signed)
Physical Therapy Treatment Patient Details Name: PHEOBE SANDIFORD MRN: [id number] DOB: 10-Oct-1946 Today's Date: 07/17/2015    History of Present Illness 69 yo female admitted 7/26 with hepatic encephalopathy, VDRF, and ETOH withdrawal. Intubated 7/27 and extubated 07/15/15. Hx of ETOH abuse, PE, DVT, SVT, colitis.     PT Comments    Pt continues to display hypertonicity of bil LEs. Also noted bruising around lower legs and ankles bilaterally. Time spent working on positioning, stretching, ROM. Most attempts at ranging were met with increased resistance, even with tactile stimulation at times  R foot/ankle with slightly less tone compared to L. At this time, unable to safely attempt standing. Initiated WB on bil feet while sitting EOB. At end of session, pt seated with LEs dependent and blankets/pilllows under feet to encourage some WBing and flexed positioning of LEs.  Recommend neurology consult during hospital stay for any input that they can offer. Nursing can, and should, assist pt OOB<>recliner with lift equipment during hospital stay  Follow Up Recommendations  SNF (CIR does not appear to be an option)     Equipment Recommendations   (to be determined)    Recommendations for Other Services       Precautions / Restrictions Precautions Precautions: Fall Restrictions Weight Bearing Restrictions: No    Mobility  Bed Mobility Overal bed mobility: Needs Assistance Bed Mobility: Supine to Sit     Supine to sit: Total assist;+2 for physical assistance;+2 for safety/equipment;HOB elevated     General bed mobility comments: Assist for trunk and bil LEs. Utilized bedpad.  Transfers Overall transfer level: Needs assistance   Transfers: Lateral/Scoot Transfers          Lateral/Scoot Transfers: Total assist;+2 physical assistance;+2 safety/equipment General transfer comment: Lateral scoot from bed to drop arm recliner. Utilized bedpad. Multimodal cues for positioning of trunk.  Assist to manage LEs.   Ambulation/Gait             General Gait Details: NT-pt unable at this time   Stairs            Wheelchair Mobility    Modified Rankin (Stroke Patients Only)       Balance   Sitting-balance support: Feet supported Sitting balance-Leahy Scale: Fair Sitting balance - Comments: Sat EOB at least 10 minutes with level of assist eventually progressing to Min guard-static. Multimodal cues for trunk flexion/anterior weighshiting. Worked on LE WBing EOB with manual pressure applied down through distal quad area and dorsum of ankle to encourage DF and eversion of foot.                            Cognition Arousal/Alertness: Awake/alert Behavior During Therapy: Flat affect Overall Cognitive Status: Impaired/Different from baseline Area of Impairment: Memory;Following commands;Safety/judgement;Awareness;Problem solving     Memory: Decreased short-term memory Following Commands: Follows one step commands with increased time     Problem Solving: Slow processing;Decreased initiation;Requires tactile cues;Requires verbal cues;Difficulty sequencing      Exercises Other Exercises Other Exercises: passive stretching into eversion and DF bil ankles/feet. Attempted rotation of LEs to work toward lessening tone to allow for ROM. Noted increased tone with all attempts.     General Comments        Pertinent Vitals/Pain Pain Assessment: No/denies pain    Home Living                      Prior Function  PT Goals (current goals can now be found in the care plan section) Progress towards PT goals: Progressing toward goals    Frequency  Min 3X/week    PT Plan Current plan remains appropriate    Co-evaluation             End of Session   Activity Tolerance: Patient limited by fatigue Patient left: in chair;with call bell/phone within reach;with family/visitor present     Time: 4825-0037 PT Time Calculation  (min) (ACUTE ONLY): 32 min  Charges:  $Therapeutic Activity: 23-37 mins                    G Codes:      Weston Anna, MPT Pager: [phone]

## 2015-07-17 NOTE — Progress Notes (Signed)
Patient Demographics  Theresa [name], is a 69 y.o. female, DOB - 02/02/1946, SJG:[id number]  Admit date - 07/03/2015   Admitting Physician Donne Hazel, MD  Outpatient Primary MD for the patient is  Melinda Crutch, MD  LOS - 27   Chief Complaint  Patient presents with  . Altered Mental Status  . 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 Admitted 7/26 with hepatic encephalopathy, required intubation 7/27 given hepatic encephalopathy and alcohol withdrawals, been treated on steroids for alcohol hepatitis, patient successfully extubated/7/16.  Subjective:   Theresa [name] today has, No headache, No chest pain, No abdominal pain - No Nausea, , patient complains of profound weakness,   No Cough - SOB.   Assessment & Plan    Active Problems:   Acute respiratory failure with hypoxia   History of pulmonary embolism   Alcohol abuse   Alcohol withdrawal   Hepatic coma/encephalopathy   Hyperammonemia   Acute encephalopathy   Coagulopathy   Encephalopathy acute   Acute alcoholic hepatitis   Acute respiratory failure   Malnutrition of moderate degree   Pressure ulcer  Acute hypercarbic respiratory failure  - This is secondary to hepatic encephalopathy and alcohol withdrawal initially. - Patient intubated on 7/27, extubated on 8/7. - Currently on oxygen, wean as tolerated  Acute alcoholic hepatitis - Continue with steroids as recommended by GI, to complete total of 28 days(started 7/26) - check CMP in am.  Alcoholic liver disease - Continue with rifaximin, metoprolol, and lactulose  Hypertension - Continue with metoprolol  Alcohol abuse - continue with thiamine and folic acid. - In the hospital for last 14 days, do not anticipate any withdrawals.  History of DVT/PE - Patient is on Xarelto -  Patient developed left upper extremity superficial thrombosis at PICC line site, Caryl Comes was discontinued, patient is already on Xarelto  Anemia of chronic disease and critical illness - Monitor and transfuse as needed  Weakness - Patient was significant weakness post extubation, most likely related to ICU and steroids myopathy, continue with PT, consulted  neurology.  Severe protein calorie malnutrition - Nutritional status following  Code Status: Full  Family Communication: son and daughter at bedside  Disposition Plan: SNF when stable   Procedures  Important events during hospital stay.  7/26 Admit with altered mental status, hepatic encephalopathy. Recent UTI  7/27 Decompensation requiring intubation, PCCM consulted  7/27 steroids started for ETOH hepatitis  7/31 left UE swelling. Large superficial thrombus in arm of PICC 8/1 weaned all day. PICC replaced and left PICC removed. Heparin resumed.  8/2 weaning. Localizing to noxious stim. EEG done 8/4 more awake, following some commands. EEG: consistent with a generalized non-specific cerebral dysfunction(encephalopathy). There was no seizure or seizure predisposition recorded on this study.  8/5 making more improvement.  8/7 extubated over bougie as had no cuff leak from ETT.  8/8 awake, alert, following commands and appropriate. Tolerating diet, physical therapy started. Transferred to IM service and transferred to [place].   Consults   PCCM   Medications  Scheduled Meds: . antiseptic oral rinse  7 mL Mouth Rinse BID  . folic acid  1  mg Oral Daily  . lactulose  30 g Oral BID  . metoprolol tartrate  12.5 mg Oral BID  . multivitamin with minerals  1 tablet Oral Daily  . prednisoLONE  40 mg Oral Q breakfast  . rifaximin  550 mg Oral BID  . rivaroxaban  15 mg Oral Q breakfast  . sodium chloride  10-40 mL Intracatheter Q12H  . thiamine  100 mg Oral Daily   Continuous Infusions:  PRN Meds:.ALPRAZolam  DVT  Prophylaxis  Xarelto  Lab Results  Component Value Date   PLT 203 07/17/2015    Antibiotics   Anti-infectives    Start     Dose/Rate Route Frequency Ordered Stop   07/17/15 1000  rifaximin (XIFAXAN) tablet 550 mg     550 mg Oral 2 times daily 07/17/15 0910     07/15/15 1600  rifaximin (XIFAXAN) tablet 400 mg  Status:  Discontinued     400 mg Oral 3 times per day 07/15/15 1300 07/17/15 0909   07/06/15 0800  cefTRIAXone (ROCEPHIN) 2 g in dextrose 5 % 50 mL IVPB - Premix     2 g 100 mL/hr over 30 Minutes Intravenous Every 24 hours 07/05/15 0931 07/10/15 0814   07/04/15 1700  rifaximin (XIFAXAN) tablet 400 mg  Status:  Discontinued     400 mg Per Tube 3 times per day 07/04/15 1638 07/15/15 1300   07/04/15 0800  cefTRIAXone (ROCEPHIN) 1 g in dextrose 5 % 50 mL IVPB  Status:  Discontinued     1 g 100 mL/hr over 30 Minutes Intravenous Every 24 hours 07/04/15 0720 07/05/15 0931          Objective:   Filed Vitals:   07/16/15 0800 07/16/15 1143 07/16/15 1354 07/17/15 0500  BP: 147/118 97/74 101/46 115/44  Pulse: 95 95 84 72  Temp:  98 F (36.7 C) 98 F (36.7 C) 98.3 F (36.8 C)  TempSrc:  Oral Oral Oral  Resp: 20 18 18 18   Height:      Weight:      SpO2: 100% 100% 100% 100%    Wt Readings from Last 3 Encounters:  07/15/15 75.2 kg (165 lb 12.6 oz)  06/06/15 67.132 kg (148 lb)  05/24/15 68.493 kg (151 lb)     Intake/Output Summary (Last 24 hours) at 07/17/15 1424 Last data filed at 07/17/15 1000  Gross per 24 hour  Intake    140 ml  Output    875 ml  Net   -735 ml     Physical Exam  Awake Alert, Oriented, jaundiced Supple Neck,No JVD, No cervical lymphadenopathy appriciated.  Symmetrical Chest wall movement, Good air movement bilaterally,  RRR,No Gallops,Rubs or new Murmurs, No Parasternal Heave +ve B.Sounds, Abd Soft, No tenderness,  No Cyanosis, Clubbing or edema, significant upper and lower extremity weakness   Data Review   Micro Results No results  found for this or any previous visit (from the past 240 hour(s)).  Radiology Reports Ct Head Wo Contrast  07/08/2015   CLINICAL DATA:  Hepatic encephalopathy.  EXAM: CT HEAD WITHOUT CONTRAST  TECHNIQUE: Contiguous axial images were obtained from the base of the skull through the vertex without intravenous contrast.  COMPARISON:  Radiograph 07/03/2015  FINDINGS: No acute intracranial hemorrhage. No focal mass lesion. No CT evidence of acute infarction. No midline shift or mass effect. No hydrocephalus. Basilar cisterns are patent.  There is mild periventricular white matter hypodensities. Age-appropriate mild cortical atrophy.  Paranasal sinuses and  mastoid air  cells are clear.  IMPRESSION: 1. No acute intracranial findings. 2. Mild white matter microvascular disease.   Electronically Signed   By: Suzy Bouchard M.D.   On: 07/08/2015 09:05   Ct Head Wo Contrast  07/03/2015   CLINICAL DATA:  Altered mental status/disorientation  EXAM: CT HEAD WITHOUT CONTRAST  TECHNIQUE: Contiguous axial images were obtained from the base of the skull through the vertex without intravenous contrast.  COMPARISON:  May 12, 2015  FINDINGS: There is age related volume loss. There is no intracranial mass, hemorrhage, extra-axial fluid collection, or midline shift. There is relatively mild patchy small vessel disease in the centra semiovale bilaterally. There is no new gray-white compartment lesion. No acute infarct evident. Bony calvarium appears intact. Mastoid air cells are clear.  IMPRESSION: Age related volume loss with patchy periventricular small vessel disease. No intracranial mass, hemorrhage, or acute appearing infarct.   Electronically Signed   By: Lowella Grip III M.D.   On: 07/03/2015 13:54   US Abdomen Complete  07/04/2015   CLINICAL DATA:  Elevated liver function tests. Jaundice. Alcohol abuse. Prior cholecystectomy.  EXAM: ULTRASOUND ABDOMEN COMPLETE  COMPARISON:  11/09/2014  FINDINGS: Technically difficult  exam due to large patient habitus.  Gallbladder: Surgically absent.  Common bile duct: Diameter: 8 mm, within normal limits status post cholecystectomy.  Liver: Diffusely increased echogenicity of the hepatic parenchyma, consistent with diffuse hepatic steatosis. No focal mass lesion identified. No evidence of ascites.  IVC: Not visualized secondary to hepatic steatosis.  Pancreas: Not well visualized due to large habitus and overlying bowel gas.  Spleen: Size and appearance within normal limits.  Right Kidney: Length: 10.4 cm. Echogenicity within normal limits. No mass or hydronephrosis visualized.  Left Kidney: Length: 9.5 cm. Echogenicity within normal limits. No mass or hydronephrosis visualized.  Abdominal aorta: No aneurysm visualized.  Other findings: None.  IMPRESSION: Prior cholecystectomy. No evidence of biliary ductal dilatation or other acute findings.  Severe hepatic steatosis.  No evidence of hydronephrosis.   Electronically Signed   By: Earle Gell M.D.   On: 07/04/2015 21:56   Dg Chest Port 1 View  07/14/2015   CLINICAL DATA:  Acute respiratory failure. History of right-sided breast cancer. Former smoker.  EXAM: PORTABLE CHEST - 1 VIEW  COMPARISON:  07/12/2015 and 07/09/2005)  FINDINGS: 0524 hours. Endotracheal tube, nasogastric tube and right arm PICC appear unchanged. The heart size and mediastinal contours are stable. There is improved aeration of both lung bases with minimal residual left lower lobe atelectasis. There is no pleural effusion or pneumothorax. Postsurgical changes are noted within the right breast.  IMPRESSION: Improving bibasilar airspace opacities.  Stable support system.   Electronically Signed   By: Richardean Sale M.D.   On: 07/14/2015 09:10   Dg Chest Port 1 View  07/12/2015   CLINICAL DATA:  Respiratory failure.  EXAM: PORTABLE CHEST - 1 VIEW  COMPARISON:  07/10/2015 .  FINDINGS: Endotracheal tube, NG tube, right PICC line in stable position. Mediastinum and hilar  structures are stable. Low lung volumes with mild bibasilar atelectasis and/or infiltrates. No pleural effusion or pneumothorax. Heart size normal .  IMPRESSION: 1. Lines and tubes in stable position. 2. Low lung volumes with persistent subsegmental atelectasis. Persistent mild left base infiltrate.   Electronically Signed   By: Marcello Moores  Register   On: 07/12/2015 07:05   Dg Chest Port 1 View  07/10/2015   CLINICAL DATA:  Acute respiratory failure.  EXAM: PORTABLE CHEST - 1 VIEW  COMPARISON:  07/08/2015 .  FINDINGS: Interim removal left PICC line. Right PICC line present with tip at cavoatrial junction. Enteric tube stable position. Heart size stable. Chronic interstitial prominence. Low lung volumes with bibasilar atelectasis. Mild infiltrate left lung base cannot be excluded. Small left pleural effusion. No pneumothorax.  IMPRESSION: 1. Interim removal of left PICC line. Right PICC line has been placed. Its tip is at the cavoatrial junction. Endotracheal tube and NG tube in good anatomic position. 2. Low lung volumes with bibasilar atelectasis. Mild left base infiltrate cannot be excluded. Small left pleural effusion. 3. Chronic interstitial lung disease.   Electronically Signed   By: Marcello Moores  Register   On: 07/10/2015 07:03   Dg Chest Port 1 View  07/08/2015   CLINICAL DATA:  Acute respiratory failure  EXAM: PORTABLE CHEST - 1 VIEW  COMPARISON:  Radiograph 07/06/2015  FINDINGS: Patient rotated rightward. Endotracheal tube and NG tube are unchanged. LEFT PICC line noted. Bilateral small effusions.  IMPRESSION: No significant change.  Stable support apparatus.  Small effusions.   Electronically Signed   By: Suzy Bouchard M.D.   On: 07/08/2015 07:28   Dg Chest Port 1 View  07/06/2015   CLINICAL DATA:  Respiratory failure.  EXAM: PORTABLE CHEST - 1 VIEW  COMPARISON:  July 05, 2015.  FINDINGS: Stable cardiomediastinal silhouette. Endotracheal tube is in grossly good position with distal tip approximately 3 cm  above the carina. Nasogastric tube is seen entering the stomach. Left-sided PICC line is noted with distal tip overlying the expected position of the SVC. No pneumothorax is noted. Mild right basilar subsegmental atelectasis is noted. Stable left basilar opacity is noted concerning for pneumonia or atelectasis with associated pleural effusion. Bony thorax is intact.  IMPRESSION: Endotracheal and nasogastric tubes are in grossly good position. Mild right basilar subsegmental atelectasis noted. Stable larger left basilar opacity is noted concerning for pneumonia or atelectasis with associated effusion.   Electronically Signed   By: Marijo Conception, M.D.   On: 07/06/2015 07:24   Dg Chest Port 1 View  07/05/2015   CLINICAL DATA:  Check endotracheal tube position, respiratory failure  EXAM: PORTABLE CHEST - 1 VIEW  COMPARISON:  07/04/2015  FINDINGS: The endotracheal tube is again noted 2.4 cm above the carina. A nasogastric catheter is now seen within the stomach. The cardiac shadow is stable. Bibasilar atelectatic changes slightly worse on the left than the right are noted. No acute bony abnormality is seen.  IMPRESSION: Bibasilar atelectasis slightly worse on the left.  Tubes and lines as described.   Electronically Signed   By: Inez Catalina M.D.   On: 07/05/2015 07:34   Dg Chest Port 1 View  07/04/2015   CLINICAL DATA:  Hypoxia  EXAM: PORTABLE CHEST - 1 VIEW  COMPARISON:  May 12, 2015  FINDINGS: Endotracheal tube tip is 4.5 cm above the carina. There is atelectatic change in the lung bases. Lungs elsewhere clear. Heart size and pulmonary vascularity are normal. No adenopathy. There is atherosclerotic change in the aorta.  IMPRESSION: Bibasilar atelectasis. Endotracheal tube as described without pneumothorax.   Electronically Signed   By: Lowella Grip III M.D.   On: 07/04/2015 16:19     CBC  Recent Labs Lab 07/13/15 0519 07/14/15 0405 07/15/15 1200 07/16/15 0458 07/17/15 0415  WBC 16.9* 16.4*  19.2* 14.7* 12.2*  HGB 8.6* 7.3* 8.3* 7.6* 7.7*  HCT 26.1* 22.3* 25.6* 24.6* 24.3*  PLT 166 146* 207 188 203  MCV 124.9* 123.9*  122.5* 123.6* 122.1*  MCH 41.1* 40.6* 39.7* 38.2* 38.7*  MCHC 33.0 32.7 32.4 30.9 31.7  RDW 16.3* 15.7* 15.4 15.3 14.8    Chemistries   Recent Labs Lab 07/11/15 0440 07/12/15 0522 07/13/15 0519 07/14/15 0405 07/15/15 0540 07/16/15 0458 07/17/15 0415  NA 141 134* 138 142 142 142 143  K 4.4 4.0 4.0 4.1 4.2 3.8 3.9  CL 112* 106 109 114* 113* 112* 113*  CO2 26 24 24 24 24 25 25   GLUCOSE 114* 230* 105* 118* 144* 73 77  BUN 18 15 19  21* 22* 21* 16  CREATININE 0.41* 0.37* 0.42* 0.43* <0.30* 0.36* 0.42*  CALCIUM 7.7* 7.5* 7.8* 7.8* 8.2* 8.1* 7.9*  MG 2.0 1.8 1.9  --  1.9 1.7 1.8  AST 239*  --  249*  --  247*  --   --   ALT 143*  --  188*  --  225*  --   --   ALKPHOS 96  --  105  --  110  --   --   BILITOT 9.6*  --  9.5*  --  7.4*  --   --    ------------------------------------------------------------------------------------------------------------------ estimated creatinine clearance is 63 mL/min (by C-G formula based on Cr of 0.42). ------------------------------------------------------------------------------------------------------------------ No results for input(s): HGBA1C in the last 72 hours. ------------------------------------------------------------------------------------------------------------------ No results for input(s): CHOL, HDL, LDLCALC, TRIG, CHOLHDL, LDLDIRECT in the last 72 hours. ------------------------------------------------------------------------------------------------------------------ No results for input(s): TSH, T4TOTAL, T3FREE, THYROIDAB in the last 72 hours.  Invalid input(s): FREET3 ------------------------------------------------------------------------------------------------------------------ No results for input(s): VITAMINB12, FOLATE, FERRITIN, TIBC, IRON, RETICCTPCT in the last 72 hours.  Coagulation  profile  Recent Labs Lab 07/13/15 0345 07/14/15 0405 07/15/15 0540 07/16/15 0458 07/17/15 0415  INR 1.41 1.39 1.21 1.25 1.61*    No results for input(s): DDIMER in the last 72 hours.  Cardiac Enzymes No results for input(s): CKMB, TROPONINI, MYOGLOBIN in the last 168 hours.  Invalid input(s): CK ------------------------------------------------------------------------------------------------------------------ Invalid input(s): POCBNP     Time Spent in minutes   30 minutes   Jeray Shugart M.D on 07/17/2015 at 2:24 PM  Between 7am to 7pm - Pager - [phone]  After 7pm go to [email or web address] - password [email or web address]  [facility]   Office  [phone]

## 2015-07-17 NOTE — Clinical Social Work Placement (Signed)
   CLINICAL SOCIAL WORK PLACEMENT  NOTE  Date:  07/17/2015  Patient Details  Name: Theresa [name] MRN: [id number] Date of Birth: July 02, 1946  Clinical Social Work is seeking post-discharge placement for this patient at the [place] level of care (*CSW will initial, date and re-position this form in  chart as items are completed):  Yes   Patient/family provided with [place] Work Department's list of facilities offering this level of care within the geographic area requested by the patient (or if unable, by the patient's family).  Yes   Patient/family informed of their freedom to choose among providers that offer the needed level of care, that participate in Medicare, Medicaid or managed care program needed by the patient, have an available bed and are willing to accept the patient.  Yes   Patient/family informed of [facility]'s ownership interest in [facility] and [facility], as well as of the fact that they are under no obligation to receive care at these facilities.  PASRR submitted to EDS on 07/17/15     PASRR number received on 07/17/15     Existing PASRR number confirmed on       FL2 transmitted to all facilities in geographic area requested by pt/family on 07/17/15     FL2 transmitted to all facilities within larger geographic area on       Patient informed that his/her managed care company has contracts with or will negotiate with certain facilities, including the following:            Patient/family informed of bed offers received.  Patient chooses bed at       Physician recommends and patient chooses bed at      Patient to be transferred to   on  .  Patient to be transferred to facility by       Patient family notified on   of transfer.  Name of family member notified:        PHYSICIAN       Additional Comment:    _______________________________________________ Boone Master, [place] 07/17/2015, 2:05 PM

## 2015-07-17 NOTE — Clinical Social Work Note (Signed)
Clinical Social Work Assessment  Patient Details  Name: Theresa [name] MRN: [id number] Date of Birth: Mar 24, 1946  Date of referral:  07/17/15               Reason for consult:  Discharge Planning                Permission sought to share information with:  Family Supports Permission granted to share information::  Yes, Verbal Permission Granted  Name::     Higher education careers adviser::     Relationship::  spouse  Contact Information:     Housing/Transportation Living arrangements for the past 2 months:  Single Family Home Source of Information:  Patient, Spouse Patient Interpreter Needed:  None Criminal Activity/Legal Involvement Pertinent to Current Situation/Hospitalization:  No - Comment as needed Significant Relationships:  Spouse Lives with:  Spouse Do you feel safe going back to the place where you live?  No Need for family participation in patient care:  Yes (Comment)  Care giving concerns:  Husband reports patient cannot return home at this time due to deconditioning. Husband works during the day and cannot provide 24/7 care.   Social Worker assessment / plan:  CSW received referral in order to complete psychosocial assessment. CSW reviewed chart and met with patient at bedside. Patient confused and unable to fully participate so CSW spoke with spouse Delfino Lovett).  Patient was living at home prior to admission. Patient has been at the hospital for 2 weeks and required intubation. Patient is deconditioned due to long stay and intensity of care. Patient is requiring +2 assist and husband cannot manage needs at home. CSW provided SNF list and explained process. Patient has never stayed at [facility] but husband agreeable to review list and assist with choosing facility.  CSW completed FL2 and faxed out. CSW will follow up with bed offers.  Employment status:  Retired Health visitor, Managed Care PT Recommendations:  [place], [place] / Referral to community resources:  [place]  Patient/Family's Response to care:  Patient has flat affect and is disoriented. Husband engaged and appreciative of CSW assistance.  Patient/Family's Understanding of and Emotional Response to Diagnosis, Current Treatment, and Prognosis:  Husband reports he knows that hospitalization is related to alcohol use. Husband reports he is hopeful that patient will seek treatment once she is more stable mentally and physically.  Emotional Assessment Appearance:  Appears older than stated age Attitude/Demeanor/Rapport:  Lethargic Affect (typically observed):  Flat Orientation:  Oriented to Self Alcohol / Substance use:  Alcohol Use Psych involvement (Current and /or in the community):  No (Comment)  Discharge Needs  Concerns to be addressed:  No discharge needs identified Readmission within the last 30 days:  No Current discharge risk:  None Barriers to Discharge:  No Barriers Identified   Boone Master, [place] 07/17/2015, 2:01 PM [phone]

## 2015-07-17 NOTE — Progress Notes (Signed)
Clinical Social Work  CSW provided bed offers for [employer], [place] and [place], [facility], [place], [place]. Husband to tour facilities. CSW agreeable to provide further offers, if any, in the morning.  [place], [place] [phone]

## 2015-07-18 ENCOUNTER — Inpatient Hospital Stay (HOSPITAL_COMMUNITY): Payer: Medicare Other

## 2015-07-18 ENCOUNTER — Other Ambulatory Visit (HOSPITAL_COMMUNITY): Payer: Medicare Other

## 2015-07-18 LAB — PREPARE RBC (CROSSMATCH)

## 2015-07-18 LAB — COMPREHENSIVE METABOLIC PANEL
ALT: 245 U/L — AB (ref 14–54)
AST: 209 U/L — AB (ref 15–41)
Albumin: 1.9 g/dL — ABNORMAL LOW (ref 3.5–5.0)
Alkaline Phosphatase: 113 U/L (ref 38–126)
Anion gap: 8 (ref 5–15)
BILIRUBIN TOTAL: 6.1 mg/dL — AB (ref 0.3–1.2)
BUN: 17 mg/dL (ref 6–20)
CALCIUM: 8.2 mg/dL — AB (ref 8.9–10.3)
CO2: 23 mmol/L (ref 22–32)
Chloride: 112 mmol/L — ABNORMAL HIGH (ref 101–111)
Creatinine, Ser: 0.52 mg/dL (ref 0.44–1.00)
GFR calc Af Amer: >60 mL/min (ref >60)
GFR calc non Af Amer: >60 mL/min (ref >60)
Glucose, Bld: 77 mg/dL (ref 65–99)
Potassium: 3.5 mmol/L (ref 3.5–5.1)
SODIUM: 143 mmol/L (ref 135–145)
TOTAL PROTEIN: 5.3 g/dL — AB (ref 6.5–8.1)

## 2015-07-18 LAB — CBC
HCT: 25.2 % — ABNORMAL LOW (ref 36.0–46.0)
Hemoglobin: 7.7 g/dL — ABNORMAL LOW (ref 12.0–15.0)
MCH: 37.2 pg — ABNORMAL HIGH (ref 26.0–34.0)
MCHC: 30.6 g/dL (ref 30.0–36.0)
MCV: 121.7 fL — AB (ref 78.0–100.0)
PLATELETS: 228 10*3/uL (ref 150–400)
RBC: 2.07 MIL/uL — AB (ref 3.87–5.11)
RDW: 14.9 % (ref 11.5–15.5)
WBC: 11.9 10*3/uL — ABNORMAL HIGH (ref 4.0–10.5)

## 2015-07-18 LAB — GLUCOSE, CAPILLARY
GLUCOSE-CAPILLARY: 64 mg/dL — AB (ref 65–99)
GLUCOSE-CAPILLARY: 166 mg/dL — AB (ref 65–99)
Glucose-Capillary: 117 mg/dL — ABNORMAL HIGH (ref 65–99)
Glucose-Capillary: 112 mg/dL — ABNORMAL HIGH (ref 65–99)

## 2015-07-18 LAB — ABO/RH: ABO/RH(D): B NEG

## 2015-07-18 MED ORDER — SODIUM CHLORIDE 0.9 % IV SOLN
Freq: Once | INTRAVENOUS | Status: AC
  Administered 2015-07-18: 18:00:00 via INTRAVENOUS

## 2015-07-18 MED ORDER — FUROSEMIDE 40 MG PO TABS
40.0000 mg | ORAL_TABLET | Freq: Every day | ORAL | Status: DC
  Administered 2015-07-19: 40 mg via ORAL
  Filled 2015-07-18: qty 1

## 2015-07-18 MED ORDER — FUROSEMIDE 10 MG/ML IJ SOLN
40.0000 mg | Freq: Once | INTRAMUSCULAR | Status: AC
  Administered 2015-07-18: 40 mg via INTRAVENOUS
  Filled 2015-07-18: qty 4

## 2015-07-18 MED ORDER — POTASSIUM CHLORIDE CRYS ER 20 MEQ PO TBCR
40.0000 meq | EXTENDED_RELEASE_TABLET | Freq: Once | ORAL | Status: AC
  Administered 2015-07-18: 40 meq via ORAL
  Filled 2015-07-18: qty 2

## 2015-07-18 MED ORDER — HALOPERIDOL LACTATE 5 MG/ML IJ SOLN: 0.5000 mg | Freq: Four times a day (QID) | INTRAMUSCULAR | Status: DC | PRN

## 2015-07-18 NOTE — Progress Notes (Signed)
Physical Therapy Treatment Patient Details Name: VI BIDDINGER MRN: [id number] DOB: November 21, 1946 Today's Date: 07/18/2015    History of Present Illness 69 yo female admitted 7/26 with hepatic encephalopathy, VDRF, and ETOH withdrawal. Intubated 7/27 and extubated 07/15/15. Hx of ETOH abuse, PE, DVT, SVT, colitis.     PT Comments    Continuing to work on positioning, ROM, modified WBing through LEs. Hook-lying position with axial loading of limb to encourage knee flexion, DF, eversion. Sat EOB to work on sitting balance and WBing though LEs. L LE still with more tone than R. R ankle able to range to nearly neutral in WBing position but ankles almost immediately revert back to PF and inversion when unloaded.   Follow Up Recommendations  SNF     Equipment Recommendations       Recommendations for Other Services       Precautions / Restrictions Precautions Precautions: Fall Restrictions Weight Bearing Restrictions: No    Mobility  Bed Mobility Overal bed mobility: Needs Assistance Bed Mobility: Supine to Sit;Sit to Supine Rolling: Mod assist   Supine to sit: Total assist;+2 for physical assistance;+2 for safety/equipment Sit to supine: Total assist;+2 for physical assistance;+2 for safety/equipment   General bed mobility comments: Assist for trunk and bil LEs. Utilized bedpad.  Transfers                    Ambulation/Gait                 Stairs            Wheelchair Mobility    Modified Rankin (Stroke Patients Only)       Balance   Sitting-balance support: Bilateral upper extremity supported;Feet supported Sitting balance-Leahy Scale: Fair Sitting balance - Comments: Sat EOB at least 4minutes with level of assist eventually progressing to Min guard-static. Multimodal cues for trunk flexion/anterior weighshiting. Worked on LE WBing EOB with manual pressure applied down through distal quad area and dorsum of ankle to encourage DF and eversion  of foot.                            Cognition Arousal/Alertness: Awake/alert Behavior During Therapy: Flat affect Overall Cognitive Status: Impaired/Different from baseline Area of Impairment: Memory;Following commands;Safety/judgement;Awareness;Problem solving     Memory: Decreased short-term memory Following Commands: Follows one step commands with increased time     Problem Solving: Slow processing;Decreased initiation;Requires tactile cues;Requires verbal cues;Difficulty sequencing      Exercises      General Comments        Pertinent Vitals/Pain Pain Assessment: Faces Faces Pain Scale: Hurts little more Pain Location: lower legs/feet/ankles. Still note bruised areas. Pain Intervention(s): Monitored during session;Repositioned    Home Living                      Prior Function            PT Goals (current goals can now be found in the care plan section) Progress towards PT goals: Progressing toward goals (slowly)    Frequency  Min 3X/week    PT Plan Current plan remains appropriate    Co-evaluation             End of Session   Activity Tolerance: Patient tolerated treatment well Patient left: in bed;with call bell/phone within reach     Time: 1350-1411 PT Time Calculation (min) (ACUTE ONLY): 21 min  Charges:  $  Therapeutic Activity: 8-22 mins                    G Codes:      Weston Anna, MPT Pager: [phone]

## 2015-07-18 NOTE — Progress Notes (Signed)
   Called by [facility]. Patient for discharge in next 1-2 days and we were asked about Prednisolone taper. Patient should continue Prednisolone 40mg  daily until 08/05/15 (28 days). Following that she will need a rapid taper (2 weeks). Suggest:   30mg  x 4 days then 20mg  x 4 days then 10mg  x 4 days then 5mg  x 2 days then stop.  Follow up appt. Made with [place] for 9/1/at 9:15am (in computer). Call for further questions.  Thanks,  Tye Savoy, NP

## 2015-07-18 NOTE — Clinical Social Work Placement (Signed)
   CLINICAL SOCIAL WORK PLACEMENT  NOTE  Date:  07/18/2015  Patient Details  Name: NATHALIA WISMER MRN: [id number] Date of Birth: 1946-06-24  Clinical Social Work is seeking post-discharge placement for this patient at the [place] level of care (*CSW will initial, date and re-position this form in  chart as items are completed):  Yes   Patient/family provided with [place] Work Department's list of facilities offering this level of care within the geographic area requested by the patient (or if unable, by the patient's family).  Yes   Patient/family informed of their freedom to choose among providers that offer the needed level of care, that participate in Medicare, Medicaid or managed care program needed by the patient, have an available bed and are willing to accept the patient.  Yes   Patient/family informed of [facility]'s ownership interest in [facility] and [facility], as well as of the fact that they are under no obligation to receive care at these facilities.  PASRR submitted to EDS on 07/17/15     PASRR number received on 07/17/15     Existing PASRR number confirmed on       FL2 transmitted to all facilities in geographic area requested by pt/family on 07/17/15     FL2 transmitted to all facilities within larger geographic area on       Patient informed that his/her managed care company has contracts with or will negotiate with certain facilities, including the following:        Yes   Patient/family informed of bed offers received.  Patient chooses bed at [place], [place]     Physician recommends and patient chooses bed at      Patient to be transferred to   on  .  Patient to be transferred to facility by       Patient family notified on   of transfer.  Name of family member notified:        PHYSICIAN Please sign FL2     Additional Comment:    _______________________________________________ Alison Murray A, LCSW 07/18/2015, 1:40 PM

## 2015-07-18 NOTE — Progress Notes (Addendum)
CSW continuing to follow.   CSW met with pt and pt husband this morning and provided additional bed offers. Pt husband toured facility and followed up with CSW. CSW met with pt husband this afternoon and pt husband chooses bed at [facility] in [place] as facility is offering pt a private room.   MD called pt husband while CSW present and stated that pt not yet medically ready for discharge today, but anticipate discharge tomorrow.  CSW contacted [facility] and notified facility of acceptance of private room bed offer and anticipation for discharge tomorrow.  CSW to continue to follow to provide support and assist with pt discharge to [facility] when pt medically ready for discharge.  Sabir Charters, MSW, LCSW Clinical Social Work [phone] Coverage for Holly Gerber, LCSW 

## 2015-07-18 NOTE — Progress Notes (Signed)
NEURO HOSPITALIST PROGRESS NOTE   SUBJECTIVE:                                                                                                                        No new neurological developments. MRI cervical spine was independently reviewed and showed widespread cervical spine degeneration with widespread multifactorial borderline to mild spinal stenosis, but no cervical spinal cord mass effect or signal abnormality. CK 109  OBJECTIVE:                                                                                                                           Vital signs in last 24 hours: Temp:  [98 F (36.7 C)-98.1 F (36.7 C)] 98 F (36.7 C) (08/10 0514) Pulse Rate:  [70-78] 70 (08/10 0514) Resp:  [18] 18 (08/10 0514) BP: (108-115)/(61-68) 115/67 mmHg (08/10 0514) SpO2:  [98 %-99 %] 99 % (08/10 0514)  Intake/Output from previous day: 08/09 0701 - 08/10 0700 In: 10 [I.V.:10] Out: 129 [Urine:126; Stool:3] Intake/Output this shift: Total I/O In: -  Out: 1 [Urine:1] Nutritional status: Diet regular Room service appropriate?: Yes; Fluid consistency:: Thin  Past Medical History  Diagnosis Date  . Esophageal stricture   . HLD (hyperlipidemia)   . Dysthymic disorder   . Other and unspecified coagulation defects   . Diaphragmatic hernia without mention of obstruction or gangrene   . PAD (peripheral artery disease)   . Pulmonary embolism 06/2014    "both lungs"  . DVT (deep venous thrombosis) 06/2014    RLE  . GERD (gastroesophageal reflux disease)   . History of hiatal hernia   . Anxiety   . Malignant neoplasm of breast (female), unspecified site 2002    "right", NO BLOOD PRESSURES OR STICKS IN RIGHT ARM  . SVT (supraventricular tachycardia) 11/07/2014  . On home oxygen therapy     "2L at night and prn" (11/08/2014)  . Atherosclerosis   . Alcohol abuse   . Chronic respiratory failure   . Colitis     Physical exam: pleasant female in  no apparent distress.  Eyes: jaundice Head: normocephalic. Neck: supple, no bruits, no JVD. Cardiac: no murmurs. Lungs: clear. Abdomen: soft, no tender, no mass. Extremities: bilateral LE edema. Inwards  feet deviation. High arch feet. Skin: no rash  Neurologic Exam:  General: Mental Status: Alert, oriented, thought content appropriate. Speech fluent without evidence of aphasia. Able to follow 3 step commands without difficulty. Cranial Nerves: II: Discs flat bilaterally; Visual fields grossly normal, pupils equal, round, reactive to light and accommodation III,IV, VI: ptosis not present, extra-ocular motions intact bilaterally V,VII: smile symmetric, facial light touch sensation normal bilaterally VIII: hearing normal bilaterally IX,X: uvula rises symmetrically XI: bilateral shoulder shrug XII: midline tongue extension without atrophy or fasciculations Motor: There is 4/5 weakness proximally and distally in the upper extremities. 3/5 right and 4/5 left hip flexors, knee extensors 2/5, 0/5 foot evertors, 2/5 dorsiflexors. Tone and bulk:normal tone throughout; no atrophy noted Sensory: Pinprick and light touch intact throughout, bilaterally. Joint position sense impaired at the toes Deep Tendon Reflexes:  Brisk at the biceps and triceps, but areflexia at the patella and ankle bilaterally. Plantars: Right: downgoingLeft: downgoing Cerebellar: normal finger-to-nose, Patient with profound bilateral LE weakness that precludes heel-to-shin testing Gait: Patient is unable to stand up and walk, thus gait can not be assessed.   Lab Results: No results found for: CHOL Lipid Panel No results for input(s): CHOL, TRIG, HDL, CHOLHDL, VLDL, LDLCALC in the last 72 hours.  Studies/Results: Mr Cervical Spine Wo Contrast  07/18/2015   CLINICAL DATA:  69 year old female with indolent severe generalized weakness. Recent respiratory failure requiring ICU  admission, hepatic encephalopathy. Initial encounter.  EXAM: MRI CERVICAL SPINE WITHOUT CONTRAST  TECHNIQUE: Multiplanar, multisequence MR imaging of the cervical spine was performed. No intravenous contrast was administered.  COMPARISON:  Head CTs without contrast 07/08/2015 and earlier.  FINDINGS: Multilevel mild spondylolisthesis in the cervical and upper thoracic spine. Straightening of lordosis. No marrow edema or evidence of acute osseous abnormality.  Cervicomedullary junction is within normal limits. Grossly negative visualized posterior fossa brain parenchyma. No cervical or upper thoracic spinal cord signal abnormality.  Negative paraspinal soft tissues.  C2-C3: Central disc protrusion. Mild facet and ligament flavum hypertrophy. Borderline to mild spinal stenosis with no cord mass effect. No foraminal stenosis.  C3-C4: Mild anterolisthesis with moderate to severe facet hypertrophy on the left. Mild disc/pseudo disc bulge. Mild to moderate ligament flavum hypertrophy. Borderline to mild spinal stenosis with no cord mass effect. Severe left C4 foraminal stenosis.  C4-C5: Disc space loss mild retrolisthesis. Anterior eccentric circumferential disc osteophyte complex. Mild ligament flavum and facet hypertrophy. Borderline to mild spinal stenosis with no cord mass effect. Severe bilateral C5 foraminal stenosis.  C5-C6: Disc space loss a circumferential disc osteophyte complex. Mild ligament flavum hypertrophy. Borderline to mild spinal stenosis with no spinal cord mass effect. Uncovertebral hypertrophy. Severe left and moderate to severe right C6 foraminal stenosis.  C6-C7: Mild retrolisthesis. Disc space loss. Circumferential disc osteophyte complex. Mild ligament flavum hypertrophy. Uncovertebral hypertrophy. Borderline to mild spinal stenosis with no cord mass effect. Uncovertebral hypertrophy. Moderate left and moderate to severe right C7 foraminal stenosis.  C7-T1: Mild anterolisthesis. Moderate facet  hypertrophy greater on the right. Left uncovertebral hypertrophy. Mild left C8 foraminal stenosis.  No upper thoracic spinal stenosis.  IMPRESSION: 1. Widespread cervical spine degeneration with widespread multifactorial borderline to mild spinal stenosis, but no cervical spinal cord mass effect or signal abnormality. Negative visualized upper thoracic spinal cord. 2. Moderate or severe degenerative foraminal stenosis at the left C4, bilateral C5, bilateral C6, and bilateral C7 nerve levels.   Electronically Signed   By: Genevie Ann M.D.   On: 07/18/2015 11:17  MEDICATIONS                                                                                                                        Scheduled: . antiseptic oral rinse  7 mL Mouth Rinse BID  . folic acid  1 mg Oral Daily  . lactulose  30 g Oral BID  . metoprolol tartrate  12.5 mg Oral BID  . multivitamin with minerals  1 tablet Oral Daily  . prednisoLONE  40 mg Oral Q breakfast  . rifaximin  550 mg Oral BID  . rivaroxaban  15 mg Oral Q breakfast  . sodium chloride  10-40 mL Intracatheter Q12H  . thiamine  100 mg Oral Daily    ASSESSMENT/PLAN:                                                                                                           68 y/o with multiple medical problems as delineated above, and reported progressive generalized weakness over the past year, now worsened to the point that she can not stand up and walk. Neuro-exam remarkable for significant proximal and distal weakness bilateral upper and lower extremities, bilateral inward feet deviation, areflexia in the lower extremities but rather brisk reflexes upper extremities. MRI cervical spine without cord involvement. Patient had 10 days ICU stay, with concurrent use of steroids for alcoholic hepatitis, but no evidence of multiorgan failure, sepsis, or use of neuromuscular blocking agents. 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 The inward feet deviation is reportedly a new finding during this hospitalization and could be resulting from a bilateral entrapment neuropathy. Patient needs EMG/NCV but this test is not available in the hospital, thus will need to be performed as outpatient. Continue PT. Neuro will sign off.  Dorian Pod, MD [facility] [phone]  07/18/2015, 12:26 PM

## 2015-07-18 NOTE — Progress Notes (Signed)
.  Patient was ordered for MRI at 5:09pm.  Pt is alert not oriented, screened through husband. Pt is claustro needs .5 Ativan prior to MRI. Patient can be disconnected from IV's and Tele.    [facility] Tech spoke with [employer] 07/17/2015 and told her MRI would not be done until 07/18/15 am.

## 2015-07-18 NOTE — Progress Notes (Signed)
Patient Demographics  Theresa [name], is a 69 y.o. female, DOB - 04-20-1946, EGB:[id number]  Admit date - 07/03/2015   Admitting Physician Donne Hazel, MD  Outpatient Primary MD for the patient is  Melinda Crutch, MD  LOS - 15   Chief Complaint  Patient presents with  . Altered Mental Status  . 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 Admitted 7/26 with hepatic encephalopathy, required intubation 7/27 given hepatic encephalopathy and alcohol withdrawals, been treated on steroids for alcohol hepatitis, patient successfully extubated 8/7.  Subjective:   Theresa [name] today has, No headache, No chest pain, No abdominal pain - No Nausea, , patient complains of profound weakness,   No Cough - SOB.   Assessment & Plan    Active Problems:   Acute respiratory failure with hypoxia   History of pulmonary embolism   Alcohol abuse   Alcohol withdrawal   Hepatic coma/encephalopathy   Hyperammonemia   Acute encephalopathy   Coagulopathy   Encephalopathy acute   Acute alcoholic hepatitis   Acute respiratory failure   Malnutrition of moderate degree   Pressure ulcer  Acute respiratory failure  - This is secondary to hepatic encephalopathy and alcohol withdrawal initially. - Patient intubated on 7/27, extubated on 8/7. - Currently on oxygen, wean as tolerated  Acute alcoholic hepatitis - Continue with steroids as recommended by GI, to complete total of 28 days(started 7/26) - gradual improvement of LFTs. - requested GI follow up regarding steroids tapper.  Alcoholic liver disease - Continue with rifaximin, metoprolol, and lactulose, will start on lasix.  Hypertension - Continue with metoprolol  Alcohol abuse - continue with thiamine and folic acid. - admitted on 7/26   , do not anticipate any  withdrawals.  History of DVT/PE - Patient is on Xarelto - Patient developed left upper extremity superficial thrombosis at PICC line site, PICC line  was discontinued, patient is already on Xarelto  Anemia of chronic disease and critical illness - Monitor , will transfuse 1 unit packed red blood cells today giving her symptomatic anemia and profound weakness.  Weakness - patient  with significant proximal and distal weakness in all 4 extremities, bilateral and would free deviation, reflex area and lower, hyperreflexia and upper extremities. - Neurology consult greatly appreciated, MRI cervical spine without cord involvement. 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 - Patient will need EMG with nerve conduction study for further assessment (done as an outpatient only), has been arranged with [facility], on 8/18 at 2:30 PM .  - Continue with PT/OT  Sever  protein calorie malnutrition - Nutritional consult  Following, continue with supplements  Code Status: Full  Family Communication: son and daughter at bedside  Disposition Plan: SNF when stable   Procedures  Important events during hospital stay.  7/26 Admit with altered mental status, hepatic encephalopathy. Recent UTI  7/27 Decompensation requiring intubation, PCCM consulted  7/27 steroids started for ETOH hepatitis  7/31 left UE swelling. Large superficial thrombus in arm of  PICC 8/1 weaned all day. PICC replaced and left PICC removed. Heparin resumed.  8/2 weaning. Localizing to noxious stim. EEG done 8/4 more awake, following some commands. EEG: consistent with a generalized non-specific cerebral dysfunction(encephalopathy). There was no seizure or seizure predisposition recorded on this study.  8/5 making more improvement.  8/7  extubated over bougie as had no cuff leak from ETT.  8/8 awake, alert, following commands and appropriate. Tolerating diet, physical therapy started. Transferred to IM service and transferred to [facility].   Consults   PCCM   Medications  Scheduled Meds: . sodium chloride   Intravenous Once  . antiseptic oral rinse  7 mL Mouth Rinse BID  . folic acid  1 mg Oral Daily  . furosemide  40 mg Intravenous Once  . [START ON 07/19/2015] furosemide  40 mg Oral Daily  . lactulose  30 g Oral BID  . metoprolol tartrate  12.5 mg Oral BID  . multivitamin with minerals  1 tablet Oral Daily  . prednisoLONE  40 mg Oral Q breakfast  . rifaximin  550 mg Oral BID  . rivaroxaban  15 mg Oral Q breakfast  . sodium chloride  10-40 mL Intracatheter Q12H  . thiamine  100 mg Oral Daily   Continuous Infusions:  PRN Meds:.ALPRAZolam  DVT Prophylaxis  Xarelto  Lab Results  Component Value Date   PLT 228 07/18/2015    Antibiotics   Anti-infectives    Start     Dose/Rate Route Frequency Ordered Stop   07/17/15 1000  rifaximin (XIFAXAN) tablet 550 mg     550 mg Oral 2 times daily 07/17/15 0910     07/15/15 1600  rifaximin (XIFAXAN) tablet 400 mg  Status:  Discontinued     400 mg Oral 3 times per day 07/15/15 1300 07/17/15 0909   07/06/15 0800  cefTRIAXone (ROCEPHIN) 2 g in dextrose 5 % 50 mL IVPB - Premix     2 g 100 mL/hr over 30 Minutes Intravenous Every 24 hours 07/05/15 0931 07/10/15 0814   07/04/15 1700  rifaximin (XIFAXAN) tablet 400 mg  Status:  Discontinued     400 mg Per Tube 3 times per day 07/04/15 1638 07/15/15 1300   07/04/15 0800  cefTRIAXone (ROCEPHIN) 1 g in dextrose 5 % 50 mL IVPB  Status:  Discontinued     1 g 100 mL/hr over 30 Minutes Intravenous Every 24 hours 07/04/15 0720 07/05/15 0931          Objective:   Filed Vitals:   07/17/15 0500 07/17/15 1426 07/17/15 2059 07/18/15 0514  BP: 115/44 108/61 115/68 115/67  Pulse: 72 78 71 70  Temp: 98.3 F (36.8 C) 98 F (36.7  C) 98.1 F (36.7 C) 98 F (36.7 C)  TempSrc: Oral Oral Oral Oral  Resp: 18 18 18 18   Height:      Weight:      SpO2: 100% 98% 98% 99%    Wt Readings from Last 3 Encounters:  07/15/15 75.2 kg (165 lb 12.6 oz)  06/06/15 67.132 kg (148 lb)  05/24/15 68.493 kg (151 lb)     Intake/Output Summary (Last 24 hours) at 07/18/15 1315 Last data filed at 07/18/15 0915  Gross per 24 hour  Intake     10 ml  Output      5 ml  Net      5 ml     Physical Exam  Awake Alert, Oriented, jaundiced Supple Neck,No JVD, No cervical lymphadenopathy appriciated.  Symmetrical  Chest wall movement, Good air movement bilaterally,  RRR,No Gallops,Rubs or new Murmurs, No Parasternal Heave +ve B.Sounds, Abd Soft, No tenderness,  No Cyanosis, Clubbing ,  feet inverted in ward, mild pitting edema edema, significant upper and lower extremity weakness   Data Review   Micro Results No results found for this or any previous visit (from the past 240 hour(s)).  Radiology Reports Ct Head Wo Contrast  07/08/2015   CLINICAL DATA:  Hepatic encephalopathy.  EXAM: CT HEAD WITHOUT CONTRAST  TECHNIQUE: Contiguous axial images were obtained from the base of the skull through the vertex without intravenous contrast.  COMPARISON:  Radiograph 07/03/2015  FINDINGS: No acute intracranial hemorrhage. No focal mass lesion. No CT evidence of acute infarction. No midline shift or mass effect. No hydrocephalus. Basilar cisterns are patent.  There is mild periventricular white matter hypodensities. Age-appropriate mild cortical atrophy.  Paranasal sinuses and  mastoid air cells are clear.  IMPRESSION: 1. No acute intracranial findings. 2. Mild white matter microvascular disease.   Electronically Signed   By: Suzy Bouchard M.D.   On: 07/08/2015 09:05   Ct Head Wo Contrast  07/03/2015   CLINICAL DATA:  Altered mental status/disorientation  EXAM: CT HEAD WITHOUT CONTRAST  TECHNIQUE: Contiguous axial images were obtained from the  base of the skull through the vertex without intravenous contrast.  COMPARISON:  May 12, 2015  FINDINGS: There is age related volume loss. There is no intracranial mass, hemorrhage, extra-axial fluid collection, or midline shift. There is relatively mild patchy small vessel disease in the centra semiovale bilaterally. There is no new gray-white compartment lesion. No acute infarct evident. Bony calvarium appears intact. Mastoid air cells are clear.  IMPRESSION: Age related volume loss with patchy periventricular small vessel disease. No intracranial mass, hemorrhage, or acute appearing infarct.   Electronically Signed   By: Lowella Grip III M.D.   On: 07/03/2015 13:54   Mr Cervical Spine Wo Contrast  07/18/2015   CLINICAL DATA:  69 year old female with indolent severe generalized weakness. Recent respiratory failure requiring ICU admission, hepatic encephalopathy. Initial encounter.  EXAM: MRI CERVICAL SPINE WITHOUT CONTRAST  TECHNIQUE: Multiplanar, multisequence MR imaging of the cervical spine was performed. No intravenous contrast was administered.  COMPARISON:  Head CTs without contrast 07/08/2015 and earlier.  FINDINGS: Multilevel mild spondylolisthesis in the cervical and upper thoracic spine. Straightening of lordosis. No marrow edema or evidence of acute osseous abnormality.  Cervicomedullary junction is within normal limits. Grossly negative visualized posterior fossa brain parenchyma. No cervical or upper thoracic spinal cord signal abnormality.  Negative paraspinal soft tissues.  C2-C3: Central disc protrusion. Mild facet and ligament flavum hypertrophy. Borderline to mild spinal stenosis with no cord mass effect. No foraminal stenosis.  C3-C4: Mild anterolisthesis with moderate to severe facet hypertrophy on the left. Mild disc/pseudo disc bulge. Mild to moderate ligament flavum hypertrophy. Borderline to mild spinal stenosis with no cord mass effect. Severe left C4 foraminal stenosis.  C4-C5:  Disc space loss mild retrolisthesis. Anterior eccentric circumferential disc osteophyte complex. Mild ligament flavum and facet hypertrophy. Borderline to mild spinal stenosis with no cord mass effect. Severe bilateral C5 foraminal stenosis.  C5-C6: Disc space loss a circumferential disc osteophyte complex. Mild ligament flavum hypertrophy. Borderline to mild spinal stenosis with no spinal cord mass effect. Uncovertebral hypertrophy. Severe left and moderate to severe right C6 foraminal stenosis.  C6-C7: Mild retrolisthesis. Disc space loss. Circumferential disc osteophyte complex. Mild ligament flavum hypertrophy. Uncovertebral hypertrophy. Borderline to mild spinal stenosis  with no cord mass effect. Uncovertebral hypertrophy. Moderate left and moderate to severe right C7 foraminal stenosis.  C7-T1: Mild anterolisthesis. Moderate facet hypertrophy greater on the right. Left uncovertebral hypertrophy. Mild left C8 foraminal stenosis.  No upper thoracic spinal stenosis.  IMPRESSION: 1. Widespread cervical spine degeneration with widespread multifactorial borderline to mild spinal stenosis, but no cervical spinal cord mass effect or signal abnormality. Negative visualized upper thoracic spinal cord. 2. Moderate or severe degenerative foraminal stenosis at the left C4, bilateral C5, bilateral C6, and bilateral C7 nerve levels.   Electronically Signed   By: Genevie Ann M.D.   On: 07/18/2015 11:17   US Abdomen Complete  07/04/2015   CLINICAL DATA:  Elevated liver function tests. Jaundice. Alcohol abuse. Prior cholecystectomy.  EXAM: ULTRASOUND ABDOMEN COMPLETE  COMPARISON:  11/09/2014  FINDINGS: Technically difficult exam due to large patient habitus.  Gallbladder: Surgically absent.  Common bile duct: Diameter: 8 mm, within normal limits status post cholecystectomy.  Liver: Diffusely increased echogenicity of the hepatic parenchyma, consistent with diffuse hepatic steatosis. No focal mass lesion identified. No evidence  of ascites.  IVC: Not visualized secondary to hepatic steatosis.  Pancreas: Not well visualized due to large habitus and overlying bowel gas.  Spleen: Size and appearance within normal limits.  Right Kidney: Length: 10.4 cm. Echogenicity within normal limits. No mass or hydronephrosis visualized.  Left Kidney: Length: 9.5 cm. Echogenicity within normal limits. No mass or hydronephrosis visualized.  Abdominal aorta: No aneurysm visualized.  Other findings: None.  IMPRESSION: Prior cholecystectomy. No evidence of biliary ductal dilatation or other acute findings.  Severe hepatic steatosis.  No evidence of hydronephrosis.   Electronically Signed   By: Earle Gell M.D.   On: 07/04/2015 21:56   Dg Chest Port 1 View  07/14/2015   CLINICAL DATA:  Acute respiratory failure. History of right-sided breast cancer. Former smoker.  EXAM: PORTABLE CHEST - 1 VIEW  COMPARISON:  07/12/2015 and 07/09/2005)  FINDINGS: 0524 hours. Endotracheal tube, nasogastric tube and right arm PICC appear unchanged. The heart size and mediastinal contours are stable. There is improved aeration of both lung bases with minimal residual left lower lobe atelectasis. There is no pleural effusion or pneumothorax. Postsurgical changes are noted within the right breast.  IMPRESSION: Improving bibasilar airspace opacities.  Stable support system.   Electronically Signed   By: Richardean Sale M.D.   On: 07/14/2015 09:10   Dg Chest Port 1 View  07/12/2015   CLINICAL DATA:  Respiratory failure.  EXAM: PORTABLE CHEST - 1 VIEW  COMPARISON:  07/10/2015 .  FINDINGS: Endotracheal tube, NG tube, right PICC line in stable position. Mediastinum and hilar structures are stable. Low lung volumes with mild bibasilar atelectasis and/or infiltrates. No pleural effusion or pneumothorax. Heart size normal .  IMPRESSION: 1. Lines and tubes in stable position. 2. Low lung volumes with persistent subsegmental atelectasis. Persistent mild left base infiltrate.    Electronically Signed   By: Marcello Moores  Register   On: 07/12/2015 07:05   Dg Chest Port 1 View  07/10/2015   CLINICAL DATA:  Acute respiratory failure.  EXAM: PORTABLE CHEST - 1 VIEW  COMPARISON:  07/08/2015 .  FINDINGS: Interim removal left PICC line. Right PICC line present with tip at cavoatrial junction. Enteric tube stable position. Heart size stable. Chronic interstitial prominence. Low lung volumes with bibasilar atelectasis. Mild infiltrate left lung base cannot be excluded. Small left pleural effusion. No pneumothorax.  IMPRESSION: 1. Interim removal of left PICC line. Right  PICC line has been placed. Its tip is at the cavoatrial junction. Endotracheal tube and NG tube in good anatomic position. 2. Low lung volumes with bibasilar atelectasis. Mild left base infiltrate cannot be excluded. Small left pleural effusion. 3. Chronic interstitial lung disease.   Electronically Signed   By: Marcello Moores  Register   On: 07/10/2015 07:03   Dg Chest Port 1 View  07/08/2015   CLINICAL DATA:  Acute respiratory failure  EXAM: PORTABLE CHEST - 1 VIEW  COMPARISON:  Radiograph 07/06/2015  FINDINGS: Patient rotated rightward. Endotracheal tube and NG tube are unchanged. LEFT PICC line noted. Bilateral small effusions.  IMPRESSION: No significant change.  Stable support apparatus.  Small effusions.   Electronically Signed   By: Suzy Bouchard M.D.   On: 07/08/2015 07:28   Dg Chest Port 1 View  07/06/2015   CLINICAL DATA:  Respiratory failure.  EXAM: PORTABLE CHEST - 1 VIEW  COMPARISON:  July 05, 2015.  FINDINGS: Stable cardiomediastinal silhouette. Endotracheal tube is in grossly good position with distal tip approximately 3 cm above the carina. Nasogastric tube is seen entering the stomach. Left-sided PICC line is noted with distal tip overlying the expected position of the SVC. No pneumothorax is noted. Mild right basilar subsegmental atelectasis is noted. Stable left basilar opacity is noted concerning for pneumonia or  atelectasis with associated pleural effusion. Bony thorax is intact.  IMPRESSION: Endotracheal and nasogastric tubes are in grossly good position. Mild right basilar subsegmental atelectasis noted. Stable larger left basilar opacity is noted concerning for pneumonia or atelectasis with associated effusion.   Electronically Signed   By: Marijo Conception, M.D.   On: 07/06/2015 07:24   Dg Chest Port 1 View  07/05/2015   CLINICAL DATA:  Check endotracheal tube position, respiratory failure  EXAM: PORTABLE CHEST - 1 VIEW  COMPARISON:  07/04/2015  FINDINGS: The endotracheal tube is again noted 2.4 cm above the carina. A nasogastric catheter is now seen within the stomach. The cardiac shadow is stable. Bibasilar atelectatic changes slightly worse on the left than the right are noted. No acute bony abnormality is seen.  IMPRESSION: Bibasilar atelectasis slightly worse on the left.  Tubes and lines as described.   Electronically Signed   By: Inez Catalina M.D.   On: 07/05/2015 07:34   Dg Chest Port 1 View  07/04/2015   CLINICAL DATA:  Hypoxia  EXAM: PORTABLE CHEST - 1 VIEW  COMPARISON:  May 12, 2015  FINDINGS: Endotracheal tube tip is 4.5 cm above the carina. There is atelectatic change in the lung bases. Lungs elsewhere clear. Heart size and pulmonary vascularity are normal. No adenopathy. There is atherosclerotic change in the aorta.  IMPRESSION: Bibasilar atelectasis. Endotracheal tube as described without pneumothorax.   Electronically Signed   By: Lowella Grip III M.D.   On: 07/04/2015 16:19     CBC  Recent Labs Lab 07/14/15 0405 07/15/15 1200 07/16/15 0458 07/17/15 0415 07/18/15 0500  WBC 16.4* 19.2* 14.7* 12.2* 11.9*  HGB 7.3* 8.3* 7.6* 7.7* 7.7*  HCT 22.3* 25.6* 24.6* 24.3* 25.2*  PLT 146* 207 188 203 228  MCV 123.9* 122.5* 123.6* 122.1* 121.7*  MCH 40.6* 39.7* 38.2* 38.7* 37.2*  MCHC 32.7 32.4 30.9 31.7 30.6  RDW 15.7* 15.4 15.3 14.8 14.9    Chemistries   Recent Labs Lab  07/12/15 0522 07/13/15 0519 07/14/15 0405 07/15/15 0540 07/16/15 0458 07/17/15 0415 07/18/15 0500  NA 134* 138 142 142 142 143 143  K 4.0 4.0  4.1 4.2 3.8 3.9 3.5  CL 106 109 114* 113* 112* 113* 112*  CO2 24 24 24 24 25 25 23   GLUCOSE 230* 105* 118* 144* 73 77 77  BUN 15 19 21* 22* 21* 16 17  CREATININE 0.37* 0.42* 0.43* <0.30* 0.36* 0.42* 0.52  CALCIUM 7.5* 7.8* 7.8* 8.2* 8.1* 7.9* 8.2*  MG 1.8 1.9  --  1.9 1.7 1.8  --   AST  --  249*  --  247*  --   --  209*  ALT  --  188*  --  225*  --   --  245*  ALKPHOS  --  105  --  110  --   --  113  BILITOT  --  9.5*  --  7.4*  --   --  6.1*   ------------------------------------------------------------------------------------------------------------------ estimated creatinine clearance is 63 mL/min (by C-G formula based on Cr of 0.52). ------------------------------------------------------------------------------------------------------------------ No results for input(s): HGBA1C in the last 72 hours. ------------------------------------------------------------------------------------------------------------------ No results for input(s): CHOL, HDL, LDLCALC, TRIG, CHOLHDL, LDLDIRECT in the last 72 hours. ------------------------------------------------------------------------------------------------------------------ No results for input(s): TSH, T4TOTAL, T3FREE, THYROIDAB in the last 72 hours.  Invalid input(s): FREET3 ------------------------------------------------------------------------------------------------------------------ No results for input(s): VITAMINB12, FOLATE, FERRITIN, TIBC, IRON, RETICCTPCT in the last 72 hours.  Coagulation profile  Recent Labs Lab 07/13/15 0345 07/14/15 0405 07/15/15 0540 07/16/15 0458 07/17/15 0415  INR 1.41 1.39 1.21 1.25 1.61*    No results for input(s): DDIMER in the last 72 hours.  Cardiac Enzymes No results for input(s): CKMB, TROPONINI, MYOGLOBIN in the last 168 hours.  Invalid  input(s): CK ------------------------------------------------------------------------------------------------------------------ Invalid input(s): POCBNP     Time Spent in minutes   30 minutes   Charlie Seda M.D on 07/18/2015 at 1:15 PM  Between 7am to 7pm - Pager - [phone]  After 7pm go to [email or web address] - password [facility]  [facility]   Office  [phone]

## 2015-07-19 DIAGNOSIS — J9601 Acute respiratory failure with hypoxia: Secondary | ICD-10-CM

## 2015-07-19 LAB — TYPE AND SCREEN
ABO/RH(D): B NEG
Antibody Screen: NEGATIVE
UNIT DIVISION: 0

## 2015-07-19 LAB — VITAMIN B12: VITAMIN B 12: 829 pg/mL (ref 180–914)

## 2015-07-19 LAB — HEMOGLOBIN AND HEMATOCRIT, BLOOD
HCT: 30.6 % — ABNORMAL LOW (ref 36.0–46.0)
Hemoglobin: 10 g/dL — ABNORMAL LOW (ref 12.0–15.0)

## 2015-07-19 LAB — GLUCOSE, CAPILLARY
GLUCOSE-CAPILLARY: 109 mg/dL — AB (ref 65–99)
Glucose-Capillary: 73 mg/dL (ref 65–99)

## 2015-07-19 LAB — FOLATE: Folate: 16.5 ng/mL (ref >5.9)

## 2015-07-19 MED ORDER — RIFAXIMIN 550 MG PO TABS: 550.0000 mg | ORAL_TABLET | Freq: Two times a day (BID) | ORAL | Status: AC

## 2015-07-19 MED ORDER — PREDNISOLONE 15 MG/5ML PO SOLN: ORAL | Status: AC

## 2015-07-19 MED ORDER — ALPRAZOLAM 0.25 MG PO TABS: 0.2500 mg | ORAL_TABLET | Freq: Every evening | ORAL | Status: AC | PRN

## 2015-07-19 MED ORDER — THIAMINE HCL 100 MG PO TABS: 100.0000 mg | ORAL_TABLET | Freq: Every day | ORAL | Status: AC

## 2015-07-19 MED ORDER — RIVAROXABAN 15 MG PO TABS: 15.0000 mg | ORAL_TABLET | Freq: Every day | ORAL | Status: AC

## 2015-07-19 MED ORDER — FOLIC ACID 1 MG PO TABS: 1.0000 mg | ORAL_TABLET | Freq: Every day | ORAL | Status: AC

## 2015-07-19 MED ORDER — FUROSEMIDE 40 MG PO TABS: 20.0000 mg | ORAL_TABLET | Freq: Every day | ORAL | Status: AC

## 2015-07-19 MED ORDER — LACTULOSE 10 GM/15ML PO SOLN: 20.0000 g | Freq: Two times a day (BID) | ORAL | Status: AC

## 2015-07-19 MED ORDER — METOPROLOL TARTRATE 25 MG PO TABS: 12.5000 mg | ORAL_TABLET | Freq: Two times a day (BID) | ORAL | Status: AC

## 2015-07-19 MED ORDER — ADULT MULTIVITAMIN W/MINERALS CH: 1.0000 | ORAL_TABLET | Freq: Every day | ORAL | Status: AC

## 2015-07-19 NOTE — Progress Notes (Signed)
CSW continuing to follow.   CSW received notification this morning that pt husband requesting to speak with CSW. CSW met with pt husband in hallway. Pt husband stated that after speaking with his son that pt husband learned that pt daughter-in-law knew someone at [facility] in [place] and heard good feedback from the facility and pt husband wants to explore if [facility] is an option.   CSW contacted [facility] and sent facility pt clinicals. CSW received notification that [facility] can offer pt a private room for today. CSW notified pt husband and pt husband went to tour facility. Pt husband contacted this CSW and notified that he would like for pt to go to [facility] rather than [facility].   CSW contacted [facility] to notify and confirm that facility could accept pt today. CSW notified [facility] that pt will no longer be coming to their facility.   CSW facilitated pt discharge needs including contacting [facility], faxing pt discharge information via [facility], discussing with pt and pt husband at bedside, providing RN phone number to call report, and arranging ambulance transport for pt to [facility].   Pt and pt husband appreciative of CSW assistance. Pt husband feels that pt will be happier at [facility].   No further social work needs identified at this time.  CSW signing off.   Alison Murray, MSW, [facility] [phone]

## 2015-07-19 NOTE — Clinical Social Work Placement (Signed)
   CLINICAL SOCIAL WORK PLACEMENT  NOTE  Date:  07/19/2015  Patient Details  Name: Theresa [name] MRN: [id number] Date of Birth: 03/22/1946  Clinical Social Work is seeking post-discharge placement for this patient at the [place] level of care (*CSW will initial, date and re-position this form in  chart as items are completed):  Yes   Patient/family provided with [facility] Department's list of facilities offering this level of care within the geographic area requested by the patient (or if unable, by the patient's family).  Yes   Patient/family informed of their freedom to choose among providers that offer the needed level of care, that participate in Medicare, Medicaid or managed care program needed by the patient, have an available bed and are willing to accept the patient.  Yes   Patient/family informed of [facility]'s ownership interest in [facility] and [facility], as well as of the fact that they are under no obligation to receive care at these facilities.  PASRR submitted to EDS on 07/17/15     PASRR number received on 07/17/15     Existing PASRR number confirmed on       FL2 transmitted to all facilities in geographic area requested by pt/family on 07/17/15     FL2 transmitted to all facilities within larger geographic area on 07/19/15     Patient informed that his/her managed care company has contracts with or will negotiate with certain facilities, including the following:        Yes   Patient/family informed of bed offers received.  Patient chooses bed at [facility], [facility]     Physician recommends and patient chooses bed at      Patient to be transferred to [facility] on 07/19/15.  Patient to be transferred to facility by ambulance [facility])     Patient family notified on 07/19/15 of transfer.  Name of family member notified:  pt and pt husband, Huma Imhoff notified at bedside     PHYSICIAN Please  sign FL2     Additional Comment:    _______________________________________________ Ladell Pier, LCSW 07/19/2015, 2:17 PM

## 2015-07-19 NOTE — Discharge Summary (Signed)
Theresa [name], is a 69 y.o. female  DOB [birth year]  MRN [id number].  Admission date:  07/03/2015  Admitting Physician  Donne Hazel, MD  Discharge Date:  07/19/2015   Primary MD   Melinda Crutch, MD  Recommendations for primary care physician for things to follow:  - Please check CBC, CMP in 48 hours. - To be seen by physician at SNF in 48 hours. - Please keep [place] scheduled appointment with GI and urology as an outpatient. - Please adjust lactulose dose as needed to keep bowel movement 3-4 per day. - Please follow on B-12/folic acid level sent during hospital stay, results are pending. - Please continue with prednisolone taper as recommended and discharge summary. -  to continue 2 L nasal cannula on as-needed basis.  Admission Diagnosis  Acute encephalopathy [G93.40]   Discharge Diagnosis  Acute encephalopathy [G93.40]    Active Problems:   Acute respiratory failure with hypoxia   History of pulmonary embolism   Alcohol abuse   Alcohol withdrawal   Hepatic coma/encephalopathy   Hyperammonemia   Acute encephalopathy   Coagulopathy   Encephalopathy acute   Acute alcoholic hepatitis   Acute respiratory failure   Malnutrition of moderate degree   Pressure ulcer      Past Medical History  Diagnosis Date  . Esophageal stricture   . HLD (hyperlipidemia)   . Dysthymic disorder   . Other and unspecified coagulation defects   . Diaphragmatic hernia without mention of obstruction or gangrene   . PAD (peripheral artery disease)   . Pulmonary embolism 06/2014    "both lungs"  . DVT (deep venous thrombosis) 06/2014    RLE  . GERD (gastroesophageal reflux disease)   . History of hiatal hernia   . Anxiety   . Malignant neoplasm of breast (female), unspecified site 2002    "right", NO BLOOD PRESSURES OR STICKS IN RIGHT ARM  . SVT (supraventricular tachycardia) 11/07/2014  . On home oxygen  therapy     "2L at night and prn" (11/08/2014)  . Atherosclerosis   . Alcohol abuse   . Chronic respiratory failure   . Colitis     Past Surgical History  Procedure Laterality Date  . Appendectomy  12/1977  . Cholecystectomy  12/1977  . Breast lumpectomy Right 01/2001  . Tubal ligation  1980's  . Cardiac catheterization  1990's  . Esophagogastroduodenoscopy (egd) with esophageal dilation  X 7  . Esophagogastroduodenoscopy N/A 06/06/2015    Procedure: ESOPHAGOGASTRODUODENOSCOPY (EGD);  Surgeon: Inda Castle, MD;  Location: [facility];  Service: Endoscopy;  Laterality: N/A;       History of present illness and  Hospital Course:     Kindly see H&P for history of present illness and admission details, please review complete Labs, Consult reports and Test reports for all details in brief  HPI  from the history and physical done on the day of admission 07/03/15 by Dr. Elmarie Mainland  Theresa [name] is a 69 y.o. female  With a hx  of significant active ETOH abuse, HLD, HTN, DVT with PE on xarelto, hepatic steatosis who presents to the ED with increased confusion. Pt noted to ingest large amounts of ETOH on a daily basis. Pt has been recently trying to cut back ETOH intake but continues to drink. In the ED, alk phos noted to be elevated at 147 with ast of 287 and alt of 98 and total bili of 18.6. Ammonia noted to be 47. Head CT was unremarkable. Pt was started on CIWA and hospitalist consulted for admission  Hospital Course  69 yo female with hx of ETOH with hepatic steatosis, DVT/PE in 2015 on xarelto. Admitted 7/26 with hepatic encephalopathy, required intubation 7/27 given hepatic encephalopathy and alcohol withdrawals, been treated on steroids for alcohol hepatitis, patient successfully extubated 8/7.  Important events during hospital stay.  7/26 Admit with altered mental status, hepatic encephalopathy. Recent UTI  7/27 Decompensation requiring intubation, PCCM consulted   7/27 steroids started for ETOH hepatitis  7/31 left UE swelling. Large superficial thrombus in arm of PICC 8/1 weaned all day. PICC replaced and left PICC removed. Heparin resumed.  8/2 weaning. Localizing to noxious stim. EEG done 8/4 more awake, following some commands. EEG: consistent with a generalized non-specific cerebral dysfunction(encephalopathy). There was no seizure or seizure predisposition recorded on this study.  8/5 making more improvement.  8/7 extubated over bougie as had no cuff leak from ETT.  8/8 awake, alert, following commands and appropriate. Tolerating diet, physical therapy started.  8/10 patient transfused 1 unit PRBC giving her anemia.  Acute respiratory failure  - This is secondary to hepatic encephalopathy and alcohol withdrawal initially. - Patient intubated on 7/27, extubated on 8/7. - Currently on oxygen, wean as tolerated, continue as needed in the nursing home.  Acute alcoholic hepatitis - continue with prednisone treatment, then prednisone taper. - gradual improvement of LFTs. - to follow with GI as an outpatient , please keep appointment . - Patient counseled about stopping drinking,   Alcoholic liver disease - Continue with rifaximin, metoprolol, and lactulosand Lasix  Hypertension - Continue  with metoprolol  Alcohol abuse - continue with thiamine and folic acid. - consult   History of DVT/PE - Patient is on Xarelto - Patient developed left upper extremity superficial thrombosis at PICC line site, PICC line was discontinued, patient is already on Xarelto  Anemia of chronic disease and critical illness - Monitor , will transfuse 1 unit packed red blood cells today giving her symptomatic anemia and profound weakness.  Weakness - patient with significant proximal and distal weakness in all 4 extremities, bilateral and would free deviation, reflex area and lower, hyperreflexia and upper extremities. - Neurology consult greatly  appreciated, MRI cervical spine without cord involvement. 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 - Patient will need EMG with nerve conduction study for further assessment (done as an outpatient only), has been arranged with [facility], on 8/18 at 2:30 PM .  - Continue with PT/OT  Anemia - Transfused 1 unit PRBC 8/10  Sever protein malnutrition - tolerating oral intake  discharge Condition:  stable   Follow UP  Follow-up Information    Go to [facility], [place].   Specialties:  Neurology, Radiology   Why:  On 8/18, Thursday, be there  at 2:30 PM for EMG with nerve conduction study   Contact  information:   [address] [place] [address] [phone]       Follow up with Hvozdovic, Vita Barley, PA-C On 08/09/2015.   Specialty:  Physician Assistant   Why:  at 9:15am   Contact information:   [place] [address] [phone]       Follow up with  Melinda Crutch, MD.   Specialty:  Family Medicine   Why:  after discharge from SNF.   Contact information:   [place] [facility] [address] [phone]         Discharge Instructions  and  Discharge Medications     Discharge Instructions    Discharge instructions    Complete by:  As directed   Follow with Primary MD  Melinda Crutch, MD after D/C from SNF.  Get CBC, CMP, 2 view Chest X ray checked  by Primary MD next visit.    Activity: As tolerated with Full fall precautions use walker/cane & assistance as needed   Disposition SNF   Diet: Heart Healthy , low sodium, with feeding assistance and aspiration precautions.  For Heart failure patients - Check your Weight same time everyday, if you gain over 2 pounds, or you develop in leg swelling, experience more shortness of  breath or chest pain, call your Primary MD immediately. Follow Cardiac Low Salt Diet and 1.5 lit/day fluid restriction.   On your next visit with your primary care physician please Get Medicines reviewed and adjusted.   Please request your Prim.MD to go over all Hospital Tests and Procedure/Radiological results at the follow up, please get all Hospital records sent to your Prim MD by signing hospital release before you go home.   If you experience worsening of your admission symptoms, develop shortness of breath, life threatening emergency, suicidal or homicidal thoughts you must seek medical attention immediately by calling 911 or calling your MD immediately  if symptoms less severe.  You Must read complete instructions/literature along with all the possible adverse reactions/side effects for all the Medicines you take and that have been prescribed to you. Take any new Medicines after you have completely understood and accpet all the possible adverse reactions/side effects.   Do not drive, operating heavy machinery, perform activities at heights, swimming or participation in water activities or provide baby sitting services if your were admitted for syncope or siezures until you have seen by Primary MD or a Neurologist and advised to do so again.  Do not drive when taking Pain medications.    Do not take more than prescribed Pain, Sleep and Anxiety Medications  Special Instructions: If you have smoked or chewed Tobacco  in the last 2 yrs please stop smoking, stop any regular Alcohol  and or any Recreational drug use.  Wear Seat belts while driving.   Please note  You were cared for by a hospitalist during your hospital stay. If you have any questions about your discharge medications or the care you received while you were in the hospital after you are discharged, you can call the unit and asked to speak with the hospitalist on call if the hospitalist that took care of you is not  available. Once you are discharged, your primary care physician will handle any further medical issues. Please note that NO REFILLS for any discharge medications will be authorized once you are discharged, as it is imperative that you return to your primary care physician (or establish a relationship with a primary care physician  if you do not have one) for your aftercare needs so that they can reassess your need for medications and monitor your lab values.     Increase activity slowly    Complete by:  As directed             Medication List    STOP taking these medications        CENTRATEX 106-1 MG Caps     cephALEXin 500 MG capsule  Commonly known as:  KEFLEX     clobetasol ointment 0.05 %  Commonly known as:  TEMOVATE     CORLANOR 5 MG Tabs tablet  Generic drug:  ivabradine     DULoxetine 30 MG capsule  Commonly known as:  CYMBALTA     loperamide 2 MG capsule  Commonly known as:  IMODIUM     metoCLOPramide 10 MG tablet  Commonly known as:  REGLAN     omeprazole 20 MG capsule  Commonly known as:  PRILOSEC     ondansetron 4 MG disintegrating tablet  Commonly known as:  ZOFRAN-ODT     pantoprazole 40 MG tablet  Commonly known as:  PROTONIX      TAKE these medications        ALPRAZolam 0.25 MG tablet  Commonly known as:  XANAX  Take 1 tablet (0.25 mg total) by mouth at bedtime as needed for anxiety or sleep.     escitalopram 10 MG tablet  Commonly known as:  LEXAPRO  Take 10 mg by mouth daily.     folic acid 1 MG tablet  Commonly known as:  FOLVITE  Take 1 tablet (1 mg total) by mouth daily.     furosemide 40 MG tablet  Commonly known as:  LASIX  Take 0.5 tablets (20 mg total) by mouth daily.     lactulose 10 GM/15ML solution  Commonly known as:  CHRONULAC  Take 30 mLs (20 g total) by mouth 2 (two) times daily.     metoprolol tartrate 25 MG tablet  Commonly known as:  LOPRESSOR  Take 0.5 tablets (12.5 mg total) by mouth 2 (two) times daily.      multivitamin with minerals Tabs tablet  Take 1 tablet by mouth daily.     prednisoLONE 15 MG/5ML Soln  Commonly known as:  PRELONE  Patient should continue Prednisolone 40mg  daily until 08/05/15 (28 days). Following that she will need a rapid taper (over 2 weeks) as follow, To start on 8/29 >> 30mg  x 4 days then                                 20mg  x 4 days then                                 10mg  x 4 days then                                  5mg  x 2 days then stop.     rifaximin 550 MG Tabs tablet  Commonly known as:  XIFAXAN  Take 1 tablet (550 mg total) by mouth 2 (two) times daily.     Rivaroxaban 15 MG Tabs tablet  Commonly known as:  XARELTO  Take 1 tablet (15 mg total) by mouth daily with breakfast.  thiamine 100 MG tablet  Take 1 tablet (100 mg total) by mouth daily.          Diet and Activity recommendation: See Discharge Instructions above   Consults obtained -  [facility] CM   neurology Gastroenterology  Major procedures and Radiology Reports - PLEASE review detailed and final reports for all details, in brief -      Ct Head Wo Contrast  07/08/2015   CLINICAL DATA:  Hepatic encephalopathy.  EXAM: CT HEAD WITHOUT CONTRAST  TECHNIQUE: Contiguous axial images were obtained from the base of the skull through the vertex without intravenous contrast.  COMPARISON:  Radiograph 07/03/2015  FINDINGS: No acute intracranial hemorrhage. No focal mass lesion. No CT evidence of acute infarction. No midline shift or mass effect. No hydrocephalus. Basilar cisterns are patent.  There is mild periventricular white matter hypodensities. Age-appropriate mild cortical atrophy.  Paranasal sinuses and  mastoid air cells are clear.  IMPRESSION: 1. No acute intracranial findings. 2. Mild white matter microvascular disease.   Electronically Signed   By: Suzy Bouchard M.D.   On: 07/08/2015 09:05   Ct Head Wo Contrast  07/03/2015   CLINICAL DATA:  Altered mental status/disorientation  EXAM: CT  HEAD WITHOUT CONTRAST  TECHNIQUE: Contiguous axial images were obtained from the base of the skull through the vertex without intravenous contrast.  COMPARISON:  May 12, 2015  FINDINGS: There is age related volume loss. There is no intracranial mass, hemorrhage, extra-axial fluid collection, or midline shift. There is relatively mild patchy small vessel disease in the centra semiovale bilaterally. There is no new gray-white compartment lesion. No acute infarct evident. Bony calvarium appears intact. Mastoid air cells are clear.  IMPRESSION: Age related volume loss with patchy periventricular small vessel disease. No intracranial mass, hemorrhage, or acute appearing infarct.   Electronically Signed   By: Lowella Grip III M.D.   On: 07/03/2015 13:54   Mr Cervical Spine Wo Contrast  07/18/2015   CLINICAL DATA:  69 year old female with indolent severe generalized weakness. Recent respiratory failure requiring ICU admission, hepatic encephalopathy. Initial encounter.  EXAM: MRI CERVICAL SPINE WITHOUT CONTRAST  TECHNIQUE: Multiplanar, multisequence MR imaging of the cervical spine was performed. No intravenous contrast was administered.  COMPARISON:  Head CTs without contrast 07/08/2015 and earlier.  FINDINGS: Multilevel mild spondylolisthesis in the cervical and upper thoracic spine. Straightening of lordosis. No marrow edema or evidence of acute osseous abnormality.  Cervicomedullary junction is within normal limits. Grossly negative visualized posterior fossa brain parenchyma. No cervical or upper thoracic spinal cord signal abnormality.  Negative paraspinal soft tissues.  C2-C3: Central disc protrusion. Mild facet and ligament flavum hypertrophy. Borderline to mild spinal stenosis with no cord mass effect. No foraminal stenosis.  C3-C4: Mild anterolisthesis with moderate to severe facet hypertrophy on the left. Mild disc/pseudo disc bulge. Mild to moderate ligament flavum hypertrophy. Borderline to mild spinal  stenosis with no cord mass effect. Severe left C4 foraminal stenosis.  C4-C5: Disc space loss mild retrolisthesis. Anterior eccentric circumferential disc osteophyte complex. Mild ligament flavum and facet hypertrophy. Borderline to mild spinal stenosis with no cord mass effect. Severe bilateral C5 foraminal stenosis.  C5-C6: Disc space loss a circumferential disc osteophyte complex. Mild ligament flavum hypertrophy. Borderline to mild spinal stenosis with no spinal cord mass effect. Uncovertebral hypertrophy. Severe left and moderate to severe right C6 foraminal stenosis.  C6-C7: Mild retrolisthesis. Disc space loss. Circumferential disc osteophyte complex. Mild ligament flavum hypertrophy. Uncovertebral hypertrophy. Borderline to mild spinal stenosis  with no cord mass effect. Uncovertebral hypertrophy. Moderate left and moderate to severe right C7 foraminal stenosis.  C7-T1: Mild anterolisthesis. Moderate facet hypertrophy greater on the right. Left uncovertebral hypertrophy. Mild left C8 foraminal stenosis.  No upper thoracic spinal stenosis.  IMPRESSION: 1. Widespread cervical spine degeneration with widespread multifactorial borderline to mild spinal stenosis, but no cervical spinal cord mass effect or signal abnormality. Negative visualized upper thoracic spinal cord. 2. Moderate or severe degenerative foraminal stenosis at the left C4, bilateral C5, bilateral C6, and bilateral C7 nerve levels.   Electronically Signed   By: Genevie Ann M.D.   On: 07/18/2015 11:17   US Abdomen Complete  07/04/2015   CLINICAL DATA:  Elevated liver function tests. Jaundice. Alcohol abuse. Prior cholecystectomy.  EXAM: ULTRASOUND ABDOMEN COMPLETE  COMPARISON:  11/09/2014  FINDINGS: Technically difficult exam due to large patient habitus.  Gallbladder: Surgically absent.  Common bile duct: Diameter: 8 mm, within normal limits status post cholecystectomy.  Liver: Diffusely increased echogenicity of the hepatic parenchyma, consistent  with diffuse hepatic steatosis. No focal mass lesion identified. No evidence of ascites.  IVC: Not visualized secondary to hepatic steatosis.  Pancreas: Not well visualized due to large habitus and overlying bowel gas.  Spleen: Size and appearance within normal limits.  Right Kidney: Length: 10.4 cm. Echogenicity within normal limits. No mass or hydronephrosis visualized.  Left Kidney: Length: 9.5 cm. Echogenicity within normal limits. No mass or hydronephrosis visualized.  Abdominal aorta: No aneurysm visualized.  Other findings: None.  IMPRESSION: Prior cholecystectomy. No evidence of biliary ductal dilatation or other acute findings.  Severe hepatic steatosis.  No evidence of hydronephrosis.   Electronically Signed   By: Earle Gell M.D.   On: 07/04/2015 21:56   Dg Chest Port 1 View  07/14/2015   CLINICAL DATA:  Acute respiratory failure. History of right-sided breast cancer. Former smoker.  EXAM: PORTABLE CHEST - 1 VIEW  COMPARISON:  07/12/2015 and 07/09/2005)  FINDINGS: 0524 hours. Endotracheal tube, nasogastric tube and right arm PICC appear unchanged. The heart size and mediastinal contours are stable. There is improved aeration of both lung bases with minimal residual left lower lobe atelectasis. There is no pleural effusion or pneumothorax. Postsurgical changes are noted within the right breast.  IMPRESSION: Improving bibasilar airspace opacities.  Stable support system.   Electronically Signed   By: Richardean Sale M.D.   On: 07/14/2015 09:10   Dg Chest Port 1 View  07/12/2015   CLINICAL DATA:  Respiratory failure.  EXAM: PORTABLE CHEST - 1 VIEW  COMPARISON:  07/10/2015 .  FINDINGS: Endotracheal tube, NG tube, right PICC line in stable position. Mediastinum and hilar structures are stable. Low lung volumes with mild bibasilar atelectasis and/or infiltrates. No pleural effusion or pneumothorax. Heart size normal .  IMPRESSION: 1. Lines and tubes in stable position. 2. Low lung volumes with persistent  subsegmental atelectasis. Persistent mild left base infiltrate.   Electronically Signed   By: Marcello Moores  Register   On: 07/12/2015 07:05   Dg Chest Port 1 View  07/10/2015   CLINICAL DATA:  Acute respiratory failure.  EXAM: PORTABLE CHEST - 1 VIEW  COMPARISON:  07/08/2015 .  FINDINGS: Interim removal left PICC line. Right PICC line present with tip at cavoatrial junction. Enteric tube stable position. Heart size stable. Chronic interstitial prominence. Low lung volumes with bibasilar atelectasis. Mild infiltrate left lung base cannot be excluded. Small left pleural effusion. No pneumothorax.  IMPRESSION: 1. Interim removal of left PICC line. Right  PICC line has been placed. Its tip is at the cavoatrial junction. Endotracheal tube and NG tube in good anatomic position. 2. Low lung volumes with bibasilar atelectasis. Mild left base infiltrate cannot be excluded. Small left pleural effusion. 3. Chronic interstitial lung disease.   Electronically Signed   By: Marcello Moores  Register   On: 07/10/2015 07:03   Dg Chest Port 1 View  07/08/2015   CLINICAL DATA:  Acute respiratory failure  EXAM: PORTABLE CHEST - 1 VIEW  COMPARISON:  Radiograph 07/06/2015  FINDINGS: Patient rotated rightward. Endotracheal tube and NG tube are unchanged. LEFT PICC line noted. Bilateral small effusions.  IMPRESSION: No significant change.  Stable support apparatus.  Small effusions.   Electronically Signed   By: Suzy Bouchard M.D.   On: 07/08/2015 07:28   Dg Chest Port 1 View  07/06/2015   CLINICAL DATA:  Respiratory failure.  EXAM: PORTABLE CHEST - 1 VIEW  COMPARISON:  July 05, 2015.  FINDINGS: Stable cardiomediastinal silhouette. Endotracheal tube is in grossly good position with distal tip approximately 3 cm above the carina. Nasogastric tube is seen entering the stomach. Left-sided PICC line is noted with distal tip overlying the expected position of the SVC. No pneumothorax is noted. Mild right basilar subsegmental atelectasis is noted.  Stable left basilar opacity is noted concerning for pneumonia or atelectasis with associated pleural effusion. Bony thorax is intact.  IMPRESSION: Endotracheal and nasogastric tubes are in grossly good position. Mild right basilar subsegmental atelectasis noted. Stable larger left basilar opacity is noted concerning for pneumonia or atelectasis with associated effusion.   Electronically Signed   By: Marijo Conception, M.D.   On: 07/06/2015 07:24   Dg Chest Port 1 View  07/05/2015   CLINICAL DATA:  Check endotracheal tube position, respiratory failure  EXAM: PORTABLE CHEST - 1 VIEW  COMPARISON:  07/04/2015  FINDINGS: The endotracheal tube is again noted 2.4 cm above the carina. A nasogastric catheter is now seen within the stomach. The cardiac shadow is stable. Bibasilar atelectatic changes slightly worse on the left than the right are noted. No acute bony abnormality is seen.  IMPRESSION: Bibasilar atelectasis slightly worse on the left.  Tubes and lines as described.   Electronically Signed   By: Inez Catalina M.D.   On: 07/05/2015 07:34   Dg Chest Port 1 View  07/04/2015   CLINICAL DATA:  Hypoxia  EXAM: PORTABLE CHEST - 1 VIEW  COMPARISON:  May 12, 2015  FINDINGS: Endotracheal tube tip is 4.5 cm above the carina. There is atelectatic change in the lung bases. Lungs elsewhere clear. Heart size and pulmonary vascularity are normal. No adenopathy. There is atherosclerotic change in the aorta.  IMPRESSION: Bibasilar atelectasis. Endotracheal tube as described without pneumothorax.   Electronically Signed   By: Lowella Grip III M.D.   On: 07/04/2015 16:19    Micro Results     No results found for this or any previous visit (from the past 240 hour(s)).     Today   Subjective:   Tammey Deeg today has no headache,no chest abdominal pain, reports good appetite, and multiple bowel movements yesterday.  Objective:   Blood pressure 116/69, pulse 66, temperature 98.3 F (36.8 C), temperature  source Oral, resp. rate 18, height $RemoveBe'5\' 2"'[id number]$  (1.575 m), weight 75.2 kg (165 lb 12.6 oz), SpO2 100 %.   Intake/Output Summary (Last 24 hours) at 07/19/15 0833 Last data filed at 07/19/15 0435  Gross per 24 hour  Intake  972 ml  Output      5 ml  Net    967 ml    Exam Awake Alert, Oriented, jaundiced Supple Neck,No JVD, No cervical lymphadenopathy appriciated.  Symmetrical Chest wall movement, Good air movement bilaterally,  RRR,No Gallops,Rubs or new Murmurs, No Parasternal Heave +ve B.Sounds, Abd Soft, No tenderness,  No Cyanosis, Clubbing , feet inverted in ward, mild pitting edema edema, significant upper and lower extremity weakness  Data Review   CBC w Diff: Lab Results  Component Value Date   WBC 11.9* 07/18/2015   WBC 12.0* 02/26/2015   HGB 7.7* 07/18/2015   HGB 14.4 02/26/2015   HCT 25.2* 07/18/2015   HCT 44.4 02/26/2015   PLT 228 07/18/2015   PLT 326 02/26/2015   LYMPHOPCT 14 07/09/2015   LYMPHOPCT 20.2 02/26/2015   MONOPCT 13* 07/09/2015   MONOPCT 7.7 02/26/2015   EOSPCT 0 07/09/2015   EOSPCT 1.0 02/26/2015   BASOPCT 0 07/09/2015   BASOPCT 0.5 02/26/2015    CMP: Lab Results  Component Value Date   NA 143 07/18/2015   NA 144 02/26/2015   K 3.5 07/18/2015   K 3.0* 02/26/2015   CL 112* 07/18/2015   CL 104 03/02/2013   CO2 23 07/18/2015   CO2 23 02/26/2015   BUN 17 07/18/2015   BUN 6.5* 02/26/2015   CREATININE 0.52 07/18/2015   CREATININE 0.8 02/26/2015   PROT 5.3* 07/18/2015   PROT 7.0 02/26/2015   ALBUMIN 1.9* 07/18/2015   ALBUMIN 3.1* 02/26/2015   BILITOT 6.1* 07/18/2015   BILITOT 0.76 02/26/2015   ALKPHOS 113 07/18/2015   ALKPHOS 100 02/26/2015   AST 209* 07/18/2015   AST 81* 02/26/2015   ALT 245* 07/18/2015   ALT 64* 02/26/2015  .   Total Time in preparing paper work, data evaluation and todays exam - 35 minutes  ELGERGAWY, DAWOOD M.D on 07/19/2015 at 8:33 AM  [facility]   Office  [phone]

## 2015-07-19 NOTE — Care Management Important Message (Signed)
Important Message  Patient Details  Name: Theresa [name] MRN: [id number] Date of Birth: May 20, 1946   Medicare Important Message Given:  Yes-third notification given    Camillo Flaming 07/19/2015, 1:09 [place] Message  Patient Details  Name: Theresa [name] MRN: [id number] Date of Birth: [birth year]   Medicare Important Message Given:  Yes-third notification given    Camillo Flaming 07/19/2015, 1:09 PM

## 2015-07-19 NOTE — Discharge Instructions (Signed)
Follow with Primary MD  Melinda Crutch, MD after D/C from SNF.  Get CBC, CMP, 2 view Chest X ray checked  by Primary MD next visit.    Activity: As tolerated with Full fall precautions use walker/cane & assistance as needed   Disposition SNF   Diet: Heart Healthy , low sodium, with feeding assistance and aspiration precautions.  For Heart failure patients - Check your Weight same time everyday, if you gain over 2 pounds, or you develop in leg swelling, experience more shortness of breath or chest pain, call your Primary MD immediately. Follow Cardiac Low Salt Diet and 1.5 lit/day fluid restriction.   On your next visit with your primary care physician please Get Medicines reviewed and adjusted.   Please request your Prim.MD to go over all Hospital Tests and Procedure/Radiological results at the follow up, please get all Hospital records sent to your Prim MD by signing hospital release before you go home.   If you experience worsening of your admission symptoms, develop shortness of breath, life threatening emergency, suicidal or homicidal thoughts you must seek medical attention immediately by calling 911 or calling your MD immediately  if symptoms less severe.  You Must read complete instructions/literature along with all the possible adverse reactions/side effects for all the Medicines you take and that have been prescribed to you. Take any new Medicines after you have completely understood and accpet all the possible adverse reactions/side effects.   Do not drive, operating heavy machinery, perform activities at heights, swimming or participation in water activities or provide baby sitting services if your were admitted for syncope or siezures until you have seen by Primary MD or a Neurologist and advised to do so again.  Do not drive when taking Pain medications.    Do not take more than prescribed Pain, Sleep and Anxiety Medications  Special Instructions: If you have smoked or  chewed Tobacco  in the last 2 yrs please stop smoking, stop any regular Alcohol  and or any Recreational drug use.  Wear Seat belts while driving.   Please note  You were cared for by a hospitalist during your hospital stay. If you have any questions about your discharge medications or the care you received while you were in the hospital after you are discharged, you can call the unit and asked to speak with the hospitalist on call if the hospitalist that took care of you is not available. Once you are discharged, your primary care physician will handle any further medical issues. Please note that NO REFILLS for any discharge medications will be authorized once you are discharged, as it is imperative that you return to your primary care physician (or establish a relationship with a primary care physician if you do not have one) for your aftercare needs so that they can reassess your need for medications and monitor your lab values.

## 2015-07-19 NOTE — Progress Notes (Signed)
Occupational Therapy Treatment Patient Details Name: Theresa [name] MRN: [id number] DOB: Aug 14, 1946 Today's Date: 07/19/2015    History of present illness 69 yo female admitted 7/26 with hepatic encephalopathy, VDRF, and ETOH withdrawal. Intubated 7/27 and extubated 07/15/15. Hx of ETOH abuse, PE, DVT, SVT, colitis.    OT comments  Pt sat eob x 15 minutes with min guard to min A with unilateral UE support and bil LEs supported.  Worked on grooming from sitting, unsupported for hair and supported by bed for teeth  Follow Up Recommendations  SNF    Equipment Recommendations   (to be further assessed)    Recommendations for Other Services      Precautions / Restrictions Precautions Precautions: Fall Restrictions Weight Bearing Restrictions: No       Mobility Bed Mobility     Rolling: Max assist   Supine to sit: Total assist (pt 10%)     General bed mobility comments: Assist for trunk and bil LEs. Utilized bedpad.  Transfers                      Balance   Sitting-balance support: Single extremity supported;Feet supported Sitting balance-Leahy Scale:  (poor to fair; min guard for safety:  pt lifted RUE/decr bal) Sitting balance - Comments: sat eob for 15 minutes with min guard to min A once assisted to midline.  Pt initially leaning to R                           ADL       Grooming: Brushing hair;Minimal assistance;Sitting (unsupported; teeth from bed level with min A)                                 General ADL Comments: Pt needed min A for hair for both balance and to untangle knots in hair.    She needed min guard for balance with unilateral support and feet supported on floor; returned to supine after 15 minutes for teeth as bil UEs were needed. Cues to spit water out as she held it in her mouth for a long time.   Pt needed occasional min A to sit unsupported, mostly min guard.  Pt used LUE for support in addition to bil LEs.   RLE supinated.        Vision                     Perception     Praxis      Cognition   Behavior During Therapy: Flat affect Overall Cognitive Status: Impaired/Different from baseline Area of Impairment: Memory;Following commands;Safety/judgement;Awareness;Problem solving     Memory: Decreased short-term memory  Following Commands: Follows one step commands with increased time     Problem Solving: Slow processing;Requires verbal cues General Comments: had difficulty answering questions    Extremity/Trunk Assessment               Exercises     Shoulder Instructions       General Comments      Pertinent Vitals/ Pain       Pain Assessment: No/denies pain  Home Living  Prior Functioning/Environment              Frequency Min 2X/week     Progress Toward Goals  OT Goals(current goals can now be found in the care plan section)  Progress towards OT goals: Progressing toward goals     Plan      Co-evaluation                 End of Session     Activity Tolerance Patient tolerated treatment well   Patient Left in bed;with call bell/phone within reach   Nurse Communication          Time: 3428-7681 OT Time Calculation (min): 34 min  Charges: OT General Charges $OT Visit: 1 Procedure OT Treatments $Self Care/Home Management : 8-22 mins $Therapeutic Activity: 8-22 mins  Milicent Acheampong 07/19/2015, 11:00 AM  Lesle Chris, OTR/L [phone] 07/19/2015

## 2015-07-19 NOTE — Progress Notes (Signed)
Report called to Myriam Jacobson at [facility].

## 2015-07-19 NOTE — Progress Notes (Signed)
ANTICOAGULATION CONSULT NOTE - Follow up  Pharmacy Consult for Lovenox >> Xarelto Indication: VTE Treatment/Hx PE (Xarelto PTA)  Allergies  Allergen Reactions  . Ciprofloxacin Hives  . Sulfa Antibiotics Hives    Patient Measurements: Height: 5\' 2"  (157.5 cm) Weight: 165 lb 12.6 oz (75.2 kg) IBW/kg (Calculated) : 50.1  Vital Signs: Temp: 98.3 F (36.8 C) (08/11 0435) Temp Source: Oral (08/11 0435) BP: 116/69 mmHg (08/11 0435) Pulse Rate: 66 (08/11 0435)  Labs:  Recent Labs  07/17/15 0415 07/17/15 1820 07/18/15 0500  HGB 7.7*  --  7.7*  HCT 24.3*  --  25.2*  PLT 203  --  228  LABPROT 19.2*  --   --   INR 1.61*  --   --   CREATININE 0.42*  --  0.52  CKTOTAL  --  109  --     Estimated Creatinine Clearance: 63 mL/min (by C-G formula based on Cr of 0.52).  Assessment: 69 yr female with significant PMH including ETOH abuse, HTN, DVT & PE.Patient was on Xarelto 15mg  daily PTA with reported last dose on 7/25.  Heparin drip was started while patient was NPO then later discontinued by CCM due to liver disease and since patient had completed treatment course for PE after 1 year.  On 7/31, heparin infusion was resumed after LUE [place] found large superficial thrombus in arm of PICC.  Switched to Lovenox 8/5; then back to Xarelto on 8/8.  Due to patient's coagulopathy and provoked UE clot, decision was made to not actively treat new clot, rather to extend current reduced Xarelto dose indefinitely.  Today, 07/19/2015:   H/H low but remains stable, Pltc WNL. Hx chronic anemia, MD following with standing order to transfuse  No bleeding issues reported.  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

## 2015-07-24 ENCOUNTER — Telehealth: Payer: Self-pay | Admitting: Diagnostic Neuroimaging

## 2015-07-24 NOTE — Telephone Encounter (Signed)
Will get more information at EMG visit. -VRP

## 2015-07-24 NOTE — Telephone Encounter (Signed)
Theresa [name]/[facility] called requesting for Dr Leta Baptist to address foot alignment at pt's next appt 08/14/15 and do we do serial casting or bracing? For left foot deformity. Deformity due to bedrest x 15 days.

## 2015-07-26 ENCOUNTER — Encounter: Payer: Self-pay | Admitting: Diagnostic Neuroimaging

## 2015-08-09 ENCOUNTER — Ambulatory Visit: Payer: Medicare Other | Admitting: Physician Assistant

## 2015-08-11 ENCOUNTER — Other Ambulatory Visit: Payer: Self-pay

## 2015-08-14 ENCOUNTER — Ambulatory Visit (INDEPENDENT_AMBULATORY_CARE_PROVIDER_SITE_OTHER): Payer: No Typology Code available for payment source | Admitting: Diagnostic Neuroimaging

## 2015-08-14 ENCOUNTER — Encounter (INDEPENDENT_AMBULATORY_CARE_PROVIDER_SITE_OTHER): Payer: Self-pay | Admitting: Diagnostic Neuroimaging

## 2015-08-14 DIAGNOSIS — Z0289 Encounter for other administrative examinations: Secondary | ICD-10-CM

## 2015-08-14 DIAGNOSIS — G609 Hereditary and idiopathic neuropathy, unspecified: Secondary | ICD-10-CM

## 2015-08-14 NOTE — Procedures (Signed)
   [facility]  NCS (NERVE CONDUCTION STUDY) WITH EMG (ELECTROMYOGRAPHY) REPORT   STUDY DATE: 08/14/15 PATIENT NAME: Theresa [name] DOB: 02-25-1946 MRN: [id number]  ORDERING CLINICIAN: Elgergawy  TECHNOLOGIST: Laretta Alstrom  ELECTROMYOGRAPHER: Earlean Polka. Penumalli, MD  CLINICAL INFORMATION: 69 year old female with upper and lower extremity weakness. History of alcohol abuse and hepatic encephalopathy.  FINDINGS: NERVE CONDUCTION STUDY: Right median and right ulnar motor responses have decreased amplitudes, normal conduction velocities and normal F-wave latencies.  Left peroneal and left tibial motor responses could not be obtained. Right peroneal and right tibial motor responses have prolonged distal latencies, decreased upgaze, normal conduction velocities and absent F wave latencies.  Right median and right ulnar sensory responses are normal.  Bilateral sural and peroneal sensory responses could not be obtained.   NEEDLE ELECTROMYOGRAPHY: Patient is on Xarelto anticoagulation.  Needle examination of right tibialis anterior and left gastrocnemius muscles shows no abnormal spontaneous activity at rest and decreased motor unit recruitment on exertion. Left tibialis anterior is normal.    IMPRESSION:  Abnormal study demonstrate: 1. Widespread axonal, length dependent, sensory-motor polyneuropathy. 2. Superimposed polyradiculopathy not excluded 3. No evidence of focal compressive/entrapment neuropathy. Given clinical context, the patient's neuropathy is most likely related to alcohol abuse and critical illness etiologies.     INTERPRETING PHYSICIAN:  Penni Bombard, MD Certified in Neurology, Neurophysiology and Neuroimaging  [facility] [address] [place], [place] [address] [phone]

## 2015-08-20 ENCOUNTER — Other Ambulatory Visit (HOSPITAL_COMMUNITY): Payer: Self-pay

## 2015-08-20 ENCOUNTER — Inpatient Hospital Stay
Admission: AD | Admit: 2015-08-20 | Discharge: 2015-09-08 | Disposition: E | Payer: Medicare Other | Source: Ambulatory Visit | Attending: Internal Medicine | Admitting: Internal Medicine

## 2015-08-20 DIAGNOSIS — Z4659 Encounter for fitting and adjustment of other gastrointestinal appliance and device: Secondary | ICD-10-CM

## 2015-08-20 DIAGNOSIS — N19 Unspecified kidney failure: Secondary | ICD-10-CM

## 2015-08-20 DIAGNOSIS — J189 Pneumonia, unspecified organism: Secondary | ICD-10-CM

## 2015-08-20 DIAGNOSIS — D8989 Other specified disorders involving the immune mechanism, not elsewhere classified: Secondary | ICD-10-CM

## 2015-08-20 DIAGNOSIS — R0602 Shortness of breath: Secondary | ICD-10-CM

## 2015-08-20 DIAGNOSIS — B948 Sequelae of other specified infectious and parasitic diseases: Secondary | ICD-10-CM

## 2015-08-20 DIAGNOSIS — F1020 Alcohol dependence, uncomplicated: Secondary | ICD-10-CM

## 2015-08-20 DIAGNOSIS — Z452 Encounter for adjustment and management of vascular access device: Secondary | ICD-10-CM

## 2015-08-20 DIAGNOSIS — Z95828 Presence of other vascular implants and grafts: Secondary | ICD-10-CM

## 2015-08-20 LAB — BLOOD GAS, ARTERIAL
ACID-BASE DEFICIT: 1 mmol/L (ref 0.0–2.0)
Bicarbonate: 22.7 mEq/L (ref 20.0–24.0)
DELIVERY SYSTEMS: POSITIVE
Expiratory PAP: 8
FIO2: 0.5
INSPIRATORY PAP: 16
LHR: 16 {breaths}/min
O2 SAT: 98.1 %
PCO2 ART: 34.6 mmHg — AB (ref 35.0–45.0)
PH ART: 7.431 (ref 7.350–7.450)
Patient temperature: 98.2
TCO2: 23.8 mmol/L (ref 0–100)
pO2, Arterial: 107 mmHg — ABNORMAL HIGH (ref 80.0–100.0)

## 2015-08-21 ENCOUNTER — Institutional Professional Consult (permissible substitution) (HOSPITAL_COMMUNITY): Payer: Self-pay

## 2015-08-21 ENCOUNTER — Other Ambulatory Visit (HOSPITAL_COMMUNITY): Payer: Self-pay

## 2015-08-21 LAB — PROTIME-INR
INR: 2.49 — ABNORMAL HIGH (ref 0.00–1.49)
PROTHROMBIN TIME: 26.6 s — AB (ref 11.6–15.2)

## 2015-08-21 LAB — PHOSPHORUS: Phosphorus: 3.7 mg/dL (ref 2.5–4.6)

## 2015-08-21 LAB — CBC WITH DIFFERENTIAL/PLATELET
Basophils Absolute: 0 10*3/uL (ref 0.0–0.1)
Basophils Relative: 0 % (ref 0–1)
EOS ABS: 0 10*3/uL (ref 0.0–0.7)
Eosinophils Relative: 0 % (ref 0–5)
HCT: 30.7 % — ABNORMAL LOW (ref 36.0–46.0)
Hemoglobin: 10.3 g/dL — ABNORMAL LOW (ref 12.0–15.0)
Lymphocytes Relative: 12 % (ref 12–46)
Lymphs Abs: 1.3 10*3/uL (ref 0.7–4.0)
MCH: 31.4 pg (ref 26.0–34.0)
MCHC: 33.6 g/dL (ref 30.0–36.0)
MCV: 93.6 fL (ref 78.0–100.0)
MONO ABS: 0.5 10*3/uL (ref 0.1–1.0)
Monocytes Relative: 5 % (ref 3–12)
NEUTROS PCT: 83 % — AB (ref 43–77)
Neutro Abs: 9.1 10*3/uL — ABNORMAL HIGH (ref 1.7–7.7)
PLATELETS: 250 10*3/uL (ref 150–400)
RBC: 3.28 MIL/uL — AB (ref 3.87–5.11)
RDW: 16.7 % — AB (ref 11.5–15.5)
WBC: 10.9 10*3/uL — AB (ref 4.0–10.5)

## 2015-08-21 LAB — BLOOD GAS, ARTERIAL
Acid-base deficit: 0.2 mmol/L (ref 0.0–2.0)
Bicarbonate: 23.4 mEq/L (ref 20.0–24.0)
FIO2: 100
O2 SAT: 99.5 %
PATIENT TEMPERATURE: 98.6
PO2 ART: 186 mmHg — AB (ref 80.0–100.0)
TCO2: 24.5 mmol/L (ref 0–100)
pCO2 arterial: 34.6 mmHg — ABNORMAL LOW (ref 35.0–45.0)
pH, Arterial: 7.445 (ref 7.350–7.450)

## 2015-08-21 LAB — COMPREHENSIVE METABOLIC PANEL
ALBUMIN: 1.5 g/dL — AB (ref 3.5–5.0)
ALT: 24 U/L (ref 14–54)
AST: 47 U/L — AB (ref 15–41)
Alkaline Phosphatase: 105 U/L (ref 38–126)
Anion gap: 11 (ref 5–15)
BUN: 15 mg/dL (ref 6–20)
CHLORIDE: 105 mmol/L (ref 101–111)
CO2: 24 mmol/L (ref 22–32)
Calcium: 8.2 mg/dL — ABNORMAL LOW (ref 8.9–10.3)
Creatinine, Ser: 1.13 mg/dL — ABNORMAL HIGH (ref 0.44–1.00)
GFR calc Af Amer: 56 mL/min — ABNORMAL LOW (ref >60)
GFR, EST NON AFRICAN AMERICAN: 48 mL/min — AB (ref >60)
Glucose, Bld: 86 mg/dL (ref 65–99)
POTASSIUM: 3.4 mmol/L — AB (ref 3.5–5.1)
Sodium: 140 mmol/L (ref 135–145)
Total Bilirubin: 1.5 mg/dL — ABNORMAL HIGH (ref 0.3–1.2)
Total Protein: 4.5 g/dL — ABNORMAL LOW (ref 6.5–8.1)

## 2015-08-21 LAB — VITAMIN B12: Vitamin B-12: 578 pg/mL (ref 180–914)

## 2015-08-21 LAB — PROCALCITONIN: PROCALCITONIN: 0.19 ng/mL

## 2015-08-21 LAB — MAGNESIUM: MAGNESIUM: 1.3 mg/dL — AB (ref 1.7–2.4)

## 2015-08-21 LAB — AMMONIA: Ammonia: 24 umol/L (ref 9–35)

## 2015-08-22 ENCOUNTER — Other Ambulatory Visit (HOSPITAL_COMMUNITY): Payer: Self-pay

## 2015-08-22 DIAGNOSIS — J9621 Acute and chronic respiratory failure with hypoxia: Secondary | ICD-10-CM

## 2015-08-22 LAB — CBC WITH DIFFERENTIAL/PLATELET
BASOS ABS: 0 10*3/uL (ref 0.0–0.1)
BASOS PCT: 0 %
Eosinophils Absolute: 0.1 10*3/uL (ref 0.0–0.7)
Eosinophils Relative: 1 %
HEMATOCRIT: 29.7 % — AB (ref 36.0–46.0)
HEMOGLOBIN: 9.9 g/dL — AB (ref 12.0–15.0)
LYMPHS PCT: 16 %
Lymphs Abs: 1.5 10*3/uL (ref 0.7–4.0)
MCH: 31.1 pg (ref 26.0–34.0)
MCHC: 33.3 g/dL (ref 30.0–36.0)
MCV: 93.4 fL (ref 78.0–100.0)
Monocytes Absolute: 0.6 10*3/uL (ref 0.1–1.0)
Monocytes Relative: 6 %
NEUTROS ABS: 7.5 10*3/uL (ref 1.7–7.7)
NEUTROS PCT: 77 %
Platelets: 190 10*3/uL (ref 150–400)
RBC: 3.18 MIL/uL — AB (ref 3.87–5.11)
RDW: 16.4 % — ABNORMAL HIGH (ref 11.5–15.5)
WBC: 9.7 10*3/uL (ref 4.0–10.5)

## 2015-08-22 LAB — MAGNESIUM: Magnesium: 2.7 mg/dL — ABNORMAL HIGH (ref 1.7–2.4)

## 2015-08-22 LAB — C DIFFICILE QUICK SCREEN W PCR REFLEX
C DIFFICILE (CDIFF) TOXIN: NEGATIVE
C DIFFICLE (CDIFF) ANTIGEN: NEGATIVE
C Diff interpretation: NEGATIVE

## 2015-08-22 LAB — FOLATE RBC
Folate, Hemolysate: >620 ng/mL
Folate, RBC: >1944 ng/mL (ref >498)
HEMATOCRIT: 31.9 % — AB (ref 34.0–46.6)

## 2015-08-22 LAB — PHOSPHORUS: PHOSPHORUS: 3.3 mg/dL (ref 2.5–4.6)

## 2015-08-22 NOTE — Consult Note (Signed)
Name: Theresa [name] MRN: [id number] DOB: June 28, 1946    ADMISSION DATE:  09/07/2015 CONSULTATION DATE:  08/12/15  REFERRING MD :  Dr. Jean Rosenthal  CHIEF COMPLAINT:  Hypoxic Respiratory Failure   BRIEF PATIENT DESCRIPTION: 69 y/o F with a PMH of HTN, HLD, ETOH abuse, DVT on Xarelto, hepatic steatosis and recent admission to [facility] from 7/26-8/11 for hepatic encephalopathy with respiratory failure requiring intubation.  She was ultimately discharged to a SNF from 8/11-9/7.  She reportedly was progressing with physical therapy until 9/7 when she was noted to have a cognitive decline and decreased blood pressure.  She was admitted from 9/7-9/12 to [facility] for hypoxic respiratory failure in the setting of aspiration PNA.  She was then transferred to [facility] on 9/12 for further rehab, noted increased O2 needs / VM usage on admit to [facility] [place]  7/26 - 8/11  Admit to [facility] for ETOH withdrawal, AMS.  Developed respiratory failure, required intubation  8/11 - 9/07  Admit to [facility] for rehab 9/07 - 9/12  Admit to [facility] for hypoxic resp fx, concern for aspiration PNA, increased O2 needs/ VM 9/12  Admit to [facility] 9/14  PCCM consulted for hypoxic respiratory failure   STUDIES:  6/29  EGD - per Dr. Deatra Ina, notable for esophageal stricture at GE junction, small hiatal hernia   HISTORY OF PRESENT ILLNESS:  69 y/o F with a PMH of HTN, HLD, ETOH abuse, DVT on Xarelto, hepatic steatosis and recent admission to [facility] from 7/26-8/11 for hepatic encephalopathy with respiratory failure requiring intubation.  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  She was  ultimately discharged to a SNF from 8/11-9/7 for rehab.  She reportedly was progressing with physical therapy until 9/7 when she was noted to have a cognitive decline and decreased blood pressure.  The patient was transferred to [facility] for evaluation.  She was found to have bilateral infiltrates and concerns for aspiration PNA. She was admitted from 9/7-9/12 to [facility] for hypoxic respiratory failure in the setting of aspiration PNA.  The patient had ongoing concerns for increased O2 needs requiring a VM mask.  She was then transferred to [facility] on 9/12 for further rehab, noted increased O2 needs / VM usage on admit to [facility].     The patient remained on NRB and PCCM consulted 9/14 for evaluation.     PAST MEDICAL HISTORY :   has a past medical history of Esophageal stricture; HLD (hyperlipidemia); Dysthymic disorder; Other and unspecified coagulation defects; Diaphragmatic hernia without mention of obstruction or gangrene; PAD (peripheral artery disease); Pulmonary embolism (06/2014); DVT (deep venous thrombosis) (06/2014); GERD (gastroesophageal reflux disease); History of hiatal hernia; Anxiety; Malignant neoplasm of breast (female), unspecified site (2002); SVT (supraventricular tachycardia) (11/07/2014); On home oxygen therapy; Atherosclerosis; Alcohol abuse; Chronic respiratory failure; and Colitis.  has past surgical history that includes Appendectomy (12/1977); Cholecystectomy (12/1977); Breast lumpectomy (Right, 01/2001); Tubal ligation (1980's); Cardiac catheterization (1990's); Esophagogastroduodenoscopy (egd) with esophageal dilation (X 7); and Esophagogastroduodenoscopy (N/A, 06/06/2015).   Prior to Admission medications   Medication Sig Start Date End Date Taking? Authorizing Provider  ALPRAZolam (XANAX) 0.25 MG tablet Take 1 tablet (0.25 mg total) by mouth at bedtime as needed for anxiety or sleep.  07/19/15   Silver Huguenin Elgergawy, MD  escitalopram (LEXAPRO) 10 MG tablet Take 10 mg by  mouth daily. 11/24/14   Historical Provider, MD  folic acid (FOLVITE) 1 MG tablet Take 1 tablet (1 mg total) by mouth daily. 07/19/15   Silver Huguenin Elgergawy, MD  furosemide (LASIX) 40 MG tablet Take 0.5 tablets (20 mg total) by mouth daily. 07/19/15   Silver Huguenin Elgergawy, MD  lactulose (CHRONULAC) 10 GM/15ML solution Take 30 mLs (20 g total) by mouth 2 (two) times daily. 07/19/15   Silver Huguenin Elgergawy, MD  metoprolol tartrate (LOPRESSOR) 25 MG tablet Take 0.5 tablets (12.5 mg total) by mouth 2 (two) times daily. 07/19/15   Silver Huguenin Elgergawy, MD  Multiple Vitamin (MULTIVITAMIN WITH MINERALS) TABS tablet Take 1 tablet by mouth daily. 07/19/15   Albertine Patricia, MD  prednisoLONE (PRELONE) 15 MG/5ML SOLN Patient should continue Prednisolone 40mg  daily until 08/05/15 (28 days). Following that she will need a rapid taper (over 2 weeks) as follow, To start on 8/29 >> 30mg  x 4 days then                                 20mg  x 4 days then                                 10mg  x 4 days then                                  5mg  x 2 days then stop. 07/19/15   Silver Huguenin Elgergawy, MD  rifaximin (XIFAXAN) 550 MG TABS tablet Take 1 tablet (550 mg total) by mouth 2 (two) times daily. 07/19/15   Albertine Patricia, MD  Rivaroxaban (XARELTO) 15 MG TABS tablet Take 1 tablet (15 mg total) by mouth daily with breakfast. 07/19/15   Albertine Patricia, MD  thiamine 100 MG tablet Take 1 tablet (100 mg total) by mouth daily. 07/19/15   Albertine Patricia, MD   Allergies  Allergen Reactions  . Ciprofloxacin Hives  . Sulfa Antibiotics Hives    FAMILY HISTORY:  family history includes Deep vein thrombosis in her daughter; Heart attack in her father; Heart disease in her father; Lung cancer in her maternal grandfather. There is no history of Colon cancer.   SOCIAL HISTORY:  reports that she quit smoking about 16 years ago. Her smoking use included Cigarettes. She has a 57 pack-year smoking history. She has never used smokeless  tobacco. She reports that she drinks about 27.6 oz of alcohol per week. She reports that she does not use illicit drugs.  REVIEW OF SYSTEMS:   Unable to complete with patient as she is altered / hepatic encephalopathy.  SUBJECTIVE:   VITAL SIGNS:  97.0, 90/60, 80, 96%, 24  PHYSICAL EXAMINATION: General:  Chronically ill appearing female on NRB Neuro:  Awake, follows commands, oriented to name,a ge & place HEENT:  Dry mucosa, NGT Cardiovascular:  s1s2 tachy  Lungs:  Faint crackles rt base, otherwise clear Abdomen:  Soft, nont ender Musculoskeletal:  No edema, no deformity Skin:  No rash, breakdown, spiders   Recent Labs Lab 08/21/15 0610  NA 140  K 3.4*  CL 105  CO2 24  BUN 15  CREATININE 1.13*  GLUCOSE 86    Recent  Labs Lab 08/21/15 0610 08/22/15 0600  HGB 10.3* 9.9*  HCT 30.7* 29.7*  WBC 10.9* 9.7  PLT 250 190   Dg Chest Port 1 View  08/21/2015   CLINICAL DATA:  Post PICC line placement.  EXAM: PORTABLE CHEST - 1 VIEW  COMPARISON:  08/21/2015 at 16:25 p.m.  FINDINGS: Nasogastric tube unchanged. Right-sided PICC line has been pulled back slightly with tip overlying the SVC at the level of the carina.  Lungs are hypoinflated with persistent bilateral coarse interstitial changes. Cardiomediastinal silhouette and remainder of the exam is unchanged.  IMPRESSION: Hypoinflation with stable bilateral coarse interstitial changes.  Right-sided PICC line with tip over the SVC.   Electronically Signed   By: Marin Olp M.D.   On: 08/21/2015 17:57   Dg Chest Port 1 View  08/21/2015   CLINICAL DATA:  PICC line placement  EXAM: PORTABLE CHEST - 1 VIEW  COMPARISON:  08/09/15  FINDINGS: PICC tip is 4.5cm into right atrium and should be retracted about 5cm with repeat imaging. NG tube crosses into the stomach. Heavy bilateral interstitial change similar to prior radiograph. No pneumothorax.  IMPRESSION: PICC line as described. These results will be called to the ordering clinician or  representative by the Radiologist Assistant, and communication documented in the PACS or zVision Dashboard.   Electronically Signed   By: Skipper Cliche M.D.   On: 08/21/2015 16:37   Dg Chest Port 1 View  08/09/2015   CLINICAL DATA:  Pneumonia.  EXAM: PORTABLE CHEST - 1 VIEW  COMPARISON:  Chest radiograph obtained yesterday at 1116 hour  FINDINGS: Improved lung aeration from prior with persistent but improved diffuse interstitial prominence. The heart size is normal. There is atherosclerosis of the thoracic aorta. Persistent blunting of both costophrenic angles. No pneumothorax.  IMPRESSION: Improving lung aeration with persistent but improved interstitial prominence, may reflect improving edema or pneumonia. Overall improved radiographic appearance of the chest.   Electronically Signed   By: Jeb Levering M.D.   On: 08/26/2015 23:19   Dg Abd Portable 1v  08/21/2015   CLINICAL DATA:  NG tube placement.  EXAM: PORTABLE ABDOMEN - 1 VIEW  COMPARISON:  CT of the abdomen dated 05/12/2015  FINDINGS: There is a pathology gaseous distention of multiple small bowel loops. Residual high density material is seen within the stomach, and left colon. Enteric catheter overlies the expected location of gastric body with tip at the level of gastric cardia. No evidence of free intra-abdominal gas. Postsurgical changes in the right upper quadrant are seen. Dextro convex scoliosis of the thoraco lumbar spine and associated osteoarthritic changes are noted.  IMPRESSION: Enteric catheter likely within the proximal stomach.  Nonspecific bowel gas pattern with diffuse mild gaseous distention of small bowel loops.   Electronically Signed   By: Fidela Salisbury M.D.   On: 08/21/2015 13:49    ASSESSMENT / PLAN:  Acute on Chronic Hypoxic Respiratory Failure - in setting of suspected aspiration with known esophageal strictures  PE/DVT (LUE dx at [facility] 7/26 admission, unclear timing of PE??) Baseline O2 Dependent - 2L prior to  initial hospitalization   Plan: Strict aspiration precautions NPO  TF via NGT  Intermittent CXR  ABX per primary SVC Will need evaluation per GI for possible dilation of esophageal strictures once stabilized Wean O2 for saturations > 90% Pulmonary hygiene as able: mobilize, incentive spirometry, coughing etc PRN BiPAP for increased WOB if good mental status    Noe Gens, NP-C [facility]  Pgr: [phone] or if no answer [phone] 08/22/2015, 10:00 AM   ATTENDING NOTE: I have personally reviewed patient's available data, including medical history, events of note, physical examination and test results as part of my evaluation.  She had a prolonged admission in 07/2015 requiring mechanical ventilation CT chest from 11/2014 shows very mild interstitial infiltrates. She was transferred to rehabilitation, then back to [facility] on 9/7, then to [facility]-she is now requiring nonrebreather, work of breathing appears normal. Exam as above Best working diagnosis here is aspiration pneumonia, I do note that she had esophageal stricture on a prior EGD and balloon dilation was planned. She does have a history of pulmonary embolism and is on 2 L of oxygen at home. Continue empiric antibiotics, high flow oxygen Once hypoxia is improved-will need to contact [facility] per NP/medical resident whose note is outlined above and that I agree with and edited in full.   Rigoberto Noel MD

## 2015-08-23 ENCOUNTER — Other Ambulatory Visit (HOSPITAL_COMMUNITY): Payer: Self-pay

## 2015-08-23 DIAGNOSIS — J9621 Acute and chronic respiratory failure with hypoxia: Secondary | ICD-10-CM | POA: Diagnosis not present

## 2015-08-23 LAB — CBC WITH DIFFERENTIAL/PLATELET
BASOS ABS: 0 10*3/uL (ref 0.0–0.1)
BASOS PCT: 0 %
EOS ABS: 0.1 10*3/uL (ref 0.0–0.7)
EOS PCT: 1 %
HCT: 32.9 % — ABNORMAL LOW (ref 36.0–46.0)
Hemoglobin: 10.9 g/dL — ABNORMAL LOW (ref 12.0–15.0)
LYMPHS PCT: 15 %
Lymphs Abs: 1.3 10*3/uL (ref 0.7–4.0)
MCH: 31 pg (ref 26.0–34.0)
MCHC: 33.1 g/dL (ref 30.0–36.0)
MCV: 93.5 fL (ref 78.0–100.0)
Monocytes Absolute: 0.4 10*3/uL (ref 0.1–1.0)
Monocytes Relative: 5 %
Neutro Abs: 7 10*3/uL (ref 1.7–7.7)
Neutrophils Relative %: 79 %
PLATELETS: 169 10*3/uL (ref 150–400)
RBC: 3.52 MIL/uL — AB (ref 3.87–5.11)
RDW: 16.4 % — ABNORMAL HIGH (ref 11.5–15.5)
WBC: 8.9 10*3/uL (ref 4.0–10.5)

## 2015-08-23 LAB — BASIC METABOLIC PANEL
ANION GAP: 5 (ref 5–15)
BUN: 19 mg/dL (ref 6–20)
CO2: 32 mmol/L (ref 22–32)
Calcium: 8.9 mg/dL (ref 8.9–10.3)
Chloride: 108 mmol/L (ref 101–111)
Creatinine, Ser: 1.02 mg/dL — ABNORMAL HIGH (ref 0.44–1.00)
GFR, EST NON AFRICAN AMERICAN: 55 mL/min — AB (ref >60)
Glucose, Bld: 127 mg/dL — ABNORMAL HIGH (ref 65–99)
POTASSIUM: 3.4 mmol/L — AB (ref 3.5–5.1)
SODIUM: 145 mmol/L (ref 135–145)

## 2015-08-23 LAB — PROCALCITONIN: PROCALCITONIN: 0.14 ng/mL

## 2015-08-23 NOTE — Progress Notes (Signed)
Name: Theresa [name] MRN: [id number] DOB: 1945/12/19    ADMISSION DATE:  08/09/2015 CONSULTATION DATE:  08/12/15  REFERRING MD :  Dr. Jean Rosenthal  CHIEF COMPLAINT:  Hypoxic Respiratory Failure   BRIEF PATIENT DESCRIPTION: 69 y/o F with a PMH of HTN, HLD, ETOH abuse, DVT on Xarelto, hepatic steatosis and recent admission to [facility] from 7/26-8/11 for hepatic encephalopathy with respiratory failure requiring intubation.  She was ultimately discharged to a SNF from 8/11-9/7.  She reportedly was progressing with physical therapy until 9/7 when she was noted to have a cognitive decline and decreased blood pressure.  She was admitted from 9/7-9/12 to [facility] for hypoxic respiratory failure in the setting of aspiration PNA.  She was then transferred to [facility] on 9/12 for further rehab, noted increased O2 needs / VM usage on admit to [place]  7/26 - 8/11  Admit to [facility] for ETOH withdrawal, AMS.  Developed respiratory failure, required intubation  8/11 - 9/07  Admit to [facility] for rehab 9/07 - 9/12  Admit to [facility] for hypoxic resp fx, concern for aspiration PNA, increased O2 needs/ VM 9/12  Admit to [facility] 9/14  PCCM consulted for hypoxic respiratory failure   STUDIES:  6/29  EGD - per Dr. Deatra Ina, notable for esophageal stricture at GE junction, small hiatal hernia   HISTORY OF PRESENT ILLNESS:  69 y/o F with a PMH of HTN, HLD, ETOH abuse, DVT on Xarelto, hepatic steatosis and recent admission to [facility] from 7/26-8/11 for hepatic encephalopathy with respiratory failure requiring intubation.  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  She was  ultimately discharged to a SNF from 8/11-9/7 for rehab.  She reportedly was progressing with physical therapy until 9/7 when she was noted to have a cognitive decline and decreased blood pressure.  The patient was transferred to [facility] for evaluation.  She was found to have bilateral infiltrates and concerns for aspiration PNA. She was admitted from 9/7-9/12 to [facility] for hypoxic respiratory failure in the setting of aspiration PNA.  The patient had ongoing concerns for increased O2 needs requiring a VM mask.  She was then transferred to [facility] on 9/12 for further rehab, noted increased O2 needs / VM usage on admit to [facility].     The patient remained on NRB and PCCM consulted 9/14 for evaluation.    SUBJECTIVE:  Remains on NRB Denies pain  VITAL SIGNS:  97.0, 90/60, 80, 96%, 24  PHYSICAL EXAMINATION: General:  Chronically ill appearing female on NRB Neuro:  Awake, follows commands, oriented to name,[address] & place HEENT:  Dry mucosa, NGT Cardiovascular:  s1s2 tachy  Lungs:  Faint crackles rt base, otherwise clear Abdomen:  Soft, nont ender Musculoskeletal:  No edema, no deformity Skin:  No rash, breakdown, spiders   Recent Labs Lab 08/21/15 0610 08/23/15 0545  NA 140 145  K 3.4* 3.4*  CL 105 108  CO2 24 32  BUN 15 19  CREATININE 1.13* 1.02*  GLUCOSE 86 127*    Recent Labs Lab 08/21/15 0610 08/22/15 0600 08/23/15 0545  HGB 10.3* 9.9* 10.9*  HCT 30.7*  31.9* 29.7* 32.9*  WBC 10.9* 9.7 8.9  PLT 250 190 169   Dg Chest  Port 1 View  08/21/2015   CLINICAL DATA:  Post PICC line placement.  EXAM: PORTABLE CHEST - 1 VIEW  COMPARISON:  08/21/2015 at 16:25 p.m.  FINDINGS: Nasogastric tube unchanged. Right-sided PICC line has been pulled back slightly with tip overlying the SVC at the level of the carina.  Lungs are hypoinflated with persistent bilateral coarse interstitial changes. Cardiomediastinal silhouette and remainder of the exam is unchanged.  IMPRESSION:  Hypoinflation with stable bilateral coarse interstitial changes.  Right-sided PICC line with tip over the SVC.   Electronically Signed   By: Marin Olp M.D.   On: 08/21/2015 17:57   Dg Abd Portable 1v  08/23/2015   CLINICAL DATA:  NG tube placement  EXAM: PORTABLE ABDOMEN - 1 VIEW  COMPARISON:  08/23/2015  FINDINGS: Nasogastric tube tip is in the projection of the expected location of the stomach. Bowel gas pattern appears nonobstructed. Surgical clips noted within the right upper quadrant of the abdomen. There is a scoliosis deformity noted. Spondylosis identified within the lumbar spine.  IMPRESSION: 1. NG tube tip is in the projection of the expected location of the stomach.   Electronically Signed   By: Kerby Moors M.D.   On: 08/23/2015 12:58   US Abdomen Limited Ruq  08/23/2015   CLINICAL DATA:  69 year old female with history of alcohol abuse.  EXAM: US ABDOMEN LIMITED - RIGHT UPPER QUADRANT  COMPARISON:  Ultrasound dated 07/04/2015 and CT dated 05/12/2015  FINDINGS: Evaluation is limited as the patient was not able to cooperate with the study.  Gallbladder:  Cholecystectomy.  Common bile duct:  Diameter: 5 mm  Liver:  There diffuse increased liver echogenicity compatible with fatty infiltration of the liver. Detection of possible liver lesion is limited due to increased echogenicity.  IMPRESSION: Cholecystectomy.  Fatty liver.   Electronically Signed   By: Anner Crete M.D.   On: 08/23/2015 03:27    ASSESSMENT / PLAN:  Acute on Chronic Hypoxic Respiratory Failure - in setting of suspected aspiration with known esophageal strictures  PE/DVT (LUE dx at [facility] 7/26 admission, unclear timing of PE??) Baseline O2 Dependent - 2L prior to initial hospitalization   -Best working diagnosis here is aspiration pneumonia, I do note that she had esophageal stricture on a prior EGD and balloon dilation was planned. She does have a history of pulmonary embolism and is on 2 L of oxygen at  home.  Plan: Strict aspiration precautions NPO  TF via NGT  Intermittent CXR  ABX per primary SVC Wean O2 for saturations > 90% Pulmonary hygiene as able: mobilize, incentive spirometry, coughing etc PRN BiPAP for increased WOB if good mental status  Once hypoxia is improved-will need to contact GI  Discussed prognosis with husband in detail - he will consider DNI status    08/23/2015, 4:38 PM  Rigoberto Noel MD

## 2015-08-24 ENCOUNTER — Other Ambulatory Visit (HOSPITAL_COMMUNITY): Payer: Medicare Other

## 2015-08-24 ENCOUNTER — Other Ambulatory Visit (HOSPITAL_COMMUNITY): Payer: Self-pay

## 2015-08-24 DIAGNOSIS — I509 Heart failure, unspecified: Secondary | ICD-10-CM | POA: Diagnosis not present

## 2015-08-24 LAB — RENAL FUNCTION PANEL
ALBUMIN: 1.3 g/dL — AB (ref 3.5–5.0)
Anion gap: 5 (ref 5–15)
BUN: 24 mg/dL — AB (ref 6–20)
CALCIUM: 9.8 mg/dL (ref 8.9–10.3)
CO2: 33 mmol/L — AB (ref 22–32)
Chloride: 113 mmol/L — ABNORMAL HIGH (ref 101–111)
Creatinine, Ser: 1 mg/dL (ref 0.44–1.00)
GFR calc Af Amer: >60 mL/min (ref >60)
GFR calc non Af Amer: 56 mL/min — ABNORMAL LOW (ref >60)
GLUCOSE: 112 mg/dL — AB (ref 65–99)
PHOSPHORUS: 2.7 mg/dL (ref 2.5–4.6)
Potassium: 3.4 mmol/L — ABNORMAL LOW (ref 3.5–5.1)
SODIUM: 151 mmol/L — AB (ref 135–145)

## 2015-08-24 NOTE — Progress Notes (Signed)
*  Echocardiogram 2D Echocardiogram has been performed.  Theresa [name] M 08/24/2015, 8:45 AM

## 2015-08-25 LAB — CBC WITH DIFFERENTIAL/PLATELET
BASOS ABS: 0 10*3/uL (ref 0.0–0.1)
Basophils Relative: 0 %
Eosinophils Absolute: 0.2 10*3/uL (ref 0.0–0.7)
Eosinophils Relative: 1 %
HEMATOCRIT: 32.7 % — AB (ref 36.0–46.0)
Hemoglobin: 10.1 g/dL — ABNORMAL LOW (ref 12.0–15.0)
LYMPHS PCT: 17 %
Lymphs Abs: 2.2 10*3/uL (ref 0.7–4.0)
MCH: 30 pg (ref 26.0–34.0)
MCHC: 30.9 g/dL (ref 30.0–36.0)
MCV: 97 fL (ref 78.0–100.0)
Monocytes Absolute: 0.7 10*3/uL (ref 0.1–1.0)
Monocytes Relative: 6 %
NEUTROS ABS: 9.6 10*3/uL — AB (ref 1.7–7.7)
Neutrophils Relative %: 76 %
PLATELETS: 123 10*3/uL — AB (ref 150–400)
RBC: 3.37 MIL/uL — AB (ref 3.87–5.11)
RDW: 16.6 % — ABNORMAL HIGH (ref 11.5–15.5)
WBC: 12.7 10*3/uL — AB (ref 4.0–10.5)

## 2015-08-25 LAB — BASIC METABOLIC PANEL
ANION GAP: 4 — AB (ref 5–15)
BUN: 29 mg/dL — ABNORMAL HIGH (ref 6–20)
CO2: 32 mmol/L (ref 22–32)
Calcium: 10.3 mg/dL (ref 8.9–10.3)
Chloride: 117 mmol/L — ABNORMAL HIGH (ref 101–111)
Creatinine, Ser: 1.02 mg/dL — ABNORMAL HIGH (ref 0.44–1.00)
GFR calc Af Amer: >60 mL/min (ref >60)
GFR, EST NON AFRICAN AMERICAN: 55 mL/min — AB (ref >60)
GLUCOSE: 104 mg/dL — AB (ref 65–99)
POTASSIUM: 4.3 mmol/L (ref 3.5–5.1)
Sodium: 153 mmol/L — ABNORMAL HIGH (ref 135–145)

## 2015-08-26 LAB — BLOOD GAS, ARTERIAL
Acid-Base Excess: 2.8 mmol/L — ABNORMAL HIGH (ref 0.0–2.0)
Bicarbonate: 27.4 mEq/L — ABNORMAL HIGH (ref 20.0–24.0)
Delivery systems: POSITIVE
Expiratory PAP: 8
FIO2: 1
INSPIRATORY PAP: 16
MODE: POSITIVE
O2 Saturation: 85.7 %
Patient temperature: 97.4
TCO2: 28.8 mmol/L (ref 0–100)
pCO2 arterial: 45.1 mmHg — ABNORMAL HIGH (ref 35.0–45.0)
pH, Arterial: 7.397 (ref 7.350–7.450)
pO2, Arterial: 52 mmHg — ABNORMAL LOW (ref 80.0–100.0)

## 2015-08-26 LAB — BASIC METABOLIC PANEL
Anion gap: 3 — ABNORMAL LOW (ref 5–15)
BUN: 18 mg/dL (ref 6–20)
CALCIUM: 10.1 mg/dL (ref 8.9–10.3)
CO2: 30 mmol/L (ref 22–32)
CREATININE: 0.84 mg/dL (ref 0.44–1.00)
Chloride: 116 mmol/L — ABNORMAL HIGH (ref 101–111)
GFR calc Af Amer: >60 mL/min (ref >60)
Glucose, Bld: 79 mg/dL (ref 65–99)
Potassium: 4.7 mmol/L (ref 3.5–5.1)
SODIUM: 149 mmol/L — AB (ref 135–145)

## 2015-08-27 ENCOUNTER — Other Ambulatory Visit (HOSPITAL_COMMUNITY): Payer: Medicare Other

## 2015-08-27 LAB — COMPREHENSIVE METABOLIC PANEL
ALK PHOS: 78 U/L (ref 38–126)
ALT: 22 U/L (ref 14–54)
AST: 51 U/L — AB (ref 15–41)
Albumin: 1.9 g/dL — ABNORMAL LOW (ref 3.5–5.0)
Anion gap: 6 (ref 5–15)
BILIRUBIN TOTAL: 0.9 mg/dL (ref 0.3–1.2)
BUN: 21 mg/dL — AB (ref 6–20)
CALCIUM: 8.3 mg/dL — AB (ref 8.9–10.3)
CHLORIDE: 124 mmol/L — AB (ref 101–111)
CO2: 21 mmol/L — ABNORMAL LOW (ref 22–32)
CREATININE: 1.38 mg/dL — AB (ref 0.44–1.00)
GFR, EST AFRICAN AMERICAN: 44 mL/min — AB (ref >60)
GFR, EST NON AFRICAN AMERICAN: 38 mL/min — AB (ref >60)
Glucose, Bld: 493 mg/dL — ABNORMAL HIGH (ref 65–99)
Potassium: 4.2 mmol/L (ref 3.5–5.1)
Sodium: 151 mmol/L — ABNORMAL HIGH (ref 135–145)
TOTAL PROTEIN: 3.8 g/dL — AB (ref 6.5–8.1)

## 2015-08-27 LAB — CBC WITH DIFFERENTIAL/PLATELET
Basophils Absolute: 0 10*3/uL (ref 0.0–0.1)
Basophils Relative: 0 %
EOS PCT: 0 %
Eosinophils Absolute: 0 10*3/uL (ref 0.0–0.7)
HEMATOCRIT: 22.4 % — AB (ref 36.0–46.0)
Hemoglobin: 6.9 g/dL — CL (ref 12.0–15.0)
LYMPHS ABS: 2.5 10*3/uL (ref 0.7–4.0)
LYMPHS PCT: 25 %
MCH: 30.4 pg (ref 26.0–34.0)
MCHC: 30.8 g/dL (ref 30.0–36.0)
MCV: 98.7 fL (ref 78.0–100.0)
Monocytes Absolute: 0.7 10*3/uL (ref 0.1–1.0)
Monocytes Relative: 7 %
NEUTROS ABS: 6.7 10*3/uL (ref 1.7–7.7)
Neutrophils Relative %: 68 %
PLATELETS: 77 10*3/uL — AB (ref 150–400)
RBC: 2.27 MIL/uL — AB (ref 3.87–5.11)
RDW: 17 % — ABNORMAL HIGH (ref 11.5–15.5)
WBC: 10 10*3/uL (ref 4.0–10.5)

## 2015-08-27 LAB — PREPARE RBC (CROSSMATCH)

## 2015-08-27 LAB — ABO/RH: ABO/RH(D): B NEG

## 2015-08-28 LAB — LACTIC ACID, PLASMA: Lactic Acid, Venous: 6.5 mmol/L (ref 0.5–2.0)

## 2015-08-29 ENCOUNTER — Other Ambulatory Visit: Payer: PRIVATE HEALTH INSURANCE

## 2015-08-29 LAB — TYPE AND SCREEN
ABO/RH(D): B NEG
ANTIBODY SCREEN: NEGATIVE
UNIT DIVISION: 0
Unit division: 0

## 2015-09-05 ENCOUNTER — Ambulatory Visit: Payer: PRIVATE HEALTH INSURANCE | Admitting: Internal Medicine

## 2015-09-08 DEATH — deceased

## 2015-12-01 IMAGING — DX DG CHEST 1V PORT
1 series · 1 of 1 positions shown · non-contrast
Comparison: May 12, 2015

CLINICAL DATA: Hypoxia

EXAM:
PORTABLE CHEST - 1 VIEW

[chest ap]
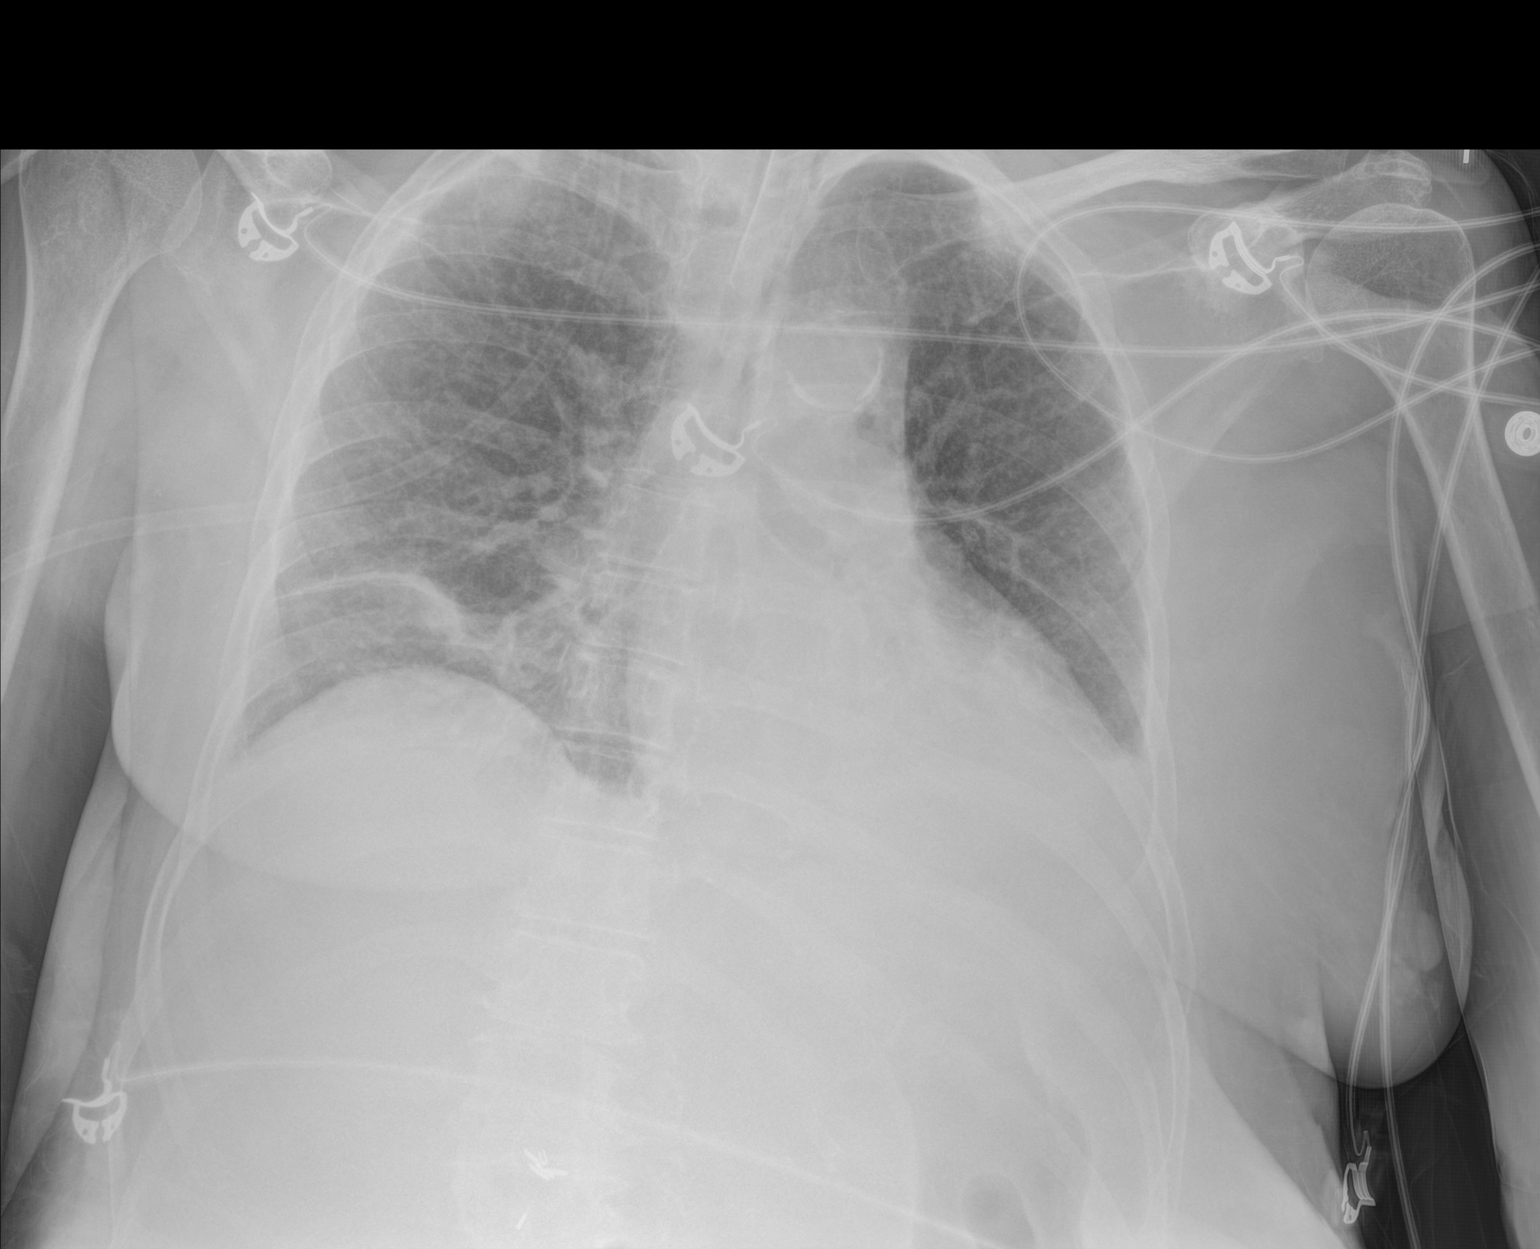

[1 of 1 positions shown; findings below may reference images not displayed]

FINDINGS: Endotracheal tube tip is 4.5 cm above the carina. There is
atelectatic change in the lung bases. Lungs elsewhere clear. Heart
size and pulmonary vascularity are normal. No adenopathy. There is
atherosclerotic change in the aorta.
IMPRESSION: Bibasilar atelectasis. Endotracheal tube as described without
pneumothorax.

## 2015-12-03 IMAGING — DX DG CHEST 1V PORT
1 series · 1 of 1 positions shown · non-contrast
Comparison: July 05, 2015.

CLINICAL DATA: Respiratory failure.

EXAM:
PORTABLE CHEST - 1 VIEW

[chest ap]
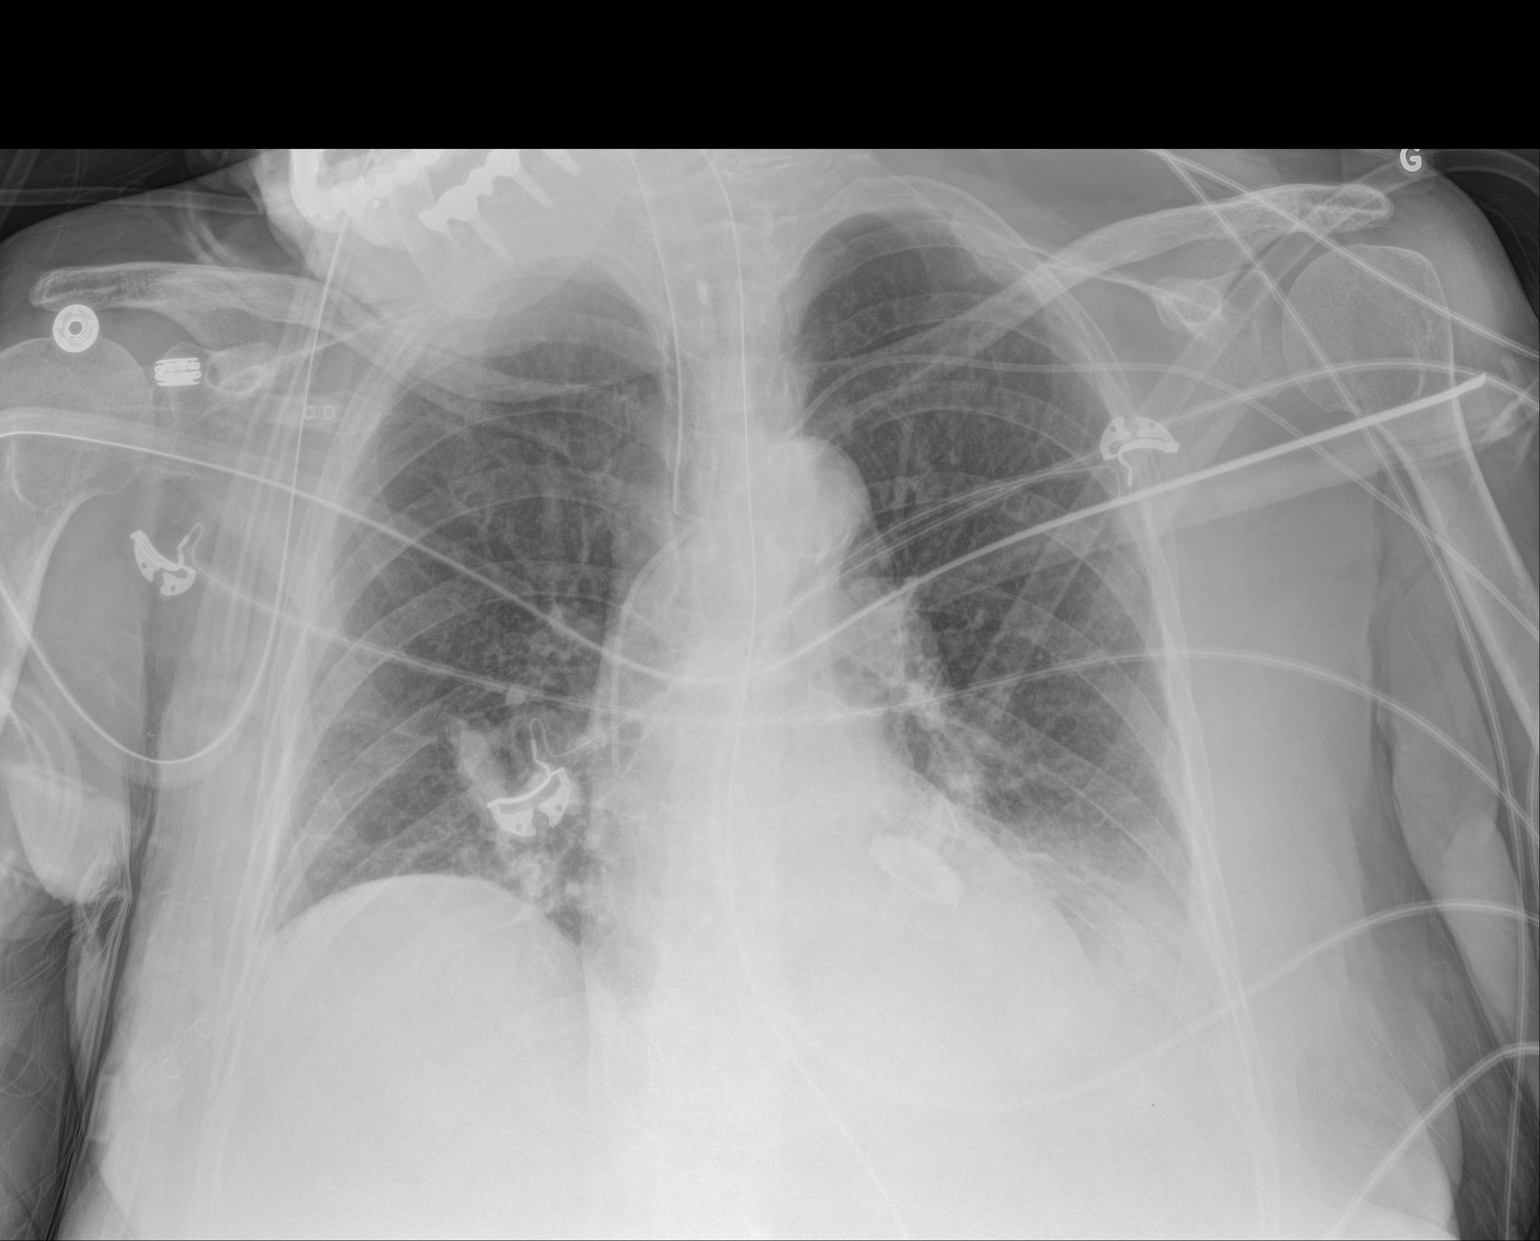

[1 of 1 positions shown; findings below may reference images not displayed]

FINDINGS: Stable cardiomediastinal silhouette. Endotracheal tube is in grossly
good position with distal tip approximately 3 cm above the carina.
Nasogastric tube is seen entering the stomach. Left-sided PICC line
is noted with distal tip overlying the expected position of the SVC.
No pneumothorax is noted. Mild right basilar subsegmental
atelectasis is noted. Stable left basilar opacity is noted
concerning for pneumonia or atelectasis with associated pleural
effusion. Bony thorax is intact.
IMPRESSION: Endotracheal and nasogastric tubes are in grossly good position.
Mild right basilar subsegmental atelectasis noted. Stable larger
left basilar opacity is noted concerning for pneumonia or
atelectasis with associated effusion.

## 2015-12-19 ENCOUNTER — Ambulatory Visit: Payer: Medicare Other | Admitting: Vascular Surgery

## 2015-12-19 ENCOUNTER — Encounter (HOSPITAL_COMMUNITY): Payer: Medicare Other

## 2016-01-17 IMAGING — CR DG CHEST 1V PORT
1 series · 1 of 1 positions shown · non-contrast
Comparison: Chest radiograph obtained yesterday at 3330 hour

CLINICAL DATA: Pneumonia.

EXAM:
PORTABLE CHEST - 1 VIEW

[AP]
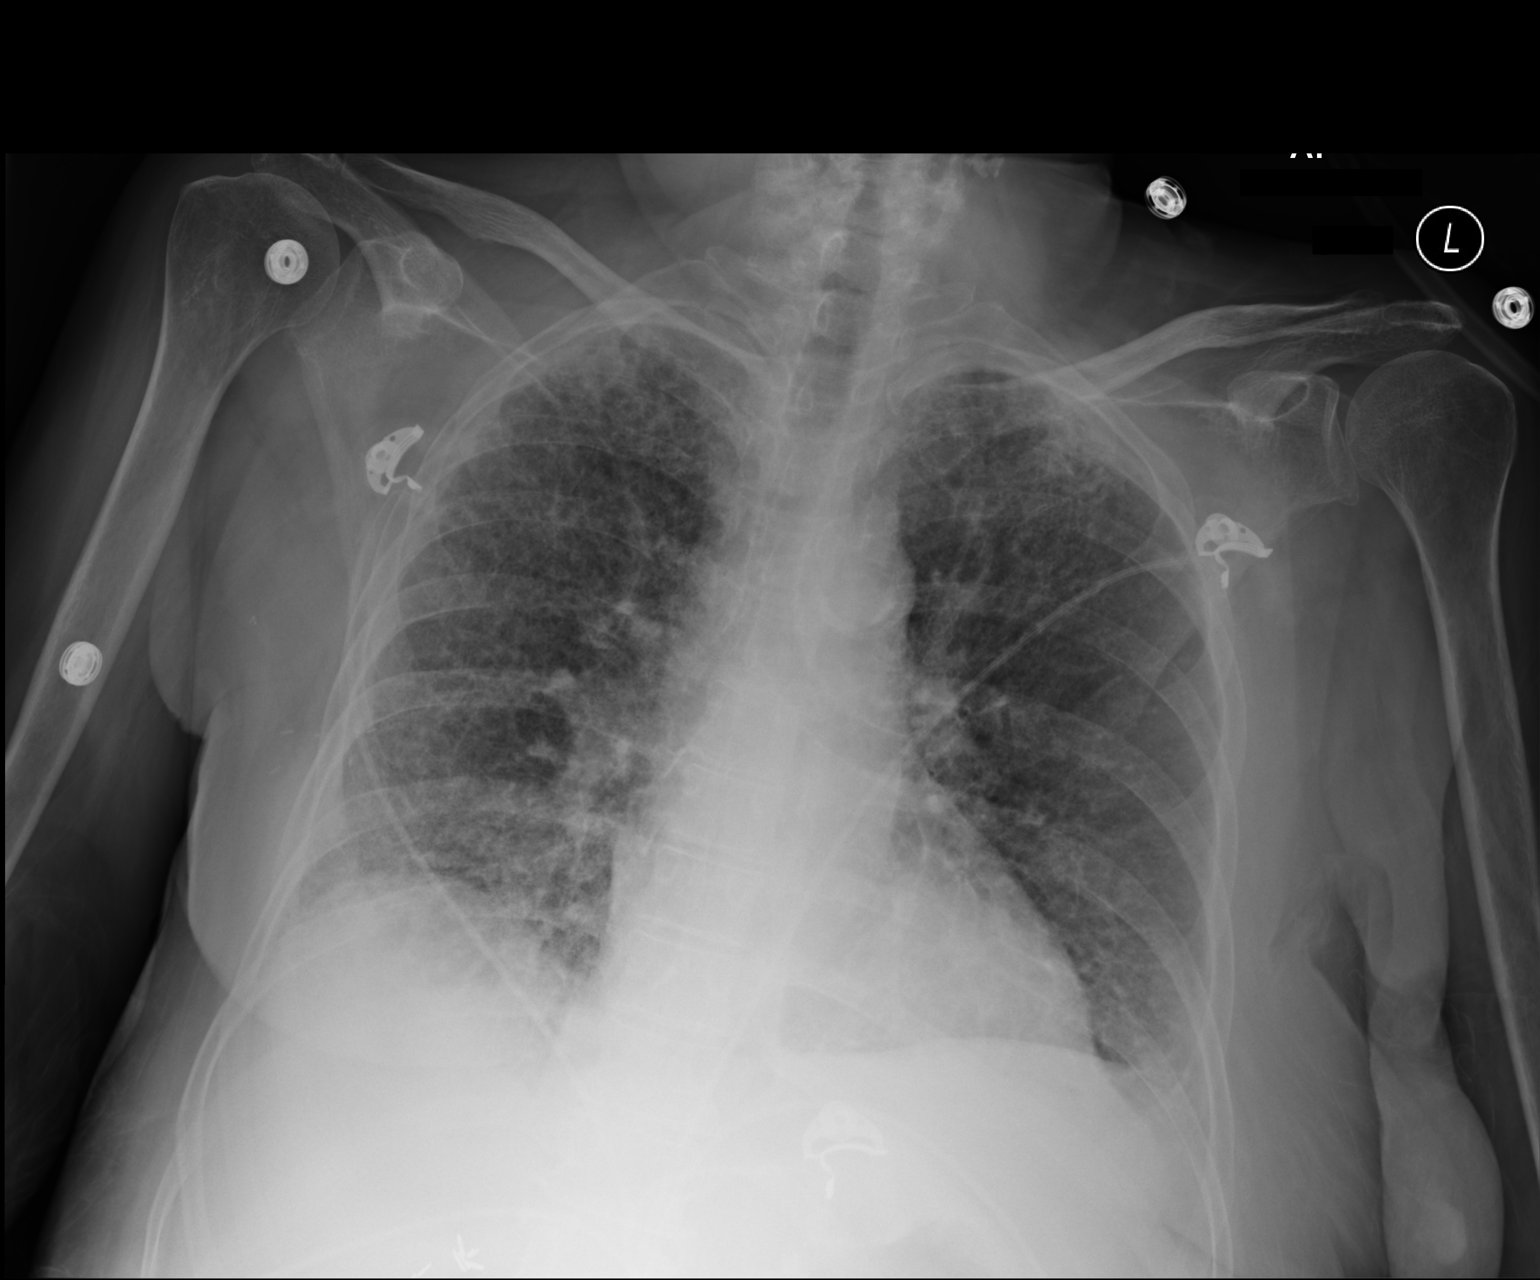

[1 of 1 positions shown; findings below may reference images not displayed]

FINDINGS: Improved lung aeration from prior with persistent but improved
diffuse interstitial prominence. The heart size is normal. There is
atherosclerosis of the thoracic aorta. Persistent blunting of both
costophrenic angles. No pneumothorax.
IMPRESSION: Improving lung aeration with persistent but improved interstitial
prominence, may reflect improving edema or pneumonia. Overall
improved radiographic appearance of the chest.

## 2016-01-18 IMAGING — CR DG ABD PORTABLE 1V
1 series · 1 of 1 positions shown · non-contrast
Comparison: CT of the abdomen dated 05/12/2015

CLINICAL DATA: NG tube placement.

EXAM:
PORTABLE ABDOMEN - 1 VIEW

[AP]
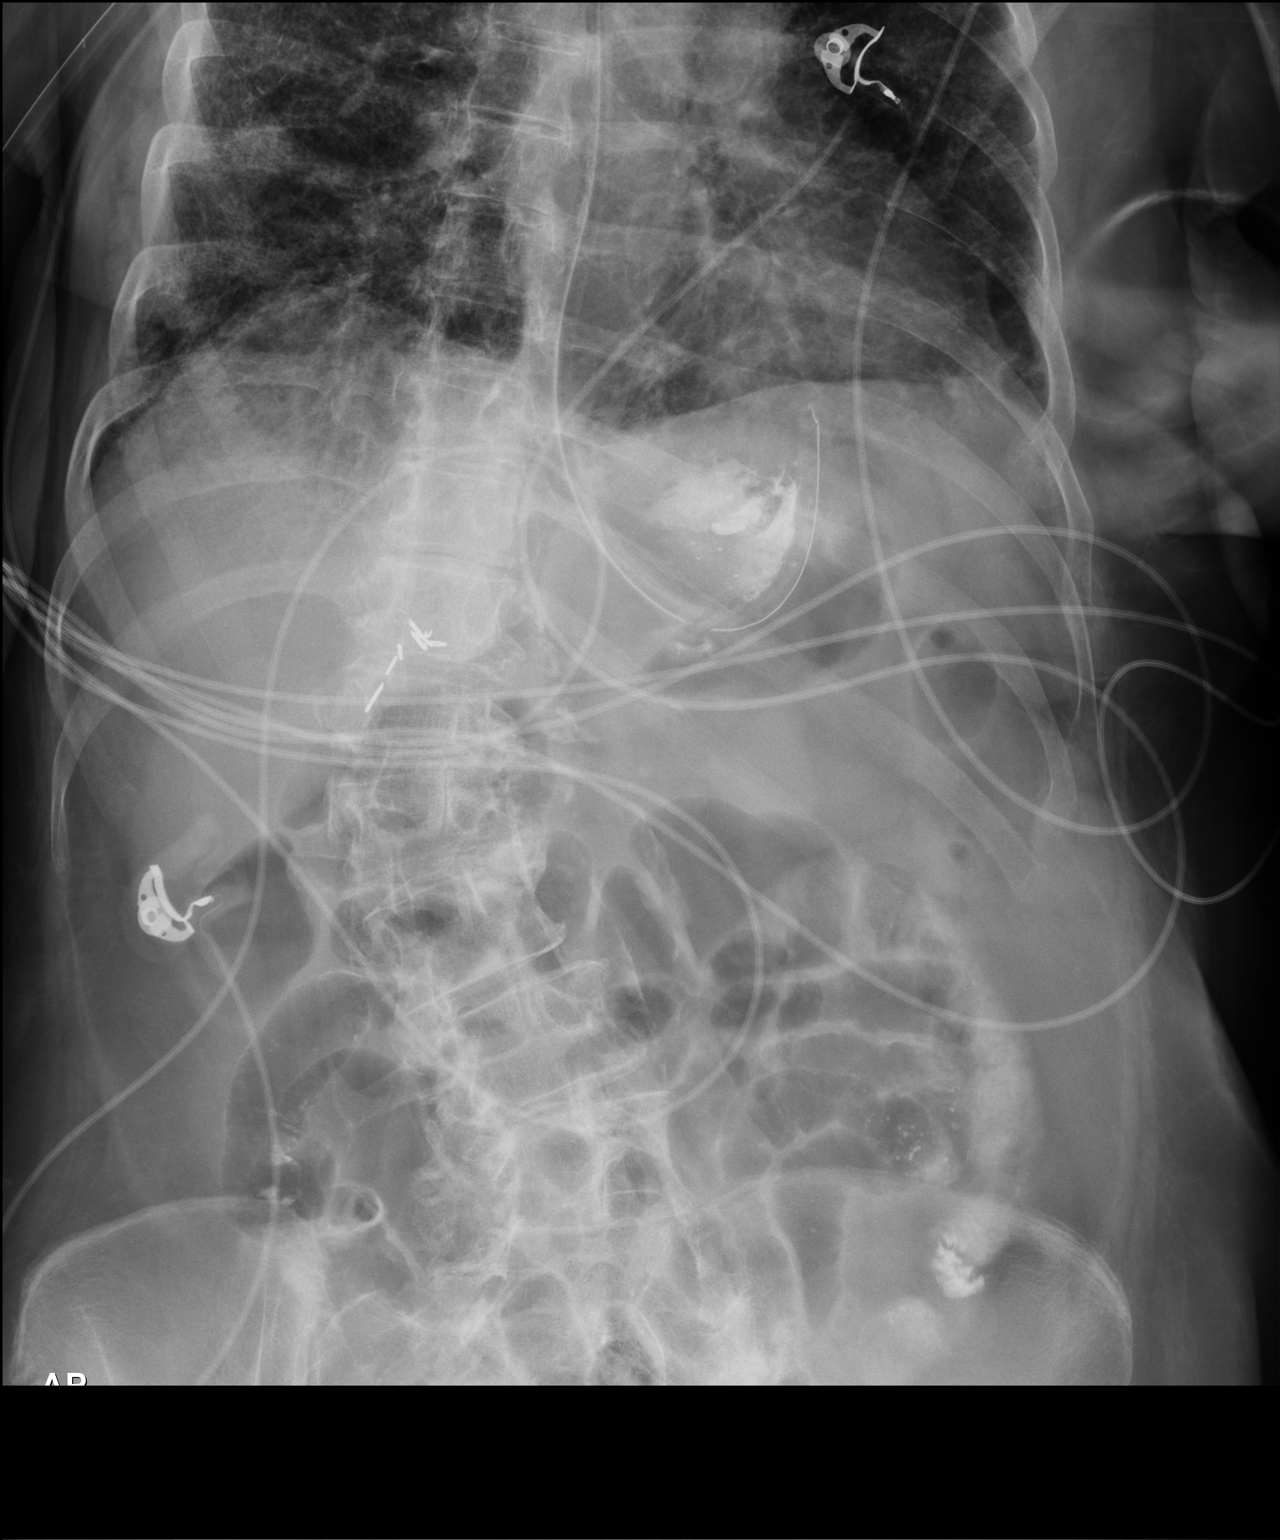

[1 of 1 positions shown; findings below may reference images not displayed]

FINDINGS: There is a pathology gaseous distention of multiple small bowel
loops. Residual high density material is seen within the stomach,
and left colon. Enteric catheter overlies the expected location of
gastric body with tip at the level of gastric cardia. No evidence of
free intra-abdominal gas. Postsurgical changes in the right upper
quadrant are seen. Dextro convex scoliosis of the thoraco lumbar
spine and associated osteoarthritic changes are noted.
IMPRESSION: Enteric catheter likely within the proximal stomach.

Nonspecific bowel gas pattern with diffuse mild gaseous distention
of small bowel loops.

## 2016-01-18 IMAGING — CR DG CHEST 1V PORT
1 series · 1 of 1 positions shown · non-contrast
Comparison: 08/09/15

CLINICAL DATA: PICC line placement

EXAM:
PORTABLE CHEST - 1 VIEW

[AP]
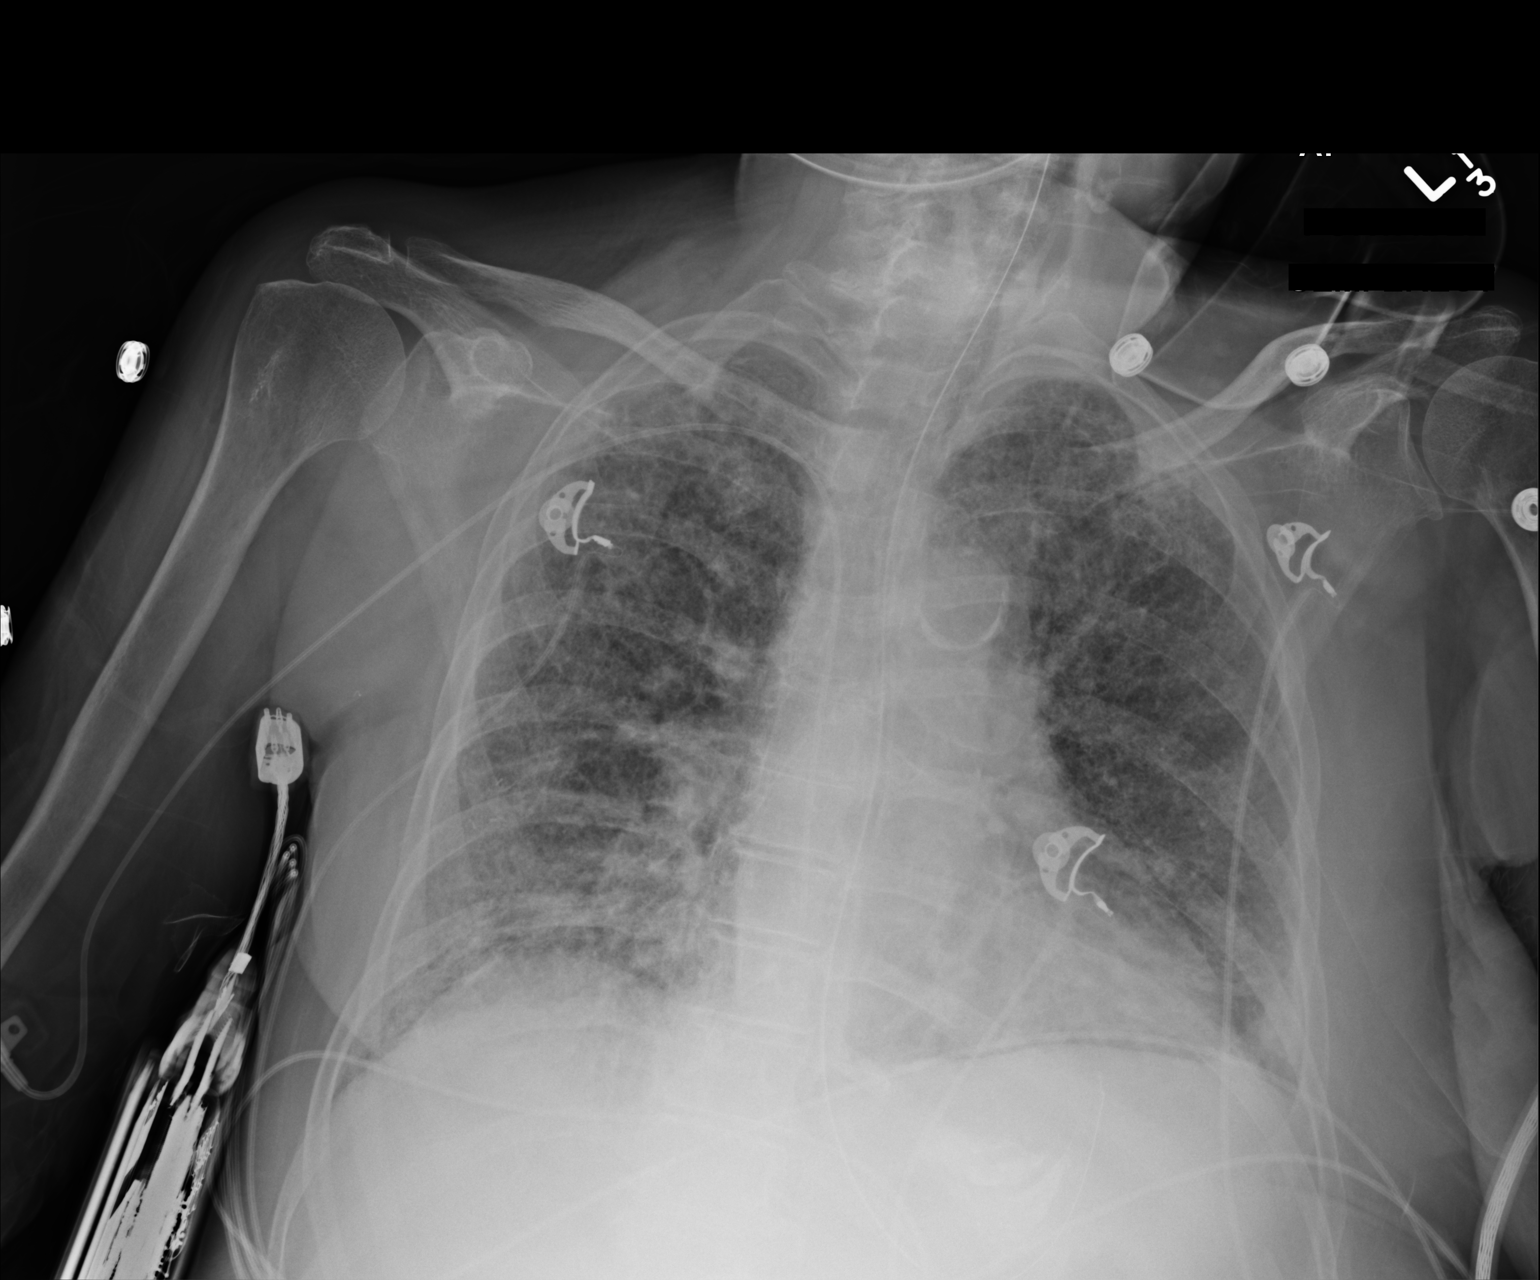

[1 of 1 positions shown; findings below may reference images not displayed]

FINDINGS: PICC tip is 4.5cm into right atrium and should be retracted about
5cm with repeat imaging. NG tube crosses into the stomach. Heavy
bilateral interstitial change similar to prior radiograph. No
pneumothorax.
IMPRESSION: PICC line as described. These results will be called to the ordering
clinician or representative by the Radiologist Assistant, and
communication documented in the PACS or zVision Dashboard.

## 2016-12-11 NOTE — Therapy (Signed)
[facility] [address] [place], [place], [address] Phone: [phone]   Fax:  [phone]  Physical Therapy Treatment  Patient Details  Name: Theresa [name] MRN: [id number] Date of Birth: 11/20/1946 No Data Recorded  Encounter Date: 04/05/2015    Past Medical History:  Diagnosis Date  . Alcohol abuse   . Anxiety   . Atherosclerosis   . Chronic respiratory failure   . Colitis   . Diaphragmatic hernia without mention of obstruction or gangrene   . DVT (deep venous thrombosis) 06/2014   RLE  . Dysthymic disorder   . Esophageal stricture   . GERD (gastroesophageal reflux disease)   . History of hiatal hernia   . HLD (hyperlipidemia)   . Malignant neoplasm of breast (female), unspecified site 2002   "right", NO BLOOD PRESSURES OR STICKS IN RIGHT ARM  . On home oxygen therapy    "2L at night and prn" (11/08/2014)  . Other and unspecified coagulation defects   . PAD (peripheral artery disease)   . Pulmonary embolism 06/2014   "both lungs"  . SVT (supraventricular tachycardia) 11/07/2014    Past Surgical History:  Procedure Laterality Date  . APPENDECTOMY  12/1977  . BREAST LUMPECTOMY Right 01/2001  . CARDIAC CATHETERIZATION  1990's  . CHOLECYSTECTOMY  12/1977  . ESOPHAGOGASTRODUODENOSCOPY N/A 06/06/2015   Procedure: ESOPHAGOGASTRODUODENOSCOPY (EGD);  Surgeon: Inda Castle, MD;  Location: [facility];  Service: Endoscopy;  Laterality: N/A;  . ESOPHAGOGASTRODUODENOSCOPY (EGD) WITH ESOPHAGEAL DILATION  X 7  . TUBAL LIGATION  1980's    There were no vitals filed for this visit.   Note was created in error                              PT Short Term Goals - 03/26/15 1714      PT SHORT TERM GOAL #1   Title Pt. will improve Berg score to >/=  28 to demo imporved standing balance.  (03-23-15)   Baseline 31/56 on 03-26-15   Status Achieved     PT SHORT TERM GOAL #2   Title Improve  TUG score to </= 25 secs to demo imrpoved functional mobility     (03-23-15)   Baseline 20.28 secs with RW; 18.00 secs with RW   Status Achieved     PT SHORT TERM GOAL #3   Title Incr. gait velocity to >/= 2.0 ft/sec with RW for incr. gait efficiency  (03-23-15)   Baseline 12.69; 12.25    2.58 ft/sec with RW 03-26-15   Status Achieved     PT SHORT TERM GOAL #4   Title Independent in HEP for balance and strengthening  (03-23-15)   Baseline pt. not performing consistently 03-26-15   Status On-going           PT Long Term Goals - 04/27/15 1012      PT LONG TERM GOAL #1   Title Incr. Berg balance test score to >/= 36/56 to decr. fall risk  (05-24-15)   Baseline 31/56 on 03-26-15   Time 4   Period Weeks   Status On-going     PT LONG TERM GOAL #2   Title Improve TUG score to </= 20 secs with RW for improved functional mobility  (04-22-15)   Baseline 18 secs with RW  on 03-26-15   Time 4   Period Weeks   Status On-going     PT LONG  TERM GOAL #3   Title Incr. gait velocity to >/= 2.3 ft/sec with RW for incr. gait efficiency  (04-22-15)   Baseline 2.58 ft/sec with RW on 03-26-15   Status Achieved     PT LONG TERM GOAL #4   Title Increase FOTO score to >/=60/100 to demo imrpoved pt satisfaction  (04-22-15)   Baseline 45/100   Time 4   Period Weeks   Status On-going     PT LONG TERM GOAL #5   Title Amb. household amb. with cane modified independently   Baseline pt using RW as of 04-26-15   Time 4   Period Weeks   Status New             Patient will benefit from skilled therapeutic intervention in order to improve the following deficits and impairments:     Visit Diagnosis: Abnormality of gait     Problem List Patient Active Problem List   Diagnosis Date Noted  . Pressure ulcer 07/07/2015  . Malnutrition of moderate degree ([place]) 07/05/2015  . Coagulopathy ([place]) 07/04/2015  . Encephalopathy acute 07/04/2015  . Acute alcoholic hepatitis [id number]  . Acute  respiratory failure ([place])   . Alcohol withdrawal ([place]) 07/03/2015  . Hepatic coma/encephalopathy ([place]) 07/03/2015  . Hyperammonemia ([place]) 07/03/2015  . Acute encephalopathy 07/03/2015  . DOE (dyspnea on exertion) 05/01/2015  . PAD (peripheral artery disease) ([place]) 05/01/2015  . Dyspnea and respiratory abnormality 01/04/2015  . Chronic nausea 12/27/2014  . Cough 12/27/2014  . Hypoxia 11/08/2014  . Dyspnea 11/08/2014  . Hematemesis 11/08/2014  . History of pulmonary embolism 11/08/2014  . Alcohol abuse 11/08/2014  . SVT (supraventricular tachycardia) ([place]) 11/08/2014  . Acute pulmonary embolus ([place]) 10/25/2014  . Nausea with vomiting 09/12/2014  . Chronic respiratory failure ([place]) 06/17/2014  . Elevated TSH 06/17/2014  . Hypokalemia 06/17/2014  . Pulmonary embolus ([place]) 06/15/2014  . Acute respiratory failure with hypoxia ([place]) 06/15/2014  . Chronic venous insufficiency 10/26/2013  . Closed fracture of 5th metacarpal 05/02/2013  . Fall 05/02/2013  . Atherosclerosis of native arteries of extremity with intermittent claudication ([place]) 10/20/2012  . Sepsis secondary to pyelonphritis / gram negative rod (E. Coli) bacteremia 06/23/2012  . Leukocytosis 06/22/2012  . COLITIS 06/20/2010  . Malignant neoplasm of female breast ([place]) 02/05/2009  . HYPERLIPIDEMIA 02/05/2009  . ANXIETY DEPRESSION 02/05/2009  . ESOPHAGEAL STRICTURE 02/05/2009  . GERD 02/05/2009    Alda Lea, PT 12/11/2016, 1:13 PM  [facility] [address] [place], [place], [address] Phone: [phone]   Fax:  [phone]  Name: Theresa [name] MRN: [id number] Date of Birth: 1946-06-28
